# Patient Record
Sex: Female | Born: 1960 | Race: White | Smoking: Never smoker
Health system: Northeastern US, Academic
[De-identification: ages and names within clinical notes are randomized; demographics above are authoritative.]

## PROBLEM LIST (undated history)

## (undated) DIAGNOSIS — D649 Anemia, unspecified: Secondary | ICD-10-CM

## (undated) DIAGNOSIS — M1990 Unspecified osteoarthritis, unspecified site: Secondary | ICD-10-CM

## (undated) DIAGNOSIS — G4730 Sleep apnea, unspecified: Secondary | ICD-10-CM

## (undated) DIAGNOSIS — F419 Anxiety disorder, unspecified: Secondary | ICD-10-CM

## (undated) DIAGNOSIS — F32A Depression, unspecified: Secondary | ICD-10-CM

## (undated) DIAGNOSIS — K219 Gastro-esophageal reflux disease without esophagitis: Secondary | ICD-10-CM

## (undated) DIAGNOSIS — Z9989 Dependence on other enabling machines and devices: Secondary | ICD-10-CM

## (undated) DIAGNOSIS — K900 Celiac disease: Secondary | ICD-10-CM

## (undated) DIAGNOSIS — F319 Bipolar disorder, unspecified: Secondary | ICD-10-CM

## (undated) DIAGNOSIS — J189 Pneumonia, unspecified organism: Secondary | ICD-10-CM

## (undated) DIAGNOSIS — G43909 Migraine, unspecified, not intractable, without status migrainosus: Secondary | ICD-10-CM

## (undated) DIAGNOSIS — R87619 Unspecified abnormal cytological findings in specimens from cervix uteri: Secondary | ICD-10-CM

## (undated) DIAGNOSIS — F1910 Other psychoactive substance abuse, uncomplicated: Secondary | ICD-10-CM

## (undated) DIAGNOSIS — L709 Acne, unspecified: Secondary | ICD-10-CM

## (undated) DIAGNOSIS — K589 Irritable bowel syndrome without diarrhea: Secondary | ICD-10-CM

## (undated) DIAGNOSIS — G4700 Insomnia, unspecified: Secondary | ICD-10-CM

## (undated) HISTORY — DX: Unspecified abnormal cytological findings in specimens from cervix uteri: R87.619

## (undated) HISTORY — DX: Gastro-esophageal reflux disease without esophagitis: K21.9

## (undated) HISTORY — DX: Other psychoactive substance abuse, uncomplicated: F19.10

## (undated) HISTORY — DX: Bipolar disorder, unspecified: F31.9

## (undated) HISTORY — DX: Migraine, unspecified, not intractable, without status migrainosus: G43.909

## (undated) HISTORY — PX: BREAST ENHANCEMENT SURGERY: SHX7

## (undated) HISTORY — DX: Insomnia, unspecified: G47.00

## (undated) HISTORY — DX: Anemia, unspecified: D64.9

## (undated) HISTORY — DX: Irritable bowel syndrome, unspecified: K58.9

## (undated) HISTORY — DX: Anxiety disorder, unspecified: F41.9

## (undated) HISTORY — DX: Acne, unspecified: L70.9

## (undated) HISTORY — PX: BASAL CELL CARCINOMA EXCISION: SHX1214

## (undated) HISTORY — DX: Pneumonia, unspecified organism: J18.9

## (undated) HISTORY — DX: Depression, unspecified: F32.A

## (undated) HISTORY — DX: Celiac disease: K90.0

## (undated) HISTORY — PX: SPINE SURGERY: SHX786

## (undated) HISTORY — PX: EYE SURGERY: SHX253

## (undated) HISTORY — DX: Unspecified osteoarthritis, unspecified site: M19.90

---

## 1998-11-21 HISTORY — PX: BREAST ENHANCEMENT SURGERY: SHX7

## 2003-03-07 ENCOUNTER — Other Ambulatory Visit: Admission: RE | Admit: 2003-03-07 | Discharge: 2003-03-07 | Payer: Self-pay | Admitting: *Deleted

## 2004-03-28 ENCOUNTER — Other Ambulatory Visit: Admission: RE | Admit: 2004-03-28 | Discharge: 2004-03-28 | Payer: Self-pay | Admitting: *Deleted

## 2004-07-31 ENCOUNTER — Encounter: Admission: RE | Admit: 2004-07-31 | Discharge: 2004-07-31 | Payer: Self-pay | Admitting: *Deleted

## 2004-08-13 ENCOUNTER — Ambulatory Visit: Payer: Self-pay | Admitting: Licensed Clinical Social Worker

## 2004-08-18 ENCOUNTER — Ambulatory Visit: Payer: Self-pay | Admitting: Licensed Clinical Social Worker

## 2004-08-28 ENCOUNTER — Ambulatory Visit: Payer: Self-pay | Admitting: Licensed Clinical Social Worker

## 2004-09-01 ENCOUNTER — Ambulatory Visit: Payer: Self-pay | Admitting: Licensed Clinical Social Worker

## 2004-09-10 ENCOUNTER — Ambulatory Visit: Payer: Self-pay | Admitting: Licensed Clinical Social Worker

## 2004-09-11 ENCOUNTER — Ambulatory Visit: Payer: Self-pay | Admitting: Family Medicine

## 2004-09-15 ENCOUNTER — Ambulatory Visit: Payer: Self-pay | Admitting: Licensed Clinical Social Worker

## 2004-09-25 ENCOUNTER — Ambulatory Visit: Payer: Self-pay | Admitting: Licensed Clinical Social Worker

## 2004-10-06 ENCOUNTER — Ambulatory Visit: Payer: Self-pay | Admitting: Licensed Clinical Social Worker

## 2004-10-14 ENCOUNTER — Ambulatory Visit: Payer: Self-pay | Admitting: Licensed Clinical Social Worker

## 2004-10-20 ENCOUNTER — Ambulatory Visit: Payer: Self-pay | Admitting: Licensed Clinical Social Worker

## 2004-10-30 ENCOUNTER — Ambulatory Visit: Payer: Self-pay | Admitting: Licensed Clinical Social Worker

## 2004-11-05 ENCOUNTER — Ambulatory Visit: Payer: Self-pay | Admitting: Licensed Clinical Social Worker

## 2004-11-10 ENCOUNTER — Ambulatory Visit: Payer: Self-pay | Admitting: Licensed Clinical Social Worker

## 2004-11-10 ENCOUNTER — Ambulatory Visit: Payer: Self-pay | Admitting: Family Medicine

## 2004-11-13 ENCOUNTER — Ambulatory Visit: Payer: Self-pay | Admitting: Family Medicine

## 2004-11-13 ENCOUNTER — Ambulatory Visit: Payer: Self-pay | Admitting: Licensed Clinical Social Worker

## 2004-11-18 ENCOUNTER — Ambulatory Visit: Payer: Self-pay | Admitting: Licensed Clinical Social Worker

## 2004-11-20 ENCOUNTER — Ambulatory Visit: Payer: Self-pay | Admitting: Licensed Clinical Social Worker

## 2004-11-25 ENCOUNTER — Ambulatory Visit: Payer: Self-pay | Admitting: Licensed Clinical Social Worker

## 2004-12-02 ENCOUNTER — Ambulatory Visit: Payer: Self-pay | Admitting: Licensed Clinical Social Worker

## 2004-12-08 ENCOUNTER — Ambulatory Visit: Payer: Self-pay | Admitting: Licensed Clinical Social Worker

## 2004-12-16 ENCOUNTER — Ambulatory Visit: Payer: Self-pay | Admitting: Licensed Clinical Social Worker

## 2005-01-06 ENCOUNTER — Ambulatory Visit: Payer: Self-pay | Admitting: Licensed Clinical Social Worker

## 2005-01-15 ENCOUNTER — Ambulatory Visit: Payer: Self-pay | Admitting: Licensed Clinical Social Worker

## 2005-01-19 ENCOUNTER — Ambulatory Visit: Payer: Self-pay | Admitting: Family Medicine

## 2005-01-20 ENCOUNTER — Encounter: Admission: RE | Admit: 2005-01-20 | Discharge: 2005-01-20 | Payer: Self-pay | Admitting: Family Medicine

## 2005-01-26 ENCOUNTER — Ambulatory Visit: Payer: Self-pay | Admitting: Licensed Clinical Social Worker

## 2005-02-04 ENCOUNTER — Encounter: Admission: RE | Admit: 2005-02-04 | Discharge: 2005-02-04 | Payer: Self-pay | Admitting: Family Medicine

## 2005-02-12 ENCOUNTER — Ambulatory Visit: Payer: Self-pay | Admitting: Licensed Clinical Social Worker

## 2005-02-25 ENCOUNTER — Ambulatory Visit: Payer: Self-pay | Admitting: Family Medicine

## 2005-02-26 ENCOUNTER — Ambulatory Visit: Payer: Self-pay | Admitting: Licensed Clinical Social Worker

## 2005-04-02 ENCOUNTER — Ambulatory Visit: Payer: Self-pay | Admitting: Licensed Clinical Social Worker

## 2005-04-21 ENCOUNTER — Ambulatory Visit: Payer: Self-pay | Admitting: Family Medicine

## 2005-09-10 ENCOUNTER — Other Ambulatory Visit: Admission: RE | Admit: 2005-09-10 | Discharge: 2005-09-10 | Payer: Self-pay | Admitting: Obstetrics and Gynecology

## 2005-10-13 ENCOUNTER — Encounter: Admission: RE | Admit: 2005-10-13 | Discharge: 2005-10-13 | Payer: Self-pay | Admitting: Obstetrics & Gynecology

## 2005-12-09 ENCOUNTER — Ambulatory Visit: Payer: Self-pay | Admitting: Family Medicine

## 2005-12-11 ENCOUNTER — Encounter: Admission: RE | Admit: 2005-12-11 | Discharge: 2005-12-31 | Payer: Self-pay | Admitting: Family Medicine

## 2006-06-07 ENCOUNTER — Ambulatory Visit: Payer: Self-pay | Admitting: Family Medicine

## 2006-07-20 ENCOUNTER — Ambulatory Visit: Payer: Self-pay | Admitting: Family Medicine

## 2006-07-20 LAB — CONVERTED CEMR LAB
Basophils Relative: 0.6 % (ref 0.0–1.0)
CO2: 27 meq/L (ref 19–32)
Calcium: 9.3 mg/dL (ref 8.4–10.5)
Chloride: 109 meq/L (ref 96–112)
Eosinophils Absolute: 0.1 10*3/uL (ref 0.0–0.6)
GFR calc Af Amer: 78 mL/min
GFR calc non Af Amer: 64 mL/min
Glucose, Bld: 99 mg/dL (ref 70–99)
Hemoglobin: 10.5 g/dL — ABNORMAL LOW (ref 12.0–15.0)
Lymphocytes Relative: 33 % (ref 12.0–46.0)
MCV: 70.1 fL — ABNORMAL LOW (ref 78.0–100.0)
Platelets: 262 10*3/uL (ref 150–400)
RDW: 16.1 % — ABNORMAL HIGH (ref 11.5–14.6)
Sodium: 142 meq/L (ref 135–145)
WBC: 6.2 10*3/uL (ref 4.5–10.5)

## 2006-08-10 ENCOUNTER — Ambulatory Visit: Payer: Self-pay | Admitting: Family Medicine

## 2006-08-24 ENCOUNTER — Ambulatory Visit: Payer: Self-pay | Admitting: Gastroenterology

## 2006-09-07 ENCOUNTER — Ambulatory Visit: Payer: Self-pay | Admitting: Gastroenterology

## 2006-09-07 HISTORY — PX: ESOPHAGOGASTRODUODENOSCOPY: SHX1529

## 2007-03-29 DIAGNOSIS — F411 Generalized anxiety disorder: Secondary | ICD-10-CM | POA: Insufficient documentation

## 2007-03-29 DIAGNOSIS — F32A Depression, unspecified: Secondary | ICD-10-CM | POA: Insufficient documentation

## 2007-03-29 DIAGNOSIS — J45909 Unspecified asthma, uncomplicated: Secondary | ICD-10-CM | POA: Insufficient documentation

## 2007-03-29 DIAGNOSIS — F3289 Other specified depressive episodes: Secondary | ICD-10-CM | POA: Insufficient documentation

## 2007-03-29 DIAGNOSIS — D649 Anemia, unspecified: Secondary | ICD-10-CM

## 2007-03-29 DIAGNOSIS — F329 Major depressive disorder, single episode, unspecified: Secondary | ICD-10-CM

## 2007-03-29 DIAGNOSIS — J309 Allergic rhinitis, unspecified: Secondary | ICD-10-CM | POA: Insufficient documentation

## 2007-03-29 DIAGNOSIS — K219 Gastro-esophageal reflux disease without esophagitis: Secondary | ICD-10-CM

## 2007-05-10 ENCOUNTER — Ambulatory Visit: Payer: Self-pay | Admitting: Family Medicine

## 2007-05-10 LAB — CONVERTED CEMR LAB
ALT: 19 units/L (ref 0–35)
BUN: 15 mg/dL (ref 6–23)
Bilirubin, Direct: 0.1 mg/dL (ref 0.0–0.3)
CO2: 27 meq/L (ref 19–32)
Calcium: 8.8 mg/dL (ref 8.4–10.5)
Chloride: 104 meq/L (ref 96–112)
Creatinine, Ser: 0.8 mg/dL (ref 0.4–1.2)
Eosinophils Relative: 1.1 % (ref 0.0–5.0)
Hemoglobin: 11.9 g/dL — ABNORMAL LOW (ref 12.0–15.0)
LDL Cholesterol: 122 mg/dL — ABNORMAL HIGH (ref 0–99)
Lymphocytes Relative: 32.2 % (ref 12.0–46.0)
Monocytes Relative: 7.8 % (ref 3.0–11.0)
Neutro Abs: 3.2 10*3/uL (ref 1.4–7.7)
Platelets: 238 10*3/uL (ref 150–400)
Potassium: 4.2 meq/L (ref 3.5–5.1)
RBC: 4.23 M/uL (ref 3.87–5.11)
Sodium: 139 meq/L (ref 135–145)
Total Protein: 5.9 g/dL — ABNORMAL LOW (ref 6.0–8.3)
Triglycerides: 146 mg/dL (ref 0–149)

## 2007-05-24 ENCOUNTER — Ambulatory Visit: Payer: Self-pay | Admitting: Family Medicine

## 2007-06-02 ENCOUNTER — Telehealth: Payer: Self-pay | Admitting: Family Medicine

## 2007-07-25 ENCOUNTER — Other Ambulatory Visit: Admission: RE | Admit: 2007-07-25 | Discharge: 2007-07-25 | Payer: Self-pay | Admitting: Obstetrics and Gynecology

## 2007-09-07 ENCOUNTER — Ambulatory Visit: Payer: Self-pay | Admitting: Family Medicine

## 2007-09-07 DIAGNOSIS — L989 Disorder of the skin and subcutaneous tissue, unspecified: Secondary | ICD-10-CM | POA: Insufficient documentation

## 2008-03-23 ENCOUNTER — Telehealth: Payer: Self-pay | Admitting: Family Medicine

## 2008-07-24 ENCOUNTER — Ambulatory Visit: Payer: Self-pay | Admitting: Family Medicine

## 2008-07-24 DIAGNOSIS — J019 Acute sinusitis, unspecified: Secondary | ICD-10-CM

## 2008-07-24 DIAGNOSIS — D508 Other iron deficiency anemias: Secondary | ICD-10-CM | POA: Insufficient documentation

## 2008-08-06 ENCOUNTER — Other Ambulatory Visit: Admission: RE | Admit: 2008-08-06 | Discharge: 2008-08-06 | Payer: Self-pay | Admitting: Obstetrics and Gynecology

## 2008-10-03 ENCOUNTER — Telehealth: Payer: Self-pay | Admitting: Family Medicine

## 2008-10-26 ENCOUNTER — Ambulatory Visit: Payer: Self-pay | Admitting: Family Medicine

## 2008-10-26 DIAGNOSIS — L509 Urticaria, unspecified: Secondary | ICD-10-CM

## 2008-11-02 ENCOUNTER — Telehealth: Payer: Self-pay | Admitting: Family Medicine

## 2008-12-20 ENCOUNTER — Ambulatory Visit: Payer: Self-pay | Admitting: Family Medicine

## 2008-12-20 DIAGNOSIS — F909 Attention-deficit hyperactivity disorder, unspecified type: Secondary | ICD-10-CM | POA: Insufficient documentation

## 2008-12-20 DIAGNOSIS — R5383 Other fatigue: Secondary | ICD-10-CM

## 2008-12-20 DIAGNOSIS — R5381 Other malaise: Secondary | ICD-10-CM | POA: Insufficient documentation

## 2008-12-21 ENCOUNTER — Encounter: Payer: Self-pay | Admitting: Family Medicine

## 2008-12-21 ENCOUNTER — Telehealth: Payer: Self-pay | Admitting: Family Medicine

## 2008-12-21 LAB — CONVERTED CEMR LAB
Alkaline Phosphatase: 35 units/L — ABNORMAL LOW (ref 39–117)
BUN: 16 mg/dL (ref 6–23)
Basophils Absolute: 0 10*3/uL (ref 0.0–0.1)
Bilirubin, Direct: 0 mg/dL (ref 0.0–0.3)
CO2: 28 meq/L (ref 19–32)
Cholesterol: 163 mg/dL (ref 0–200)
Eosinophils Absolute: 0 10*3/uL (ref 0.0–0.7)
Eosinophils Relative: 0.6 % (ref 0.0–5.0)
GFR calc non Af Amer: 82.05 mL/min (ref >60)
Glucose, Bld: 104 mg/dL — ABNORMAL HIGH (ref 70–99)
HCT: 35.6 % — ABNORMAL LOW (ref 36.0–46.0)
Hemoglobin: 11.7 g/dL — ABNORMAL LOW (ref 12.0–15.0)
LDL Cholesterol: 81 mg/dL (ref 0–99)
Lymphs Abs: 1.4 10*3/uL (ref 0.7–4.0)
MCV: 83.6 fL (ref 78.0–100.0)
Neutro Abs: 4.6 10*3/uL (ref 1.4–7.7)
Platelets: 260 10*3/uL (ref 150.0–400.0)
Potassium: 4.3 meq/L (ref 3.5–5.1)
Total Bilirubin: 0.5 mg/dL (ref 0.3–1.2)
Total Protein: 6.1 g/dL (ref 6.0–8.3)
Vitamin B-12: 573 pg/mL (ref 211–911)
WBC: 6.4 10*3/uL (ref 4.5–10.5)

## 2009-04-02 ENCOUNTER — Telehealth: Payer: Self-pay | Admitting: Family Medicine

## 2009-04-02 ENCOUNTER — Ambulatory Visit: Payer: Self-pay | Admitting: Family Medicine

## 2009-04-02 DIAGNOSIS — M7020 Olecranon bursitis, unspecified elbow: Secondary | ICD-10-CM

## 2009-04-15 ENCOUNTER — Telehealth: Payer: Self-pay | Admitting: Family Medicine

## 2009-04-15 ENCOUNTER — Ambulatory Visit: Payer: Self-pay | Admitting: Family Medicine

## 2009-05-27 ENCOUNTER — Telehealth: Payer: Self-pay | Admitting: Family Medicine

## 2009-05-31 ENCOUNTER — Telehealth: Payer: Self-pay | Admitting: Family Medicine

## 2009-05-31 ENCOUNTER — Ambulatory Visit: Payer: Self-pay | Admitting: Family Medicine

## 2009-05-31 DIAGNOSIS — R609 Edema, unspecified: Secondary | ICD-10-CM

## 2009-06-03 ENCOUNTER — Telehealth: Payer: Self-pay | Admitting: Family Medicine

## 2009-06-03 LAB — CONVERTED CEMR LAB
Albumin: 3.9 g/dL (ref 3.5–5.2)
Alkaline Phosphatase: 54 units/L (ref 39–117)
Basophils Absolute: 0 10*3/uL (ref 0.0–0.1)
Bilirubin, Direct: <0.1 mg/dL (ref 0.0–0.3)
Calcium: 9.2 mg/dL (ref 8.4–10.5)
Creatinine, Ser: 0.85 mg/dL (ref 0.40–1.20)
Glucose, Bld: 103 mg/dL — ABNORMAL HIGH (ref 70–99)
HCT: 37.1 % (ref 36.0–46.0)
Lymphocytes Relative: 35 % (ref 12–46)
Lymphs Abs: 2.2 10*3/uL (ref 0.7–4.0)
MCHC: 32.1 g/dL (ref 30.0–36.0)
MCV: 80.8 fL (ref 78.0–100.0)
Monocytes Absolute: 0.5 10*3/uL (ref 0.1–1.0)
Monocytes Relative: 8 % (ref 3–12)
Neutro Abs: 3.5 10*3/uL (ref 1.7–7.7)
Neutrophils Relative %: 55 % (ref 43–77)
Platelets: 350 10*3/uL (ref 150–400)
RBC: 4.59 M/uL (ref 3.87–5.11)
RDW: 13.2 % (ref 11.5–15.5)
Total Bilirubin: 0.2 mg/dL — ABNORMAL LOW (ref 0.3–1.2)

## 2009-11-29 ENCOUNTER — Ambulatory Visit: Payer: Self-pay | Admitting: Internal Medicine

## 2010-01-24 ENCOUNTER — Telehealth: Payer: Self-pay | Admitting: Family Medicine

## 2010-03-18 ENCOUNTER — Ambulatory Visit: Payer: Self-pay | Admitting: Family Medicine

## 2010-03-18 DIAGNOSIS — J209 Acute bronchitis, unspecified: Secondary | ICD-10-CM | POA: Insufficient documentation

## 2010-03-19 ENCOUNTER — Ambulatory Visit: Payer: Self-pay | Admitting: Family Medicine

## 2010-03-19 ENCOUNTER — Telehealth: Payer: Self-pay | Admitting: Family Medicine

## 2010-07-22 NOTE — Assessment & Plan Note (Signed)
Summary: leg swelling/njr   for and if the and the  Vital Signs:  Patient profile:   50 year old female Weight:      141 pounds Temp:     98.0 degrees F oral BP sitting:   114 / 70  (left arm) Cuff size:   regular  Vitals Entered By: Cay Schillings LPN (November 30, 2991 7:16 PM) CC: c/o ??legs swelling?? Is Patient Diabetic? No   CC:  c/o ??legs swelling??.  History of Present Illness: 50 year old patient new in the past two weeks has had some lower extremity edema.  She had some of her issues in December and was treated with low-dose furosemide with benefit.  Denies any shortness of breath or any pulmonary complaints.  No real change in her sodium intake.  She has been lamictal chronically  Preventive Screening-Counseling & Management  Alcohol-Tobacco     Smoking Status: never  Allergies (verified): No Known Drug Allergies  Past History:  Past Medical History: Reviewed history from 05/31/2009 and no changes required. Allergic rhinitis Anxiety Asthma Depression, bipolar (sees Runner, broadcasting/film/video at Dr. Arvil Persons office and sees Trey Paula for therapy) GERD Anemia-NOS Allergies Herpes simplex constipation sees Dr. Jarome Matin for skin checks and acne  Physical Exam  General:  Well-developed,well-nourished,in no acute distress; alert,appropriate and cooperative throughout examination; low-pressure low normal Neck:  No deformities, masses, or tenderness noted. Lungs:  Normal respiratory effort, chest expands symmetrically. Lungs are clear to auscultation, no crackles or wheezes. Heart:  Normal rate and regular rhythm. S1 and S2 normal without gallop, murmur, click, rub or other extra sounds. Extremities:  according to the patient her calves are slightly more tight and tense.  No pitting edema noted.  No ankle or foot edema   Impression & Recommendations:  Problem # 1:  EDEMA (ICD-782.3)  The following medications were removed from the medication list:    Lasix 20  Mg Tabs (Furosemide) ..... Once daily as needed fluid Her updated medication list for this problem includes:    Furosemide 20 Mg Tabs (Furosemide) ..... One daily every morning, as needed for fluid control  Orders: Prescription Created Electronically [phone])  Complete Medication List: 1)  Lamictal 200 Mg Tabs (Lamotrigine) .... Once daily 2)  Biotin 5000 5 Mg Caps (Biotin) .... Qd 3)  Furosemide 20 Mg Tabs (Furosemide) .... One daily every morning, as needed for fluid control  Patient Instructions: 1)  Limit your Sodium (Salt). 2)  keep legs elevated as much as possible 3)  Furosemide once daily as needed Prescriptions: FUROSEMIDE 20 MG TABS (FUROSEMIDE) one daily every morning, as needed for fluid control  #50 x 0   Entered and Authorized by:   Marletta Lor  MD   Signed by:   Marletta Lor  MD on 11/29/2009   Method used:   Print then Give to Patient   RxID:   [id number] FUROSEMIDE 20 MG TABS (FUROSEMIDE) one daily every morning, as needed for fluid control  #50 x 0   Entered and Authorized by:   Marletta Lor  MD   Signed by:   Marletta Lor  MD on 11/29/2009   Method used:   Electronically to        [facility] [id number] (retail)       [address]       [place], [place]  [address]       Ph: [phone]       Fax: [phone]  RxID:   [id number]

## 2010-07-22 NOTE — Assessment & Plan Note (Signed)
Summary: not any better-requesting a shot//ccm   Vital Signs:  Patient profile:   50 year old female O2 Sat:      99 % Temp:     98.7 degrees F Pulse rate:   97 / minute  Vitals Entered By: Levora Angel, RN (March 19, 2010 9:46 AM) CC: itching all over   History of Present Illness: Here for intense diffuse itching all over the body. No visible rashes. She was seen here yesterday for a bronchitis, and in fact the itching had been going on for about 2 weeks. She started a Zpack yesterday (which she has had before), and in fact today her chest feels better and she is breathing better. However over night the itching became more severe, despite taking Benadryl. She has taken steroids several times before for hives like this.   Allergies (verified): No Known Drug Allergies  Past History:  Past Medical History: Reviewed history from 05/31/2009 and no changes required. Allergic rhinitis Anxiety Asthma Depression, bipolar (sees Runner, broadcasting/film/video at Dr. Arvil Persons office and sees Trey Paula for therapy) GERD Anemia-NOS Allergies Herpes simplex constipation sees Dr. Jarome Matin for skin checks and acne  Review of Systems  The patient denies anorexia, fever, weight loss, weight gain, vision loss, decreased hearing, hoarseness, chest pain, syncope, dyspnea on exertion, peripheral edema, prolonged cough, headaches, hemoptysis, abdominal pain, melena, hematochezia, severe indigestion/heartburn, hematuria, incontinence, genital sores, muscle weakness, suspicious skin lesions, transient blindness, difficulty walking, depression, unusual weight change, abnormal bleeding, enlarged lymph nodes, angioedema, breast masses, and testicular masses.    Physical Exam  General:  Well-developed,well-nourished,in no acute distress; alert,appropriate and cooperative throughout examination Neck:  No deformities, masses, or tenderness noted. Lungs:  Normal respiratory effort, chest expands symmetrically.  Lungs are clear to auscultation, no crackles or wheezes. Heart:  Normal rate and regular rhythm. S1 and S2 normal without gallop, murmur, click, rub or other extra sounds. Skin:  Intact without suspicious lesions or rashes   Impression & Recommendations:  Problem # 1:  URTICARIA (ICD-708.9)  Orders: Admin of Therapeutic Inj  intramuscular or subcutaneous (00938) Depo- Medrol 41m ([id number]  Problem # 2:  ACUTE BRONCHITIS (ICD-466.0)  Her updated medication list for this problem includes:    Zithromax Z-pak 250 Mg Tabs (Azithromycin) ..Marland Kitchen.. As directed    Hydromet 5-1.5 Mg/546mSyrp (Hydrocodone-homatropine) ...Marland Kitchen. 1 tsp q 4 hours as needed cough  Complete Medication List: 1)  Lamictal 200 Mg Tabs (Lamotrigine) .... Once daily 2)  Biotin 5000 5 Mg Caps (Biotin) .... Qd 3)  Furosemide 20 Mg Tabs (Furosemide) .... One daily every morning, as needed for fluid control 4)  Valtrex 1 Gm Tabs (Valacyclovir hcl) .... 1/2 tab two times a day as needed 5)  Zithromax Z-pak 250 Mg Tabs (Azithromycin) .... As directed 6)  Hydromet 5-1.5 Mg/3m38myrp (Hydrocodone-homatropine) ....Marland Kitchen1 tsp q 4 hours as needed cough 7)  Prednisone (pak) 10 Mg Tabs (Prednisone) .... As directed for 12 days  Patient Instructions: 1)  Finish out the ZpaMarriottiven a steroid shot today, and she will start a taper pack tomorrow.  2)  Please schedule a follow-up appointment as needed .  Prescriptions: PREDNISONE (PAK) 10 MG TABS (PREDNISONE) as directed for 12 days  #1 x 0   Entered and Authorized by:   SteLaurey Morale   Signed by:   SteLaurey Morale on 03/19/2010   Method used:   Electronically to        [id number] (retail)       [place], [place]  [address]       Ph: [phone]       Fax: [phone]   RxID:   [id number]    Medication Administration  Injection # 1:    Medication: Depo- Medrol 24m    Diagnosis: URTICARIA ([id number])    Route: IM    Site: RUOQ  gluteus    Exp Date: 09/2010    Lot #: [id number]    Mfr: Pharmacia    Comments: 120 mg given     Patient tolerated injection without complications    Given by: RLevora Angel RN (March 19, 2010 10:40 AM)  Orders Added: 1)  Est. Patient Level IV [[01484]2)  Admin of Therapeutic Inj  intramuscular or subcutaneous [96372] 3)  Depo- Medrol 490m[J[S3979]

## 2010-07-22 NOTE — Progress Notes (Signed)
Summary: generic Valtrex  Phone Note Call from Patient   Caller: Patient Call For: Laurey Morale MD Summary of Call: Asking for generic Valtrex #90 day supply to [facility]. [phone] Initial call taken by: Deanna Artis CMA,  January 24, 2010 10:01 AM  Follow-up for Phone Call        done Follow-up by: Laurey Morale MD,  January 24, 2010 1:30 PM    New/Updated Medications: VALTREX 1 GM TABS (VALACYCLOVIR HCL) 1/2 tab two times a day as needed Prescriptions: VALTREX 1 GM TABS (VALACYCLOVIR HCL) 1/2 tab two times a day as needed  #90 x 3   Entered and Authorized by:   Laurey Morale MD   Signed by:   Laurey Morale MD on 01/24/2010   Method used:   Print then Give to Patient   RxID:   [id number]

## 2010-07-22 NOTE — Progress Notes (Signed)
Summary: wants to see dr fry today  Phone Note Call from Patient Call back at Home Phone [phone]   Caller: Patient----live call Reason for Call: Talk to Nurse Summary of Call: please return her call. wants to see dr Sarajane Jews this morning. just saw him yesterday. been up all night. requesting a shot. Initial call taken by: Despina Arias,  March 19, 2010 8:10 AM  Follow-up for Phone Call        will come at 9:30 am per [facility]. Follow-up by: Despina Arias,  March 19, 2010 8:32 AM

## 2010-07-22 NOTE — Assessment & Plan Note (Signed)
Summary: bronchitis//ccm   Vital Signs:  Patient profile:   50 year old female Weight:      134 pounds Temp:     98.4 degrees F oral BP sitting:   100 / 60  (left arm) Cuff size:   regular  Vitals Entered By: Clearnce Sorrel CMA (March 18, 2010 10:42 AM) CC: Possible bronchitis, src Is Patient Diabetic? No   History of Present Illness: Here for one week of stuffy head, PND, chest tightness and coughing up green sputum. No fever.   Preventive Screening-Counseling & Management  Alcohol-Tobacco     Smoking Status: never  Current Medications (verified): 1)  Lamictal 200 Mg Tabs (Lamotrigine) .... Once Daily 2)  Biotin 5000 5 Mg Caps (Biotin) .... Qd 3)  Furosemide 20 Mg Tabs (Furosemide) .... One Daily Every Morning, As Needed For Fluid Control 4)  Valtrex 1 Gm Tabs (Valacyclovir Hcl) .... 1/2 Tab Two Times A Day As Needed  Allergies (verified): No Known Drug Allergies  Past History:  Past Medical History: Reviewed history from 05/31/2009 and no changes required. Allergic rhinitis Anxiety Asthma Depression, bipolar (sees Gala Murdoch at Dr. Arvil Persons office and sees Trey Paula for therapy) GERD Anemia-NOS Allergies Herpes simplex constipation sees Dr. Jarome Matin for skin checks and acne  Past Surgical History: Reviewed history from 09/07/2007 and no changes required. EGD 09-07-06 per Dr. 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 Amy Martinique  Review of Systems  The patient denies anorexia, fever, weight loss, weight gain, vision loss, decreased hearing, hoarseness, chest pain, syncope, dyspnea on exertion, peripheral edema, headaches, hemoptysis, abdominal pain, melena, hematochezia, severe indigestion/heartburn, hematuria, incontinence, genital sores, muscle weakness, suspicious skin lesions, transient blindness, difficulty walking, depression, unusual weight change, abnormal bleeding, enlarged lymph nodes,  angioedema, breast masses, and testicular masses.    Physical Exam  General:  Well-developed,well-nourished,in no acute distress; alert,appropriate and cooperative throughout examination Head:  Normocephalic and atraumatic without obvious abnormalities. No apparent alopecia or balding. Eyes:  No corneal or conjunctival inflammation noted. EOMI. Perrla. Funduscopic exam benign, without hemorrhages, exudates or papilledema. Vision grossly normal. Ears:  External ear exam shows no significant lesions or deformities.  Otoscopic examination reveals clear canals, tympanic membranes are intact bilaterally without bulging, retraction, inflammation or discharge. Hearing is grossly normal bilaterally. Nose:  External nasal examination shows no deformity or inflammation. Nasal mucosa are pink and moist without lesions or exudates. Mouth:  Oral mucosa and oropharynx without lesions or exudates.  Teeth in good repair. Neck:  No deformities, masses, or tenderness noted. Lungs:  Normal respiratory effort, chest expands symmetrically. Lungs are clear to auscultation, no crackles or wheezes. Heart:  Normal rate and regular rhythm. S1 and S2 normal without gallop, murmur, click, rub or other extra sounds.   Impression & Recommendations:  Problem # 1:  ACUTE BRONCHITIS (ICD-466.0)  Her updated medication list for this problem includes:    Zithromax Z-pak 250 Mg Tabs (Azithromycin) .Marland Kitchen... As directed    Hydromet 5-1.5 Mg/57m Syrp (Hydrocodone-homatropine) ..Marland Kitchen.. 1 tsp q 4 hours as needed cough  Complete Medication List: 1)  Lamictal 200 Mg Tabs (Lamotrigine) .... Once daily 2)  Biotin 5000 5 Mg Caps (Biotin) .... Qd 3)  Furosemide 20 Mg Tabs (Furosemide) .... One daily every morning, as needed for fluid control 4)  Valtrex 1 Gm Tabs (Valacyclovir hcl) .... 1/2 tab two times a day as needed 5)  Zithromax Z-pak 250 Mg Tabs (Azithromycin) .... As directed 6)  Hydromet 5-1.5 Mg/52m Syrp (Hydrocodone-homatropine)  ..Marland Kitchen. 1 tsp q 4 hours as needed cough  Patient Instructions: 1)  Please schedule a follow-up appointment as needed .  Prescriptions: HYDROMET 5-1.5 MG/5ML SYRP (HYDROCODONE-HOMATROPINE) 1 tsp q 4 hours as needed cough  #240 x 0   Entered and Authorized by:   SLaurey MoraleMD   Signed by:   SLaurey MoraleMD on 03/18/2010   Method used:   Print then Give to Patient   RxID:   [id number] 250 MG TABS (AZITHROMYCIN) as directed  #1 x 0   Entered and Authorized by:   SLaurey MoraleMD   Signed by:   SLaurey MoraleMD on 03/18/2010   Method used:   Print then Give to Patient   RxID:   [id number]

## 2010-09-16 ENCOUNTER — Ambulatory Visit (INDEPENDENT_AMBULATORY_CARE_PROVIDER_SITE_OTHER): Payer: PRIVATE HEALTH INSURANCE | Admitting: Family Medicine

## 2010-09-16 ENCOUNTER — Encounter: Payer: Self-pay | Admitting: Family Medicine

## 2010-09-16 DIAGNOSIS — L509 Urticaria, unspecified: Secondary | ICD-10-CM

## 2010-09-16 MED ORDER — METHYLPREDNISOLONE ACETATE 80 MG/ML IJ SUSP
120.0000 mg | Freq: Once | INTRAMUSCULAR | Status: AC
  Administered 2010-09-16: 120 mg via INTRAMUSCULAR

## 2010-09-16 NOTE — Progress Notes (Signed)
  Subjective:    Patient ID: Jill Mcintyre, female    DOB: February 01, 1961, 50 y.o.   MRN: [id number]  HPI Here for another episode of urticaria. She gets this once or twice a year. No rash is visible. No new meds or foods recently. Using an OTC antihistamine. No SOB or swelling.   Review of Systems  Constitutional: Negative.   HENT: Negative.   Respiratory: Negative.   Cardiovascular: Negative.   Skin: Negative.        Objective:   Physical Exam  Constitutional: She appears well-developed and well-nourished.  Pulmonary/Chest: Effort normal.  Skin: Skin is warm and dry. No rash noted. No erythema. No pallor.          Assessment & Plan:  Given a steroid shot as usual.

## 2010-09-23 ENCOUNTER — Other Ambulatory Visit: Payer: Self-pay | Admitting: Obstetrics and Gynecology

## 2010-09-23 DIAGNOSIS — Z1231 Encounter for screening mammogram for malignant neoplasm of breast: Secondary | ICD-10-CM

## 2010-10-13 ENCOUNTER — Ambulatory Visit: Payer: PRIVATE HEALTH INSURANCE

## 2010-10-27 ENCOUNTER — Ambulatory Visit
Admission: RE | Admit: 2010-10-27 | Discharge: 2010-10-27 | Disposition: A | Discharge status: Home / Self Care | Payer: PRIVATE HEALTH INSURANCE | Source: Ambulatory Visit | Attending: Obstetrics and Gynecology | Admitting: Obstetrics and Gynecology

## 2010-10-27 ENCOUNTER — Ambulatory Visit: Payer: PRIVATE HEALTH INSURANCE

## 2010-10-27 DIAGNOSIS — Z1231 Encounter for screening mammogram for malignant neoplasm of breast: Secondary | ICD-10-CM

## 2011-06-23 DIAGNOSIS — R87619 Unspecified abnormal cytological findings in specimens from cervix uteri: Secondary | ICD-10-CM

## 2011-06-23 HISTORY — DX: Unspecified abnormal cytological findings in specimens from cervix uteri: R87.619

## 2011-09-14 ENCOUNTER — Encounter: Payer: Self-pay | Admitting: Family Medicine

## 2011-09-14 ENCOUNTER — Ambulatory Visit (INDEPENDENT_AMBULATORY_CARE_PROVIDER_SITE_OTHER): Payer: 59 | Admitting: Family Medicine

## 2011-09-14 VITALS — BP 116/78 | HR 101 | Temp 98.9°F | Wt 138.0 lb

## 2011-09-14 DIAGNOSIS — M545 Low back pain: Secondary | ICD-10-CM

## 2011-09-14 DIAGNOSIS — J40 Bronchitis, not specified as acute or chronic: Secondary | ICD-10-CM

## 2011-09-14 MED ORDER — AZITHROMYCIN 250 MG PO TABS: ORAL_TABLET | ORAL | Status: DC

## 2011-09-14 MED ORDER — DICLOFENAC SODIUM 75 MG PO TBEC: 75.0000 mg | DELAYED_RELEASE_TABLET | Freq: Two times a day (BID) | ORAL | Status: AC | PRN

## 2011-09-14 NOTE — Progress Notes (Signed)
  Subjective:    Patient ID: Jill Mcintyre, female    DOB: [birth year], 51 y.o.   MRN: [id number]  HPI Here for one week of stuffy head, PND, chest tightness, and coughing up green sputum. No fever. Using Alka Seltzer Plus. Also she has developed some lower back pain again. In 2006 this was a real problem, and she ended up getting PT and cortisone shots. Now the pain is not as severe , but Advil does not help.    Review of Systems  Constitutional: Negative.   HENT: Positive for congestion, postnasal drip and sinus pressure.   Eyes: Negative.   Respiratory: Positive for cough and chest tightness.   Musculoskeletal: Positive for back pain.       Objective:   Physical Exam  Constitutional: She appears well-developed and well-nourished.  HENT:  Right Ear: External ear normal.  Left Ear: External ear normal.  Nose: Nose normal.  Mouth/Throat: Oropharynx is clear and moist. No oropharyngeal exudate.  Eyes: Conjunctivae are normal.  Neck: No thyromegaly present.  Pulmonary/Chest: Effort normal and breath sounds normal.  Lymphadenopathy:    She has no cervical adenopathy.          Assessment & Plan:  Treat the bronchitis with a Zpack. Try Diclofenac for the back pain. I suggested doing core exercises to strengthen her back. Off work today

## 2011-10-07 HISTORY — PX: COLPOSCOPY: SHX161

## 2011-11-17 ENCOUNTER — Telehealth: Payer: Self-pay | Admitting: Family Medicine

## 2011-11-17 NOTE — Telephone Encounter (Signed)
Was prescribed medication for leg and ankle swelling a year ago.  Has bursitis and is going out of town on Friday for a week.  Pt is requesting to have this same medication called .

## 2011-11-17 NOTE — Telephone Encounter (Signed)
Call in Lasix 20 mg daily prn fluid, #30 with 5 rf

## 2011-11-18 MED ORDER — FUROSEMIDE 20 MG PO TABS: 20.0000 mg | ORAL_TABLET | Freq: Every day | ORAL | Status: DC | PRN

## 2011-11-18 NOTE — Telephone Encounter (Signed)
I sent script e-scribe and spoke with pt. 

## 2011-12-22 ENCOUNTER — Telehealth: Payer: Self-pay | Admitting: Family Medicine

## 2011-12-22 NOTE — Telephone Encounter (Signed)
Pt called req to get refill of Diflucan 150 mg tab for yeast inf. [facility]. Pt req to be notified when this med has been called in to pharmacy.

## 2011-12-22 NOTE — Telephone Encounter (Signed)
I spoke with pt and she is going to call her GYN doctor, due to [name] being out of the office.

## 2012-01-06 ENCOUNTER — Other Ambulatory Visit (INDEPENDENT_AMBULATORY_CARE_PROVIDER_SITE_OTHER): Payer: 59

## 2012-01-06 DIAGNOSIS — Z0000 Encounter for general adult medical examination without abnormal findings: Secondary | ICD-10-CM

## 2012-01-06 LAB — POCT URINALYSIS DIPSTICK
Bilirubin, UA: NEGATIVE
Glucose, UA: NEGATIVE
Spec Grav, UA: 1.02
pH, UA: 5.5

## 2012-01-06 LAB — LIPID PANEL
Cholesterol: 169 mg/dL (ref 0–200)
VLDL: 21.8 mg/dL (ref 0.0–40.0)

## 2012-01-06 LAB — HEPATIC FUNCTION PANEL
ALT: 25 U/L (ref 0–35)
AST: 25 U/L (ref 0–37)
Total Bilirubin: 0.1 mg/dL — ABNORMAL LOW (ref 0.3–1.2)

## 2012-01-06 LAB — BASIC METABOLIC PANEL
Calcium: 8.6 mg/dL (ref 8.4–10.5)
Chloride: 106 mEq/L (ref 96–112)
Creatinine, Ser: 0.9 mg/dL (ref 0.4–1.2)
Glucose, Bld: 109 mg/dL — ABNORMAL HIGH (ref 70–99)

## 2012-01-06 LAB — TSH: TSH: 1.85 u[IU]/mL (ref 0.35–5.50)

## 2012-01-06 LAB — CBC WITH DIFFERENTIAL/PLATELET
Eosinophils Absolute: 0.1 10*3/uL (ref 0.0–0.7)
Eosinophils Relative: 0.7 % (ref 0.0–5.0)
HCT: 31.7 % — ABNORMAL LOW (ref 36.0–46.0)
Lymphocytes Relative: 27.2 % (ref 12.0–46.0)
Lymphs Abs: 1.9 10*3/uL (ref 0.7–4.0)
MCHC: 30.9 g/dL (ref 30.0–36.0)
MCV: 70.3 fl — ABNORMAL LOW (ref 78.0–100.0)
Monocytes Absolute: 0.4 10*3/uL (ref 0.1–1.0)
Platelets: 311 10*3/uL (ref 150.0–400.0)
RBC: 4.5 Mil/uL (ref 3.87–5.11)
WBC: 6.9 10*3/uL (ref 4.5–10.5)

## 2012-01-08 ENCOUNTER — Encounter: Payer: Self-pay | Admitting: Family Medicine

## 2012-01-08 NOTE — Progress Notes (Signed)
Quick Note:  I tried to reach pt by phone, no answer. I put a copy of results in mail. ______

## 2012-01-13 ENCOUNTER — Encounter: Payer: Self-pay | Admitting: Family Medicine

## 2012-01-13 ENCOUNTER — Ambulatory Visit (INDEPENDENT_AMBULATORY_CARE_PROVIDER_SITE_OTHER): Payer: 59 | Admitting: Family Medicine

## 2012-01-13 VITALS — BP 118/76 | HR 85 | Temp 98.8°F | Ht 68.0 in | Wt 141.0 lb

## 2012-01-13 DIAGNOSIS — Z0000 Encounter for general adult medical examination without abnormal findings: Secondary | ICD-10-CM

## 2012-01-13 MED ORDER — FERROUS SULFATE 325 (65 FE) MG PO TABS: 325.0000 mg | ORAL_TABLET | Freq: Every day | ORAL | Status: DC

## 2012-01-13 MED ORDER — MINOCYCLINE HCL 100 MG PO CAPS: 100.0000 mg | ORAL_CAPSULE | Freq: Every day | ORAL | Status: AC

## 2012-01-13 NOTE — Progress Notes (Signed)
  Subjective:    Patient ID: Jill Mcintyre, female    DOB: [birth year], 51 y.o.   MRN: [id number]  HPI 51 yr old female for a cpx. She feels great. She watches her diet and works out 3 days a week. She asks for help with mild facial acne.    Review of Systems  Constitutional: Negative.   HENT: Negative.   Eyes: Negative.   Respiratory: Negative.   Cardiovascular: Negative.   Gastrointestinal: Negative.   Genitourinary: Negative for dysuria, urgency, frequency, hematuria, flank pain, decreased urine volume, enuresis, difficulty urinating, pelvic pain and dyspareunia.  Musculoskeletal: Negative.   Skin: Negative.   Neurological: Negative.   Hematological: Negative.   Psychiatric/Behavioral: Negative.        Objective:   Physical Exam  Constitutional: She is oriented to person, place, and time. She appears well-developed and well-nourished. No distress.  HENT:  Head: Normocephalic and atraumatic.  Right Ear: External ear normal.  Left Ear: External ear normal.  Nose: Nose normal.  Mouth/Throat: Oropharynx is clear and moist. No oropharyngeal exudate.  Eyes: Conjunctivae and EOM are normal. Pupils are equal, round, and reactive to light. No scleral icterus.  Neck: Normal range of motion. Neck supple. No JVD present. No thyromegaly present.  Cardiovascular: Normal rate, regular rhythm, normal heart sounds and intact distal pulses.  Exam reveals no gallop and no friction rub.   No murmur heard. Pulmonary/Chest: Effort normal and breath sounds normal. No respiratory distress. She has no wheezes. She has no rales. She exhibits no tenderness.  Abdominal: Soft. Bowel sounds are normal. She exhibits no distension and no mass. There is no tenderness. There is no rebound and no guarding.  Musculoskeletal: Normal range of motion. She exhibits no edema and no tenderness.  Lymphadenopathy:    She has no cervical adenopathy.  Neurological: She is alert and oriented to person, place, and  time. She has normal reflexes. No cranial nerve deficit. She exhibits normal muscle tone. Coordination normal.  Skin: Skin is warm and dry. No rash noted. No erythema.  Psychiatric: She has a normal mood and affect. Her behavior is normal. Judgment and thought content normal.          Assessment & Plan:  Well exam. Start on iron supplements for the anemia. Reduce the carbohydrate intake. Recheck an A1c and a Hgb in 6 months . Start on Minocycline

## 2012-03-07 ENCOUNTER — Ambulatory Visit (INDEPENDENT_AMBULATORY_CARE_PROVIDER_SITE_OTHER): Payer: 59 | Admitting: Family Medicine

## 2012-03-07 ENCOUNTER — Encounter: Payer: Self-pay | Admitting: Family Medicine

## 2012-03-07 VITALS — BP 122/84 | HR 74 | Temp 98.7°F | Wt 141.0 lb

## 2012-03-07 DIAGNOSIS — Z9109 Other allergy status, other than to drugs and biological substances: Secondary | ICD-10-CM

## 2012-03-07 DIAGNOSIS — J329 Chronic sinusitis, unspecified: Secondary | ICD-10-CM

## 2012-03-07 DIAGNOSIS — J309 Allergic rhinitis, unspecified: Secondary | ICD-10-CM

## 2012-03-07 MED ORDER — METHYLPREDNISOLONE ACETATE 80 MG/ML IJ SUSP
120.0000 mg | Freq: Once | INTRAMUSCULAR | Status: AC
  Administered 2012-03-07: 120 mg via INTRAMUSCULAR

## 2012-03-07 MED ORDER — AZITHROMYCIN 250 MG PO TABS: ORAL_TABLET | ORAL | Status: DC

## 2012-03-07 NOTE — Addendum Note (Signed)
Addended by: Aggie Hacker A on: 03/07/2012 01:02 PM   Modules accepted: Orders

## 2012-03-07 NOTE — Progress Notes (Signed)
  Subjective:    Patient ID: Jill Mcintyre, female    DOB: 1960/10/11, 51 y.o.   MRN: [id number]  HPI Here for allergies and a possible sinus infection. For the past month she has had daily itchy eyes, runny nose, and sneezing despite taking Clortrimeton and Allegra. Now for one week she also has sinus pressure, PND, and a ST. No fever.    Review of Systems  Constitutional: Negative.   HENT: Positive for congestion, rhinorrhea, sneezing, postnasal drip and sinus pressure.   Eyes: Positive for itching. Negative for discharge and redness.  Respiratory: Negative.        Objective:   Physical Exam  Constitutional: She appears well-developed and well-nourished.  HENT:  Right Ear: External ear normal.  Left Ear: External ear normal.  Nose: Nose normal.  Mouth/Throat: Oropharynx is clear and moist. No oropharyngeal exudate.  Eyes: Conjunctivae normal are normal.  Neck: Neck supple. No thyromegaly present.  Pulmonary/Chest: Effort normal and breath sounds normal.  Lymphadenopathy:    She has no cervical adenopathy.          Assessment & Plan:  Given a Zpack and a steroid shot. Stay on Jill Mcintyre

## 2012-06-07 ENCOUNTER — Telehealth: Payer: Self-pay | Admitting: Family Medicine

## 2012-06-07 MED ORDER — LIDOCAINE HCL 2 % EX GEL: Freq: Three times a day (TID) | CUTANEOUS | Status: DC

## 2012-06-07 MED ORDER — TERCONAZOLE 0.4 % VA CREA: 1.0000 | TOPICAL_CREAM | Freq: Every day | VAGINAL | Status: DC

## 2012-06-07 NOTE — Telephone Encounter (Signed)
Pt informed

## 2012-06-07 NOTE — Telephone Encounter (Signed)
Tell her I have sent both of these in electronically

## 2012-06-07 NOTE — Telephone Encounter (Signed)
Pt needs 2 rx's - states she had these a long time ago, but not w/ you. She would like: Lidocaine numbing gel (goes w/ Valtrex), and an external, topical vaginal itch cream. Pt uses [facility]. Please advise if ok, or needs OV.

## 2012-06-17 ENCOUNTER — Telehealth: Payer: Self-pay | Admitting: Family Medicine

## 2012-06-17 MED ORDER — CIPROFLOXACIN HCL 500 MG PO TABS: 500.0000 mg | ORAL_TABLET | Freq: Two times a day (BID) | ORAL | Status: DC

## 2012-06-17 NOTE — Telephone Encounter (Signed)
Patient Information:  Caller Name: Keilee  Phone: [phone]  Patient: Jill Mcintyre, Jill Mcintyre  Gender: Female  DOB: [birth year]  Age: 51 Years  PCP: Alysia Penna [facility])  Pregnant: No  Office Follow Up:  Does the office need to follow up with this patient?: Yes  Instructions For The Office: Please f/u with patinet, she would like to have Rx called in for UTI symptoms.  RN Note:  Patient reports she had a UTI approx 6 weeks ago and now having the same symptoms.  Patient would like to know if Rx can be called in.  Patient is at work and unable to go to ED but will come to office for appt. If needed.    Symptoms  Reason For Call & Symptoms: Pt. states she is having urinary pain, frequency  Reviewed Health History In EMR: Yes  Reviewed Medications In EMR: Yes  Reviewed Allergies In EMR: Yes  Reviewed Surgeries / Procedures: Yes  Date of Onset of Symptoms: 06/17/2012 OB / GYN:  LMP: 05/18/2012  Guideline(s) Used:  Urination Pain - Female  Disposition Per Guideline:   Go to ED Now (or to Office with PCP Approval)  Reason For Disposition Reached:   Unable to urinate (or only a few drops) and bladder feels very full  Advice Given:  N/A  Patient Refused Recommendation:  Patient Requests Prescription  Patient would like to have Rx called in if possible.

## 2012-06-17 NOTE — Telephone Encounter (Signed)
Per [name], call in Cipro 500 mg take 1 po bid X 7 days. I spoke with pt and sent script e-scribe.

## 2012-09-13 ENCOUNTER — Ambulatory Visit (INDEPENDENT_AMBULATORY_CARE_PROVIDER_SITE_OTHER): Payer: 59 | Admitting: Family Medicine

## 2012-09-13 ENCOUNTER — Encounter: Payer: Self-pay | Admitting: Family Medicine

## 2012-09-13 VITALS — BP 110/60 | HR 86 | Temp 98.4°F | Wt 140.0 lb

## 2012-09-13 DIAGNOSIS — J209 Acute bronchitis, unspecified: Secondary | ICD-10-CM

## 2012-09-13 MED ORDER — HYDROCODONE-HOMATROPINE 5-1.5 MG/5ML PO SYRP: 5.0000 mL | ORAL_SOLUTION | ORAL | Status: DC | PRN

## 2012-09-13 MED ORDER — AZITHROMYCIN 250 MG PO TABS: ORAL_TABLET | ORAL | Status: AC

## 2012-09-13 NOTE — Progress Notes (Signed)
  Subjective:    Patient ID: Jill Mcintyre, female    DOB: 1961/04/21, 52 y.o.   MRN: [id number]  HPI Here for 6 days of chest tightness and coughing up green sputum. No fever.    Review of Systems  Constitutional: Negative.   HENT: Negative.   Eyes: Negative.   Respiratory: Positive for cough and chest tightness.        Objective:   Physical Exam  Constitutional: She appears well-developed and well-nourished.  HENT:  Right Ear: External ear normal.  Left Ear: External ear normal.  Nose: Nose normal.  Mouth/Throat: Oropharynx is clear and moist.  Eyes: Conjunctivae are normal.  Neck: No thyromegaly present.  Pulmonary/Chest: Effort normal. No respiratory distress. She has no wheezes. She has no rales.  Scattered rhonchi   Lymphadenopathy:    She has no cervical adenopathy.          Assessment & Plan:  Add Mucinex.

## 2012-09-20 ENCOUNTER — Encounter: Payer: Self-pay | Admitting: Certified Nurse Midwife

## 2012-09-26 ENCOUNTER — Ambulatory Visit: Payer: Self-pay | Admitting: Certified Nurse Midwife

## 2012-10-06 ENCOUNTER — Ambulatory Visit (INDEPENDENT_AMBULATORY_CARE_PROVIDER_SITE_OTHER): Payer: 59 | Admitting: Certified Nurse Midwife

## 2012-10-06 ENCOUNTER — Encounter: Payer: Self-pay | Admitting: Certified Nurse Midwife

## 2012-10-06 VITALS — BP 112/68 | Ht 68.0 in | Wt 140.0 lb

## 2012-10-06 DIAGNOSIS — B373 Candidiasis of vulva and vagina: Secondary | ICD-10-CM

## 2012-10-06 DIAGNOSIS — Z01419 Encounter for gynecological examination (general) (routine) without abnormal findings: Secondary | ICD-10-CM

## 2012-10-06 MED ORDER — NYSTATIN-TRIAMCINOLONE 100000-0.1 UNIT/GM-% EX OINT: TOPICAL_OINTMENT | Freq: Two times a day (BID) | CUTANEOUS | Status: DC

## 2012-10-06 NOTE — Progress Notes (Signed)
52 y.o. [place] female   [id number] here for annual exam.  Periods normal.  No problems.  Occasional HSV11 outbreak. Continues with Valtrex continual for suppression. Complaining of external vaginal itching after sexual activity for the past 3 months.  No increase in vaginal discharge or odor.  Also feel like skin is cracking at times. No thong use or new personal products.  Contraception working well. No other health issues today.  Patient's last menstrual period was 06/22/2012.          Sexually active: yes  The current method of family planning is OCP (estrogen/progesterone).    Exercising: yes  Gym/ health club routine includes cardio and walking on track . Last mammogram:10/27/10 normal Last pap: 04/05/12 neg Last BMD: 10/13/05 normal Alcohol: wine and beer 5 days a week Tobacco:quit smoking 30 years ago   Health Maintenance  Topic Date Due  . Pap Smear  11/20/1980  . Influenza Vaccine  02/20/2013  . Tetanus/tdap  10/28/2013    Family History  Problem Relation Age of Onset  . Alcohol abuse    . Arthritis    . Cancer      breast, ovrarian, uterine  . Cancer Mother     Patient Active Problem List  Diagnosis  . ANEMIA, IRON DEFICIENCY, MICROCYTIC  . ANEMIA-NOS  . ANXIETY  . DEPRESSION  . ADHD  . ACUTE SINUSITIS, UNSPECIFIED  . ACUTE BRONCHITIS  . ALLERGIC RHINITIS  . ASTHMA  . GERD  . URTICARIA  . SKIN LESION  . OLECRANON BURSITIS  . FATIGUE  . EDEMA    Past Medical History  Diagnosis Date  . ALLERGIC RHINITIS   . Anxiety   . Bipolar depression     sees Dr [facility] and sees Trey Paula for  therapy  . GERD (gastroesophageal reflux disease)   . Herpes simplex   . Constipation   . Anemia   . Acne     sees Dr. Jarome Matin for skin checks and acne  . HPV (human papilloma virus) infection   . Routine gynecological examination     sees Dr. Caren Griffins [place]   . IBS (irritable bowel syndrome)   . Bipolar 1 disorder   .  Insomnia   . Migraines     Past Surgical History  Procedure Laterality Date  . Esophagogastroduodenoscopy  09/07/2006    Dr Ardis Hughs  . Breast enhancement surgery    . Basal cell carcinoma excision      Dr Amy Martinique  . Colposcopy    . Cesarean section  1996    Allergies: Ciprofloxacin and Sulfa antibiotics  Current Outpatient Prescriptions  Medication Sig Dispense Refill  . B Complex Vitamins (B COMPLEX PO) Take by mouth daily.       . Biotin (BIOTIN 5000) 5 MG CAPS Take 1,000 mg by mouth daily.       . Cholecalciferol (VITAMIN D PO) Take 1,000 Units by mouth daily.       . drospirenone-ethinyl estradiol (YAZ,GIANVI,LORYNA) 3-0.02 MG tablet Take 1 tablet by mouth daily.      . furosemide (LASIX) 20 MG tablet Take 1 tablet (20 mg total) by mouth daily as needed.  30 tablet  5  . lamoTRIgine (LAMICTAL) 100 MG tablet Take 50 mg by mouth daily.       Marland Kitchen lidocaine (XYLOCAINE JELLY) 2 % jelly Apply topically 3 (three) times daily.  30 mL  11  . Multiple Vitamin (MULTI-VITAMIN PO) Take by mouth daily.      Marland Kitchen  Probiotic Product (PROBIOTIC DAILY PO) Take by mouth daily.      Marland Kitchen terconazole (TERAZOL 7) 0.4 % vaginal cream Place 1 applicator vaginally at bedtime.  45 g  11  . valACYclovir (VALTREX) 1000 MG tablet Take 500 mg by mouth daily.        No current facility-administered medications for this visit.    ROS: Pertinent items are noted in HPI.  Exam:    BP 112/68  Ht 5' 8"  (1.727 m)  Wt 140 lb (63.504 kg)  BMI 21.29 kg/m2  LMP 06/22/2012 Weight change: @WEIGHTCHANGE @ Last 3 height recordings:  Ht Readings from Last 3 Encounters:  10/06/12 5' 8"  (1.727 m)  01/13/12 5' 8"  (1.727 m)  05/24/07 5' 8"  (1.727 m)   General appearance: alert and cooperative Head: Normocephalic, without obvious abnormality, atraumatic Neck: no adenopathy, supple, symmetrical, trachea midline and thyroid not enlarged, symmetric, no tenderness/mass/nodules Lungs: clear to auscultation  bilaterally Breasts: normal appearance, no masses or tenderness, No nipple retraction or dimpling Heart: regular rate and rhythm Abdomen: soft, non-tender; bowel sounds normal; no masses,  no organomegaly Extremities: extremities normal, atraumatic, no cyanosis or edema Skin: Skin color, texture, turgor normal. No rashes or lesions Lymph nodes: Cervical, supraclavicular, and axillary nodes normal. no inguinal nodes palpated Neurologic: Alert and oriented X 3, normal strength and tone. Normal symmetric reflexes. Normal coordination and gait   Pelvic: External genitalia: Increased pink but no lesions and  Small cracked vs superficial laceration noted at introitus  Estrace cream applied to area.              Urethra: normal              Bartholins and Skenes: Bartholin's, Urethra, Skene's normal                 Vagina: normal appearing vagina with normal color and discharge, no lesions wet prep taken of vagina and external genital area              Cervix: normal appearance and thin prep PAP obtained              Pap taken: yes        Bimanual Exam:  Uterus:  uterus is normal size, shape, consistency and nontender                                      Adnexa:    normal adnexa in size, nontender and no masses and no masses                                      Rectovaginal: Confirms                                      Anus:  normal sphincter tone, no lesions  A: Well woman Exam Yeast Dermatitis   Wet prep yeast external only Contraception: OCP  History of abnormal pap, CIN1 2nd pap after Colpo 1st pap negative     P:Reviewed health and wellness pertinent to exam Mammogram yearly Reviewed findings, Rx Mycolog cream see order   Aveeno sitz bath prn comfort Rx Yaz see order Repeat pap in one year if normal or as indicated   return annually or  prn      An After Visit Summary was printed and given to the patient.   Reviewed, TL

## 2012-10-06 NOTE — Patient Instructions (Signed)

## 2012-10-10 ENCOUNTER — Encounter: Payer: Self-pay | Admitting: Certified Nurse Midwife

## 2012-10-11 ENCOUNTER — Telehealth: Payer: Self-pay | Admitting: Certified Nurse Midwife

## 2012-10-11 NOTE — Telephone Encounter (Signed)
Patient states we were supposed to call in an rx for a steroid cream at last visit to the [facility] [address]/pt states pharmacy never got the rx/[place]

## 2012-10-11 NOTE — Telephone Encounter (Signed)
Please advise 

## 2012-10-12 LAB — IPS PAP TEST WITH REFLEX TO HPV

## 2012-10-12 NOTE — Telephone Encounter (Signed)
I reviewed the chart and it was sent on the day she was here.  It was Mycolog cream,  Can you check with pharmacy to See what the issue is?

## 2012-10-12 NOTE — Telephone Encounter (Signed)
AJ from [facility] verified pt received a Rx for Mycolog 30 gram tube on 10/07/2012.

## 2012-10-21 ENCOUNTER — Telehealth: Payer: Self-pay | Admitting: Certified Nurse Midwife

## 2012-10-21 NOTE — Telephone Encounter (Signed)
Pt is aware of Mycolog Rx waiting for her at [facility]. Pt states she didn't hear Jackelyn Poling say at last visit that Rx would be sent in for her. Pt will pick up Rx today from pharmacy.

## 2012-10-21 NOTE — Telephone Encounter (Signed)
[facility] [phone] Vaginal cream with steriod not called in from last visit two weeks ago?/patient still complaining of itching/please call in ASAP/[place]

## 2012-11-01 ENCOUNTER — Telehealth: Payer: Self-pay | Admitting: Family Medicine

## 2012-11-01 MED ORDER — DICLOFENAC SODIUM 75 MG PO TBEC: 75.0000 mg | DELAYED_RELEASE_TABLET | Freq: Two times a day (BID) | ORAL | Status: DC | PRN

## 2012-11-01 NOTE — Telephone Encounter (Signed)
I sent script e-scribe. 

## 2012-11-01 NOTE — Telephone Encounter (Signed)
Pt needs refill on diclofenac 75 mg twice a day #[facility] call [facility] [address]

## 2012-11-11 ENCOUNTER — Other Ambulatory Visit: Payer: Self-pay | Admitting: Certified Nurse Midwife

## 2012-11-17 ENCOUNTER — Telehealth: Payer: Self-pay | Admitting: *Deleted

## 2012-11-17 NOTE — Telephone Encounter (Signed)
PLEASE FILL OUT PA FORM FOR MEDICATION./SUE ON YOUR DESK IN FOLDER.

## 2012-11-22 ENCOUNTER — Telehealth: Payer: Self-pay | Admitting: *Deleted

## 2012-11-22 NOTE — Telephone Encounter (Signed)
Prior Auth form faxed to [facility].

## 2012-11-22 NOTE — Telephone Encounter (Signed)
Will need chart

## 2012-11-24 ENCOUNTER — Telehealth: Payer: Self-pay | Admitting: *Deleted

## 2012-11-24 NOTE — Telephone Encounter (Signed)
LEFT MESSAGE ON PATIENT IDENTIFIED CB# OF APPROVAL BY INSURANCE OF MEDICATION [employer]. APPROVAL FAXED TO PHARMACY [facility]. SUE

## 2013-01-31 ENCOUNTER — Ambulatory Visit (INDEPENDENT_AMBULATORY_CARE_PROVIDER_SITE_OTHER): Payer: BC Managed Care – PPO | Admitting: Family Medicine

## 2013-01-31 ENCOUNTER — Encounter: Payer: Self-pay | Admitting: Family Medicine

## 2013-01-31 VITALS — BP 90/64 | Temp 98.3°F | Wt 139.0 lb

## 2013-01-31 DIAGNOSIS — K219 Gastro-esophageal reflux disease without esophagitis: Secondary | ICD-10-CM

## 2013-01-31 MED ORDER — OMEPRAZOLE 40 MG PO CPDR: 40.0000 mg | DELAYED_RELEASE_CAPSULE | Freq: Every day | ORAL | Status: DC

## 2013-01-31 NOTE — Patient Instructions (Addendum)
-  increased omeprazole to 49m daily  Diet for Gastroesophageal Reflux Disease, Adult Reflux (acid reflux) is when acid from your stomach flows up into the esophagus. When acid comes in contact with the esophagus, the acid causes irritation and soreness (inflammation) in the esophagus. When reflux happens often or so severely that it causes damage to the esophagus, it is called gastroesophageal reflux disease (GERD). Nutrition therapy can help ease the discomfort of GERD. FOODS OR DRINKS TO AVOID OR LIMIT  Smoking or chewing tobacco. Nicotine is one of the most potent stimulants to acid production in the gastrointestinal tract.  Caffeinated and decaffeinated coffee and black tea.  Regular or low-calorie carbonated beverages or energy drinks (caffeine-free carbonated beverages are allowed).   Strong spices, such as black pepper, white pepper, red pepper, cayenne, curry powder, and chili powder.  Peppermint or spearmint.  Chocolate.  High-fat foods, including meats and fried foods. Extra added fats including oils, butter, salad dressings, and nuts. Limit these to less than 8 tsp per day.  Fruits and vegetables if they are not tolerated, such as citrus fruits or tomatoes.  Alcohol.  Any food that seems to aggravate your condition. If you have questions regarding your diet, call your caregiver or a registered dietitian. OTHER THINGS THAT MAY HELP GERD INCLUDE:   Eating your meals slowly, in a relaxed setting.  Eating 5 to 6 small meals per day instead of 3 large meals.  Eliminating food for a period of time if it causes distress.  Not lying down until 3 hours after eating a meal.  Keeping the head of your bed raised 6 to 9 inches (15 to 23 cm) by using a foam wedge or blocks under the legs of the bed. Lying flat may make symptoms worse.  Being physically active. Weight loss may be helpful in reducing reflux in overweight or obese adults.  Wear loose fitting clothing EXAMPLE MEAL  PLAN This meal plan is approximately 2,000 calories based on [email or web address].humeal planning guidelines. Breakfast   cup cooked oatmeal.  1 cup strawberries.  1 cup low-fat milk.  1 oz almonds. Snack  1 cup cucumber slices.  6 oz yogurt (made from low-fat or fat-free milk). Lunch  2 slice whole-wheat bread.  2 oz sliced [place]  2 tsp mayonnaise.  1 cup blueberries.  1 cup snap peas. Snack  6 whole-wheat crackers.  1 oz string cheese. Dinner   cup brown rice.  1 cup mixed veggies.  1 tsp olive oil.  3 oz grilled fish. Document Released: 06/08/2005 Document Revised: 08/31/2011 Document Reviewed: 04/24/2011 [facility]atient Information 2014 [place]

## 2013-01-31 NOTE — Progress Notes (Signed)
Chief Complaint  Patient presents with  . Gastrophageal Reflux    getting worse; pt cannot sleep at night     HPI:  Jill Mcintyre, a 1 pt of [name] with PMH GERD, IBS, anxiety, depression, bipolar disorder and constipation is here for an acute visit for GERD: -had EGD in 2006, told GERD and made lifestyle changes -heartburn at night and when drinks coffee -she is taking prilosec 42m daily, but symptoms worse over the last few months -denies: weight loss, fevers, diarrhea, change in bowels, dysphagia, dysphonia, melena, hematochezia  ROS: See pertinent positives and negatives per HPI.  Past Medical History  Diagnosis Date  . ALLERGIC RHINITIS   . Anxiety   . Bipolar depression     [facility] and sees LTrey Paulafor  therapy  . GERD (gastroesophageal reflux disease)   . Herpes simplex   . Constipation   . Anemia   . Acne     sees Dr. DJarome Matinfor skin checks and acne  . HPV (human papilloma virus) infection   . Routine gynecological examination     sees Dr. CCaren GriffinsRomine   . IBS (irritable bowel syndrome)   . Bipolar 1 disorder   . Insomnia   . Migraines     Family History  Problem Relation Age of Onset  . Alcohol abuse    . Arthritis    . Cancer      breast, ovrarian, uterine  . Cancer Mother     History   Social History  . Marital Status: Divorced    Spouse Name: N/A    Number of Children: N/A  . Years of Education: N/A   Social History Main Topics  . Smoking status: Former Smoker    Start date: 10/07/1982  . Smokeless tobacco: Never Used  . Alcohol Use: 2.5 oz/week    5 drink(s) per week     Comment: 5-6 times a week wine and beer  . Drug Use: No  . Sexually Active: Yes -- Female partner(s)    Birth Control/ Protection: Pill     Comment: partner vasectomy   Other Topics Concern  . None   Social History Narrative  . None    Current outpatient prescriptions:B Complex Vitamins (B COMPLEX PO), Take by  mouth daily. , Disp: , Rfl: ;  Biotin (BIOTIN 5000) 5 MG CAPS, Take 1,000 mg by mouth daily. , Disp: , Rfl: ;  Cholecalciferol (VITAMIN D PO), Take 1,000 Units by mouth daily. , Disp: , Rfl: ;  diclofenac (VOLTAREN) 75 MG EC tablet, Take 1 tablet (75 mg total) by mouth 2 (two) times daily as needed., Disp: 60 tablet, Rfl: 2 drospirenone-ethinyl estradiol (YAZ,GIANVI,LORYNA) 3-0.02 MG tablet, Take one tablet by mouth daily, Disp: 3 Package, Rfl: 3;  lamoTRIgine (LAMICTAL) 100 MG tablet, Take 50 mg by mouth daily. , Disp: , Rfl: ;  lidocaine (XYLOCAINE JELLY) 2 % jelly, Apply topically 3 (three) times daily., Disp: 30 mL, Rfl: 11;  Multiple Vitamin (MULTI-VITAMIN PO), Take by mouth daily., Disp: , Rfl:  nystatin-triamcinolone ointment (MYCOLOG), Apply topically 2 (two) times daily., Disp: 30 g, Rfl: 1;  Probiotic Product (PROBIOTIC DAILY PO), Take by mouth daily., Disp: , Rfl: ;  terconazole (TERAZOL 7) 0.4 % vaginal cream, Place 1 applicator vaginally at bedtime., Disp: 45 g, Rfl: 11;  valACYclovir (VALTREX) 1000 MG tablet, Take 500 mg by mouth daily. , Disp: , Rfl:  furosemide (LASIX) 20 MG tablet,  Take 1 tablet (20 mg total) by mouth daily as needed., Disp: 30 tablet, Rfl: 5;  omeprazole (PRILOSEC) 40 MG capsule, Take 1 capsule (40 mg total) by mouth daily., Disp: 30 capsule, Rfl: 2  EXAM:  Filed Vitals:   01/31/13 0809  BP: 90/64  Temp: 98.3 F (36.8 C)    Body mass index is 21.14 kg/(m^2).  GENERAL: vitals reviewed and listed above, alert, oriented, appears well hydrated and in no acute distress  HEENT: atraumatic, conjunttiva clear, no obvious abnormalities on inspection of external nose and ears  NECK: no obvious masses on inspection  LUNGS: clear to auscultation bilaterally, no wheezes, rales or rhonchi, good air movement  ABD: BS+, soft, NTTP, no rebound to guarding  CV: HRRR, no peripheral edema  MS: moves all extremities without noticeable abnormality  PSYCH: pleasant and  cooperative, no obvious depression or anxiety  ASSESSMENT AND PLAN:  Discussed the following assessment and plan:  GERD (gastroesophageal reflux disease) - Plan: omeprazole (PRILOSEC) 40 MG capsule  -worsening GERD, admits diet/lifestyle not as good - she will work on this and we will increase her PPI -advised she follow up with PCP in 1 month to reassess -Patient advised to return or notify a doctor immediately if symptoms worsen or persist or new concerns arise.  Patient Instructions  -increased omeprazole to 7m daily  Diet for Gastroesophageal Reflux Disease, Adult Reflux (acid reflux) is when acid from your stomach flows up into the esophagus. When acid comes in contact with the esophagus, the acid causes irritation and soreness (inflammation) in the esophagus. When reflux happens often or so severely that it causes damage to the esophagus, it is called gastroesophageal reflux disease (GERD). Nutrition therapy can help ease the discomfort of GERD. FOODS OR DRINKS TO AVOID OR LIMIT  Smoking or chewing tobacco. Nicotine is one of the most potent stimulants to acid production in the gastrointestinal tract.  Caffeinated and decaffeinated coffee and black tea.  Regular or low-calorie carbonated beverages or energy drinks (caffeine-free carbonated beverages are allowed).   Strong spices, such as black pepper, white pepper, red pepper, cayenne, curry powder, and chili powder.  Peppermint or spearmint.  Chocolate.  High-fat foods, including meats and fried foods. Extra added fats including oils, butter, salad dressings, and nuts. Limit these to less than 8 tsp per day.  Fruits and vegetables if they are not tolerated, such as citrus fruits or tomatoes.  Alcohol.  Any food that seems to aggravate your condition. If you have questions regarding your diet, call your caregiver or a registered dietitian. OTHER THINGS THAT MAY HELP GERD INCLUDE:   Eating your meals slowly, in a  relaxed setting.  Eating 5 to 6 small meals per day instead of 3 large meals.  Eliminating food for a period of time if it causes distress.  Not lying down until 3 hours after eating a meal.  Keeping the head of your bed raised 6 to 9 inches (15 to 23 cm) by using a foam wedge or blocks under the legs of the bed. Lying flat may make symptoms worse.  Being physically active. Weight loss may be helpful in reducing reflux in overweight or obese adults.  Wear loose fitting clothing EXAMPLE MEAL PLAN This meal plan is approximately 2,000 calories based on [email or web address].humeal planning guidelines. Breakfast   cup cooked oatmeal.  1 cup strawberries.  1 cup low-fat milk.  1 oz almonds. Snack  1 cup cucumber slices.  6 oz yogurt (made from low-fat  or fat-free milk). Lunch  2 slice whole-wheat bread.  2 oz sliced [place].  2 tsp mayonnaise.  1 cup blueberries.  1 cup snap peas. Snack  6 whole-wheat crackers.  1 oz string cheese. Dinner   cup brown rice.  1 cup mixed veggies.  1 tsp olive oil.  3 oz grilled fish. Document Released: 06/08/2005 Document Revised: 08/31/2011 Document Reviewed: 04/24/2011 [facility] Patient Information 2014 [place], [place], [place]

## 2013-02-07 ENCOUNTER — Telehealth: Payer: Self-pay | Admitting: *Deleted

## 2013-02-07 MED ORDER — VALACYCLOVIR HCL 1 G PO TABS: 500.0000 mg | ORAL_TABLET | Freq: Every day | ORAL | Status: DC

## 2013-02-07 NOTE — Telephone Encounter (Signed)
Rx refill for Valtrex (via fax-due to pt's insurance changing)   Valtrex 500 mg #90/0 refills sent to pt's pharmacy-[facility]. Pt is aware.

## 2013-02-21 ENCOUNTER — Telehealth: Payer: Self-pay | Admitting: *Deleted

## 2013-02-21 NOTE — Telephone Encounter (Signed)
Fax From: [facility] for New Rx request Valtrex 500 mg  Last Refilled: 02/07/13 Valtrex 1,000 mg #90/0rfs was sent to local pharmacy [facility] Aex: 10/06/12   Okay to refill x 1 year? Please advise.

## 2013-02-21 NOTE — Telephone Encounter (Signed)
yes

## 2013-02-22 ENCOUNTER — Telehealth: Payer: Self-pay | Admitting: *Deleted

## 2013-02-22 MED ORDER — VALACYCLOVIR HCL 500 MG PO TABS: 500.0000 mg | ORAL_TABLET | Freq: Every day | ORAL | Status: DC

## 2013-02-22 MED ORDER — VALACYCLOVIR HCL 500 MG PO TABS: 500.0000 mg | ORAL_TABLET | Freq: Two times a day (BID) | ORAL | Status: DC

## 2013-02-22 NOTE — Telephone Encounter (Signed)
Fax from patient requesting call back regarding Valtrex RX that she requested on 02/07/13.  Advised pt that RX was sent to [facility] earlier today from a request we received from them.  She is appreciative.  Apologized for our error two weeks ago when we sent RX to local pharmacy instead of mail order.

## 2013-02-22 NOTE — Telephone Encounter (Signed)
Valtrex 500 mg #90/3 rf's sent in to [facility].

## 2013-02-24 ENCOUNTER — Other Ambulatory Visit: Payer: Self-pay | Admitting: Nurse Practitioner

## 2013-03-27 ENCOUNTER — Ambulatory Visit (INDEPENDENT_AMBULATORY_CARE_PROVIDER_SITE_OTHER): Payer: BC Managed Care – PPO | Admitting: Certified Nurse Midwife

## 2013-03-27 ENCOUNTER — Encounter: Payer: Self-pay | Admitting: Certified Nurse Midwife

## 2013-03-27 VITALS — BP 120/70 | HR 84 | Resp 18 | Wt 133.0 lb

## 2013-03-27 DIAGNOSIS — Z202 Contact with and (suspected) exposure to infections with a predominantly sexual mode of transmission: Secondary | ICD-10-CM

## 2013-03-27 LAB — HIV-1/2  ANTIGEN/ANTIBODY SCREEN WITH CONFIRMATION: HIV 1/2 AG/AB Screeninterp: NONREACTIVE

## 2013-03-27 LAB — HEPATITIS C ANTIBODY: Hepatitis C Ab: NEGATIVE

## 2013-03-27 NOTE — Progress Notes (Signed)
  31 yrs Divorced Caucasian female [id number] here for questionable STD exposure. sexual contact with individual with uncertain background 7 days ago. Patient has with this partner for 2 years and has recently found out he has been on the dating web site as another age and has had / number of partners, 2 known. She confronted partner and he admitted to unprotected sexual contact with other partners during their relationship. Patient's last menstrual period was 03/26/2013.                                                                      Contraception:The current method of family planning is OCP (estrogen/progesterone). Patient denies abnormal vaginal bleeding, discharge or unusual pelvic pain, no dysuria, frequency or hematuria. Denies new personal products.                                          General:Healthy female WDWN Affect: normal, orientation X 3  Abdomen: soft non-tender Lymph groin:Normal no enlargement   Pelvic Exam:EG:BUS normal. Vulva reveals no erythema, lesions or atrophic changes. Vagina normal, no lesions,  or discharge, on menses.  Cervix is normal, smooth pink mucosa, no friability, nontender, no CMT, no lesions, no polyps.  Uterusnon tender:, normal size, shape Adnexae:non tender, no masses Perineal area: no lesions noted Rectal: no lesions noted Specimens collected                    Assessment: Normal Pelvic Exam               Contraception:OCP STD Screening Patient has hx of HSV and HPV on pap     Plan :Reviewed findings of Normal pelvic exam GC,Chl,HIV RPR ,Hep. C           STD prevention reviewed.  Questions addressed.         Repeat  GC/Chlamydia screen 3 months. Repeat HIV, RPR, Hep.C at aex.           RV as above, prn   :   :      :       Red Flags:  Missed period:  Recent antibiotics:  Possible STD exposure:  IUD:  Diabetes:

## 2013-03-28 NOTE — Progress Notes (Signed)
Note reviewed, agree with plan.  Aniceto Kyser, MD  

## 2013-03-29 LAB — RPR

## 2013-03-30 LAB — IPS N GONORRHOEA AND CHLAMYDIA BY PCR

## 2013-05-01 ENCOUNTER — Other Ambulatory Visit: Payer: Self-pay | Admitting: Family Medicine

## 2013-05-03 ENCOUNTER — Ambulatory Visit (INDEPENDENT_AMBULATORY_CARE_PROVIDER_SITE_OTHER): Payer: BC Managed Care – PPO | Admitting: Family Medicine

## 2013-05-03 ENCOUNTER — Encounter: Payer: Self-pay | Admitting: Family Medicine

## 2013-05-03 VITALS — BP 106/68 | HR 94 | Temp 98.9°F | Wt 136.0 lb

## 2013-05-03 DIAGNOSIS — J019 Acute sinusitis, unspecified: Secondary | ICD-10-CM

## 2013-05-03 MED ORDER — AZITHROMYCIN 250 MG PO TABS: ORAL_TABLET | ORAL | Status: DC

## 2013-05-03 MED ORDER — OMEPRAZOLE 40 MG PO CPDR: DELAYED_RELEASE_CAPSULE | ORAL | Status: DC

## 2013-05-03 NOTE — Progress Notes (Signed)
Pre visit review using our clinic review tool, if applicable. No additional management support is needed unless otherwise documented below in the visit note. 

## 2013-05-03 NOTE — Progress Notes (Signed)
  Subjective:    Patient ID: Jill Mcintyre, female    DOB: July 04, 1960, 52 y.o.   MRN: [id number]  HPI Here for 4 days of sinus pressure, HA, and a dry cough. On Mucinex    Review of Systems  Constitutional: Negative.   HENT: Positive for congestion, postnasal drip and sinus pressure.   Eyes: Negative.   Respiratory: Positive for cough.        Objective:   Physical Exam  Constitutional: She appears well-developed and well-nourished.  HENT:  Right Ear: External ear normal.  Left Ear: External ear normal.  Nose: Nose normal.  Mouth/Throat: Oropharynx is clear and moist.  Eyes: Conjunctivae are normal.  Pulmonary/Chest: Effort normal and breath sounds normal.  Lymphadenopathy:    She has no cervical adenopathy.          Assessment & Plan:  Drink fluids.

## 2013-05-05 ENCOUNTER — Other Ambulatory Visit: Payer: Self-pay | Admitting: Certified Nurse Midwife

## 2013-05-05 ENCOUNTER — Telehealth: Payer: Self-pay | Admitting: Certified Nurse Midwife

## 2013-05-05 DIAGNOSIS — B379 Candidiasis, unspecified: Secondary | ICD-10-CM

## 2013-05-05 MED ORDER — FLUCONAZOLE 150 MG PO TABS: 150.0000 mg | ORAL_TABLET | Freq: Once | ORAL | Status: DC

## 2013-05-05 NOTE — Telephone Encounter (Signed)
Diflucan sent to her pharmacy due to history of chronic yeast, should help control if not will need OV

## 2013-05-05 NOTE — Telephone Encounter (Signed)
Spoke with patient. She has hx of yeast vaginitis. She has been on Zpak for 2 days due to sinus infection. She woke up this morning with white thick discharge. I advised to use monistat 3 or 7 day and if not relieved then will need OV. Patient request that I send message to Debbi "I am begging you to send something in for me".  Last OV 03/27/13.

## 2013-05-05 NOTE — Telephone Encounter (Signed)
Patient woke up this morning with yeast. Patient is currently taking an antibiotic and thinks this may be related. Patient is asking if she should try OTC meds before making an appointment.

## 2013-05-05 NOTE — Telephone Encounter (Signed)
Spoke with patient. Message from Regina Eck CNM given and will follow up prn.

## 2013-06-13 ENCOUNTER — Telehealth: Payer: Self-pay | Admitting: Certified Nurse Midwife

## 2013-06-13 NOTE — Telephone Encounter (Signed)
Patient reports very strong vaginal odor. Denies itching, burning, irritation, pain or fever. States has used douche and still will not clear up.  Used Madalyn Rob Eve vinegar and water1 week ago and repeated yesterday. Advised to discontinue douche and come for OV for eval in am. Dr Sabra Heck 0900 06-14-13.  Routing to provider for final review. Patient agreeable to disposition. Will close encounter

## 2013-06-13 NOTE — Telephone Encounter (Signed)
Awful vaginal Odor for about a few months. Seems to be progressing. Doesn't know what she needs to do.

## 2013-06-13 NOTE — Telephone Encounter (Signed)
Had AEX with Debbi 09-2012 and was seen in Oct 2014 by Debbi for similar issue.

## 2013-06-14 ENCOUNTER — Encounter: Payer: Self-pay | Admitting: Obstetrics & Gynecology

## 2013-06-14 ENCOUNTER — Ambulatory Visit (INDEPENDENT_AMBULATORY_CARE_PROVIDER_SITE_OTHER): Payer: BC Managed Care – PPO | Admitting: Obstetrics & Gynecology

## 2013-06-14 VITALS — BP 110/70 | HR 70 | Resp 18 | Ht 68.0 in | Wt 138.0 lb

## 2013-06-14 DIAGNOSIS — Z202 Contact with and (suspected) exposure to infections with a predominantly sexual mode of transmission: Secondary | ICD-10-CM

## 2013-06-14 DIAGNOSIS — N951 Menopausal and female climacteric states: Secondary | ICD-10-CM

## 2013-06-14 MED ORDER — METRONIDAZOLE 0.75 % VA GEL: 1.0000 | Freq: Every day | VAGINAL | Status: DC

## 2013-06-14 MED ORDER — FLUCONAZOLE 150 MG PO TABS: 150.0000 mg | ORAL_TABLET | Freq: Once | ORAL | Status: DC

## 2013-06-14 NOTE — Patient Instructions (Signed)
Douche:  Hydrogen peroxide and water (mix 1:1) and use twice weekly

## 2013-06-14 NOTE — Progress Notes (Signed)
Subjective:     Patient ID: Jill Mcintyre, female   DOB: 07/23/1960, 52 y.o.   MRN: [id number]  HPI 52 yo G2P2 DWF (now engaged) with 2 month hx of vaginal discharge with odor.  Boyfriend had an affair.  Had STD testing in 10/14 with D. Hollice Espy.  All negative.  Odor is fishy.  No itching.  Not sure what to do.  Also, complaints of vaginal dryness and decreased libido.  On OCPs that she takes two months in a row.    Review of Systems  All other systems reviewed and are negative.       Objective:   Physical Exam  Constitutional: She is oriented to person, place, and time. She appears well-developed and well-nourished.  Abdominal: Soft. Bowel sounds are normal. She exhibits no distension and no mass. There is no tenderness. There is no rebound and no guarding.  Genitourinary: Uterus normal. There is no rash or tenderness on the right labia. There is no rash or tenderness on the left labia. No bleeding around the vagina. No foreign body around the vagina. No signs of injury around the vagina. Vaginal discharge found.  Lymphadenopathy:       Right: No inguinal adenopathy present.       Left: No inguinal adenopathy present.  Neurological: She is alert and oriented to person, place, and time.  Skin: Skin is warm and dry.  Psychiatric: She has a normal mood and affect.   Wet smear:  Ph 5.0, Saline: neg yeast, +clue cell, KOH: +whiff, -yeast    Assessment:     BV Vaginal dryness     Plan:     Metrogel 0.75% qhs x 5 days Diflucan 130m po x 1, repeat 48 hours if gets yeast Can start hydrogen peroxide/water douching after treatment GC/Chl today  [facility]

## 2013-06-17 LAB — IPS N GONORRHOEA AND CHLAMYDIA BY PCR

## 2013-06-21 MED ORDER — ESTRADIOL 1 MG PO TABS: 1.0000 mg | ORAL_TABLET | Freq: Every day | ORAL | Status: DC

## 2013-06-21 MED ORDER — PROGESTERONE MICRONIZED 200 MG PO CAPS: 200.0000 mg | ORAL_CAPSULE | Freq: Every day | ORAL | Status: DC

## 2013-06-21 NOTE — Addendum Note (Signed)
Addended by: Megan Salon on: 06/21/2013 07:37 AM   Modules accepted: Orders

## 2013-06-22 HISTORY — PX: COLONOSCOPY: SHX174

## 2013-06-27 ENCOUNTER — Ambulatory Visit: Payer: BC Managed Care – PPO | Admitting: Certified Nurse Midwife

## 2013-06-27 ENCOUNTER — Telehealth: Payer: Self-pay | Admitting: Family Medicine

## 2013-06-27 NOTE — Telephone Encounter (Signed)
appt sched and lmom fpr pt to call and confirm

## 2013-06-27 NOTE — Telephone Encounter (Signed)
Patient Information:  Caller Name: Kadelyn  Phone: [phone]  Patient: Jill Mcintyre, Jill Mcintyre  Gender: Female  DOB: Jan 02, 1961  Age: 53 Years  PCP: Alysia Pen[facility])  Pregnant: No  Office Follow Up:  Does the office need to follow up with this patient?: Yes  Instructions For The Office: No appt's available with Dr Sarajane Jews, pt is requesting appt/pain med or Dr Barbie Banner advice.  Please follow up with pt.   Symptoms  Reason For Call & Symptoms: 06/25/13 awoke with lower left back and left neck pain, no injury, burning feeling up left side of back.  06/26/13  rested most of the day and took Voltaren 46m has not helped nor Ibuprofen.  06/27/13 pain continues, to turn back or neck is extremely painful.   Rates pain 7/10 after taking a "back pill" someone at work gave her, unsure of the name.  Before taking her pain was 8/10.  Pt is sitting in chair at work propped on pillows.   No problems with urination, no weakness/numbness/tingling into legs/arms.  Afebrile.  Reviewed Health History In EMR: Yes  Reviewed Medications In EMR: Yes  Reviewed Allergies In EMR: Yes  Reviewed Surgeries / Procedures: Yes  Date of Onset of Symptoms: 06/25/2013  Treatments Tried: Voltaren, Ibuprofen  Treatments Tried Worked: No OB / GYN:  LMP: Unknown  Guideline(s) Used:  Back Pain  Disposition Per Guideline:   See Today or Tomorrow in Office  Reason For Disposition Reached:   Patient wants to be seen  Advice Given:  Call Back If:  You become worse.  Patient Will Follow Care Advice:  YES

## 2013-06-27 NOTE — Telephone Encounter (Signed)
I spoke with pt and gave the below information. Can you call pt and schedule the visit for 06/28/13?

## 2013-06-27 NOTE — Telephone Encounter (Signed)
Tell her to apply moist heat to the neck and take 800 mg of Ibuprofen every 6 hours. Make an OV to see [place] tomorrow

## 2013-06-28 ENCOUNTER — Encounter: Payer: Self-pay | Admitting: Family Medicine

## 2013-06-28 ENCOUNTER — Ambulatory Visit (INDEPENDENT_AMBULATORY_CARE_PROVIDER_SITE_OTHER): Payer: BC Managed Care – PPO | Admitting: Family Medicine

## 2013-06-28 VITALS — BP 108/62 | HR 76 | Temp 98.6°F | Wt 141.0 lb

## 2013-06-28 DIAGNOSIS — M546 Pain in thoracic spine: Secondary | ICD-10-CM

## 2013-06-28 DIAGNOSIS — M542 Cervicalgia: Secondary | ICD-10-CM

## 2013-06-28 MED ORDER — PREDNISONE 10 MG PO TABS: ORAL_TABLET | ORAL | Status: DC

## 2013-06-28 MED ORDER — CYCLOBENZAPRINE HCL 10 MG PO TABS: 10.0000 mg | ORAL_TABLET | Freq: Three times a day (TID) | ORAL | Status: DC | PRN

## 2013-06-28 NOTE — Progress Notes (Signed)
Pre visit review using our clinic review tool, if applicable. No additional management support is needed unless otherwise documented below in the visit note. 

## 2013-06-28 NOTE — Progress Notes (Signed)
   Subjective:    Patient ID: Jill Mcintyre, female    DOB: July 15, 1960, 53 y.o.   MRN: [id number]  HPI Here for stiffness and severe pain in th lower neck and upper back. She has had mild problems in this area for a year or more, but never to this degree. About 4 days ago while having sex she felt a sudden sharp pain and heard a "crack" in her neck. She has had this pain ever since. The pain radiates down the tops of both shoulders. Using heat and Ibuprofen with no relief.    Review of Systems  Constitutional: Negative.   Musculoskeletal: Positive for back pain, neck pain and neck stiffness.       Objective:   Physical Exam  Constitutional:  In pain, holding her head and upper body stiffly   Musculoskeletal:  Tender in the posterior neck with reduced ROM and spasms. Also tender in the upper thoracic spine          Assessment & Plan:  Neck strain with probable underlying disc disease. Use heat and add Flexeril and a prednisone taper. Set up an MRI scan. Out of work today until 07-03-13.

## 2013-07-03 ENCOUNTER — Telehealth: Payer: Self-pay | Admitting: Family Medicine

## 2013-07-03 NOTE — Telephone Encounter (Signed)
Pt following up on mri  appt and hopes it will be done today. pls advise

## 2013-07-07 NOTE — Telephone Encounter (Signed)
Pt states she is feeling better and would like to cancel her MRI.  Pt is going to call [facility] herself and cancel actual appt. But wanted to let you know she is much better and has added mobility.

## 2013-07-07 NOTE — Telephone Encounter (Signed)
noted 

## 2013-07-08 ENCOUNTER — Inpatient Hospital Stay: Admission: RE | Admit: 2013-07-08 | Payer: BC Managed Care – PPO | Source: Ambulatory Visit

## 2013-07-08 ENCOUNTER — Other Ambulatory Visit: Payer: BC Managed Care – PPO

## 2013-07-25 ENCOUNTER — Other Ambulatory Visit (INDEPENDENT_AMBULATORY_CARE_PROVIDER_SITE_OTHER): Payer: BC Managed Care – PPO

## 2013-07-25 DIAGNOSIS — Z0000 Encounter for general adult medical examination without abnormal findings: Secondary | ICD-10-CM

## 2013-07-25 LAB — LIPID PANEL
CHOL/HDL RATIO: 3
Cholesterol: 199 mg/dL (ref 0–200)
HDL: 69.9 mg/dL (ref >39.00)
LDL Cholesterol: 112 mg/dL — ABNORMAL HIGH (ref 0–99)
Triglycerides: 88 mg/dL (ref 0.0–149.0)
VLDL: 17.6 mg/dL (ref 0.0–40.0)

## 2013-07-25 LAB — CBC WITH DIFFERENTIAL/PLATELET
BASOS PCT: 0.6 % (ref 0.0–3.0)
Basophils Absolute: 0 10*3/uL (ref 0.0–0.1)
Eosinophils Absolute: 0.1 10*3/uL (ref 0.0–0.7)
Eosinophils Relative: 2.6 % (ref 0.0–5.0)
HCT: 33.8 % — ABNORMAL LOW (ref 36.0–46.0)
Hemoglobin: 10.3 g/dL — ABNORMAL LOW (ref 12.0–15.0)
Lymphocytes Relative: 31 % (ref 12.0–46.0)
Lymphs Abs: 1.7 10*3/uL (ref 0.7–4.0)
MCHC: 30.6 g/dL (ref 30.0–36.0)
MCV: 72.2 fl — AB (ref 78.0–100.0)
MONO ABS: 0.4 10*3/uL (ref 0.1–1.0)
Monocytes Relative: 7 % (ref 3.0–12.0)
Neutro Abs: 3.1 10*3/uL (ref 1.4–7.7)
Neutrophils Relative %: 58.8 % (ref 43.0–77.0)
Platelets: 293 10*3/uL (ref 150.0–400.0)
RBC: 4.67 Mil/uL (ref 3.87–5.11)
RDW: 18.7 % — ABNORMAL HIGH (ref 11.5–14.6)
WBC: 5.3 10*3/uL (ref 4.5–10.5)

## 2013-07-25 LAB — POCT URINALYSIS DIPSTICK
Bilirubin, UA: NEGATIVE
GLUCOSE UA: NEGATIVE
Ketones, UA: NEGATIVE
Nitrite, UA: NEGATIVE
Protein, UA: NEGATIVE
Spec Grav, UA: 1.02
Urobilinogen, UA: 0.2
pH, UA: 7

## 2013-07-25 LAB — BASIC METABOLIC PANEL
BUN: 13 mg/dL (ref 6–23)
CHLORIDE: 106 meq/L (ref 96–112)
CO2: 26 mEq/L (ref 19–32)
Calcium: 9.2 mg/dL (ref 8.4–10.5)
Creatinine, Ser: 0.8 mg/dL (ref 0.4–1.2)
GFR: 85.38 mL/min (ref >60.00)
Glucose, Bld: 95 mg/dL (ref 70–99)
Potassium: 4.1 mEq/L (ref 3.5–5.1)
SODIUM: 139 meq/L (ref 135–145)

## 2013-07-25 LAB — HEPATIC FUNCTION PANEL
ALT: 31 U/L (ref 0–35)
AST: 27 U/L (ref 0–37)
Albumin: 3.7 g/dL (ref 3.5–5.2)
Alkaline Phosphatase: 44 U/L (ref 39–117)
BILIRUBIN DIRECT: 0 mg/dL (ref 0.0–0.3)
BILIRUBIN TOTAL: 0.5 mg/dL (ref 0.3–1.2)
Total Protein: 6.6 g/dL (ref 6.0–8.3)

## 2013-07-25 LAB — TSH: TSH: 1.03 u[IU]/mL (ref 0.35–5.50)

## 2013-08-02 ENCOUNTER — Ambulatory Visit (INDEPENDENT_AMBULATORY_CARE_PROVIDER_SITE_OTHER): Payer: BC Managed Care – PPO | Admitting: Family Medicine

## 2013-08-02 ENCOUNTER — Encounter: Payer: Self-pay | Admitting: Family Medicine

## 2013-08-02 VITALS — BP 110/60 | HR 87 | Temp 98.3°F | Ht 68.75 in | Wt 140.0 lb

## 2013-08-02 DIAGNOSIS — Z0000 Encounter for general adult medical examination without abnormal findings: Secondary | ICD-10-CM

## 2013-08-02 NOTE — Progress Notes (Signed)
   Subjective:    Patient ID: Jill Mcintyre, female    DOB: July 17, 1960, 53 y.o.   MRN: [id number]  HPI 53 yr old female for a cpx. She feels well.    Review of Systems  Constitutional: Negative.   HENT: Negative.   Eyes: Negative.   Respiratory: Negative.   Cardiovascular: Negative.   Gastrointestinal: Negative.   Genitourinary: Negative for dysuria, urgency, frequency, hematuria, flank pain, decreased urine volume, enuresis, difficulty urinating, pelvic pain and dyspareunia.  Musculoskeletal: Negative.   Skin: Negative.   Neurological: Negative.   Psychiatric/Behavioral: Negative.        Objective:   Physical Exam  Constitutional: She is oriented to person, place, and time. She appears well-developed and well-nourished. No distress.  HENT:  Head: Normocephalic and atraumatic.  Right Ear: External ear normal.  Left Ear: External ear normal.  Nose: Nose normal.  Mouth/Throat: Oropharynx is clear and moist. No oropharyngeal exudate.  Eyes: Conjunctivae and EOM are normal. Pupils are equal, round, and reactive to light. No scleral icterus.  Neck: Normal range of motion. Neck supple. No JVD present. No thyromegaly present.  Cardiovascular: Normal rate, regular rhythm, normal heart sounds and intact distal pulses.  Exam reveals no gallop and no friction rub.   No murmur heard. EKG normal   Pulmonary/Chest: Effort normal and breath sounds normal. No respiratory distress. She has no wheezes. She has no rales. She exhibits no tenderness.  Abdominal: Soft. Bowel sounds are normal. She exhibits no distension and no mass. There is no tenderness. There is no rebound and no guarding.  Musculoskeletal: Normal range of motion. She exhibits no edema and no tenderness.  Lymphadenopathy:    She has no cervical adenopathy.  Neurological: She is alert and oriented to person, place, and time. She has normal reflexes. No cranial nerve deficit. She exhibits normal muscle tone. Coordination  normal.  Skin: Skin is warm and dry. No rash noted. No erythema.  Psychiatric: She has a normal mood and affect. Her behavior is normal. Judgment and thought content normal.          Assessment & Plan:  Well exam.

## 2013-08-30 ENCOUNTER — Telehealth: Payer: Self-pay | Admitting: Family Medicine

## 2013-08-30 NOTE — Telephone Encounter (Signed)
[id number] - [place], [place] - [address] requesting new script for Voltaren Oral Tablet Delayed Release 82m

## 2013-08-31 ENCOUNTER — Ambulatory Visit (INDEPENDENT_AMBULATORY_CARE_PROVIDER_SITE_OTHER): Payer: BC Managed Care – PPO | Admitting: Obstetrics & Gynecology

## 2013-08-31 ENCOUNTER — Encounter: Payer: Self-pay | Admitting: Obstetrics & Gynecology

## 2013-08-31 VITALS — BP 101/70 | HR 89 | Wt 141.0 lb

## 2013-08-31 DIAGNOSIS — N951 Menopausal and female climacteric states: Secondary | ICD-10-CM

## 2013-08-31 MED ORDER — TEMAZEPAM 15 MG PO CAPS: 15.0000 mg | ORAL_CAPSULE | Freq: Every evening | ORAL | Status: DC | PRN

## 2013-08-31 MED ORDER — DICLOFENAC SODIUM 75 MG PO TBEC: 75.0000 mg | DELAYED_RELEASE_TABLET | Freq: Two times a day (BID) | ORAL | Status: DC

## 2013-08-31 NOTE — Patient Instructions (Signed)
Black cohosh Estroven Replens Olive oil

## 2013-08-31 NOTE — Telephone Encounter (Signed)
I sent script e-scribe. 

## 2013-08-31 NOTE — Progress Notes (Signed)
53 y.o. Married Caucasian female G2P2002 here for discussion of hot flashes and night sweats and vaginal dryness.  Using lubricant for vaginal dryness.  Concerned about the risks of estrogen.  She would prefer to discuss non-hormonal options.  Black cohosh and estroven.  She really wants to "natural products" first.  Olive oil and Replens discussed for vaginal dryness.  Not interested in estrogen for this right now but may want to consider later.  Discussed sleep issues with patient.  She has been using some Xanax she had.  Restoril discussed.  She would like a prescription for this to have on hand but she doesn't want to use it regularly.  She and I discussed the risks of dependence with sleep medications.    O: Healthy WD,WN female Affect: normal  A: Menopausal symptoms   P: Restoril 24m prn as needed OTC products discussed.  Pt will call and give me updates or if she has any issues/concerns  ~15 minutes spent with patient >50% of time was in face to face discussion of above.

## 2013-10-04 ENCOUNTER — Ambulatory Visit: Payer: BC Managed Care – PPO | Admitting: Family Medicine

## 2013-10-06 ENCOUNTER — Encounter: Payer: Self-pay | Admitting: Family Medicine

## 2013-10-06 ENCOUNTER — Ambulatory Visit (INDEPENDENT_AMBULATORY_CARE_PROVIDER_SITE_OTHER): Payer: BC Managed Care – PPO | Admitting: Family Medicine

## 2013-10-06 VITALS — BP 112/70 | Temp 98.3°F | Ht 68.75 in | Wt 140.0 lb

## 2013-10-06 DIAGNOSIS — M2550 Pain in unspecified joint: Secondary | ICD-10-CM

## 2013-10-06 LAB — CBC WITH DIFFERENTIAL/PLATELET
BASOS PCT: 0.6 % (ref 0.0–3.0)
Basophils Absolute: 0 10*3/uL (ref 0.0–0.1)
EOS ABS: 0 10*3/uL (ref 0.0–0.7)
EOS PCT: 1 % (ref 0.0–5.0)
HCT: 28.2 % — ABNORMAL LOW (ref 36.0–46.0)
HEMOGLOBIN: 8.9 g/dL — AB (ref 12.0–15.0)
LYMPHS PCT: 29.2 % (ref 12.0–46.0)
Lymphs Abs: 1.5 10*3/uL (ref 0.7–4.0)
MCHC: 31.7 g/dL (ref 30.0–36.0)
MCV: 70.3 fl — ABNORMAL LOW (ref 78.0–100.0)
Monocytes Absolute: 0.3 10*3/uL (ref 0.1–1.0)
Monocytes Relative: 6.3 % (ref 3.0–12.0)
NEUTROS ABS: 3.2 10*3/uL (ref 1.4–7.7)
Neutrophils Relative %: 62.9 % (ref 43.0–77.0)
Platelets: 271 10*3/uL (ref 150.0–400.0)
RBC: 4.01 Mil/uL (ref 3.87–5.11)
RDW: 16.5 % — ABNORMAL HIGH (ref 11.5–14.6)
WBC: 5.1 10*3/uL (ref 4.5–10.5)

## 2013-10-06 LAB — C-REACTIVE PROTEIN: CRP: <0.5 mg/dL (ref 0.5–20.0)

## 2013-10-06 LAB — SEDIMENTATION RATE: SED RATE: 9 mm/h (ref 0–22)

## 2013-10-06 LAB — RHEUMATOID FACTOR: Rhuematoid fact SerPl-aCnc: <10 IU/mL (ref <=14)

## 2013-10-06 MED ORDER — METHYLPREDNISOLONE ACETATE 80 MG/ML IJ SUSP
120.0000 mg | Freq: Once | INTRAMUSCULAR | Status: AC
  Administered 2013-10-06: 120 mg via INTRAMUSCULAR

## 2013-10-06 NOTE — Progress Notes (Signed)
   Subjective:    Patient ID: Jill Mcintyre, female    DOB: 06-10-1961, 53 y.o.   MRN: [id number]  HPI Here for stiffness and pain in the hands. They have bothered her for the past 3 weeks but they have been much worse the past few days. She wakes up in the mornings with her hands clenched shut and it takes awhile to work them open. Using Diclofenac and Advil. Most of her pain is in the PIPs, MCPs, and the wrist.    Review of Systems  Constitutional: Negative.   Musculoskeletal: Positive for arthralgias and joint swelling.       Objective:   Physical Exam  Constitutional: She appears well-developed and well-nourished.  Musculoskeletal:  No visible swelling in the hands but she is tender over the MCPs and wrist, full ROM          Assessment & Plan:  She seems to have an inflammatory arthritis of some sort. We will give  her a steroid shot today and get some labs. Wrote her out of work today through 10-10-13.

## 2013-10-06 NOTE — Progress Notes (Signed)
Pre visit review using our clinic review tool, if applicable. No additional management support is needed unless otherwise documented below in the visit note. 

## 2013-10-06 NOTE — Addendum Note (Signed)
Addended by: Aggie Hacker A on: 10/06/2013 02:34 PM   Modules accepted: Orders

## 2013-10-10 ENCOUNTER — Telehealth: Payer: Self-pay | Admitting: Family Medicine

## 2013-10-10 ENCOUNTER — Ambulatory Visit: Payer: 59 | Admitting: Certified Nurse Midwife

## 2013-10-10 NOTE — Telephone Encounter (Signed)
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

## 2013-10-10 NOTE — Telephone Encounter (Signed)
FYI receive a call from [place] she called to verify that a work note written by Dr Sarajane Jews was valid.

## 2013-10-10 NOTE — Telephone Encounter (Signed)
I left voice message for pt. The note was written through 10/10/13. She can call back if she needs anything further. I do not have a phone number for [place].

## 2013-10-10 NOTE — Telephone Encounter (Signed)
Pt gave the phone number for Human Resources [phone]. I called and did verify that pt has a valid note to return to work on 10/11/13.

## 2013-10-16 ENCOUNTER — Ambulatory Visit (INDEPENDENT_AMBULATORY_CARE_PROVIDER_SITE_OTHER): Payer: BC Managed Care – PPO | Admitting: Obstetrics & Gynecology

## 2013-10-16 ENCOUNTER — Encounter: Payer: Self-pay | Admitting: Obstetrics & Gynecology

## 2013-10-16 VITALS — BP 100/60 | HR 64 | Resp 20 | Ht 68.25 in | Wt 140.2 lb

## 2013-10-16 DIAGNOSIS — Z23 Encounter for immunization: Secondary | ICD-10-CM

## 2013-10-16 DIAGNOSIS — Z124 Encounter for screening for malignant neoplasm of cervix: Secondary | ICD-10-CM

## 2013-10-16 DIAGNOSIS — Z01419 Encounter for gynecological examination (general) (routine) without abnormal findings: Secondary | ICD-10-CM

## 2013-10-16 NOTE — Progress Notes (Signed)
53 y.o. [id number] DivorcedCaucasianF here for annual exam.  Lost her job last week.  Already has several job interviews.  Not having much issue with hot flashes or night sweats.  Using lubrication regularly.    Having some issues with constipation due to iron.  Hb was 8.9.  Was having hand cramps.  Saw [name].  Does not have follow up set up yet.  Patient's last menstrual period was 07/13/2013.          Sexually active: yes  The current method of family planning is vasectomy.    Exercising: yes  gym 2-3 x weekly Smoker:  Former smoker years ago  Health Maintenance: Pap:  10/06/12-WNL History of abnormal Pap:  Yes ASCUS/positive HR HPV 10/02/11, colposcopy 10/07/11-CIN I MMG:  10/27/10-normal Colonoscopy:  Will schedule BMD:   2007 TDaP:  ? Screening Labs: PCP, Hb today: PCP, Urine today: PCP   reports that she has quit smoking. She started smoking about 31 years ago. She has never used smokeless tobacco. She reports that she drinks about 1.5 - 2 ounces of alcohol per week. She reports that she does not use illicit drugs.  Past Medical History  Diagnosis Date  . ALLERGIC RHINITIS   . Anxiety   . Bipolar depression     sees Dr Gala Murdoch at [facility] and sees Trey Paula for  therapy  . GERD (gastroesophageal reflux disease)   . Herpes simplex   . Constipation   . Anemia   . Acne     sees Dr. Jarome Matin for skin checks and acne  . HPV (human papilloma virus) infection   . Routine gynecological examination     sees Dr. Caren Griffins [place]   . IBS (irritable bowel syndrome)   . Bipolar 1 disorder   . Insomnia   . Migraines   . Abnormal Pap smear of cervix     Past Surgical History  Procedure Laterality Date  . Esophagogastroduodenoscopy  09/07/2006    Dr Ardis Hughs  . Breast enhancement surgery    . Basal cell carcinoma excision      Dr Amy Martinique  . Colposcopy  10/07/11    CIN I  . Cesarean section  1996    Current Outpatient Prescriptions  Medication Sig Dispense  Refill  . B Complex Vitamins (B COMPLEX PO) Take by mouth daily.       . Biotin (BIOTIN 5000) 5 MG CAPS Take 1,000 mg by mouth daily.       . Cholecalciferol (VITAMIN D PO) Take 1,000 Units by mouth daily.       . cyclobenzaprine (FLEXERIL) 10 MG tablet Take 1 tablet (10 mg total) by mouth 3 (three) times daily as needed for muscle spasms.  60 tablet  2  . diclofenac (VOLTAREN) 75 MG EC tablet Take 1 tablet (75 mg total) by mouth 2 (two) times daily.  60 tablet  3  . lamoTRIgine (LAMICTAL) 100 MG tablet Take 100 mg by mouth daily.       . Multiple Vitamin (MULTI-VITAMIN PO) Take by mouth daily.      . Nutritional Supplements (ESTROVEN PO) Take by mouth daily.      Marland Kitchen omeprazole (PRILOSEC) 40 MG capsule TAKE ONE CAPSULE BY MOUTH ONE TIME DAILY  30 capsule  11  . Probiotic Product (PROBIOTIC DAILY PO) Take by mouth daily.      . temazepam (RESTORIL) 15 MG capsule Take 1 capsule (15 mg total) by mouth at bedtime as needed  for sleep.  30 capsule  1  . valACYclovir (VALTREX) 1000 MG tablet Take 0.5 tablets (500 mg total) by mouth daily.  90 tablet  0  . fluconazole (DIFLUCAN) 150 MG tablet Take 1 tablet (150 mg total) by mouth once. Take tablet 12/26 and repeat 12/29  2 tablet  0  . furosemide (LASIX) 20 MG tablet Take 1 tablet (20 mg total) by mouth daily as needed.  30 tablet  5  . lidocaine (XYLOCAINE JELLY) 2 % jelly Apply topically 3 (three) times daily.  30 mL  11  . metroNIDAZOLE (METROGEL) 0.75 % vaginal gel Place 1 Applicatorful vaginally at bedtime.  70 g  0  . nystatin-triamcinolone ointment (MYCOLOG) Apply topically 2 (two) times daily.  30 g  1   No current facility-administered medications for this visit.    Family History  Problem Relation Age of Onset  . Alcohol abuse    . Arthritis    . Cancer      breast, ovrarian, uterine  . Cancer Mother     ROS:  Pertinent items are noted in HPI.  Otherwise, a comprehensive ROS was negative.  Exam:   BP 100/60  Pulse 64  Resp 20   Ht 5' 8.25" (1.734 m)  Wt 140 lb 3.2 oz (63.594 kg)  BMI 21.15 kg/m2  LMP 07/13/2013    Height: 5' 8.25" (173.4 cm)  Ht Readings from Last 3 Encounters:  10/16/13 5' 8.25" (1.734 m)  10/06/13 5' 8.75" (1.746 m)  08/02/13 5' 8.75" (1.746 m)    General appearance: alert, cooperative and appears stated age Head: Normocephalic, without obvious abnormality, atraumatic Neck: no adenopathy, supple, symmetrical, trachea midline and thyroid normal to inspection and palpation Lungs: clear to auscultation bilaterally Breasts: normal appearance, no masses or tenderness Heart: regular rate and rhythm Abdomen: soft, non-tender; bowel sounds normal; no masses,  no organomegaly Extremities: extremities normal, atraumatic, no cyanosis or edema Skin: Skin color, texture, turgor normal. No rashes or lesions Lymph nodes: Cervical, supraclavicular, and axillary nodes normal. No abnormal inguinal nodes palpated Neurologic: Grossly normal   Pelvic: External genitalia:  no lesions              Urethra:  normal appearing urethra with no masses, tenderness or lesions              Bartholins and Skenes: normal                 Vagina: normal appearing vagina with normal color and discharge, no lesions              Cervix: no lesions              Pap taken: yes Bimanual Exam:  Uterus:  normal size, contour, position, consistency, mobility, non-tender              Adnexa: normal adnexa and no mass, fullness, tenderness               Rectovaginal: Confirms               Anus:  normal sphincter tone, no lesions  A:  Well Woman with normal exam PMP, no HRT Bipolar d/o.  Stable. H/O CIN 1.  H/O colposcopy with biopsies 4/13. Anemia.  On iron.    P:   Mammogram due.  Pt aware. Colonoscopy due.  She is going to schedule later this year.  KNOWS I really recommend this.  She states she will as soon as her  insurance issues are sorted out.  Will refer to Dr. Collene Mares.  pap smear with HR HPV today Tdap today return  annually or prn  An After Visit Summary was printed and given to the patient.

## 2013-10-16 NOTE — Patient Instructions (Signed)

## 2013-10-17 ENCOUNTER — Other Ambulatory Visit: Payer: Self-pay | Admitting: Family Medicine

## 2013-10-17 ENCOUNTER — Telehealth: Payer: Self-pay | Admitting: Obstetrics & Gynecology

## 2013-10-17 NOTE — Telephone Encounter (Signed)
Patient was told to call if she started her cycle. Patient has cramps and bleeding that started this morning. Patient says no need to call unless you have questions.

## 2013-10-17 NOTE — Telephone Encounter (Signed)
Spoke with patient. Patient states that LMP was 1/22. Woke up this morning with menstrual cramping and had begun cycle. States that bleeding is "Just like normal when I was have regular periods. I thought I was dreaming but it is a real period not spotting." Patient denies pain other than cramping. States she is wearing a tampon and panty liner that she is changing every 5-6 hours. Patient states "It is just bizarre that I started this morning since I was just in the office yesterday. I am not concerned I just want to let Dr.Miller know." Advised would send message to Dr.Miller to let her know and give patient a call back if Dr.Miller has any further instructions for patient. Patient agreeable.

## 2013-10-18 NOTE — Telephone Encounter (Signed)
This sometimes happens.  Nothing to worry about.  I need to know if she has another cycle.  Her hot flashes had gotten a lot better so I'm not surprised.  We actually talked about that while she was here for her exam.

## 2013-10-18 NOTE — Telephone Encounter (Signed)
Spoke with patient. Message from [place] given as seen below. Patient agreeable and verbalizes understanding. Will call back to let us know if she has another cycle.  Routing to provider for final review. Patient agreeable to disposition. Will close encounter

## 2013-10-19 LAB — IPS PAP TEST WITH HPV

## 2013-10-23 ENCOUNTER — Telehealth: Payer: Self-pay

## 2013-10-23 NOTE — Telephone Encounter (Signed)
Message copied by Robley Fries on Mon Oct 23, 2013  9:43 AM ------      Message from: Megan Salon      Created: Thu Oct 19, 2013  6:41 PM       Inform pap neg and HR HPV neg.  H/O cin 1 on biopsy 4/13.  02 recall.  If in any recall, ok to remove. ------

## 2013-10-23 NOTE — Telephone Encounter (Signed)
Lmtcb//kn 

## 2013-11-10 ENCOUNTER — Other Ambulatory Visit: Payer: Self-pay | Admitting: Obstetrics & Gynecology

## 2013-11-10 NOTE — Telephone Encounter (Signed)
Last AEX /27/15 Last refill 08/31/13 #30/ 1 refills  Please approve or deny Rx.

## 2013-11-15 NOTE — Telephone Encounter (Signed)
Patient notified of results//kn

## 2014-01-18 ENCOUNTER — Telehealth: Payer: Self-pay

## 2014-01-18 DIAGNOSIS — Z1211 Encounter for screening for malignant neoplasm of colon: Secondary | ICD-10-CM

## 2014-01-18 DIAGNOSIS — D508 Other iron deficiency anemias: Secondary | ICD-10-CM

## 2014-01-18 NOTE — Telephone Encounter (Signed)
Message copied by Robley Fries on Thu Jan 18, 2014 10:00 AM ------      Message from: Megan Salon      Created: Thu Jan 18, 2014  7:08 AM      Regarding: follow up hemoglobin       Claiborne Billings,      Saw pt in April.  Hb was low.  She had recently seen her PCP.  I wanted to check and see if she'd had the CBC rechecked.  Also, I wanted her to do a colonoscopy but she had just lost her job.  I want to know if she's ready for [place] to refer her now?  Can you call?  Thanks.            MSM ------

## 2014-01-18 NOTE — Telephone Encounter (Signed)
Spoke with patient-states has not had any labs since she was here in 4/15. She is ready to schedule the colonoscopy and said Dr Sabra Heck mentioned a female provider for this. She would like [place] to refer her with Dr Ammie Ferrier recommendation. Please advise.//kn

## 2014-03-06 NOTE — Telephone Encounter (Signed)
Dr Sabra Heck, do you want this patient to see Dr Collene Mares and do we need to put in the referral? Please advise.//kn

## 2014-03-07 NOTE — Telephone Encounter (Signed)
Spoke with patient-aware referral to Dr Collene Mares is being done. Appointment for labs scheduled for 03/09/14//kn

## 2014-03-07 NOTE — Telephone Encounter (Signed)
Yes, I'd like her to have a colonoscopy and CBC.  Order placed for labs here.  CBC and iron panel.  Referral order placed as well.  Sabrina--can you let me know when colonoscopy referral is scheduled?  Thanks.

## 2014-03-07 NOTE — Telephone Encounter (Signed)
Lab appointment made. Patient aware that referral is being done.//kn

## 2014-03-09 ENCOUNTER — Other Ambulatory Visit (INDEPENDENT_AMBULATORY_CARE_PROVIDER_SITE_OTHER): Payer: BC Managed Care – PPO

## 2014-03-09 DIAGNOSIS — Z1211 Encounter for screening for malignant neoplasm of colon: Secondary | ICD-10-CM

## 2014-03-09 DIAGNOSIS — D508 Other iron deficiency anemias: Secondary | ICD-10-CM

## 2014-03-09 LAB — CBC
HCT: 36.3 % (ref 36.0–46.0)
Hemoglobin: 11.5 g/dL — ABNORMAL LOW (ref 12.0–15.0)
MCH: 24.8 pg — AB (ref 26.0–34.0)
MCHC: 31.7 g/dL (ref 30.0–36.0)
MCV: 78.4 fL (ref 78.0–100.0)
PLATELETS: 236 10*3/uL (ref 150–400)
RBC: 4.63 MIL/uL (ref 3.87–5.11)
RDW: 16.3 % — ABNORMAL HIGH (ref 11.5–15.5)
WBC: 5.8 10*3/uL (ref 4.0–10.5)

## 2014-03-09 LAB — IRON: Iron: 69 ug/dL (ref 42–145)

## 2014-03-10 LAB — FERRITIN: FERRITIN: 3 ng/mL — AB (ref 10–291)

## 2014-03-13 ENCOUNTER — Telehealth: Payer: Self-pay | Admitting: Family Medicine

## 2014-03-13 MED ORDER — NITROFURANTOIN MONOHYD MACRO 100 MG PO CAPS: 100.0000 mg | ORAL_CAPSULE | Freq: Two times a day (BID) | ORAL | Status: DC

## 2014-03-13 NOTE — Telephone Encounter (Signed)
I sent script e-scribe and spoke with pt. 

## 2014-03-13 NOTE — Telephone Encounter (Signed)
Call in [place] 100 mg bid for 7 days

## 2014-03-13 NOTE — Telephone Encounter (Signed)
Pt said she has a bladder infection and is asking if Dr Sarajane Jews would call her in something. She is going out of town this evening.

## 2014-03-28 ENCOUNTER — Telehealth: Payer: Self-pay

## 2014-03-28 NOTE — Telephone Encounter (Signed)
Spoke with patient at time of incoming call. Patient states that she is concerned that she got a call from Dr.Mann's office for an appointment. "I did not know that I needed an appointment." Advised patient per note from 7/30 with Maryann Conners, [place] patient was ready to schedule appointment for colonoscopy. Advised Dr.Miller placed a referral to Dr.Mann and an appointment was made for a consult. Patient is agreeable and verbalizes understanding.  Routing to provider for final review. Patient agreeable to disposition. Will close encounter ;

## 2014-04-06 ENCOUNTER — Telehealth: Payer: Self-pay | Admitting: Obstetrics and Gynecology

## 2014-04-06 MED ORDER — NITROFURANTOIN MONOHYD MACRO 100 MG PO CAPS: 100.0000 mg | ORAL_CAPSULE | Freq: Two times a day (BID) | ORAL | Status: DC

## 2014-04-06 NOTE — Telephone Encounter (Signed)
Phone call returned to patient after hours, 6 pm Patient had intercourse last night and this am and is having sudden onset of pain with urination.  Working all weekend.  No fevers.  No blood in urine.  No back pain, nausea, or vomiting.   Was just treated with Macrobid for a urinary tract infection on 03/13/14 by PCP. Had resolution of her symptoms.   I told her that I would make an exception of our office policy which is for patient's to be seen for prescription medication.  Rx for Macrobid 100 mg po bid for 7 days.  Take OTC AZO standard 100 mg po tid prn. Hydrate.  Return to office for a test of cure in 10 days.  To urgent care or ER if develops nausea, vomiting, fever, worsening pain, blood in urine.

## 2014-04-21 ENCOUNTER — Telehealth: Payer: Self-pay | Admitting: Internal Medicine

## 2014-04-21 NOTE — Telephone Encounter (Signed)
Had EGD colonoscopy (Dr. Collene Mares) yesterday w/o problems.  Mild chest discomfort, dyspnea, palpitations. Has hx panic attacks in past  Advised ok to use xanax prn

## 2014-04-23 ENCOUNTER — Encounter: Payer: Self-pay | Admitting: Obstetrics & Gynecology

## 2014-04-24 ENCOUNTER — Ambulatory Visit (INDEPENDENT_AMBULATORY_CARE_PROVIDER_SITE_OTHER): Payer: BC Managed Care – PPO | Admitting: Family Medicine

## 2014-04-24 ENCOUNTER — Encounter: Payer: Self-pay | Admitting: Family Medicine

## 2014-04-24 ENCOUNTER — Ambulatory Visit: Payer: Self-pay | Admitting: Family Medicine

## 2014-04-24 VITALS — BP 117/73 | HR 70 | Temp 98.8°F | Ht 68.25 in | Wt 139.0 lb

## 2014-04-24 DIAGNOSIS — N390 Urinary tract infection, site not specified: Secondary | ICD-10-CM

## 2014-04-24 LAB — POCT URINALYSIS DIPSTICK
BILIRUBIN UA: NEGATIVE
Glucose, UA: NEGATIVE
Ketones, UA: NEGATIVE
SPEC GRAV UA: 1.02
UROBILINOGEN UA: 0.2
pH, UA: 6

## 2014-04-24 MED ORDER — CEPHALEXIN 500 MG PO CAPS: 500.0000 mg | ORAL_CAPSULE | Freq: Three times a day (TID) | ORAL | Status: AC

## 2014-04-24 MED ORDER — VALACYCLOVIR HCL 500 MG PO TABS: 500.0000 mg | ORAL_TABLET | Freq: Every day | ORAL | Status: DC

## 2014-04-24 MED ORDER — FLUCONAZOLE 150 MG PO TABS: 150.0000 mg | ORAL_TABLET | Freq: Once | ORAL | Status: DC

## 2014-04-24 NOTE — Progress Notes (Signed)
Pre visit review using our clinic review tool, if applicable. No additional management support is needed unless otherwise documented below in the visit note. 

## 2014-04-24 NOTE — Progress Notes (Signed)
   Subjective:    Patient ID: Jill Mcintyre, female    DOB: 1961-04-20, 53 y.o.   MRN: [id number]  HPI Here for days of urinary burning and urgency. No fever or nausea. Using Azo and drinking water. She has been treated for a possible UTI twice already this summer, once in August and again in September. Both of these times she was given Macrobid, but these were called in over the phone and no culture was obtained. Each time she felt better for a few weeks but the sx returned. She is allergic to sulfa meds and Cipro.    Review of Systems  Constitutional: Negative.   Genitourinary: Positive for dysuria, urgency and frequency. Negative for hematuria, flank pain and pelvic pain.       Objective:   Physical Exam  Constitutional: She appears well-developed and well-nourished.  Abdominal: Soft. Bowel sounds are normal. She exhibits no distension and no mass. There is no tenderness. There is no rebound and no guarding.          Assessment & Plan:  This is probably a partially treated UTI. Get a culture of today's sample. Treat with Keflex.

## 2014-04-27 LAB — URINE CULTURE

## 2014-04-30 ENCOUNTER — Telehealth: Payer: Self-pay | Admitting: Obstetrics & Gynecology

## 2014-04-30 NOTE — Telephone Encounter (Signed)
Spoke with patient. Patient states that she went to see PCP last week for a UTI. Patient states she has had three in the last 2-3 months. Patient was placed on Keflex 500 mg TID for 10 days. Patient states that over the weekend she developed "horrific itching and I am inflamed." Patient's LMP was on 10/31 which lasted for 7 days. States that she woke up this morning with "spotting that is the color of if I was taking AZO but I am not."  Denies vaginal odor. Denies pain. "I just really want tot come in to see my lady doctor for it." Advised patient will speak with provider and return call with appointment dates and times. Patient is agreeable.

## 2014-04-30 NOTE — Telephone Encounter (Signed)
Spoke with patient. Appointment scheduled for Thursday 11/11 at 1:30pm with Dr.Miller (time per Gay Filler). Patient is agreeable to date and time.  Routing to provider for final review. Patient agreeable to disposition. Will close encounter

## 2014-04-30 NOTE — Telephone Encounter (Signed)
Patient calling with "recurrent UTI's." She'd like to see Dr. Sabra Heck, if possible, since this problem is recurrent. Please advise?

## 2014-05-02 ENCOUNTER — Telehealth: Payer: Self-pay | Admitting: Obstetrics & Gynecology

## 2014-05-02 NOTE — Telephone Encounter (Signed)
Pt had to cancel her appointment for vaginal irritation due to her work schedule. She wants to come in on Friday but the available times will not work for her. Can you work her in on another day?

## 2014-05-02 NOTE — Telephone Encounter (Signed)
Spoke with patient. Patient states that she feels a little better. Patient is unable to get off work tomorrow for appointment and would like to come in to be seen on Friday. Agreeable to see NP. Appointment scheduled for 11/13 at 4pm with Jill Mcintyre CNM. Patient is agreeable to date and time.  Routing to provider for final review. Patient agreeable to disposition. Will close encounter

## 2014-05-03 ENCOUNTER — Ambulatory Visit: Payer: BC Managed Care – PPO | Admitting: Obstetrics & Gynecology

## 2014-05-04 ENCOUNTER — Ambulatory Visit (INDEPENDENT_AMBULATORY_CARE_PROVIDER_SITE_OTHER): Payer: BC Managed Care – PPO | Admitting: Certified Nurse Midwife

## 2014-05-04 ENCOUNTER — Encounter: Payer: Self-pay | Admitting: Certified Nurse Midwife

## 2014-05-04 VITALS — BP 110/64 | HR 68 | Resp 16 | Ht 68.0 in | Wt 132.0 lb

## 2014-05-04 DIAGNOSIS — N898 Other specified noninflammatory disorders of vagina: Secondary | ICD-10-CM

## 2014-05-04 DIAGNOSIS — B3731 Acute candidiasis of vulva and vagina: Secondary | ICD-10-CM

## 2014-05-04 DIAGNOSIS — L298 Other pruritus: Secondary | ICD-10-CM

## 2014-05-04 DIAGNOSIS — B373 Candidiasis of vulva and vagina: Secondary | ICD-10-CM

## 2014-05-04 DIAGNOSIS — N390 Urinary tract infection, site not specified: Secondary | ICD-10-CM

## 2014-05-04 LAB — POCT URINALYSIS DIPSTICK
Bilirubin, UA: NEGATIVE
Blood, UA: NEGATIVE
Glucose, UA: NEGATIVE
KETONES UA: NEGATIVE
LEUKOCYTES UA: NEGATIVE
Nitrite, UA: NEGATIVE
PROTEIN UA: NEGATIVE
Urobilinogen, UA: NEGATIVE
pH, UA: 5

## 2014-05-04 MED ORDER — FLUCONAZOLE 150 MG PO TABS: ORAL_TABLET | ORAL | Status: DC

## 2014-05-04 NOTE — Patient Instructions (Signed)

## 2014-05-04 NOTE — Progress Notes (Signed)
53 y.o. Divorced white female g2p2002 here with complaint of vaginal symptoms of itching, burning, and increase discharge. Describes discharge as minimal with no odor, but itching. Patient on last dose of Cipro for 10 days for UTI. All symptoms now have resolved. Needs TOC because she told PCP she could have done here.Onset of symptoms 3 days ago. Denies new personal products or vaginal dryness. No STD concerns. Urinary symptoms no urgency or frequency or pain now. No other health issues today. Marland Kitchen   O:Healthy female WDWN Affect: normal, orientation x 3  Exam:Skin: warm and dry CVAT:;negative bilateral Abdomen:negative suprapubic Lymph node: no enlargement or tenderness Pelvic exam: External genital: slight increase pink no scaling or exudate wet prep taken BUS: negative, Bladder and urethral meatus non tender Vagina: white thick non odorus discharge noted. Ph: 4.0  ,Wet prep taken Cervix: normal, non tender Uterus: normal, non tender Adnexa:normal, non tender, no masses or fullness noted   Wet Prep results: positive for yeast   A:Yeast vaginitis UTI probably resolved  P:Discussed findings of yeast vaginitis and etiology. Discussed Aveeno or baking soda sitz bath for comfort. Discussed probably due to long episode of antibiotics. Encouraged to start on oral refrigerated probiotic to help normal GI flora. Rx: Diflucan see order Aware of UTI warning signs and to advise if occurs. Lab: Urine micro/culture Given menses calendar for normal and abnormal parameters with period, per request.  Rv Prn  Rv prn

## 2014-05-05 ENCOUNTER — Other Ambulatory Visit: Payer: Self-pay | Admitting: Obstetrics & Gynecology

## 2014-05-05 LAB — URINALYSIS, MICROSCOPIC ONLY
Casts: NONE SEEN
Crystals: NONE SEEN

## 2014-05-06 LAB — URINE CULTURE
Colony Count: NO GROWTH
Organism ID, Bacteria: NO GROWTH

## 2014-05-06 NOTE — Progress Notes (Signed)
Reviewed personally.  M. Suzanne Keysha Damewood, MD.  

## 2014-05-07 NOTE — Telephone Encounter (Signed)
Last refilled: 11/10/13 #30/0 rfs Last AEX: 10/16/13 with Dr. Bernita Buffy Scheduled: 10/26/14 with Dr. Sabra Heck  Please Advise.

## 2014-05-08 NOTE — Telephone Encounter (Signed)
Rx printed, signed and faxed to [facility]

## 2014-05-14 ENCOUNTER — Other Ambulatory Visit: Payer: Self-pay | Admitting: Family Medicine

## 2014-06-18 ENCOUNTER — Ambulatory Visit (INDEPENDENT_AMBULATORY_CARE_PROVIDER_SITE_OTHER): Payer: BC Managed Care – PPO | Admitting: Family Medicine

## 2014-06-18 ENCOUNTER — Encounter: Payer: Self-pay | Admitting: Family Medicine

## 2014-06-18 VITALS — BP 112/64 | HR 79 | Temp 98.1°F | Ht 68.25 in | Wt 130.5 lb

## 2014-06-18 DIAGNOSIS — M546 Pain in thoracic spine: Secondary | ICD-10-CM

## 2014-06-18 MED ORDER — TRAMADOL HCL 50 MG PO TABS: 25.0000 mg | ORAL_TABLET | Freq: Three times a day (TID) | ORAL | Status: DC | PRN

## 2014-06-18 NOTE — Patient Instructions (Addendum)
BEFORE YOU LEAVE: -upper back exercises -schedule follow up with [name] in 3-4 weeks  -heat for 15 minutes twice daily  -flexeril every 12 hours as needed  -tylenol 500-1043m up to 3 times daily  -capsaicin or menthol topical sports creams can help too  -tramadol if needed for severe pain

## 2014-06-18 NOTE — Progress Notes (Signed)
Pre visit review using our clinic review tool, if applicable. No additional management support is needed unless otherwise documented below in the visit note. 

## 2014-06-18 NOTE — Progress Notes (Signed)
HPI:  Jill Mcintyre is a 53 yo F pt of [name] here for an acute visit for:  Shoulder Pain: -started 1 week ago after sitting without her pillow for a day at work -reports she thinks she needs "steroid shot in booty" -getting married in a few days -pain is mod - severe, sharp, with certain movements, ok at rest, located in L thoracic paraspinal muscles, worse with certain movements and standing, better with rest -denies: fevers, CP, SOB, radiation -reports took muscle relaxer from her PCP and has some of this for spasm in her low back that is chronic  - rarely takes temazepam  ROS: See pertinent positives and negatives per HPI.  Past Medical History  Diagnosis Date  . ALLERGIC RHINITIS   . Anxiety   . Bipolar depression     se[facility] Dr G[facility] Murdoch [facility] and sees Trey Paula for  therapy  . GERD (gastroesophageal reflux disease)   . Herpes simplex   . Constipation   . Anemia   . Acne     sees Dr. Jarome Matin for skin checks and acne  . HPV (human papilloma virus) infection   . Routine gynecological examination     sees Dr. Caren Griffins Romine   . IBS (irritable bowel syndrome)   . Bipolar 1 disorder   . Insomnia   . Migraines   . Abnormal Pap smear of cervix     Past Surgical History  Procedure Laterality Date  . Esophagogastroduodenoscopy  09/07/2006    Dr Ardis Hughs  . Breast enhancement surgery    . Basal cell carcinoma excision      Dr Amy Martinique  . Colposcopy  10/07/11    [facility]  . Cesarean section  1996    Family History  Problem Relation Age of Onset  . Alcohol abuse    . Arthritis    . Cancer      breast, ovrarian, uterine  . Cancer Mother     History   Social History  . Marital Status: Divorced    Spouse Name: N/A    Number of Children: N/A  . Years of Education: N/A   Social History Main Topics  . Smoking status: Former Smoker    Start date: 10/07/1982  . Smokeless tobacco: Never Used  . Alcohol Use: 1.8 - 2.4 oz/week    3-4  Not specified per week  . Drug Use: No  . Sexual Activity:    Partners: Male    Birth Control/ Protection: Pill     Comment: partner vasectomy   Other Topics Concern  . None   Social History Narrative    Current outpatient prescriptions: Biotin (BIOTIN 5000) 5 MG CAPS, Take 1,000 mg by mouth daily. , Disp: , Rfl: ;  diclofenac (VOLTAREN) 75 MG EC tablet, Take 1 tablet (75 mg total) by mouth 2 (two) times daily., Disp: 60 tablet, Rfl: 3;  lamoTRIgine (LAMICTAL) 100 MG tablet, Take 100 mg by mouth daily. , Disp: , Rfl: ;  lidocaine (XYLOCAINE JELLY) 2 % jelly, Apply topically 3 (three) times daily., Disp: 30 mL, Rfl: 11 Multiple Vitamin (MULTI-VITAMIN PO), Take by mouth daily., Disp: , Rfl: ;  Nutritional Supplements (ESTROVEN PO), Take by mouth daily., Disp: , Rfl: ;  Probiotic Product (PROBIOTIC DAILY PO), Take by mouth daily., Disp: , Rfl: ;  temazepam (RESTORIL) 15 MG capsule, TAKE ONE CAPSULE BY MOUTH NIGHTLY AT BEDTIME FOR SLEEP, Disp: 30 capsule, Rfl: 1 valACYclovir (VALTREX) 500 MG  tablet, Take 1 tablet (500 mg total) by mouth daily., Disp: 90 tablet, Rfl: 3;  furosemide (LASIX) 20 MG tablet, Take 1 tablet (20 mg total) by mouth daily as needed., Disp: 30 tablet, Rfl: 5;  traMADol (ULTRAM) 50 MG tablet, Take 0.5 tablets (25 mg total) by mouth every 8 (eight) hours as needed., Disp: 30 tablet, Rfl: 0  EXAM:  Filed Vitals:   06/18/14 1502  BP: 112/64  Pulse: 79  Temp: 98.1 F (36.7 C)    Body mass index is 19.69 kg/(m^2).  GENERAL: vitals reviewed and listed above, alert, oriented, appears well hydrated and in no acute distress  HEENT: atraumatic, conjunttiva clear, no obvious abnormalities on inspection of external nose and ears  NECK: no obvious masses on inspection  MS: moves all extremities without noticeable abnormality Normal gait, TTP in L mid and lower thoracic paraspinal muscles, no bony TTP  PSYCH: pleasant and cooperative, no obvious depression or  anxiety  ASSESSMENT AND PLAN:  Discussed the following assessment and plan:  Thoracic back pain, unspecified back pain laterality - Plan: traMADol (ULTRAM) 50 MG tablet  -we discussed possible serious and likely etiologies, workup and treatment, treatment risks and return precautions -after this discussion, [name] opted for muscle relaxer, heat, HEP, tylenol, ultram if needed if not responding to other measures - risks discussed -follow up advised in 3-4 weeks or sooner if worsening or other symptoms -of course, we advised Matraca  to return or notify a doctor immediately if symptoms worsen or persist or new concerns arise.  -Patient advised to return or notify a doctor immediately if symptoms worsen or persist or new concerns arise.  Patient Instructions  BEFORE YOU LEAVE: -upper back exercises -schedule follow up with [name] in 3-4 weeks  -heat for 15 minutes twice daily  -flexeril every 12 hours as needed  -tylenol 500-1045m up to 3 times daily  -capsaicin or menthol topical sports creams can help too  -tramadol if needed for severe pain       Jazae Gandolfi R.

## 2014-06-21 ENCOUNTER — Ambulatory Visit (INDEPENDENT_AMBULATORY_CARE_PROVIDER_SITE_OTHER): Payer: BC Managed Care – PPO | Admitting: Family Medicine

## 2014-06-21 ENCOUNTER — Encounter: Payer: Self-pay | Admitting: Family Medicine

## 2014-06-21 VITALS — BP 114/66 | HR 84 | Temp 98.3°F | Ht 68.25 in | Wt 126.0 lb

## 2014-06-21 DIAGNOSIS — B029 Zoster without complications: Secondary | ICD-10-CM

## 2014-06-21 MED ORDER — METHYLPREDNISOLONE ACETATE 80 MG/ML IJ SUSP
160.0000 mg | Freq: Once | INTRAMUSCULAR | Status: AC
  Administered 2014-06-21: 160 mg via INTRAMUSCULAR

## 2014-06-21 NOTE — Progress Notes (Signed)
Pre visit review using our clinic review tool, if applicable. No additional management support is needed unless otherwise documented below in the visit note. 

## 2014-06-21 NOTE — Progress Notes (Signed)
   Subjective:    Patient ID: Jill Mcintyre, female    DOB: 07-03-1960, 53 y.o.   MRN: [id number]  HPI Here for one week of burning pains in the left upper back and now 2 days of a rash in this area. Using Tramadol with no relief. She has been under tremendous stress lately in planning her wedding and in fact she is getting married tonight.    Review of Systems  Constitutional: Negative.   Musculoskeletal: Positive for back pain.  Skin: Positive for rash.       Objective:   Physical Exam  Constitutional: She appears well-developed and well-nourished.  Skin:  Patch of red vesicles over the left upper back           Assessment & Plan:  This is shingles. She has Valtrex at home so she will take 1000 mg of this TID for 10 days. Given a steroid shot today. Recheck prn

## 2014-06-21 NOTE — Addendum Note (Signed)
Addended by: Aggie Hacker A on: 06/21/2014 11:39 AM   Modules accepted: Orders

## 2014-06-28 ENCOUNTER — Other Ambulatory Visit: Payer: Self-pay | Admitting: Obstetrics & Gynecology

## 2014-06-28 NOTE — Telephone Encounter (Signed)
RX printed, signed by Dr. Sabra Heck and faxed at 9:09 a.m

## 2014-06-28 NOTE — Telephone Encounter (Signed)
Medication refill request: Temazepam 15 mg  Last AEX:  10/16/13 with Dr. Sabra Heck Next AEX: 10/26/14 with Dr. Sabra Heck Last MMG (if hormonal medication request): N/A Refill authorized: #30, Please advise.

## 2014-08-27 ENCOUNTER — Ambulatory Visit (INDEPENDENT_AMBULATORY_CARE_PROVIDER_SITE_OTHER): Payer: BLUE CROSS/BLUE SHIELD | Admitting: Family Medicine

## 2014-08-27 ENCOUNTER — Encounter: Payer: Self-pay | Admitting: Family Medicine

## 2014-08-27 VITALS — BP 103/74 | HR 73 | Temp 98.5°F | Ht 68.25 in | Wt 130.0 lb

## 2014-08-27 DIAGNOSIS — J069 Acute upper respiratory infection, unspecified: Secondary | ICD-10-CM

## 2014-08-27 NOTE — Progress Notes (Signed)
Pre visit review using our clinic review tool, if applicable. No additional management support is needed unless otherwise documented below in the visit note. 

## 2014-08-27 NOTE — Progress Notes (Signed)
   Subjective:    Patient ID: Jill Mcintyre, female    DOB: [birth year], 54 y.o.   MRN: [id number]  HPI Here to follow up an Urgent Care visit on 08-24-14 for chest pains and a deep dry cough. She had a fever for a day or two but this resolved. She was diagnosed with a virus infection and was given a few days of oral steroids. This helped a lot an she feels better now. She still has a mild cough and her voice is hoarse. No SOB. She also asks me to check her back and tell her if she has shingles again. She has had burning pains in the skin for several days.    Review of Systems  Constitutional: Negative.   HENT: Positive for congestion. Negative for postnasal drip and sinus pressure.   Eyes: Negative.   Respiratory: Positive for cough. Negative for shortness of breath and wheezing.   Cardiovascular: Negative.        Objective:   Physical Exam  Constitutional: She appears well-developed and well-nourished. No distress.  HENT:  Right Ear: External ear normal.  Left Ear: External ear normal.  Nose: Nose normal.  Mouth/Throat: Oropharynx is clear and moist.  Eyes: Conjunctivae are normal.  Cardiovascular: Normal rate, regular rhythm, normal heart sounds and intact distal pulses.   Pulmonary/Chest: Effort normal and breath sounds normal.  Lymphadenopathy:    She has no cervical adenopathy.  Skin: No rash noted.  Her back is clear           Assessment & Plan:  She is recovering from a viral URI, possibly RSV from her description. Reassured her she does not have shingles. Wrote her a note to be excused from work from 08-24-14 until 08-30-14.

## 2014-08-29 ENCOUNTER — Telehealth: Payer: Self-pay | Admitting: Family Medicine

## 2014-08-29 NOTE — Telephone Encounter (Signed)
Pt states she was here Monday and did not have a rash, but today she has the rash on her back, same spot as her shingles before.  Pt would like a cb.

## 2014-08-30 NOTE — Telephone Encounter (Signed)
I left a voice message for pt with below information, she has the 500 mg already. I advised that if she need a new script, then we can send in.

## 2014-08-30 NOTE — Telephone Encounter (Signed)
Just in case this is shingles, we will call in Valtrex 1000 mg tid for 10 days

## 2014-09-10 ENCOUNTER — Ambulatory Visit (INDEPENDENT_AMBULATORY_CARE_PROVIDER_SITE_OTHER): Payer: BLUE CROSS/BLUE SHIELD | Admitting: Internal Medicine

## 2014-09-10 ENCOUNTER — Encounter: Payer: Self-pay | Admitting: Internal Medicine

## 2014-09-10 VITALS — BP 142/80 | Temp 97.7°F | Ht 68.25 in | Wt 136.0 lb

## 2014-09-10 DIAGNOSIS — R300 Dysuria: Secondary | ICD-10-CM

## 2014-09-10 DIAGNOSIS — N390 Urinary tract infection, site not specified: Secondary | ICD-10-CM

## 2014-09-10 MED ORDER — NITROFURANTOIN MONOHYD MACRO 100 MG PO CAPS: 100.0000 mg | ORAL_CAPSULE | Freq: Two times a day (BID) | ORAL | Status: DC

## 2014-09-10 MED ORDER — FLUCONAZOLE 150 MG PO TABS: 150.0000 mg | ORAL_TABLET | Freq: Once | ORAL | Status: DC

## 2014-09-10 NOTE — Patient Instructions (Addendum)
Treatment for Uti.   Can add diflucan if needed or OTC .   Will get  A culture test and let you know .  Urinary Tract Infection Urinary tract infections (UTIs) can develop anywhere along your urinary tract. Your urinary tract is your body's drainage system for removing wastes and extra water. Your urinary tract includes two kidneys, two ureters, a bladder, and a urethra. Your kidneys are a pair of bean-shaped organs. Each kidney is about the size of your fist. They are located below your ribs, one on each side of your spine. CAUSES Infections are caused by microbes, which are microscopic organisms, including fungi, viruses, and bacteria. These organisms are so small that they can only be seen through a microscope. Bacteria are the microbes that most commonly cause UTIs. SYMPTOMS  Symptoms of UTIs may vary by age and gender of the patient and by the location of the infection. Symptoms in young women typically include a frequent and intense urge to urinate and a painful, burning feeling in the bladder or urethra during urination. Older women and men are more likely to be tired, shaky, and weak and have muscle aches and abdominal pain. A fever may mean the infection is in your kidneys. Other symptoms of a kidney infection include pain in your back or sides below the ribs, nausea, and vomiting. DIAGNOSIS To diagnose a UTI, your caregiver will ask you about your symptoms. Your caregiver also will ask to provide a urine sample. The urine sample will be tested for bacteria and white blood cells. White blood cells are made by your body to help fight infection. TREATMENT  Typically, UTIs can be treated with medication. Because most UTIs are caused by a bacterial infection, they usually can be treated with the use of antibiotics. The choice of antibiotic and length of treatment depend on your symptoms and the type of bacteria causing your infection. HOME CARE INSTRUCTIONS  If you were prescribed antibiotics,  take them exactly as your caregiver instructs you. Finish the medication even if you feel better after you have only taken some of the medication.  Drink enough water and fluids to keep your urine clear or pale yellow.  Avoid caffeine, tea, and carbonated beverages. They tend to irritate your bladder.  Empty your bladder often. Avoid holding urine for long periods of time.  Empty your bladder before and after sexual intercourse.  After a bowel movement, women should cleanse from front to back. Use each tissue only once. SEEK MEDICAL CARE IF:   You have back pain.  You develop a fever.  Your symptoms do not begin to resolve within 3 days. SEEK IMMEDIATE MEDICAL CARE IF:   You have severe back pain or lower abdominal pain.  You develop chills.  You have nausea or vomiting.  You have continued burning or discomfort with urination. MAKE SURE YOU:   Understand these instructions.  Will watch your condition.  Will get help right away if you are not doing well or get worse. Document Released: 03/18/2005 Document Revised: 12/08/2011 Document Reviewed: 07/17/2011 [facility] Patient Information 2015 [place], [place]. This information is not intended to replace advice given to you by your health care provider. Make sure you discuss any questions you have with your health care provider.

## 2014-09-10 NOTE — Progress Notes (Signed)
Pre visit review using our clinic review tool, if applicable. No additional management support is needed unless otherwise documented below in the visit note.  Chief Complaint  Patient presents with  . Dysuria    3 days    HPI: Patient Jill Mcintyre  comes in today for SDA for  new problem evaluation. PCP NA Onset 3-4 days ago dysuria and back pain   Some better today .  Took azo recently . No hematuria chills fever  Hx of utis  More recently   Gest yeast infections asks for diflucan if antibiotic rx ed  ROS: See pertinent positives and negatives per HPI.  Past Medical History  Diagnosis Date  . ALLERGIC RHINITIS   . Anxiety   . Bipolar depression     sees Dr Gala Murdoch [facility] and sees Trey Paula for  therapy  . GERD (gastroesophageal reflux disease)   . Herpes simplex   . Constipation   . Anemia   . Acne     sees Dr. Jarome Matin for skin checks and acne  . HPV (human papilloma virus) infection   . Routine gynecological examination     sees Dr. Caren Griffins Romine   . IBS (irritable bowel syndrome)   . Bipolar 1 disorder   . Insomnia   . Migraines   . Abnormal Pap smear of cervix     Family History  Problem Relation Age of Onset  . Alcohol abuse    . Arthritis    . Cancer      breast, ovrarian, uterine  . Cancer Mother     History   Social History  . Marital Status: Divorced    Spouse Name: N/A  . Number of Children: N/A  . Years of Education: N/A   Social History Main Topics  . Smoking status: Former Smoker    Start date: 10/07/1982  . Smokeless tobacco: Never Used  . Alcohol Use: 0.0 oz/week    0 Standard drinks or equivalent per week     Comment: occ  . Drug Use: No  . Sexual Activity:    Partners: Male    Birth Control/ Protection: Pill     Comment: partner vasectomy   Other Topics Concern  . None   Social History Narrative    Outpatient Encounter Prescriptions as of 09/10/2014  Medication Sig  . Biotin (BIOTIN 5000)  5 MG CAPS Take 1,000 mg by mouth daily.   . diclofenac (VOLTAREN) 75 MG EC tablet Take 1 tablet (75 mg total) by mouth 2 (two) times daily.  Marland Kitchen lamoTRIgine (LAMICTAL) 100 MG tablet Take 100 mg by mouth daily.   Marland Kitchen lidocaine (XYLOCAINE JELLY) 2 % jelly Apply topically 3 (three) times daily.  . Multiple Vitamin (MULTI-VITAMIN PO) Take by mouth daily.  . Nutritional Supplements (ESTROVEN PO) Take by mouth daily.  . Probiotic Product (PROBIOTIC DAILY PO) Take by mouth daily.  . temazepam (RESTORIL) 15 MG capsule TAKE ONE CAPSULE BY MOUTH NIGHTLY AT BEDTIME for sleep  . traMADol (ULTRAM) 50 MG tablet Take 0.5 tablets (25 mg total) by mouth every 8 (eight) hours as needed.  . valACYclovir (VALTREX) 500 MG tablet Take 1 tablet (500 mg total) by mouth daily.  . fluconazole (DIFLUCAN) 150 MG tablet Take 1 tablet (150 mg total) by mouth once. If needed  . furosemide (LASIX) 20 MG tablet Take 1 tablet (20 mg total) by mouth daily as needed.  . nitrofurantoin, macrocrystal-monohydrate, (MACROBID) 100 MG capsule Take 1  capsule (100 mg total) by mouth 2 (two) times daily.    EXAM:  BP 142/80 mmHg  Temp(Src) 97.7 F (36.5 C) (Oral)  Ht 5' 8.25" (1.734 m)  Wt 136 lb (61.689 kg)  BMI 20.52 kg/m2  LMP 08/17/2014 (Exact Date)  Body mass index is 20.52 kg/(m^2).  GENERAL: vitals reviewed and listed above, alert, oriented, appears well hydrated and in no acute distress HEENT: atraumatic, conjunctiva  clear, no obvious abnormalities on inspection of external nose and ears CV: HRRR, no clubbing cyanosis or  peripheral edema nl cap refill  Abdomen:  Sof,t normal bowel sounds without hepatosplenomegaly, no guarding rebound or masses no CVA tenderness MS: moves all extremities without noticeable focal  abnormality PSYCH: pleasant and cooperative, no obvious depression or anxiety  ASSESSMENT AND PLAN:  Discussed the following assessment and plan:  Dysuria - sx sounds like acute uti improved today with hx of  same  - Plan: POC Urinalysis Dipstick, Culture, Urine  Urinary tract infection without hematuria, site unspecified empiric rx and cx  Diflucan or otc 3 days conazole if needed  -Patient advised to return or notify health care team  if symptoms worsen ,persist or new concerns arise.  Patient Instructions  Treatment for Uti.   Can add diflucan if needed or OTC .   Will get  A culture test and let you know .  Urinary Tract Infection Urinary tract infections (UTIs) can develop anywhere along your urinary tract. Your urinary tract is your body's drainage system for removing wastes and extra water. Your urinary tract includes two kidneys, two ureters, a bladder, and a urethra. Your kidneys are a pair of bean-shaped organs. Each kidney is about the size of your fist. They are located below your ribs, one on each side of your spine. CAUSES Infections are caused by microbes, which are microscopic organisms, including fungi, viruses, and bacteria. These organisms are so small that they can only be seen through a microscope. Bacteria are the microbes that most commonly cause UTIs. SYMPTOMS  Symptoms of UTIs may vary by age and gender of the patient and by the location of the infection. Symptoms in young women typically include a frequent and intense urge to urinate and a painful, burning feeling in the bladder or urethra during urination. Older women and men are more likely to be tired, shaky, and weak and have muscle aches and abdominal pain. A fever may mean the infection is in your kidneys. Other symptoms of a kidney infection include pain in your back or sides below the ribs, nausea, and vomiting. DIAGNOSIS To diagnose a UTI, your caregiver will ask you about your symptoms. Your caregiver also will ask to provide a urine sample. The urine sample will be tested for bacteria and white blood cells. White blood cells are made by your body to help fight infection. TREATMENT  Typically, UTIs can be treated  with medication. Because most UTIs are caused by a bacterial infection, they usually can be treated with the use of antibiotics. The choice of antibiotic and length of treatment depend on your symptoms and the type of bacteria causing your infection. HOME CARE INSTRUCTIONS  If you were prescribed antibiotics, take them exactly as your caregiver instructs you. Finish the medication even if you feel better after you have only taken some of the medication.  Drink enough water and fluids to keep your urine clear or pale yellow.  Avoid caffeine, tea, and carbonated beverages. They tend to irritate your bladder.  Empty  your bladder often. Avoid holding urine for long periods of time.  Empty your bladder before and after sexual intercourse.  After a bowel movement, women should cleanse from front to back. Use each tissue only once. SEEK MEDICAL CARE IF:   You have back pain.  You develop a fever.  Your symptoms do not begin to resolve within 3 days. SEEK IMMEDIATE MEDICAL CARE IF:   You have severe back pain or lower abdominal pain.  You develop chills.  You have nausea or vomiting.  You have continued burning or discomfort with urination. MAKE SURE YOU:   Understand these instructions.  Will watch your condition.  Will get help right away if you are not doing well or get worse. Document Released: 03/18/2005 Document Revised: 12/08/2011 Document Reviewed: 07/17/2011 [facility] Patient Information 2015 [place], [place]. This information is not intended to replace advice given to you by your health care provider. Make sure you discuss any questions you have with your health care provider.      Standley Brooking. Evalette Montrose M.D.

## 2014-09-12 LAB — URINE CULTURE: Colony Count: >=100000

## 2014-09-13 NOTE — Progress Notes (Signed)
Quick Note:  Tell patient that urine culture shows e coli sensitive to medication given . Should resolve with current treatment .FU if not better. ______ 

## 2014-09-17 ENCOUNTER — Telehealth: Payer: Self-pay | Admitting: Obstetrics & Gynecology

## 2014-09-17 DIAGNOSIS — N950 Postmenopausal bleeding: Secondary | ICD-10-CM

## 2014-09-17 NOTE — Telephone Encounter (Signed)
Spoke with patient. She is requesting a consult to discuss ablation. Patient states since September 2015 she has been having very heavy cycles that have lasted 10-12 days. States she is bleeding heavily and soiling clothes. She is not using HRT. Estroven only.  States she started bleeding on Friday 09/14/14 and bleeding is ongoing now, using a liner and a tampon and changing q 2-3 hours. Patient denies pelvic pain.  AMH done 06/14/13 < 0.03. Patient is leaving in one hour to go to [place] and will not be back until Wednesday evening.   Advised patient may need ultrasound and Endometrial biopsy to evaluate for this heavy bleeding while post menopausal. Patient feels this may be "overkill" and would like to come in to consult with Dr. Sabra Heck only. Advised patient office visit scheduled for 09/20/14 at 1530 (for possible ultrasound) and 1600 office visit with Dr. Sabra Heck. Will call back with recommendations from Dr. Sabra Heck.

## 2014-09-17 NOTE — Telephone Encounter (Signed)
Patient wants to discuss with Dr. Jeanie Cooks  having an ablation.

## 2014-09-17 NOTE — Telephone Encounter (Signed)
Reviewed with Dr. Sabra Heck.  Orders for Endometrial biopsy, Pelvic ultrasound, and Aygestin 5 mg BID received.   Called and spoke with patient. She declines order for Aygestin at this time. She states "I have everything covered, I'll be fine."  Patient agreeable to plan for Pelvic ultrasound and Endometrial biopsy, advised will be done at office visit for evaluation of post menopausal bleeding with Dr. Sabra Heck. Advised patient to call back or seek immediate medical care if bleeding worsens or soaking through 1 pad/tampon per hour for two hours or if becomes symptomatic with sob, chest pain, fatigued, lightheaded, or weakness.  Patient verbalized understanding. She states again that she has symptoms managed and that she will call back if has any problems or decides would like to have medication ordered to [place].   Orders placed for  insurance pre certification.   Routing to provider for final review. Patient agreeable to disposition. Will close encounter   cc Felipa Emory

## 2014-09-18 NOTE — Telephone Encounter (Signed)
Call to patient. Advised of benefit quote received for PUS/EMB.  Patient agreeable.

## 2014-09-19 ENCOUNTER — Telehealth: Payer: Self-pay | Admitting: Obstetrics & Gynecology

## 2014-09-19 NOTE — Telephone Encounter (Signed)
Pt is out of town and will not be back for ultrasound appointment. Please call to reschedule.

## 2014-09-19 NOTE — Telephone Encounter (Signed)
Spoke with patient. Appointment rescheduled for 4/7 at 1:30pm with 2pm consult with Dr.Miller. Patient is agreeable to date and time.  Routing to provider for final review. Patient agreeable to disposition. Will close encounter

## 2014-09-20 ENCOUNTER — Ambulatory Visit: Payer: Self-pay | Admitting: Obstetrics & Gynecology

## 2014-09-20 ENCOUNTER — Other Ambulatory Visit: Payer: Self-pay

## 2014-09-20 ENCOUNTER — Other Ambulatory Visit: Payer: Self-pay | Admitting: Obstetrics & Gynecology

## 2014-09-26 ENCOUNTER — Telehealth: Payer: Self-pay | Admitting: Obstetrics & Gynecology

## 2014-09-26 NOTE — Telephone Encounter (Signed)
Patient called back, would like to keep appointment tomorrow.

## 2014-09-26 NOTE — Telephone Encounter (Signed)
Patent need to reschedule PUS/EMB tomorrow. Patient is still out of town and will not be back in time for appointment tomorrow. Jill Mcintyre to reschedule. Staff message sent to [facility] also.

## 2014-09-27 ENCOUNTER — Ambulatory Visit (INDEPENDENT_AMBULATORY_CARE_PROVIDER_SITE_OTHER): Payer: BLUE CROSS/BLUE SHIELD

## 2014-09-27 ENCOUNTER — Other Ambulatory Visit: Payer: BLUE CROSS/BLUE SHIELD | Admitting: Obstetrics & Gynecology

## 2014-09-27 ENCOUNTER — Other Ambulatory Visit: Payer: BLUE CROSS/BLUE SHIELD

## 2014-09-27 ENCOUNTER — Ambulatory Visit (INDEPENDENT_AMBULATORY_CARE_PROVIDER_SITE_OTHER): Payer: BLUE CROSS/BLUE SHIELD | Admitting: Obstetrics & Gynecology

## 2014-09-27 VITALS — BP 118/78 | Wt 134.0 lb

## 2014-09-27 DIAGNOSIS — N950 Postmenopausal bleeding: Secondary | ICD-10-CM

## 2014-09-27 DIAGNOSIS — D509 Iron deficiency anemia, unspecified: Secondary | ICD-10-CM

## 2014-09-27 DIAGNOSIS — K900 Celiac disease: Secondary | ICD-10-CM

## 2014-09-27 NOTE — Progress Notes (Signed)
54 y.o.Divorcedfemale here for a pelvic ultrasound due to PMP bleeding.  Pt seen in 12/14 for vaginitis symptoms.  Due to exam findings, AMH was obtained.  Value was <0.03.  Pt was on OCPs at that time and these were stopped.  Pt started having menopausal symptoms shortly after stopping HRT.  She was seen 08/31/13 for these issues.  Decision was made to no proceed with HRT use.  Pt had recently been found to have a Hb of 8 range.  Pt did proceed with colonoscopy and endoscopy 10/15 which showed no malignancy.  Pt reports in Sept/Oct of last fall, bleeding returned after almost a year without any.  First real cycle was November lasted about 8 days.  A few of these were heavy.  December's cycle was longer with some heavy days as well.  She did cycle in Jan, Feb, and March but these seemed very normal and much lighter.  During this time, her hot flashes significantly improved as well.  She is beginning to feel some hot flashes again, however.  Due to change in bleeding, after pt called in late March to advise of change, PUS with possible biopsy was recommended.  Pt is VERY anxious about having a biopsy today.    Sexually active:  yes  Contraception: no method  FINDINGS: UTERUS: 9.3 x 4.8 x 4.0cm EMS: 5.17m, symmetric ADNEXA:   Left ovary 1.4 x 0.6 x 0.9cm   Right ovary 2.1 x 0.9 x 1.3cm CUL DE SAC: no free fluid  Findings discussed with pt.  As she is very anxious about proceeding with a biopsy, feel can repeat menopausal hormonal testing to see where she is before deciding about biopsy.  As well, feel pt should have iron testing and hemoglobin to see where she is right now since bleeding has restarted.  Pt does not want to be back on hormonal therapy or OCPs if at all possible.    Assessment:  Perimenopausal bleeding Long standing hx of anemia Celiac disease  Plan: FSH and estradiol, iron and ferritin and CBC Consider iron infusion if iron stores are low and/or hemoglobin is still low  ~20  minutes spent with patient >50% of time was in face to face discussion of above.

## 2014-09-28 LAB — FERRITIN: FERRITIN: 7 ng/mL — AB (ref 10–291)

## 2014-09-28 LAB — ESTRADIOL: Estradiol: 35.6 pg/mL

## 2014-09-28 LAB — IRON: IRON: 66 ug/dL (ref 42–145)

## 2014-09-28 LAB — FOLLICLE STIMULATING HORMONE: FSH: 63.7 m[IU]/mL

## 2014-09-28 LAB — VITAMIN B12: VITAMIN B 12: 721 pg/mL (ref 211–911)

## 2014-10-02 ENCOUNTER — Telehealth: Payer: Self-pay | Admitting: Obstetrics & Gynecology

## 2014-10-02 NOTE — Telephone Encounter (Signed)
Pt called for lab results from Thursday visit with Dr Sabra Heck.  Best: [phone]

## 2014-10-02 NOTE — Telephone Encounter (Signed)
Dr.Miller, please review and advised lab results from 09/27/2014.

## 2014-10-03 NOTE — Telephone Encounter (Signed)
Spoke with patient and message from Dr. Sabra Heck given. Patient agreeable to instructions. She will call back if any further bleeding for appointment for Endometrial biopsy.  Routing to provider for final review. Patient agreeable to disposition. Will close encounter

## 2014-10-03 NOTE — Telephone Encounter (Signed)
I routed you the lab results.

## 2014-10-03 NOTE — Telephone Encounter (Signed)
-----   Message from Megan Salon, MD sent at 10/03/2014 10:24 AM EDT ----- I routed note to kelly.  Please refer to that for results.  Claiborne Billings, please don't call pt.

## 2014-10-03 NOTE — Telephone Encounter (Signed)
Message left to return call to Citlaly Camplin at [phone].    

## 2014-10-03 NOTE — Telephone Encounter (Signed)
Notes Recorded by Megan Salon, MD on 10/03/2014 at 10:24 AM I routed note to kelly. Please refer to that for results. Claiborne Billings, please don't call pt. Notes Recorded by Megan Salon, MD on 10/03/2014 at 10:21 AM Please inform pt that her [facility] is 82 and estradiol is low at 35. This shows menopause which means she should not have any more bleeding. If she does, I will need to do a biopsy and then we can discuss endometrial ablation at that visit.  B12 is normal. Iron level is normal but ferritin is low. If she doesn't have more bleeding, this will improve so I want to wait and see what happens. She can start OTC iron if this doesn't cause her constipation. Take daily.

## 2014-10-07 ENCOUNTER — Encounter: Payer: Self-pay | Admitting: Obstetrics & Gynecology

## 2014-10-07 DIAGNOSIS — K900 Celiac disease: Secondary | ICD-10-CM | POA: Insufficient documentation

## 2014-10-08 ENCOUNTER — Other Ambulatory Visit: Payer: Self-pay | Admitting: Family Medicine

## 2014-10-09 NOTE — Telephone Encounter (Signed)
Call in #[phone] with 2 rf

## 2014-10-17 ENCOUNTER — Encounter: Payer: Self-pay | Admitting: Certified Nurse Midwife

## 2014-10-17 ENCOUNTER — Ambulatory Visit (INDEPENDENT_AMBULATORY_CARE_PROVIDER_SITE_OTHER): Payer: BLUE CROSS/BLUE SHIELD | Admitting: Certified Nurse Midwife

## 2014-10-17 VITALS — BP 114/68 | Temp 97.5°F | Ht 68.25 in | Wt 132.8 lb

## 2014-10-17 DIAGNOSIS — N390 Urinary tract infection, site not specified: Secondary | ICD-10-CM | POA: Diagnosis not present

## 2014-10-17 DIAGNOSIS — R3915 Urgency of urination: Secondary | ICD-10-CM | POA: Diagnosis not present

## 2014-10-17 LAB — POCT URINALYSIS DIPSTICK
PH UA: 5
Urobilinogen, UA: NEGATIVE

## 2014-10-17 MED ORDER — NITROFURANTOIN MONOHYD MACRO 100 MG PO CAPS: 100.0000 mg | ORAL_CAPSULE | Freq: Two times a day (BID) | ORAL | Status: DC

## 2014-10-17 NOTE — Progress Notes (Signed)
54 y.o. Married Caucasian g2p2 here with complaint of UTI, with onset  on 5 days. Patient complaining of urinary frequency/urgency/ and pain with urination. Patient denies fever, chills, nausea or back pain. No new personal products, did use. Patient feels related to sexual activity. Denies any vaginal symptoms. Menopausal with no vaginal dryness. Patient consuming adequate water intake. Patient is also having some incontinence with running only. Patient had UTI 3 months ago after sexual activity again. No other health issues today.   O: Healthy female WDWN Affect: Normal, orientation x 3 Skin : warm and dry CVAT: negative bilateral Abdomen: positive for suprapubic tenderness, abdomen soft  Pelvic exam: External genital area: normal, no lesions Bladder,Urethra tender, Urethral meatus: tender, red Vagina: normal vaginal discharge, normal appearance  Wet prep not taken Cervix: normal, non tender Uterus:normal,non tender Adnexa: normal non tender, no fullness or masses   A: UTI History of post coital with E. Coli culture Normal pelvic exam  P: Reviewed findings of UTI and need for treatment. VG:KKDPTELM see order Azo per OTC instructions( patient prefers) RAJ:HHIDU micro, culture Reviewed warning signs and symptoms of UTI and need to advise if occurring. Encouraged to limit soda, tea, and coffee. Discussed after culture in, consider prophylactic treatment with Macrobid after sexual activity. Patient would like to try. Discussed importance of emptying bladder after sexual activity also and increase water intake. Questions addressed.   RV prn

## 2014-10-17 NOTE — Patient Instructions (Signed)

## 2014-10-18 LAB — URINALYSIS, MICROSCOPIC ONLY
BACTERIA UA: NONE SEEN
CRYSTALS: NONE SEEN
Casts: NONE SEEN

## 2014-10-19 NOTE — Progress Notes (Signed)
Encounter reviewed by Dr. Bonnell Placzek Silva.  

## 2014-10-20 LAB — URINE CULTURE: Colony Count: 75000

## 2014-10-23 ENCOUNTER — Telehealth: Payer: Self-pay | Admitting: Obstetrics & Gynecology

## 2014-10-23 ENCOUNTER — Ambulatory Visit: Payer: BLUE CROSS/BLUE SHIELD | Admitting: Family Medicine

## 2014-10-23 NOTE — Telephone Encounter (Signed)
Pt cancelled aex with dr Sabra Heck for this Friday 10/26/14. Pt will be out of town. Pt states she has been in a lot lately and is not sure if she needs to reschedule aex. Pt would like to know if Dr Sabra Heck thinks she should reschedule. Pt agreeable to see NP if necessary.

## 2014-10-23 NOTE — Telephone Encounter (Signed)
Pt returned call

## 2014-10-23 NOTE — Telephone Encounter (Signed)
Spoke with patient. Advised of message as seen below from [place]. Patient is agreeable and verbalizes understanding. [facility] at this time. Would like to return call to schedule annual with Dr.Miller after completing mammogram and as it gets closer to 6 months.  Routing to provider for final review. Patient agreeable to disposition. Patient aware MD will review message and nurse will return call with any additional instructions or change of disposition. Will close encounter.

## 2014-10-23 NOTE — Telephone Encounter (Signed)
Returning call.

## 2014-10-23 NOTE — Telephone Encounter (Signed)
Left message to call Kaitlyn at [phone]. 

## 2014-10-23 NOTE — Telephone Encounter (Signed)
Ok for her to wait for AEX.  She has been seen a lot and had recent PUS.  Pap normal with neg HR HPV 4/15.  Pt has not has MMG since 2012 so would very much like her to have one before her next appt.  Can reschedule AEX with me or with DL in 4-6 months if this would work for her.  Thanks.

## 2014-10-23 NOTE — Telephone Encounter (Signed)
Patient was seen on 09-27-2014 for PUS with Dr.Miller due to PMB. At this appointment [place], Estradiol, Iron, Ferritin, and B12 were checked. Patient seen on 4/27 with Regina Eck CNM and treated for E.Coli UTI. Had pelvic exam at that appointment. Does not currently have TOC appointment scheduled. Patient wondering if she needs aex at this time or if she can wait.  Routing to [place] for review and advise.

## 2014-10-24 ENCOUNTER — Other Ambulatory Visit: Payer: Self-pay | Admitting: Certified Nurse Midwife

## 2014-10-24 DIAGNOSIS — N390 Urinary tract infection, site not specified: Secondary | ICD-10-CM

## 2014-10-24 MED ORDER — NITROFURANTOIN MONOHYD MACRO 100 MG PO CAPS: ORAL_CAPSULE | ORAL | Status: DC

## 2014-10-26 ENCOUNTER — Ambulatory Visit: Payer: BC Managed Care – PPO | Admitting: Obstetrics & Gynecology

## 2014-12-03 ENCOUNTER — Telehealth: Payer: Self-pay | Admitting: Family Medicine

## 2014-12-03 NOTE — Telephone Encounter (Signed)
Pt is having back pains and would like to know if md will order xray. Please advise

## 2014-12-03 NOTE — Telephone Encounter (Signed)
Pt has been sch for 12-04-14

## 2014-12-03 NOTE — Telephone Encounter (Signed)
Pt will need to schedule a office visit to discuss this.

## 2014-12-04 ENCOUNTER — Encounter: Payer: Self-pay | Admitting: Family Medicine

## 2014-12-04 ENCOUNTER — Other Ambulatory Visit: Payer: Self-pay | Admitting: Family Medicine

## 2014-12-04 ENCOUNTER — Ambulatory Visit (INDEPENDENT_AMBULATORY_CARE_PROVIDER_SITE_OTHER): Payer: BLUE CROSS/BLUE SHIELD | Admitting: Family Medicine

## 2014-12-04 VITALS — BP 104/71 | HR 74 | Temp 98.6°F | Ht 68.25 in | Wt 130.0 lb

## 2014-12-04 DIAGNOSIS — M4120 Other idiopathic scoliosis, site unspecified: Secondary | ICD-10-CM

## 2014-12-04 NOTE — Progress Notes (Signed)
   Subjective:    Patient ID: Jill Mcintyre, female    DOB: Nov 11, 1960, 54 y.o.   MRN: [id number]  HPI Here for chronic back pain that as worsened over the past 6 months. This involves the entire spine from the neck to the pelvis. She has known scoliosis. She takes Diclofenac and some occasional Tramadol with partial relief.   Review of Systems  Constitutional: Negative.   Musculoskeletal: Positive for back pain, neck pain and neck stiffness.       Objective:   Physical Exam  Constitutional: She appears well-developed and well-nourished.  Musculoskeletal:  She has some mild lateral scoliosis of the thoracic spine, some rotational scoliosis of the thoracic spine, and some flattening of the lumbar lordotic curve           Assessment & Plan:  Spinal pain. We will refer her to Orthopedics

## 2014-12-04 NOTE — Progress Notes (Signed)
Pre visit review using our clinic review tool, if applicable. No additional management support is needed unless otherwise documented below in the visit note. 

## 2014-12-06 ENCOUNTER — Telehealth: Payer: Self-pay | Admitting: Obstetrics & Gynecology

## 2014-12-06 MED ORDER — SERTRALINE HCL 50 MG PO TABS: 50.0000 mg | ORAL_TABLET | Freq: Every day | ORAL | Status: DC

## 2014-12-06 NOTE — Telephone Encounter (Signed)
Patient is having menopausal symptoms. Irritability and crying a lot.

## 2014-12-06 NOTE — Telephone Encounter (Signed)
No.  She could start an SSRI, like prozac or zoloft which can help the emotional symptoms that can accompany menopause as well as the hot flashes.  If ok with this, can start with Zoloft 68m daily.  She should give an update in two weeks to see if dosage should be adjusted.  If not interested in this, next option is HRT.

## 2014-12-06 NOTE — Telephone Encounter (Signed)
Spoke with patient. Advised patient of message as seen below form Dr.Miller. Patient is agreeable and verbalizes understanding. Would like to Zoloft 59m daily at this time. Rx for Zoloft 515mdaily #30 0RF sent to pharmacy on file. Patient is agreeable and will call in 2 weeks with update.  Routing to provider for final review. Patient agreeable to disposition. Will close encounter.   Patient aware provider will review message and nurse will return call if any additional advice or change of disposition.

## 2014-12-06 NOTE — Telephone Encounter (Signed)
Spoke with patient. Patient states that she has had increased irritability, crying episodes, night sweats, and hot flashes. Is currently taking OTC Estroven daily. States Hoy Register has been working well until 3 weeks ago. Patient is requesting alternative recommendations. "I really do not want to go on a prescription for Estrogen. Is that my only other option?" Advised there are other OTC options such as Editor, commissioning. Advised will speak with Dr.Miller regarding recommendations and return call. Patient is agreeable.

## 2014-12-10 ENCOUNTER — Other Ambulatory Visit: Payer: Self-pay | Admitting: Obstetrics & Gynecology

## 2014-12-10 NOTE — Telephone Encounter (Signed)
Patient is asking for a refill of Temazepam. Confirmed pharmacy on file with patient.

## 2014-12-10 NOTE — Telephone Encounter (Signed)
Medication refill request: Temazepam Last AEX:  10-16-13  Next AEX: Not scheduled Last office visit: 10-16-14  Last MMG (if hormonal medication request): 10-30-10 WNL  Refill authorized: please advise

## 2014-12-12 ENCOUNTER — Other Ambulatory Visit: Payer: Self-pay | Admitting: Orthopedic Surgery

## 2014-12-12 DIAGNOSIS — M25519 Pain in unspecified shoulder: Secondary | ICD-10-CM

## 2014-12-12 NOTE — Telephone Encounter (Signed)
Call to patient. Advised that her request has been sent to Dr. Sabra Heck, waiting for approval or denial.  Advised patient would contact her with update when Dr. Sabra Heck reviews when she is back in the office. Patient agreeable.   To refill pool or Margaretha Sheffield, can you please contact patient with approval or denial when Dr. Migdalia Dk responds?

## 2014-12-12 NOTE — Telephone Encounter (Signed)
Patient went to pick up Temazepam at the pharmacy on file. Patient did get the Zoloft but did not receive the Temazepam. Please call patient.

## 2014-12-13 MED ORDER — TEMAZEPAM 15 MG PO CAPS: ORAL_CAPSULE | ORAL | Status: DC

## 2014-12-13 NOTE — Telephone Encounter (Signed)
Rx faxed to [facility] [place], LM on patient's vm that rx hs been sent to pharmacy.

## 2014-12-14 ENCOUNTER — Other Ambulatory Visit: Payer: Self-pay | Admitting: Orthopedic Surgery

## 2014-12-14 DIAGNOSIS — M25519 Pain in unspecified shoulder: Secondary | ICD-10-CM

## 2014-12-17 ENCOUNTER — Telehealth: Payer: Self-pay | Admitting: Obstetrics & Gynecology

## 2014-12-17 NOTE — Telephone Encounter (Signed)
Patient is as asking for a consult regarding her menopausal symptoms. Patient is asking to see Dr.Miller.

## 2014-12-17 NOTE — Telephone Encounter (Signed)
Spoke with patient. Patient states that she has been taking Zoloft 45m daily for a little over a week for menopausal symptoms. Patient states that she has felt less emotional but is still having symptoms. "I know it takes a while to get in your system but I do not feel like myself." Requesting appointment to speak with Dr.Miller regarding symptoms. Appointment scheduled for 6/28 at 2:45pm with Dr.Miller. Patient is agreeable to date and time.  Routing to provider for final review. Patient agreeable to disposition. Will close encounter.   Patient aware provider will review message and nurse will return call if any additional advice or change of disposition.

## 2014-12-17 NOTE — Telephone Encounter (Signed)
Returned call

## 2014-12-17 NOTE — Telephone Encounter (Signed)
Left message to call Kaitlyn at [phone]. 

## 2014-12-18 ENCOUNTER — Ambulatory Visit (INDEPENDENT_AMBULATORY_CARE_PROVIDER_SITE_OTHER): Payer: BLUE CROSS/BLUE SHIELD | Admitting: Obstetrics & Gynecology

## 2014-12-18 VITALS — BP 120/72 | HR 72 | Resp 16 | Ht 68.25 in | Wt 132.0 lb

## 2014-12-18 DIAGNOSIS — F319 Bipolar disorder, unspecified: Secondary | ICD-10-CM

## 2014-12-18 DIAGNOSIS — F411 Generalized anxiety disorder: Secondary | ICD-10-CM

## 2014-12-18 DIAGNOSIS — N951 Menopausal and female climacteric states: Secondary | ICD-10-CM | POA: Diagnosis not present
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
## 2014-12-19 LAB — TESTOSTERONE: TESTOSTERONE: 26 ng/dL (ref 10–70)

## 2014-12-19 LAB — ESTRADIOL

## 2014-12-19 LAB — FOLLICLE STIMULATING HORMONE: FSH: 124.1 m[IU]/mL — ABNORMAL HIGH

## 2014-12-20 ENCOUNTER — Other Ambulatory Visit: Payer: Self-pay | Admitting: Orthopedic Surgery

## 2014-12-20 ENCOUNTER — Encounter: Payer: Self-pay | Admitting: Obstetrics & Gynecology

## 2014-12-20 ENCOUNTER — Ambulatory Visit
Admission: RE | Admit: 2014-12-20 | Discharge: 2014-12-20 | Disposition: A | Discharge status: Home / Self Care | Payer: BLUE CROSS/BLUE SHIELD | Source: Ambulatory Visit | Attending: Orthopedic Surgery | Admitting: Orthopedic Surgery

## 2014-12-20 DIAGNOSIS — F319 Bipolar disorder, unspecified: Secondary | ICD-10-CM | POA: Insufficient documentation

## 2014-12-20 DIAGNOSIS — M25519 Pain in unspecified shoulder: Secondary | ICD-10-CM

## 2014-12-25 ENCOUNTER — Telehealth: Payer: Self-pay | Admitting: Obstetrics & Gynecology

## 2014-12-25 ENCOUNTER — Telehealth: Payer: Self-pay

## 2014-12-25 NOTE — Telephone Encounter (Signed)
Left message to call [place] at [phone].  Notes Recorded by Megan Salon, MD on 12/25/2014 at 3:19 AM Please inform pt her [facility] is REALLY in menopausal range. Has increased from 63 to 124. Good news is symptoms won't get any worse in relation to menopause. Estrogen is low. Testerone value is appropriate. She does not want to be on HRT. I think she should follow up with provider in Dr. Arvil Persons office just to be thorough about psychological care at this time. Plan follow up 3 months with me. Thanks.

## 2014-12-25 NOTE — Telephone Encounter (Signed)
Patient calling for recent results and to to give an update.

## 2014-12-25 NOTE — Telephone Encounter (Signed)
Spoke with patient at time of incoming call. Patient requesting refills of Zoloft 50 mg tablets. States she still has two weeks left but wanted to make sure there is enough time for refill to be sent in. Advised will send a message over to [place] regarding refill request. Patient is agreeable. Aware Dr.Miller is out of the office until 12/27/2014.

## 2014-12-25 NOTE — Telephone Encounter (Signed)
Spoke with patient. Advised of results as seen below from [place]. Patient is agreeable and verbalizes understanding. Patient states she is doing well on Zoloft 50 mg daily. States last week she felt like she did not have "any emotions." Now she feels back to "normal."  Advised patient if she feels this way again to give our office a call to let Dr.Miller know as dosage may need to be adjusted. Patient is agreeable. 3 month follow up scheduled for 03/26/2015 at 2:30pm with Dr.Miller. Agreeable to date and time.  Routing to provider for final review. Patient agreeable to disposition. Will close encounter.   Patient aware provider will review message and nurse will return call if any additional advice or change of disposition.

## 2014-12-26 NOTE — Telephone Encounter (Signed)
Routing to [place] for review of refills upon return.

## 2014-12-26 NOTE — Telephone Encounter (Signed)
Patent is calling regarding her Zoloft prescription. Patient has decided on 25 mg. No need to call unless you have a question.

## 2014-12-27 ENCOUNTER — Telehealth: Payer: Self-pay | Admitting: Obstetrics & Gynecology

## 2014-12-27 MED ORDER — SERTRALINE HCL 25 MG PO TABS: 50.0000 mg | ORAL_TABLET | Freq: Every day | ORAL | Status: DC

## 2014-12-27 NOTE — Telephone Encounter (Signed)
[place] at [facility] is calling regarding a prescription sent in today. [place] states the insurance will not cover the Sertaline at 56m.

## 2014-12-27 NOTE — Telephone Encounter (Signed)
Rx done for 76m and RFs.  Encounter closed.

## 2014-12-27 NOTE — Telephone Encounter (Signed)
Spoke with Jill Mcintyre at [facility]. Rx changed to Zoloft 76m tablets take one per day #30 12RF for insurance coverage. Spoke with patient. Patient is agreeable and will cut 558mtablet in half daily.  Routing to provider for final review. Patient agreeable to disposition. Will close encounter.   Patient aware provider will review message and nurse will return call if any additional advice or change of disposition.

## 2015-02-11 ENCOUNTER — Other Ambulatory Visit: Payer: Self-pay | Admitting: Obstetrics & Gynecology

## 2015-02-11 MED ORDER — SERTRALINE HCL 100 MG PO TABS: 100.0000 mg | ORAL_TABLET | Freq: Every day | ORAL | Status: DC

## 2015-02-11 NOTE — Telephone Encounter (Signed)
Medication refill request: Zoloft  Last AEX:  10/16/13 SM Next AEX: ? Last MMG (if hormonal medication request): 2012 [id number]:neg Refill authorized: 12/27/14 #30tabs/ 12R. Please advise.    Patient she is taking 50 mg daily and would like to take 100 mg daily.

## 2015-02-11 NOTE — Telephone Encounter (Signed)
Patient is requesting a refill of Zoloft. Patient would like to increase the dosage. Patient says Dr.Miller told her it would be okay to double the Zoloft dosage. Confirmed the pharmacy.

## 2015-03-04 ENCOUNTER — Other Ambulatory Visit: Payer: Self-pay | Admitting: Certified Nurse Midwife

## 2015-03-05 NOTE — Telephone Encounter (Signed)
Medication refill request: Macrobid Last AEX:  10/26/14 with SM Next AEX: 03/27/15 with SM Last MMG (if hormonal medication request):  Refill authorized: please advise

## 2015-03-26 ENCOUNTER — Ambulatory Visit: Payer: BLUE CROSS/BLUE SHIELD | Admitting: Obstetrics & Gynecology

## 2015-03-27 ENCOUNTER — Ambulatory Visit (INDEPENDENT_AMBULATORY_CARE_PROVIDER_SITE_OTHER): Payer: BLUE CROSS/BLUE SHIELD | Admitting: Obstetrics & Gynecology

## 2015-03-27 ENCOUNTER — Encounter: Payer: Self-pay | Admitting: Obstetrics & Gynecology

## 2015-03-27 VITALS — BP 98/60 | HR 64 | Resp 16 | Wt 135.0 lb

## 2015-03-27 DIAGNOSIS — R319 Hematuria, unspecified: Secondary | ICD-10-CM

## 2015-03-27 DIAGNOSIS — R6882 Decreased libido: Secondary | ICD-10-CM | POA: Diagnosis not present

## 2015-03-27 DIAGNOSIS — D500 Iron deficiency anemia secondary to blood loss (chronic): Secondary | ICD-10-CM

## 2015-03-27 DIAGNOSIS — N950 Postmenopausal bleeding: Secondary | ICD-10-CM | POA: Diagnosis not present

## 2015-03-27 DIAGNOSIS — Z01419 Encounter for gynecological examination (general) (routine) without abnormal findings: Secondary | ICD-10-CM | POA: Diagnosis not present

## 2015-03-27 DIAGNOSIS — N898 Other specified noninflammatory disorders of vagina: Secondary | ICD-10-CM | POA: Diagnosis not present

## 2015-03-27 DIAGNOSIS — Z124 Encounter for screening for malignant neoplasm of cervix: Secondary | ICD-10-CM

## 2015-03-27 LAB — CBC
HEMATOCRIT: 40 % (ref 36.0–46.0)
Hemoglobin: 13.4 g/dL (ref 12.0–15.0)
MCH: 29.8 pg (ref 26.0–34.0)
MCHC: 33.5 g/dL (ref 30.0–36.0)
MCV: 89.1 fL (ref 78.0–100.0)
MPV: 10.1 fL (ref 8.6–12.4)
Platelets: 237 10*3/uL (ref 150–400)
RBC: 4.49 MIL/uL (ref 3.87–5.11)
RDW: 13.2 % (ref 11.5–15.5)
WBC: 4.9 10*3/uL (ref 4.0–10.5)

## 2015-03-27 LAB — POCT URINALYSIS DIPSTICK
Bilirubin, UA: NEGATIVE
Glucose, UA: NEGATIVE
KETONES UA: NEGATIVE
Nitrite, UA: NEGATIVE
UROBILINOGEN UA: NEGATIVE
pH, UA: 5

## 2015-03-27 LAB — FERRITIN: Ferritin: 25 ng/mL (ref 10–291)

## 2015-03-27 LAB — IRON: Iron: 67 ug/dL (ref 45–160)

## 2015-03-27 MED ORDER — NITROFURANTOIN MONOHYD MACRO 100 MG PO CAPS: 100.0000 mg | ORAL_CAPSULE | ORAL | Status: DC | PRN

## 2015-03-27 MED ORDER — FLUCONAZOLE 150 MG PO TABS: ORAL_TABLET | ORAL | Status: DC

## 2015-03-27 MED ORDER — VALACYCLOVIR HCL 500 MG PO TABS: 500.0000 mg | ORAL_TABLET | Freq: Every day | ORAL | Status: DC

## 2015-03-27 NOTE — Progress Notes (Addendum)
Patient ID: Jill Mcintyre, female   DOB: [birth year], 54 y.o.   MRN: [id number]  54 y.o. G2P2002 Married WF here for annual exam.  Pt reports having some spotting this week.  Has been spotting for three days.  [facility] June 124 and in April was 64.    Going to a wedding in the [place] next [place].  Has rx for UTI, in case, but needs rx for Diflucan as well.    Denies hot flashes but states libido is definitely low and she feels increased dryness.  Pt is not on HRT  Has PUS in 4/16.    Pt reports urine has been cloudy with some increased odor for three or four days.  Also having vaginal discharge.  Used OTC product this weekend.  Having some continued discharge.  No new sexual partner.  No vaginal odor.  Patient's last menstrual period was 09/04/2014.          Sexually active: Yes.    The current method of family planning is vasectomy.    Exercising: Yes.    Home exercise routine includes running, weights, and doing PT on her back.. Smoker:  Former smoker years ago  Health Maintenance: Pap:  2015 with neg HR HPV History of abnormal Pap:  Yes, ASCUS 2013 MMG:  10/27/10 Colonoscopy:  04/20/14, Dr. Collene Mares.  Follow-up 5 years. BMD:   2007 TDaP:  4/15 Screening Labs: with [name], Hb today: CBC penidng, Urine today: WBC, protein, +3RBCs (bleeding today)   reports that she has quit smoking. She started smoking about 32 years ago. She has never used smokeless tobacco. She reports that she drinks alcohol. She reports that she does not use illicit drugs.    Past Surgical History  Procedure Laterality Date  . Esophagogastroduodenoscopy  09/07/2006    Dr Ardis Hughs  . Breast enhancement surgery    . Basal cell carcinoma excision      Dr Amy Martinique  . Colposcopy  10/07/11    CIN I  . Cesarean section  1996    Current Outpatient Prescriptions  Medication Sig Dispense Refill  . ALPRAZolam (XANAX) 0.25 MG tablet     . Biotin (BIOTIN 5000) 5 MG CAPS Take 1,000 mg by mouth daily.     Marland Kitchen  FLUARIX QUADRIVALENT 0.5 ML injection FLU SHOT  0  . lamoTRIgine (LAMICTAL) 100 MG tablet Take 100 mg by mouth daily.     . Multiple Vitamin (MULTI-VITAMIN PO) Take by mouth daily.    . nitrofurantoin, macrocrystal-monohydrate, (MACROBID) 100 MG capsule TAKE ONE CAPSULE BY MOUTH POST COITAL,IF SYMPTOMS PERSIST NEED TO CALL 30 capsule 2  . Nutritional Supplements (ESTROVEN PO) Take by mouth daily.    . sertraline (ZOLOFT) 100 MG tablet Take 1 tablet (100 mg total) by mouth daily. 30 tablet 2  . spironolactone (ALDACTONE) 25 MG tablet     . temazepam (RESTORIL) 15 MG capsule TAKE ONE CAPSULE BY MOUTH NIGHTLY AT BEDTIME for sleep 30 capsule 1  . traMADol (ULTRAM) 50 MG tablet TAKE ONE-HALF TABLET BY MOUTH EVERY EIGHT HOURS AS NEEDED 60 tablet 2  . valACYclovir (VALTREX) 500 MG tablet Take 1 tablet (500 mg total) by mouth daily. 90 tablet 3  . fluconazole (DIFLUCAN) 150 MG tablet TAKE ONE TABLET BY MOUTH ONCE, IF NEEDED  0  . furosemide (LASIX) 20 MG tablet Take 1 tablet (20 mg total) by mouth daily as needed. 30 tablet 5   No current facility-administered medications for this visit.  Family History  Problem Relation Age of Onset  . Alcohol abuse    . Arthritis    . Cancer      breast, ovrarian, uterine  . Cancer Mother     ROS:  Pertinent items are noted in HPI.  Otherwise, a comprehensive ROS was negative.  Exam:   BP 98/60 mmHg  Pulse 64  Resp 16  Wt 135 lb (61.236 kg)  LMP 09/04/2014  Weight change: -5#      Ht Readings from Last 3 Encounters:  12/18/14 5' 8.25" (1.734 m)  12/04/14 5' 8.25" (1.734 m)  10/17/14 5' 8.25" (1.734 m)    General appearance: alert, cooperative and appears stated age Head: Normocephalic, without obvious abnormality, atraumatic Neck: no adenopathy, supple, symmetrical, trachea midline and thyroid normal to inspection and palpation Lungs: clear to auscultation bilaterally Breasts: normal appearance, no masses or tenderness, bilateral implants  present Heart: regular rate and rhythm Abdomen: soft, non-tender; bowel sounds normal; no masses,  no organomegaly Extremities: extremities normal, atraumatic, no cyanosis or edema Skin: Skin color, texture, turgor normal. No rashes or lesions Lymph nodes: Cervical, supraclavicular, and axillary nodes normal. No abnormal inguinal nodes palpated Neurologic: Grossly normal   Pelvic: External genitalia:  no lesions              Urethra:  normal appearing urethra with no masses, tenderness or lesions              Bartholins and Skenes: normal                 Vagina: normal appearing vagina with normal color and discharge, no lesions              Cervix: no lesions              Pap taken: Yes.   Bimanual Exam:  Uterus:  normal size, contour, position, consistency, mobility, non-tender              Adnexa: normal adnexa and no mass, fullness, tenderness               Rectovaginal: Confirms               Anus:  normal sphincter tone, no lesions  Chaperone was present for exam.  A:  Well Woman with normal exam PMP, no HRT Bipolar d/o. H/O CIN 1. H/O colposcopy with biopsies 4/13.  Neg pap with neg HR HPV 2015.   Anemia. On iron. PMP bleeding with PUS 4/16.  Dysuria with vaginal discharge Breast implants  P: Mammogram due. Pt aware.  States she will schedule.  Information provided.  Declines having [place] schedule.   Iron, CBC, ferritin today FSH today as well. Urine culture pending Affirm pending Testosterone level today.   Trial of topical testosterone 1% proprionate in petrolatum.  Apply 1/4 tsp twice weekly.  #60 grams/0RF.  May need to repeat level.  Pt aware to watch for mood changes, hair changes, or increased acne. Rx for Valtrex 549m daily.  #90/4RF Rx for Diflucan 1554mpo x 1 repeat 48 hrs.  #2/1RF Rx for Macrobid 10039most coitally.  #30/3RF Pt is going to decrease Zoloft to 3m66mily.  She will cut these in 1/2 and let me know when she needs RFs. return  annually or prn  In addition to AEX, additional 15 minutes spent discussing PMP bleeding, urine symptoms, vaginal discharge, and upcoming [place]

## 2015-03-28 ENCOUNTER — Other Ambulatory Visit: Payer: Self-pay

## 2015-03-28 ENCOUNTER — Telehealth: Payer: Self-pay

## 2015-03-28 DIAGNOSIS — Z1231 Encounter for screening mammogram for malignant neoplasm of breast: Secondary | ICD-10-CM

## 2015-03-28 DIAGNOSIS — N950 Postmenopausal bleeding: Secondary | ICD-10-CM

## 2015-03-28 LAB — FOLLICLE STIMULATING HORMONE: FSH: 70.9 m[IU]/mL

## 2015-03-28 LAB — WET PREP BY MOLECULAR PROBE
Candida species: NEGATIVE
GARDNERELLA VAGINALIS: NEGATIVE
TRICHOMONAS VAG: NEGATIVE

## 2015-03-28 LAB — TESTOSTERONE: Testosterone: 19 ng/dL (ref 10–70)

## 2015-03-28 NOTE — Telephone Encounter (Signed)
-----   Message from Megan Salon, MD sent at 03/28/2015  2:44 PM EDT ----- Inform affirm negative.  FSH is 70.  She really should have not bleeding.  She needs to return for PUS and possible endometrial biopsy.  Testosterone is low so starting topical testosterone is fine.  Needs repeat in 3 months if stays on it.  She knows it may increase acne so this may end up being a reason she doesn't use it.  Ferritin is better at 25.  Iron is good at 67.  Hemoglobin is normal at 13.4.  WBC and plt ct normal.  Urine culture is still pending.

## 2015-03-28 NOTE — Telephone Encounter (Signed)
Lmtcb//kn 

## 2015-03-28 NOTE — Telephone Encounter (Signed)
Patient notified of all results. Will start testosterone, 3 month lab recheck has been scheduled-see result note.//kn

## 2015-03-28 NOTE — Telephone Encounter (Signed)
Spoke with patient. She is scheduled for Pelvic ultrasound and Endometrial biopsy with Dr. Sabra Heck for 04/18/15 at 1230 with consult at 1300. Patient chose date of 10/27 as will be out of the country from 10/13 to 10/23. Advised she will be contacted with benefits information. cc Kerry Hough for insurance pre-certification and patient contact.

## 2015-03-29 LAB — IPS PAP TEST WITH HPV

## 2015-03-29 LAB — URINE CULTURE
Colony Count: NO GROWTH
Organism ID, Bacteria: NO GROWTH

## 2015-04-02 ENCOUNTER — Ambulatory Visit
Admission: RE | Admit: 2015-04-02 | Discharge: 2015-04-02 | Disposition: A | Discharge status: Home / Self Care | Payer: BLUE CROSS/BLUE SHIELD | Source: Ambulatory Visit

## 2015-04-02 DIAGNOSIS — Z1231 Encounter for screening mammogram for malignant neoplasm of breast: Secondary | ICD-10-CM

## 2015-04-08 ENCOUNTER — Telehealth: Payer: Self-pay | Admitting: Obstetrics & Gynecology

## 2015-04-08 NOTE — Telephone Encounter (Signed)
Per patient request called to leave message with benefit details. Voicemail full. Unable to leave message.

## 2015-04-08 NOTE — Telephone Encounter (Signed)
Patient called back to talk with [facility]. She is in [place] She states it is ok to leave a detailed message.

## 2015-04-08 NOTE — Telephone Encounter (Signed)
Called patient to review benefits for procedure. Left voicemail to call back and review. °

## 2015-04-18 ENCOUNTER — Ambulatory Visit (INDEPENDENT_AMBULATORY_CARE_PROVIDER_SITE_OTHER): Payer: BLUE CROSS/BLUE SHIELD | Admitting: Obstetrics & Gynecology

## 2015-04-18 ENCOUNTER — Ambulatory Visit (INDEPENDENT_AMBULATORY_CARE_PROVIDER_SITE_OTHER): Payer: BLUE CROSS/BLUE SHIELD

## 2015-04-18 DIAGNOSIS — N950 Postmenopausal bleeding: Secondary | ICD-10-CM | POA: Diagnosis not present

## 2015-04-18 NOTE — Progress Notes (Signed)
54 y.o.Divorced, now remarried, G68P2 Caucasian female here for a pelvic ultrasound due to postmenopausal bleeding.  Pt's [place] value has jumped around some.  Was 2 in April, then 124 in Jun, and then 70 at [place] in early October.  At that appointment, she was spotting.  She feels "menopausal" with hot flashes and change in libido as well a shaving some mild vaginal dryness.  PUS recommended to assess endometrium.    Patient's last menstrual period was 09/04/2014.  Sexually active:  yes  Contraception: vasectomy  FINDINGS: UTERUS:  8.4 x 4.4 x 3.7cm without fibroids EMS: 2.25m at thickest measurement ADNEXA:   Left ovary 1.3 x 0.6 x 1.0cm   Right ovary 1.5 x 0.8 x 0.8cm, no masses seen CUL DE SAC: no free fluid  Findings reviewed with pt.  As her endometrium is thin, feel can monitor.  Bleeding at AEX was uterine bleeding as I visualized it personally.  Pt does have hx of ASCUS pap with CIN 1 on biopsy but has now had two subsequent negative Pap smears with neg HR HPV testing.  Pt aware if she has any bleeding between now and next AEX, she is to call and endometrial biopsy will be planned at that time.  Pt voices understanding and is in agreement with plan.  Assessment:  PMP bleeding, normal PUS, FSH 70 at AEX 03/27/15  Plan: Endometrial biopsy will be done if pt has any future bleeding.    ~15 minutes spent with patient >50% of time was in face to face discussion of above.

## 2015-04-22 NOTE — Telephone Encounter (Signed)
Patient took difucan and she now has a white discharge that looks like cottage cheese. She wants to talk with the nurse to figure out what to do next because it has not cleared up yet.

## 2015-04-22 NOTE — Telephone Encounter (Signed)
Call to patient and message from Dr. Sabra Heck given. Patient agreeable.  She will use Terazol PV for 7 nights and return call if any additional symptoms for office visit with Dr. Sabra Heck.  Will close encounter.

## 2015-04-22 NOTE — Telephone Encounter (Signed)
OK to use the Terazol cream but if not resolved after that, should have follow up OV.

## 2015-04-22 NOTE — Telephone Encounter (Signed)
Spoke with patient. She states she took Macrobid while on vacation and returned on 04/15/15 with vaginal itching and white vaginal discharge. She took Diflucan 150 on 04/15/15 and repeated another diflucan on 04/18/15 after office visit with Dr. Sabra Heck. She states she thought symptoms were improving but after intercourse today she noticed very thick white discharge and increased vaginal itching today. Offered office visit for patient as she has treated with diflucan without improvement of symptoms. Patient states she has Terazol Cream as well. Wondering if she should use either. Declines office visit at this time, would like to know from Dr. Sabra Heck what she suggests at this point. If Dr. Sabra Heck recommends office visit she is agreeable to schedule.

## 2015-04-24 ENCOUNTER — Encounter: Payer: Self-pay | Admitting: Obstetrics & Gynecology

## 2015-04-26 ENCOUNTER — Ambulatory Visit (INDEPENDENT_AMBULATORY_CARE_PROVIDER_SITE_OTHER): Payer: BLUE CROSS/BLUE SHIELD | Admitting: Nurse Practitioner

## 2015-04-26 ENCOUNTER — Encounter: Payer: Self-pay | Admitting: Nurse Practitioner

## 2015-04-26 ENCOUNTER — Telehealth: Payer: Self-pay | Admitting: Obstetrics & Gynecology

## 2015-04-26 VITALS — BP 138/88 | HR 72 | Temp 97.7°F | Ht 68.25 in | Wt 134.0 lb

## 2015-04-26 DIAGNOSIS — N390 Urinary tract infection, site not specified: Secondary | ICD-10-CM | POA: Diagnosis not present

## 2015-04-26 DIAGNOSIS — R300 Dysuria: Secondary | ICD-10-CM

## 2015-04-26 LAB — POCT URINALYSIS DIPSTICK
BILIRUBIN UA: NEGATIVE
Glucose, UA: NEGATIVE
KETONES UA: NEGATIVE
Nitrite, UA: NEGATIVE
Protein, UA: NEGATIVE
Urobilinogen, UA: NEGATIVE
pH, UA: 7

## 2015-04-26 NOTE — Telephone Encounter (Signed)
Patient was treated recently for a yeast infection and now her urine smells "funky". Patient says the yeast infection has gone away just wondering about the urine. Last seen 04/18/15.

## 2015-04-26 NOTE — Patient Instructions (Signed)

## 2015-04-26 NOTE — Telephone Encounter (Signed)
Call to patient. She c/o of two days of urine odor and today began to have dysuria. No fevers, chills or flank pain. Office visit with Kem Boroughs, FNP today scheduled at 1500. Patient declines earlier appointment today.   Routing to provider for final review. Patient agreeable to disposition. Will close encounter.    Cc Dr. Sabra Heck.

## 2015-04-26 NOTE — Progress Notes (Signed)
54 y.o.Divorced Caucasian female G2P2002 here with complaint of UTI, with onset 2 days ago. Patient complaining of:  dysuria, urinary frequency, urinary urgency and foul urine odor. Patient denies fever, chills, nausea or back pain. No new personal products. Patient feels symptoms are related to sexual activity. She does have vaginal symptoms.    Contraception is postmenopause.   No change in partner.  Menopausal with vaginal dryness. Patient does have adequate water intake.  She has been on Macrobid for a month as a prevention for UTI and completed this about 04/14/15.  Husband  has an apt. with Urologist end of month.   O: Healthy female WDWN Affect: Normal, orientation x 3 Skin : warm and dry CVAT: negative bilateral Abdomen: negative for suprapubic tenderness  Pelvic exam: External genital area: normal, no lesions Bladder,Urethra tender, Urethral meatus: normal Vagina: thin white vaginal discharge Cervix: normal, non tender Uterus:normal,non tender Adnexa: normal non tender, no fullness or masses  POCT:  Cloudy, trace RBC and small leuk's Affirm is taken  A:  UTI  Normal pelvic exam  P: Reviewed findings of UTI and need for treatment. Rx:  She has Macrobid on hand and will restart BID  She has allergy to Cipro and Sulfa [id number]:[id number] micro, culture and Affirm Reviewed warning signs and symptoms of UTI and need to advise if occurring. Encouraged to limit soda, tea, and coffee   RV prn

## 2015-04-27 ENCOUNTER — Other Ambulatory Visit: Payer: Self-pay | Admitting: Obstetrics & Gynecology

## 2015-04-27 LAB — URINALYSIS, MICROSCOPIC ONLY
CASTS: NONE SEEN [LPF]
Crystals: NONE SEEN [HPF]
RBC / HPF: NONE SEEN RBC/HPF (ref <=2)
Yeast: NONE SEEN [HPF]

## 2015-04-27 LAB — WET PREP BY MOLECULAR PROBE
Candida species: NEGATIVE
GARDNERELLA VAGINALIS: POSITIVE — AB
Trichomonas vaginosis: NEGATIVE

## 2015-04-29 ENCOUNTER — Other Ambulatory Visit: Payer: Self-pay | Admitting: Nurse Practitioner

## 2015-04-29 LAB — URINE CULTURE: Colony Count: >=100000

## 2015-04-29 MED ORDER — METRONIDAZOLE 0.75 % VA GEL
1.0000 | Freq: Every day | VAGINAL | Status: DC
Start: 2015-04-29 — End: 2015-10-02

## 2015-04-29 NOTE — Addendum Note (Signed)
Addended by: Abelino Derrick C on: 04/29/2015 05:07 PM   Modules accepted: Orders, SmartSet

## 2015-04-29 NOTE — Progress Notes (Signed)
Encounter reviewed by Dr. Violette Morneault Amundson C. Silva.  

## 2015-04-29 NOTE — Telephone Encounter (Signed)
Medication refill request: temazepam 33m Last AEX:  03-27-15 Next AEX: not scheduled yet Last MMG (if hormonal medication request): 04-03-15 category b density,birads 1:neg Refill authorized: rx last given 12-13-14 #30 with 1 refill. Please approve rx with number of refills if you feel appropriate.

## 2015-05-09 ENCOUNTER — Other Ambulatory Visit: Payer: Self-pay

## 2015-05-09 ENCOUNTER — Ambulatory Visit (INDEPENDENT_AMBULATORY_CARE_PROVIDER_SITE_OTHER): Payer: BLUE CROSS/BLUE SHIELD

## 2015-05-09 VITALS — BP 118/74 | HR 76 | Resp 16 | Ht 68.25 in | Wt 138.0 lb

## 2015-05-09 DIAGNOSIS — N390 Urinary tract infection, site not specified: Secondary | ICD-10-CM | POA: Diagnosis not present

## 2015-05-09 NOTE — Addendum Note (Signed)
Addended by: Graylon Good on: 05/09/2015 09:49 AM   Modules accepted: Orders

## 2015-05-09 NOTE — Progress Notes (Signed)
Pt here today for Test of cure. She states she has finished her antibiotic and is not experiencing any more symptoms. Pt was informed that we would send urine for culture and call her back with results.

## 2015-05-09 NOTE — Addendum Note (Signed)
Addended by: Graylon Good on: 05/09/2015 09:45 AM   Modules accepted: Orders, SmartSet

## 2015-05-10 LAB — URINE CULTURE

## 2015-05-12 ENCOUNTER — Other Ambulatory Visit: Payer: Self-pay | Admitting: Obstetrics & Gynecology

## 2015-05-13 NOTE — Telephone Encounter (Signed)
Medication refill request: Sertraline Last AEX:  03/27/2015 Next AEX: 07/02/2015 Last MMG (if hormonal medication request): 04/02/2015 Digital with implants bilateral, BI-RADS Category 1, Negative Refill authorized: 02/11/2015 #30 tablets and 2 Refills. Please Advise

## 2015-07-02 ENCOUNTER — Other Ambulatory Visit: Payer: Self-pay | Admitting: Obstetrics & Gynecology

## 2015-07-02 ENCOUNTER — Other Ambulatory Visit (INDEPENDENT_AMBULATORY_CARE_PROVIDER_SITE_OTHER): Payer: BLUE CROSS/BLUE SHIELD

## 2015-07-02 DIAGNOSIS — N950 Postmenopausal bleeding: Secondary | ICD-10-CM

## 2015-07-02 DIAGNOSIS — R6882 Decreased libido: Secondary | ICD-10-CM

## 2015-07-03 LAB — TESTOSTERONE: Testosterone: 36 ng/dL

## 2015-07-30 ENCOUNTER — Other Ambulatory Visit: Payer: Self-pay | Admitting: Obstetrics & Gynecology

## 2015-07-30 DIAGNOSIS — G4700 Insomnia, unspecified: Secondary | ICD-10-CM

## 2015-07-30 NOTE — Telephone Encounter (Signed)
I called patient back in regards to her refill request question. She said that since menopause started she is having more and more problems sleeping. Shes only sleeping about 3 hours a night. She was wondering if she should maybe be on something else or increase her current dose of temazepam. Please Advise

## 2015-07-30 NOTE — Telephone Encounter (Signed)
Patient has questions regarding her temazepam refill request.

## 2015-07-30 NOTE — Telephone Encounter (Signed)
Medication refill request: Restoril  Last AEX:  03-27-15 Next AEX: not yet schduled  Last MMG (if hormonal medication request): 04-02-15 WNL  Refill authorized: please advise

## 2015-08-05 NOTE — Telephone Encounter (Signed)
If she's having that much trouble, should probably see a neurologist before adjusting medication.  Referral placed.

## 2015-08-05 NOTE — Addendum Note (Signed)
Addended by: Megan Salon on: 08/05/2015 05:39 AM   Modules accepted: Orders

## 2015-08-28 ENCOUNTER — Telehealth: Payer: Self-pay | Admitting: Obstetrics & Gynecology

## 2015-08-28 NOTE — Telephone Encounter (Signed)
Patient says she need a written letter from Dr Sabra Heck faxed to her dermatologist Dr Allyn Kenner stating that she is in menopause and no longer able to get pregnant so she can get back on accutane. Dr. Juel Burrow office number is [phone] fax number is [phone] attention [place].

## 2015-08-28 NOTE — Telephone Encounter (Signed)
Routing to Dr. Sabra Heck to review and advise regarding letter per patient request.

## 2015-08-29 ENCOUNTER — Encounter: Payer: Self-pay | Admitting: Obstetrics & Gynecology

## 2015-08-29 NOTE — Telephone Encounter (Signed)
Letter written and send through [employer].  Copy printed and signed to be faxed as well.  Thanks.

## 2015-08-29 NOTE — Telephone Encounter (Signed)
Message left to advise that I have sent request to Dr. Sabra Heck and will return her call with message from Dr. Sabra Heck.  Okay for detailed message per designated party release form.

## 2015-08-29 NOTE — Telephone Encounter (Signed)
Patient is calling to check on the status of the written letter she requested.

## 2015-08-30 ENCOUNTER — Other Ambulatory Visit: Payer: Self-pay | Admitting: Family Medicine

## 2015-08-30 ENCOUNTER — Telehealth: Payer: Self-pay | Admitting: Family Medicine

## 2015-08-30 DIAGNOSIS — L700 Acne vulgaris: Secondary | ICD-10-CM

## 2015-08-30 NOTE — Telephone Encounter (Signed)
Okay to schedule

## 2015-08-30 NOTE — Telephone Encounter (Signed)
Letter faxed to [place] office, attention [place] at [phone] with cover sheet and confirmation. Spoke with patient. Advised letter has been written by Dr.Miller and faxed to [place] office attention to [facility]. She is agreeable and is very grateful.  Routing to provider for final review. Patient agreeable to disposition. Will close encounter.

## 2015-08-30 NOTE — Telephone Encounter (Signed)
Pt has rx for liver panell from her dermatologist. Pt will be starting on Accutane. Can I sch?

## 2015-08-30 NOTE — Telephone Encounter (Signed)
Unable to leave message mailbox full.

## 2015-08-30 NOTE — Telephone Encounter (Signed)
Can you call pt to schedule lab draw? The order is in computer.

## 2015-09-02 ENCOUNTER — Other Ambulatory Visit (INDEPENDENT_AMBULATORY_CARE_PROVIDER_SITE_OTHER): Payer: BLUE CROSS/BLUE SHIELD

## 2015-09-02 DIAGNOSIS — L700 Acne vulgaris: Secondary | ICD-10-CM | POA: Diagnosis not present

## 2015-09-02 NOTE — Telephone Encounter (Signed)
Pt has been sch

## 2015-09-03 LAB — COMPREHENSIVE METABOLIC PANEL
ALBUMIN: 4.5 g/dL (ref 3.5–5.2)
ALT: 14 U/L (ref 0–35)
AST: 17 U/L (ref 0–37)
Alkaline Phosphatase: 39 U/L (ref 39–117)
BUN: 17 mg/dL (ref 6–23)
CHLORIDE: 103 meq/L (ref 96–112)
CO2: 30 meq/L (ref 19–32)
CREATININE: 0.78 mg/dL (ref 0.40–1.20)
Calcium: 9.6 mg/dL (ref 8.4–10.5)
GFR: 82.18 mL/min (ref >60.00)
GLUCOSE: 104 mg/dL — AB (ref 70–99)
POTASSIUM: 4.2 meq/L (ref 3.5–5.1)
SODIUM: 141 meq/L (ref 135–145)
Total Bilirubin: 0.4 mg/dL (ref 0.2–1.2)
Total Protein: 6.9 g/dL (ref 6.0–8.3)

## 2015-09-03 LAB — CBC WITH DIFFERENTIAL/PLATELET
BASOS ABS: 0 10*3/uL (ref 0.0–0.1)
Basophils Relative: 0.7 % (ref 0.0–3.0)
EOS ABS: 0.3 10*3/uL (ref 0.0–0.7)
Eosinophils Relative: 5 % (ref 0.0–5.0)
HEMATOCRIT: 40.3 % (ref 36.0–46.0)
HEMOGLOBIN: 13.4 g/dL (ref 12.0–15.0)
Lymphocytes Relative: 34.1 % (ref 12.0–46.0)
Lymphs Abs: 1.8 10*3/uL (ref 0.7–4.0)
MCHC: 33.2 g/dL (ref 30.0–36.0)
MCV: 90.5 fl (ref 78.0–100.0)
Monocytes Absolute: 0.3 10*3/uL (ref 0.1–1.0)
Monocytes Relative: 6.2 % (ref 3.0–12.0)
NEUTROS ABS: 2.8 10*3/uL (ref 1.4–7.7)
Neutrophils Relative %: 54 % (ref 43.0–77.0)
PLATELETS: 230 10*3/uL (ref 150.0–400.0)
RBC: 4.46 Mil/uL (ref 3.87–5.11)
RDW: 13.6 % (ref 11.5–15.5)
WBC: 5.3 10*3/uL (ref 4.0–10.5)

## 2015-09-03 LAB — HEPATIC FUNCTION PANEL
ALBUMIN: 4.5 g/dL (ref 3.5–5.2)
ALT: 14 U/L (ref 0–35)
AST: 16 U/L (ref 0–37)
Alkaline Phosphatase: 39 U/L (ref 39–117)
Bilirubin, Direct: 0.1 mg/dL (ref 0.0–0.3)
Total Bilirubin: 0.4 mg/dL (ref 0.2–1.2)
Total Protein: 7 g/dL (ref 6.0–8.3)

## 2015-09-03 LAB — LIPID PANEL
CHOL/HDL RATIO: 2
Cholesterol: 178 mg/dL (ref 0–200)
HDL: 72.1 mg/dL (ref >39.00)
LDL CALC: 92 mg/dL (ref 0–99)
NONHDL: 105.79
Triglycerides: 68 mg/dL (ref 0.0–149.0)
VLDL: 13.6 mg/dL (ref 0.0–40.0)

## 2015-09-03 LAB — TRIGLYCERIDES: TRIGLYCERIDES: 68 mg/dL (ref 0.0–149.0)

## 2015-09-10 ENCOUNTER — Telehealth: Payer: Self-pay | Admitting: Family Medicine

## 2015-09-10 NOTE — Telephone Encounter (Signed)
Pt was to have lab results faxed to her dermatologist after her 3/13 appointment. However, Dr  Jenny Reichmann hall has not received Can you please fax results to [phone]

## 2015-09-10 NOTE — Telephone Encounter (Signed)
Faxed and confirmed

## 2015-09-16 ENCOUNTER — Other Ambulatory Visit: Payer: Self-pay | Admitting: Obstetrics & Gynecology

## 2015-09-17 ENCOUNTER — Other Ambulatory Visit: Payer: Self-pay | Admitting: Family Medicine

## 2015-09-17 NOTE — Telephone Encounter (Signed)
Patient is requesting a refill for Temazepam. She wants to know if she can have 60 pills because she is having to take 2 pills at a time on some nights. Or can the dosage be changed to 30 mg? Patient is going out of town for emergency surgery for her mother and would like to get this filled as soon as possible.

## 2015-09-17 NOTE — Telephone Encounter (Signed)
Medication refill request: Temazepam  Last AEX:  03-27-15 Next AEX: not scheduled  Last MMG (if hormonal medication request): 04-03-15 WNL  Refill authorized: please advise

## 2015-09-17 NOTE — Telephone Encounter (Signed)
Rx faxed to pharmacy today

## 2015-09-19 NOTE — Telephone Encounter (Signed)
Call in #[phone] with 2 rf

## 2015-10-02 ENCOUNTER — Encounter: Payer: Self-pay | Admitting: Internal Medicine

## 2015-10-02 ENCOUNTER — Ambulatory Visit (INDEPENDENT_AMBULATORY_CARE_PROVIDER_SITE_OTHER): Payer: BLUE CROSS/BLUE SHIELD | Admitting: Internal Medicine

## 2015-10-02 VITALS — BP 130/84 | HR 67 | Temp 98.3°F | Wt 133.8 lb

## 2015-10-02 DIAGNOSIS — J069 Acute upper respiratory infection, unspecified: Secondary | ICD-10-CM

## 2015-10-02 DIAGNOSIS — J029 Acute pharyngitis, unspecified: Secondary | ICD-10-CM

## 2015-10-02 DIAGNOSIS — B9789 Other viral agents as the cause of diseases classified elsewhere: Secondary | ICD-10-CM

## 2015-10-02 MED ORDER — IBUPROFEN 800 MG PO TABS: 800.0000 mg | ORAL_TABLET | Freq: Three times a day (TID) | ORAL | Status: DC | PRN

## 2015-10-02 NOTE — Progress Notes (Signed)
Pre visit review using our clinic review tool, if applicable. No additional management support is needed unless otherwise documented below in the visit note. 

## 2015-10-02 NOTE — Progress Notes (Signed)
Chief Complaint  Patient presents with  . Cough    2days ago   . Sore Throat    since 4 days ago     HPI: Jill Mcintyre 55 y.o.  Patient Jill Mcintyre  comes in today for SDA for  new problem evaluation. PCP NA Onset  Saturday 4 days  And  Bad up 2 night s   And hard to cough   On accutane.   Not felt to be related   No fever with this  But  Achy at times    No sob hemoptyyisis but pain in night and hard to swallow . No choking on meds   Took one ibu with some help temporarily  ROS: See pertinent positives and negatives per HPI.  Past Medical History  Diagnosis Date  . Anxiety   . Bipolar depression ([place])     sees Dr Gala Murdoch at [facility] and sees Trey Paula for  therapy  . GERD (gastroesophageal reflux disease)   . Herpes simplex   . Constipation   . Anemia   . Acne     sees Dr. Jarome Matin for skin checks and acne  . IBS (irritable bowel syndrome)   . Bipolar 1 disorder ([place])   . Insomnia   . Migraines   . Abnormal Pap smear of cervix 2013    ASCUS    Family History  Problem Relation Age of Onset  . Alcohol abuse    . Arthritis    . Cancer      breast, ovrarian, uterine  . Cancer Mother     Social History   Social History  . Marital Status: Divorced    Spouse Name: N/A  . Number of Children: N/A  . Years of Education: N/A   Social History Main Topics  . Smoking status: Former Smoker    Start date: 10/07/1982  . Smokeless tobacco: Never Used  . Alcohol Use: 0.0 oz/week    0 Standard drinks or equivalent per week     Comment: occ  . Drug Use: No  . Sexual Activity:    Partners: Male    Birth Control/ Protection: Pill     Comment: partner vasectomy   Other Topics Concern  . None   Social History Narrative    Outpatient Prescriptions Prior to Visit  Medication Sig Dispense Refill  . ALPRAZolam (XANAX) 0.25 MG tablet     . Biotin (BIOTIN 5000) 5 MG CAPS Take 1,000 mg by mouth daily.     Marland Kitchen lamoTRIgine  (LAMICTAL) 100 MG tablet Take 100 mg by mouth daily.     . Multiple Vitamin (MULTI-VITAMIN PO) Take by mouth daily.    . nitrofurantoin, macrocrystal-monohydrate, (MACROBID) 100 MG capsule Take 1 capsule (100 mg total) by mouth as needed. 30 capsule 2  . Nutritional Supplements (ESTROVEN PO) Take by mouth daily.    . sertraline (ZOLOFT) 100 MG tablet TAKE 1 TABLET BY MOUTH DAILY. 30 tablet 10  . temazepam (RESTORIL) 15 MG capsule TAKE 1 TABLET AT BEDTIME FOR SLEEP 30 capsule 1  . traMADol (ULTRAM) 50 MG tablet TAKE 1/2 TABLET BY MOUTH EVERY 8 HOURS AS NEEDED 60 tablet 2  . valACYclovir (VALTREX) 500 MG tablet Take 1 tablet (500 mg total) by mouth daily. 90 tablet 4  . fluconazole (DIFLUCAN) 150 MG tablet TAKE ONE TABLET BY MOUTH ONCE.  REPEAT 48 HOURS. (Patient not taking: Reported on 04/26/2015) 2 tablet 1  . metroNIDAZOLE (METROGEL)  0.75 % vaginal gel Place 1 Applicatorful vaginally at bedtime. 70 g 0   No facility-administered medications prior to visit.     EXAM:  BP 130/84 mmHg  Pulse 67  Temp(Src) 98.3 F (36.8 C) (Oral)  Wt 133 lb 12.8 oz (60.691 kg)  SpO2 97%  LMP 09/04/2014 (Exact Date)  Body mass index is 20.18 kg/(m^2).  GENERAL: vitals reviewed and listed above, alert, oriented, appears well hydrated and in no acute distress nl speech and resp ocass  Cough  HEENT: atraumatic, conjunctiva  clear, no obvious abnormalities on inspection of external nose and ears tmx clear  OP : no lesion edema or exudate  Mild  redness no NECK: no obvious masses on inspection palpation  Tender anterior trachea  Not thyroid tenderness and no adenopathy  Nl speech no drooling  Or stridor  LUNGS: clear to auscultation bilaterally, no wheezes, rales or rhonchi, good air movement CV: HRRR, no clubbing cyanosis or  peripheral edema nl cap refill  y PSYCH: pleasant and cooperative, no obvious depression or anxiety  ASSESSMENT AND PLAN:  Discussed the following assessment and plan:  Sore throat  - acts like tracheitis but no alarm or bacterial sx thyroid not tender but at level location  Viral upper respiratory tract infection with cough prsumed viral as scenario  Disc  Alarm features to get help or return fever  Severity drooling stridor hemoptysis .  Declined hydrocodone cough med  Advice for sx relief given -Patient advised to return or notify health care team  if symptoms worsen ,persist or new concerns arise.  Patient Instructions  Strep screen in negative   Suspect a viral  Tracheitis  Sore throat    With the cough  That will run its course.   Warm liquids    Moisture to help. Ibuprofen for pain is ok .  Can try mucinex dm for cough suppression at night if helps . Expect improvement  In the  Next 3-5 days cough can last 1-2 weeks . Chest exam is normal today.  Fu if  persistent or progressive    Standley Brooking. Georgiana Spillane M.D.

## 2015-10-02 NOTE — Patient Instructions (Signed)
Strep screen in negative   Suspect a viral  Tracheitis  Sore throat    With the cough  That will run its course.   Warm liquids    Moisture to help. Ibuprofen for pain is ok .  Can try mucinex dm for cough suppression at night if helps . Expect improvement  In the  Next 3-5 days cough can last 1-2 weeks . Chest exam is normal today.  Fu if  persistent or progressive

## 2015-10-09 ENCOUNTER — Encounter: Payer: Self-pay | Admitting: Family Medicine

## 2015-10-09 ENCOUNTER — Ambulatory Visit (INDEPENDENT_AMBULATORY_CARE_PROVIDER_SITE_OTHER): Payer: BLUE CROSS/BLUE SHIELD | Admitting: Family Medicine

## 2015-10-09 VITALS — BP 104/68 | Temp 98.4°F | Ht 68.25 in | Wt 136.0 lb

## 2015-10-09 DIAGNOSIS — J069 Acute upper respiratory infection, unspecified: Secondary | ICD-10-CM

## 2015-10-09 DIAGNOSIS — G4700 Insomnia, unspecified: Secondary | ICD-10-CM | POA: Diagnosis not present

## 2015-10-09 MED ORDER — METHYLPREDNISOLONE ACETATE 80 MG/ML IJ SUSP
160.0000 mg | Freq: Once | INTRAMUSCULAR | Status: AC
  Administered 2015-10-09: 160 mg via INTRAMUSCULAR

## 2015-10-09 MED ORDER — ZOLPIDEM TARTRATE 10 MG PO TABS: 10.0000 mg | ORAL_TABLET | Freq: Every evening | ORAL | Status: DC | PRN

## 2015-10-09 NOTE — Progress Notes (Signed)
   Subjective:    Patient ID: Jill Mcintyre, female    DOB: Feb 06, 1961, 55 y.o.   MRN: [id number]  HPI Here for 10 days of a painful ST and a dry cough. No fever. No NVD. No rashes. She is using 800 mg of Ibuprofen several times a day. This gives her some relief for a few hours only. She was seen here on 10-02-15 and again in Urgent Care on 10-05-15. A rapid strep test was negative. She also asks about something to help her sleep. She has tried melatonin and Temazepam with poor results.    Review of Systems  Constitutional: Negative.   HENT: Positive for sore throat and trouble swallowing. Negative for congestion, ear pain, postnasal drip and sinus pressure.   Eyes: Negative.   Respiratory: Positive for cough.        Objective:   Physical Exam  Constitutional: She appears well-developed and well-nourished. No distress.  HENT:  Right Ear: External ear normal.  Left Ear: External ear normal.  Nose: Nose normal.  Mouth/Throat: No oropharyngeal exudate.  Posterior OP is slightly red without exudate   Eyes: Conjunctivae are normal.  Neck: Neck supple. No thyromegaly present.  Shotty AC nodes   Pulmonary/Chest: Effort normal and breath sounds normal.          Assessment & Plan:  Viral URI. Given a steroid shot. For the insomnia try Zolpidem.  Laurey Morale, MD

## 2015-10-09 NOTE — Addendum Note (Signed)
Addended by: Aggie Hacker A on: 10/09/2015 05:24 PM   Modules accepted: Orders

## 2015-10-09 NOTE — Progress Notes (Signed)
Pre visit review using our clinic review tool, if applicable. No additional management support is needed unless otherwise documented below in the visit note. 

## 2015-10-16 ENCOUNTER — Encounter: Payer: Self-pay | Admitting: Obstetrics and Gynecology

## 2015-10-16 ENCOUNTER — Telehealth: Payer: Self-pay | Admitting: *Deleted

## 2015-10-16 ENCOUNTER — Ambulatory Visit (INDEPENDENT_AMBULATORY_CARE_PROVIDER_SITE_OTHER): Payer: BLUE CROSS/BLUE SHIELD | Admitting: Obstetrics and Gynecology

## 2015-10-16 VITALS — BP 110/70 | HR 76 | Resp 16 | Wt 134.0 lb

## 2015-10-16 DIAGNOSIS — L293 Anogenital pruritus, unspecified: Secondary | ICD-10-CM | POA: Diagnosis not present

## 2015-10-16 DIAGNOSIS — R300 Dysuria: Secondary | ICD-10-CM

## 2015-10-16 LAB — POCT URINALYSIS DIPSTICK
BILIRUBIN UA: NEGATIVE
Glucose, UA: NEGATIVE
Ketones, UA: NEGATIVE
NITRITE UA: NEGATIVE
PH UA: 7
PROTEIN UA: NEGATIVE
UROBILINOGEN UA: NEGATIVE

## 2015-10-16 MED ORDER — BETAMETHASONE VALERATE 0.1 % EX OINT: TOPICAL_OINTMENT | CUTANEOUS | Status: DC

## 2015-10-16 MED ORDER — MOMETASONE FUROATE 0.1 % EX OINT: TOPICAL_OINTMENT | CUTANEOUS | Status: DC

## 2015-10-16 NOTE — Progress Notes (Signed)
Patient ID: Jill Mcintyre, female   DOB: 03/23/1961, 55 y.o.   MRN: [id number] GYNECOLOGY  VISIT   HPI: 55 y.o.   Divorced  Caucasian  female   G2P2002 with Patient's last menstrual period was 09/04/2014 (exact date).   here c/o vaginal itching, discharge and dysuria. Her symptoms started on Sunday, severe itching. This morning she had dysuria x 1, felt like maybe a UTI was coming on. No frequency or urgency to void. No pain when she voided here this morning. She c/o a mild white vaginal d/c. No odor. She used some kind of intravaginal gel last night, left over from 2016. No fevers, no flank pain.  GYNECOLOGIC HISTORY: Patient's last menstrual period was 09/04/2014 (exact date). Contraception:postmenopause Menopausal hormone therapy: none        OB History    Gravida Para Term Preterm AB TAB SAB Ectopic Multiple Living   2 2 2       2          Patient Active Problem List   Diagnosis Date Noted  . Bipolar disorder ([place]) 12/20/2014  . Celiac disease 10/07/2014  . OLECRANON BURSITIS 04/02/2009  . ADHD 12/20/2008  . URTICARIA 10/26/2008  . ANEMIA, IRON DEFICIENCY, MICROCYTIC 07/24/2008  . ANXIETY 03/29/2007  . DEPRESSION 03/29/2007  . ASTHMA 03/29/2007  . GERD 03/29/2007    Past Medical History  Diagnosis Date  . Anxiety   . Bipolar depression ([place])     se[facility] Dr [facility] and sees Trey Paula for  therapy  . GERD (gastroesophageal reflux disease)   . Herpes simplex   . Constipation   . Anemia   . Acne     sees Dr. Jarome Matin for skin checks and acne  . IBS (irritable bowel syndrome)   . Bipolar 1 disorder ([place])   . Insomnia   . Migraines   . Abnormal Pap smear of cervix 2013    ASCUS    Past Surgical History  Procedure Laterality Date  . Esophagogastroduodenoscopy  09/07/2006    Dr Ardis Hughs  . Breast enhancement surgery    . Basal cell carcinoma excision      Dr Amy Martinique  . Colposcopy  10/07/11    CIN I  . Cesarean section   1996    Current Outpatient Prescriptions  Medication Sig Dispense Refill  . ALPRAZolam (XANAX) 0.25 MG tablet     . Biotin (BIOTIN 5000) 5 MG CAPS Take 1,000 mg by mouth daily.     Marland Kitchen ibuprofen (ADVIL,MOTRIN) 800 MG tablet Take 1 tablet (800 mg total) by mouth every 8 (eight) hours as needed for moderate pain. 24 tablet 1  . lamoTRIgine (LAMICTAL) 100 MG tablet Take 100 mg by mouth daily.     . Multiple Vitamin (MULTI-VITAMIN PO) Take by mouth daily.    . Nutritional Supplements (ESTROVEN PO) Take by mouth daily.    . sertraline (ZOLOFT) 100 MG tablet TAKE 1 TABLET BY MOUTH DAILY. 30 tablet 10  . traMADol (ULTRAM) 50 MG tablet TAKE 1/2 TABLET BY MOUTH EVERY 8 HOURS AS NEEDED 60 tablet 2  . valACYclovir (VALTREX) 500 MG tablet Take 1 tablet (500 mg total) by mouth daily. 90 tablet 4  . zolpidem (AMBIEN) 10 MG tablet Take 1 tablet (10 mg total) by mouth at bedtime as needed for sleep. 30 tablet 0   No current facility-administered medications for this visit.     ALLERGIES: Ciprofloxacin and Sulfa antibiotics  Family History  Problem Relation Age of Onset  . Alcohol abuse    . Arthritis    . Cancer      breast, ovrarian, uterine  . Cancer Mother     Social History   Social History  . Marital Status: Divorced    Spouse Name: N/A  . Number of Children: N/A  . Years of Education: N/A   Occupational History  . Not on file.   Social History Main Topics  . Smoking status: Former Smoker    Start date: 10/07/1982  . Smokeless tobacco: Never Used  . Alcohol Use: 0.0 oz/week    0 Standard drinks or equivalent per week     Comment: occ  . Drug Use: No  . Sexual Activity:    Partners: Male    Birth Control/ Protection: Pill     Comment: partner vasectomy   Other Topics Concern  . Not on file   Social History Narrative    Review of Systems  Constitutional: Negative.   HENT: Negative.   Eyes: Negative.   Respiratory: Negative.   Cardiovascular: Negative.    Gastrointestinal: Negative.   Genitourinary: Positive for dysuria.       Vaginal itching and discharge   Musculoskeletal: Negative.   Skin: Negative.   Neurological: Negative.   Endo/Heme/Allergies: Negative.   Psychiatric/Behavioral: Negative.     PHYSICAL EXAMINATION:    BP 110/70 mmHg  Pulse 76  Resp 16  Wt 134 lb (60.782 kg)  LMP 09/04/2014 (Exact Date)    General appearance: alert, cooperative and appears stated age  Pelvic: External genitalia:  no lesions, erythematous, slightly swollen              Urethra:  normal appearing urethra with no masses, tenderness or lesions              Bartholins and Skenes: normal                 Vagina: normal appearing vagina with an increased white, thick vaginal d/c (may be the gel she used last night)              Cervix: no lesions               Chaperone was present for exam.  Wet prep: ? clue, no trich, + wbc KOH: no yeast PH: 4.5 (used gel last night)   ASSESSMENT Genital pruritus, suspect yeast, not seen on vaginal slides Dysuria, could be external, symptoms    PLAN Steroid ointment for pruritus Send wet prep probe Send urine for ua C&S She will call if she has increased urinary frequency, urgency or pain with urination    An After Visit Summary was printed and given to the patient.

## 2015-10-16 NOTE — Telephone Encounter (Signed)
Patient aware that rx has been sent in she just picked it up.  Routed to provider for review, encounter closed.

## 2015-10-16 NOTE — Telephone Encounter (Addendum)
Fax from [employer] for Betamethasone valerate ointment states: "This medication is not covered by [employer]. They prefer mometasone or triamcinolone. Please send new rx for one of the above listed if approved Thank You!!".  Dr. Talbert Nan please advise and send in new rx if permitted.

## 2015-10-16 NOTE — Telephone Encounter (Signed)
I've sent in the mometasone ointment. Please inform the patient.

## 2015-10-17 ENCOUNTER — Telehealth: Payer: Self-pay | Admitting: *Deleted

## 2015-10-17 LAB — WET PREP BY MOLECULAR PROBE
Candida species: POSITIVE — AB
GARDNERELLA VAGINALIS: NEGATIVE
Trichomonas vaginosis: NEGATIVE

## 2015-10-17 LAB — URINALYSIS, MICROSCOPIC ONLY
CASTS: NONE SEEN [LPF]
CRYSTALS: NONE SEEN [HPF]
SQUAMOUS EPITHELIAL / LPF: NONE SEEN [HPF] (ref <=5)
YEAST: NONE SEEN [HPF]

## 2015-10-17 MED ORDER — FLUCONAZOLE 150 MG PO TABS: 150.0000 mg | ORAL_TABLET | Freq: Once | ORAL | Status: DC

## 2015-10-17 NOTE — Telephone Encounter (Signed)
Patient is aware of her results. I sent in the Diflucan to [facility] on [address] per patients request -eh

## 2015-10-17 NOTE — Telephone Encounter (Signed)
-----   Message from Salvadore Dom, MD sent at 10/17/2015 12:41 PM EDT ----- Please inform the patient that she does have yeast and send in diflucan 150 mg po x 1, repeat in 72 hours if still symptomatic. #2, no refills. Urine culture is pending.

## 2015-10-18 ENCOUNTER — Telehealth: Payer: Self-pay | Admitting: Obstetrics and Gynecology

## 2015-10-18 ENCOUNTER — Telehealth: Payer: Self-pay

## 2015-10-18 LAB — URINE CULTURE

## 2015-10-18 MED ORDER — NITROFURANTOIN MONOHYD MACRO 100 MG PO CAPS: 100.0000 mg | ORAL_CAPSULE | Freq: Two times a day (BID) | ORAL | Status: DC

## 2015-10-18 NOTE — Telephone Encounter (Signed)
Spoke with patient. Reviewed results for prelimary urine culture again with patient. Patient verbalizes understanding. Patient states she was concerned because she was unable to hold her urine and it burned. Advised patient she will need to start taking Macrobid at this time. Advised of importance of starting antibiotic to prevent worsening symptoms. Patient denies any lower back pain, fever, or chills. Patient will go to pick up Macrobid at this time. Aware if she develops any fevers, chills, or lower back pain she will need to contact the office or be seen at a local ER.  Notes Recorded by Jasmine Awe, RN on 10/18/2015 at 11:03 AM Spoke with patient. Advised of message and results as seen below from [place]. She is agreeable and verbalizes understanding. Rx for Macrobid 100 mg bid x 7 days #14 [facility]RF [facility] [address] per patient preference. Notes Recorded by Salvadore Dom, MD on 10/18/2015 at 9:43 AM Please inform the patient that she does have a UTI, sensitivities are pending, but please start her on macrobid 100 mg BID x 7 days. Have her call with flank pain or fevers  Routing to provider for final review. Patient agreeable to disposition. Will close encounter.

## 2015-10-18 NOTE — Telephone Encounter (Signed)
-----   Message from Jill Dom, MD sent at 10/18/2015  9:43 AM EDT ----- Please inform the patient that she does have a UTI, sensitivities are pending, but please start her on macrobid 100 mg BID x 7 days. Have her call with flank pain or fevers

## 2015-10-18 NOTE — Telephone Encounter (Signed)
Spoke with patient. Advised of message and results as seen below from [place]. She is agreeable and verbalizes understanding. Rx for Macrobid 100 mg bid x 7 days #14 [facility]RF [facility] [address] [facility] patient preference.  Routing to provider for final review. Patient agreeable to disposition. Will close encounter.

## 2015-10-18 NOTE — Telephone Encounter (Signed)
Patient called and said, "I am now 100% sure I have a UTI. They are still waiting for my results from the other day but I was out shopping today and when I came back to use the bathroom I peed myself and it burnt really bad. I have some medication that was called in for a yeast infection but I haven't picked it up yet. I'd like to speak with the nurse please."

## 2015-10-31 ENCOUNTER — Telehealth: Payer: Self-pay | Admitting: Obstetrics & Gynecology

## 2015-10-31 NOTE — Telephone Encounter (Signed)
Spoke with patient. Patient states that when she is having intercourse it feels like "razor blades moving in and out of me." Reports she uses lubrication without relief. Reports pain is only during intercourse, but she feels a burning sensation after that goes away soon after. Denies any urinary symptoms, lower back pain, fever, or chills. Last had intercourse this morning. Feels her vagina area is red and irritated. Denies any current pain. Advised she will need to be seen in the office for further evaluation with Dr.Miller. She is agreeable and verbalizes understanding. Appointment scheduled for tomorrow 11/01/2015 at 8:45 am with Dr.Miller. She is agreeable to date and time.  Routing to provider for final review. Patient agreeable to disposition. Will close encounter.

## 2015-10-31 NOTE — Telephone Encounter (Signed)
Patient having pain and irritation after intercourse.

## 2015-11-01 ENCOUNTER — Ambulatory Visit (INDEPENDENT_AMBULATORY_CARE_PROVIDER_SITE_OTHER): Payer: BLUE CROSS/BLUE SHIELD | Admitting: Obstetrics & Gynecology

## 2015-11-01 VITALS — BP 118/60 | HR 90 | Resp 18 | Wt 134.4 lb

## 2015-11-01 DIAGNOSIS — N951 Menopausal and female climacteric states: Secondary | ICD-10-CM | POA: Diagnosis not present

## 2015-11-01 DIAGNOSIS — R102 Pelvic and perineal pain: Secondary | ICD-10-CM | POA: Diagnosis not present

## 2015-11-01 DIAGNOSIS — N3000 Acute cystitis without hematuria: Secondary | ICD-10-CM | POA: Diagnosis not present

## 2015-11-01 DIAGNOSIS — N941 Unspecified dyspareunia: Secondary | ICD-10-CM | POA: Diagnosis not present

## 2015-11-01 MED ORDER — ESTRADIOL 0.05 MG/24HR TD PTTW: 1.0000 | MEDICATED_PATCH | TRANSDERMAL | Status: DC

## 2015-11-01 MED ORDER — PROGESTERONE MICRONIZED 200 MG PO CAPS: ORAL_CAPSULE | ORAL | Status: DC

## 2015-11-01 NOTE — Patient Instructions (Signed)
Start estrogen patches.  Change twice weekly--Mon/Thur for example.  Start the prometrium tonight and take daily until June 15th.  Stop until July 1 and then take days 1-15 each month.

## 2015-11-01 NOTE — Progress Notes (Signed)
GYNECOLOGY  VISIT   HPI: 55 y.o. G73P2002 Divorced and now remarried Caucasian female with complaint of pain with intercourse.  Pt has felt she had a yeast infection but treated this with OTC products and symptoms improved.  Husband travels and so they may be apart for extended periods of time.  Within the last two weeks, however, every attempt at having intercourse has been unsuccessful due to feeling of tightness, dryness, and pain with insertion.  Pt describes this as feeling like "knives are in my vagina".    Pt has been having off and on menopausal symptoms now for over a year.  She will have prolonged periods of amenorrhea where she feels very symptomatic and then will have a cycle or two and symptoms resolve.  She is on an SSRI for the hot flashes which has helped.  LMP 09/04/14.  She has been trying to not be on HRT but she has decided she is so miserable and sweats so much that she wants to consider using it.  Discussed with patient risks and benefits and specifically the WHI study including but not limited to risks of increased risks of heart disease, MI, stroke, DVT, and breast cancer.  Increased risks of gall bladder disease and change in cholesterol panels also discussed.  Possibility of bleeding was discussed as patient does have a uterus.  Benefits of improved quality of life, improved bone density and decreased risks of colon cancer also discussed.  Dosing and administration discussed.  She would like to use a patch.  AMH was <0.03 in 12/14.  [facility] 4/17 was 63, then 12/18/14 was 124, then 03/27/15 was 70.9.    Of note, she's also had several UTIs in the past couple of years and I suspect this will help as well.  We did discuss this as well.  She doesn't think she has a UTI today as these symptoms are not like her previous ones but she wants to be checked just to be sure.  Urine dip today was negative.  Pt going out of town this weekend to surprise her mother in [place] for Mother's  Day.  GYNECOLOGIC HISTORY: Patient's last menstrual period was 09/04/2014 (exact date). Contraception: vasectomy Menopausal hormone therapy: none but will begin today  Patient Active Problem List   Diagnosis Date Noted  . Bipolar disorder ([place]) 12/20/2014  . Celiac disease 10/07/2014  . OLECRANON BURSITIS 04/02/2009  . ADHD 12/20/2008  . URTICARIA 10/26/2008  . ANEMIA, IRON DEFICIENCY, MICROCYTIC 07/24/2008  . ANXIETY 03/29/2007  . DEPRESSION 03/29/2007  . ASTHMA 03/29/2007  . GERD 03/29/2007    Past Medical History  Diagnosis Date  . Anxiety   . Bipolar depression ([place])     se[facility] Dr G[facility] and sees Trey Paula for  therapy  . GERD (gastroesophageal reflux disease)   . Herpes simplex   . Constipation   . Anemia   . Acne     sees Dr. Jarome Matin for skin checks and acne  . IBS (irritable bowel syndrome)   . Bipolar 1 disorder ([place])   . Insomnia   . Migraines   . Abnormal Pap smear of cervix 2013    ASCUS    Past Surgical History  Procedure Laterality Date  . Esophagogastroduodenoscopy  09/07/2006    Dr Ardis Hughs  . Breast enhancement surgery    . Basal cell carcinoma excision      Dr Amy Martinique  . Colposcopy  10/07/11    CIN  I  . Cesarean section  1996    MEDS:  Reviewed in EPIC and UTD  ALLERGIES: Ciprofloxacin and Sulfa antibiotics  Family History  Problem Relation Age of Onset  . Alcohol abuse    . Arthritis    . Cancer      breast, ovrarian, uterine  . Cancer Mother     SH:  Married, non smoker  Review of Systems  All other systems reviewed and are negative.   PHYSICAL EXAMINATION:    BP 118/60 mmHg  Pulse 90  Resp 18  Wt 134 lb 6.4 oz (60.963 kg)  LMP 09/04/2014 (Exact Date)     Physical Exam  Constitutional: She is oriented to person, place, and time. She appears well-developed and well-nourished.  Cardiovascular: Normal rate and regular rhythm.   Respiratory: Effort normal and breath sounds normal.   GI: Soft. Bowel sounds are normal.  Genitourinary: Uterus normal.    There is no rash, tenderness, lesion or injury on the right labia. There is no rash, tenderness, lesion or injury on the left labia. Cervix exhibits no motion tenderness, no discharge and no friability. Right adnexum displays no mass, no tenderness and no fullness. Left adnexum displays no mass, no tenderness and no fullness. No erythema, tenderness or bleeding in the vagina. No foreign body around the vagina. No signs of injury around the vagina. No vaginal discharge (no significant discharge noted) found.  Lymphadenopathy:       Right: No inguinal adenopathy present.       Left: No inguinal adenopathy present.  Neurological: She is alert and oriented to person, place, and time.  Skin: Skin is warm and dry.  Psychiatric: She has a normal mood and affect.    Chaperone was present for exam.  Assessment: Symptomatic menopause Dyspareunia H/O bipolar d/o Family hx of breast, ovarian cancer H/O recurrent UTIs  Plan: Start estradiol 0.92m patches twice weekly Prometrium daily 1-15 each month but will start now and not stop until June 15.  Will see if she has bleeding with this.  If not, will transition to daily progesterone use Affirm pending Pt will continue to see therapist and [facility] Consider stopping Zoloft in about a month when she will need follow-up if does ok with the HRT.   ~30 minutes spent with patient >50% of time was in face to face discussion of above.

## 2015-11-02 LAB — URINE CULTURE
COLONY COUNT: NO GROWTH
ORGANISM ID, BACTERIA: NO GROWTH

## 2015-11-02 LAB — WET PREP BY MOLECULAR PROBE
CANDIDA SPECIES: NEGATIVE
Gardnerella vaginalis: NEGATIVE
TRICHOMONAS VAG: NEGATIVE

## 2015-11-04 ENCOUNTER — Encounter: Payer: Self-pay | Admitting: Obstetrics & Gynecology

## 2015-11-05 ENCOUNTER — Telehealth: Payer: Self-pay | Admitting: Obstetrics & Gynecology

## 2015-11-05 MED ORDER — ESTRADIOL 0.1 MG/GM VA CREA: TOPICAL_CREAM | VAGINAL | Status: DC

## 2015-11-05 NOTE — Telephone Encounter (Signed)
Patient says she talked yesterday to a nurse and there was supposed to be a vaginal cream that was called in and she checked with her pharmacy and nothing was called in. Best # to reach: [phone] Preferred Pharmacy: [facility]

## 2015-11-05 NOTE — Telephone Encounter (Signed)
Call to patient. She is advised that prescription for Estrace has been sent to her pharmacy. Gave instructions for use and questions answered. She is advised to download savings card from website and bring with her to pharmacy or call back with any coverage concerns of Estrace as patient has [employer].   Patient agreeable and verbalized understanding. Follow up as scheduled with Dr. Sabra Heck.

## 2015-11-25 DIAGNOSIS — M545 Low back pain: Secondary | ICD-10-CM | POA: Diagnosis not present

## 2015-12-03 ENCOUNTER — Ambulatory Visit (INDEPENDENT_AMBULATORY_CARE_PROVIDER_SITE_OTHER): Payer: BLUE CROSS/BLUE SHIELD | Admitting: Obstetrics & Gynecology

## 2015-12-03 VITALS — BP 110/62 | HR 70 | Resp 14 | Wt 130.6 lb

## 2015-12-03 DIAGNOSIS — N951 Menopausal and female climacteric states: Secondary | ICD-10-CM | POA: Diagnosis not present

## 2015-12-03 DIAGNOSIS — Z79890 Hormone replacement therapy (postmenopausal): Secondary | ICD-10-CM

## 2015-12-03 DIAGNOSIS — Z658 Other specified problems related to psychosocial circumstances: Secondary | ICD-10-CM | POA: Diagnosis not present

## 2015-12-03 MED ORDER — ESTRADIOL 0.075 MG/24HR TD PTTW: 1.0000 | MEDICATED_PATCH | TRANSDERMAL | Status: DC

## 2015-12-03 NOTE — Progress Notes (Signed)
GYNECOLOGY  VISIT   HPI: 55 y.o. G23P2002 Divorced Caucasian female here for follow-up after starting HRT.  Pt has been doing well with the hormonal therapy.  She has weaned off the Zoloft.  Stopped having hot flashes almost immediately.  Feels much more "normal".  In couples counseling with her husband.  Married about 2 years.  Together about 5 years.  She is "making plans" for separation if he does not do what is needed.  Is not having any change in decreased libido so would like to increased her dosage if possible.  Is on 0.37m dosage.  As hot flashes are resolved, feel could increase to 0.0778mdosage but I do not think she needs the 0.2m29mosage.  Risks reviewed with pt.  Also, psychosocial component of libido issues discussed.  She is comfortable with this plan.  She "forgot" why she is on the progesterone.  Reminded her of importance to decrease risk of ovarian cancer.  She voices understanding.  Pt has used testosterone in the past and has terrible acne so do not think she is worth trying again.  Pt in agreement.  Last MMG 10/16.  GYNECOLOGIC HISTORY: Patient's last menstrual period was 09/04/2014 (exact date). Menopausal hormone therapy: vivelle dot and prometrium  Patient Active Problem List   Diagnosis Date Noted  . Bipolar disorder ([place]/30/2016  . Celiac disease 10/07/2014  . OLECRANON BURSITIS 04/02/2009  . ADHD 12/20/2008  . URTICARIA 10/26/2008  . ANEMIA, IRON DEFICIENCY, MICROCYTIC 07/24/2008  . ANXIETY 03/29/2007  . DEPRESSION 03/29/2007  . ASTHMA 03/29/2007  . GERD 03/29/2007    Past Medical History  Diagnosis Date  . Anxiety   . Bipolar depression ([place]   [facility] and sees LisTrey Paular  therapy  . GERD (gastroesophageal reflux disease)   . Herpes simplex   . Constipation   . Anemia   . Acne     sees Dr. DreJarome Matinr skin checks and acne  . IBS (irritable bowel syndrome)   . Bipolar 1 disorder ([place] .  Insomnia   . Migraines   . Abnormal Pap smear of cervix 2013    ASCUS    Past Surgical History  Procedure Laterality Date  . Esophagogastroduodenoscopy  09/07/2006    Dr JacArdis Hughs Breast enhancement surgery    . Basal cell carcinoma excision      Dr Amy JorMartinique Colposcopy  10/07/11    CIN I  . Cesarean section  1996    MEDS:  Reviewed in EPIC and UTD  ALLERGIES: Ciprofloxacin and Sulfa antibiotics  Family History  Problem Relation Age of Onset  . Alcohol abuse    . Arthritis    . Cancer      breast, ovrarian, uterine  . Cancer Mother     SH:  Married, non smoker  Review of Systems  All other systems reviewed and are negative.   PHYSICAL EXAMINATION:    BP 110/62 mmHg  Pulse 70  Resp 14  Wt 130 lb 9.6 oz (59.24 kg)  LMP 09/04/2014 (Exact Date)    Physical Exam  Constitutional: She is oriented to person, place, and time. She appears well-developed and well-nourished.  Neurological: She is alert and oriented to person, place, and time.  Skin: Skin is warm and dry.  Psychiatric: She has a normal mood and affect.    Assessment: Menopausal symptoms, much improved Decreased libido, not improved Psychosocial stressors  with spouse.  Considering separation.  Plan: Increase Vivelle dot to 0.084m patches twice weekly and continue Prometrium 2073mdaily.  rx to pharmacy.   Pt to give update in 1 months Support offered.  Does not need any referrals at this time.  Se[facility] therapist and [facility].   ~15 minutes spent with patient >50% of time was in face to face discussion of above.

## 2015-12-04 ENCOUNTER — Encounter: Payer: Self-pay | Admitting: Obstetrics & Gynecology

## 2015-12-04 DIAGNOSIS — Z79890 Hormone replacement therapy (postmenopausal): Secondary | ICD-10-CM | POA: Insufficient documentation

## 2015-12-04 DIAGNOSIS — Z658 Other specified problems related to psychosocial circumstances: Secondary | ICD-10-CM | POA: Insufficient documentation

## 2015-12-06 ENCOUNTER — Telehealth: Payer: Self-pay | Admitting: Obstetrics & Gynecology

## 2015-12-06 MED ORDER — PROGESTERONE MICRONIZED 200 MG PO CAPS: ORAL_CAPSULE | ORAL | Status: DC

## 2015-12-06 NOTE — Telephone Encounter (Signed)
Spoke with patient. Rx for Prometrium 200 mg capsules take 1 po daily #30 12RF was sent to [facility] on file on 11/01/2015. [facility] [address] Rx for Prometrium 200 mg daily #30 2RF [facility] [address]. Patient was seen on 12/03/2015 by Dr.Miller and is to give an update in 1 month with new dosage of Vivelle Dot at 0.075 mg and same dosage of Prometrium 200 mg daily.  Routing to covering provider for final review. Patient agreeable to disposition. Will close encounter.

## 2015-12-06 NOTE — Telephone Encounter (Signed)
Patient went to pick up her prescriptions and only the patch was at her pharmacy. Patient still needs the progesterone called to the pharmacy on file.

## 2015-12-13 DIAGNOSIS — F3181 Bipolar II disorder: Secondary | ICD-10-CM | POA: Diagnosis not present

## 2015-12-15 ENCOUNTER — Other Ambulatory Visit: Payer: Self-pay | Admitting: Obstetrics & Gynecology

## 2015-12-16 NOTE — Telephone Encounter (Signed)
Medication refill request: Restoril   Last AEX:  03-27-15  Next AEX: not scheduled yet Last MMG (if hormonal medication request): 04-02-15 WNL Refill authorized: please advise

## 2015-12-19 DIAGNOSIS — F4323 Adjustment disorder with mixed anxiety and depressed mood: Secondary | ICD-10-CM | POA: Diagnosis not present

## 2016-01-07 ENCOUNTER — Telehealth: Payer: Self-pay | Admitting: Obstetrics & Gynecology

## 2016-01-07 NOTE — Telephone Encounter (Signed)
Call to patient mailbox full, unable to leave message. Patient was seen by Dr. Sabra Heck and increased Vivelle Patch 0.075 and is on Prometrium. Patient was to call with follow up in one month after visit 12/03/15.

## 2016-01-07 NOTE — Telephone Encounter (Signed)
Patient is asking to restart Zoloft. Patient also noticed a small amount of blood this morning when she used the bathroom. Patient said she has not had a cycle in two years.

## 2016-01-07 NOTE — Telephone Encounter (Signed)
Spoke with patient. Patient states that for the last 5-6 days she has been very emotional and has been crying for "no reason." She would like to restart her Zoloft, but wanted to speak with Dr.Miller before restarting. She was previously on Zoloft 100 mg daily. Reports she also had light spotting this morning. She is currently using the Vivelle patch 0.075 mg biweekly. Reports she forgot to replace her patch and went without a patch for 2 days. Placed a new patch last night. Has been taking Prometrium 100 mg daily and has not missed any doses. LMP was two year ago per patient. Denies any urinary symptoms, lower back pain, fever, or chills. Advised I will speak with Dr.Miller and return call with further recommendations. She is agreeable.

## 2016-01-09 NOTE — Telephone Encounter (Signed)
Spoke with patient. Advised of message as seen below from [place]. She is agreeable and verbalizes understanding. Reports she would like to restart her Zoloft at 50 mg daily. States that she has Zoloft 50 mg tablets and 100 mg tablets at home. She would like to use her current rx. Aware if she will be using the 100 mg tablets these will need to be cut in half. She will contact the office when she needs a refill.  Routing to provider for final review. Patient agreeable to disposition. Will close encounter.

## 2016-01-09 NOTE — Telephone Encounter (Signed)
OK to just watch bleeding since missed patch.  If does not stop after one week, needs to call back for PUS or endometrial biopsy.  OK to restart Zoloft at 87m or 1095mdaily.  Ok to send to pharmacy.

## 2016-01-20 DIAGNOSIS — F4323 Adjustment disorder with mixed anxiety and depressed mood: Secondary | ICD-10-CM | POA: Diagnosis not present

## 2016-02-04 ENCOUNTER — Telehealth: Payer: Self-pay | Admitting: Obstetrics & Gynecology

## 2016-02-04 NOTE — Telephone Encounter (Signed)
Patient is having symptoms of a yeast infection and would like diflucan called in. She is down in [place] with her sick mother and would like [facility] [address] at [phone].

## 2016-02-04 NOTE — Telephone Encounter (Signed)
Yes.  Ok to send in Rx for Diflucan 154m po x 1, repeat 72 hours.

## 2016-02-04 NOTE — Telephone Encounter (Signed)
Spoke with patient. Patient reports she is in [place] caring for her mother who is sick. States that for a few days she has felt like she may be getting a yeast infection. Woke up today and has thick white vaginal discharge with itching. Asking for rx for Diflucan. Advised I will speak with Dr.Miller regarding rx request and return call. She is agreeable.   Dr.Miller, okay to send in rx for Diflucan 150 mg take 1 tablet, repeat in 72 hours if symptoms persist #2 0RF?

## 2016-02-05 MED ORDER — FLUCONAZOLE 150 MG PO TABS: 150.0000 mg | ORAL_TABLET | Freq: Once | ORAL | 0 refills | Status: AC

## 2016-02-05 NOTE — Telephone Encounter (Signed)
Rx for Diflucan 150 mg po x 1, repeat in 72 hours #2 0RF sent to patients preferred pharmacy. Patient has been notified.  Routing to provider for final review. Patient agreeable to disposition. Will close encounter.

## 2016-02-13 DIAGNOSIS — F3181 Bipolar II disorder: Secondary | ICD-10-CM | POA: Diagnosis not present

## 2016-02-17 DIAGNOSIS — F4323 Adjustment disorder with mixed anxiety and depressed mood: Secondary | ICD-10-CM | POA: Diagnosis not present

## 2016-02-19 ENCOUNTER — Other Ambulatory Visit: Payer: Self-pay | Admitting: Obstetrics & Gynecology

## 2016-02-19 NOTE — Telephone Encounter (Signed)
Medication refill request: vivelle dot 0.032m Last AEX:  03-27-15 Next AEX: not scheduled yet Last MMG (if hormonal medication request): 04-03-15 category b density birads 1:neg Refill authorized: last refilled 12-03-15 #8 with 2 refills  Called pt for update to see how she is doing on medication.

## 2016-02-19 NOTE — Telephone Encounter (Signed)
Spoke to patient & she states she has not had any other bleeding & would like refill. aex is scheduled for 05-08-16 with Dr Sabra Heck. Pt states she will discuss at her visit if this is the best option for her. Please approve rx.

## 2016-02-19 NOTE — Telephone Encounter (Signed)
Patient is returning a call to [place].

## 2016-03-02 DIAGNOSIS — F4323 Adjustment disorder with mixed anxiety and depressed mood: Secondary | ICD-10-CM | POA: Diagnosis not present

## 2016-03-04 DIAGNOSIS — L570 Actinic keratosis: Secondary | ICD-10-CM | POA: Diagnosis not present

## 2016-03-04 DIAGNOSIS — B9689 Other specified bacterial agents as the cause of diseases classified elsewhere: Secondary | ICD-10-CM | POA: Diagnosis not present

## 2016-03-04 DIAGNOSIS — I788 Other diseases of capillaries: Secondary | ICD-10-CM | POA: Diagnosis not present

## 2016-03-04 DIAGNOSIS — L0202 Furuncle of face: Secondary | ICD-10-CM | POA: Diagnosis not present

## 2016-03-04 DIAGNOSIS — X32XXXD Exposure to sunlight, subsequent encounter: Secondary | ICD-10-CM | POA: Diagnosis not present

## 2016-03-04 DIAGNOSIS — Z1283 Encounter for screening for malignant neoplasm of skin: Secondary | ICD-10-CM | POA: Diagnosis not present

## 2016-03-09 ENCOUNTER — Other Ambulatory Visit: Payer: Self-pay | Admitting: Obstetrics and Gynecology

## 2016-03-09 DIAGNOSIS — F4323 Adjustment disorder with mixed anxiety and depressed mood: Secondary | ICD-10-CM | POA: Diagnosis not present

## 2016-03-09 NOTE — Telephone Encounter (Signed)
Medication refill request: Prometrium  Last AEX:  03-27-15 Next AEX: 05-08-16 Last MMG (if hormonal medication request): 04-03-15 WNL Refill authorized: please advise

## 2016-03-21 ENCOUNTER — Other Ambulatory Visit: Payer: Self-pay | Admitting: Internal Medicine

## 2016-03-21 ENCOUNTER — Other Ambulatory Visit: Payer: Self-pay | Admitting: Family Medicine

## 2016-03-23 DIAGNOSIS — F4323 Adjustment disorder with mixed anxiety and depressed mood: Secondary | ICD-10-CM | POA: Diagnosis not present

## 2016-03-23 NOTE — Telephone Encounter (Signed)
Patient  of dr Sarajane Jews routing to his desktop.

## 2016-03-24 NOTE — Telephone Encounter (Signed)
Call in #[phone] with 5 rf 

## 2016-03-30 DIAGNOSIS — F4323 Adjustment disorder with mixed anxiety and depressed mood: Secondary | ICD-10-CM | POA: Diagnosis not present

## 2016-04-07 DIAGNOSIS — F4323 Adjustment disorder with mixed anxiety and depressed mood: Secondary | ICD-10-CM | POA: Diagnosis not present

## 2016-04-09 ENCOUNTER — Ambulatory Visit (INDEPENDENT_AMBULATORY_CARE_PROVIDER_SITE_OTHER): Payer: BLUE CROSS/BLUE SHIELD | Admitting: Obstetrics & Gynecology

## 2016-04-09 ENCOUNTER — Encounter: Payer: Self-pay | Admitting: Obstetrics & Gynecology

## 2016-04-09 VITALS — BP 110/72 | HR 80 | Resp 14 | Ht 67.75 in | Wt 134.8 lb

## 2016-04-09 DIAGNOSIS — N9089 Other specified noninflammatory disorders of vulva and perineum: Secondary | ICD-10-CM | POA: Diagnosis not present

## 2016-04-09 DIAGNOSIS — N898 Other specified noninflammatory disorders of vagina: Secondary | ICD-10-CM | POA: Diagnosis not present

## 2016-04-09 MED ORDER — FLUCONAZOLE 150 MG PO TABS: 150.0000 mg | ORAL_TABLET | Freq: Once | ORAL | 1 refills | Status: AC

## 2016-04-09 NOTE — Patient Instructions (Signed)
Mometasone ointment use twice daily

## 2016-04-09 NOTE — Progress Notes (Signed)
GYNECOLOGY  VISIT   HPI: 55 y.o. G36P2002 Married Caucasian female with four day hx of increased vaginal discharge that has worsened over the last four days.  Used some hydrogen peroxide/water douching and then used an OTC vaginal one day treatment.  Now feeling very sore and irritated.  Not SA this past week.  Denies new sexual partner.  Denies fever, pelvic pain, vaginal bleeding or urinary changes.  GYNECOLOGIC HISTORY: Patient's last menstrual period was 09/04/2014 (exact date). Contraception: PMP Menopausal hormone therapy: vivelle dot 0.059m patch and prometirum 2052mdaily  Patient Active Problem List   Diagnosis Date Noted  . Postmenopausal HRT (hormone replacement therapy) 12/04/2015  . Psychosocial stressors 12/04/2015  . Bipolar disorder ([place]06/30/2016  . Celiac disease 10/07/2014  . OLECRANON BURSITIS 04/02/2009  . ADHD 12/20/2008  . URTICARIA 10/26/2008  . ANEMIA, IRON DEFICIENCY, MICROCYTIC 07/24/2008  . ANXIETY 03/29/2007  . DEPRESSION 03/29/2007  . ASTHMA 03/29/2007  . GERD 03/29/2007    Past Medical History:  Diagnosis Date  . Abnormal Pap smear of cervix 2013   ASCUS  . Acne    sees Dr. DrJarome Matinor skin checks and acne  . Anemia   . Anxiety   . Bipolar 1 disorder ([facility]  . Bipolar depression ([facility]CO[facility]   sees Dr Me[facility]ala [facility]cht [facility] and sees LiTrey Paulaor  therapy  . Constipation   . GERD (gastroesophageal reflux disease)   . Herpes simplex   . IBS (irritable bowel syndrome)   . Insomnia   . Migraines     Past Surgical History:  Procedure Laterality Date  . BASAL CELL CARCINOMA EXCISION     Dr Amy JoMartinique. BREAST ENHANCEMENT SURGERY    . CESAREAN SECTION  1996  . COLPOSCOPY  10/07/11   CIN I  . ESOPHAGOGASTRODUODENOSCOPY  09/07/2006   Dr JaArdis Hughs  MEDS:  Reviewed in EPIC and UTD  ALLERGIES: Ciprofloxacin and Sulfa antibiotics  Family History  Problem Relation Age of Onset  . Alcohol abuse    . Arthritis    .  Cancer      breast, ovrarian, uterine  . Cancer Mother     SH:  Married, non smoker  Review of Systems  Genitourinary:       Vulvar irritation  All other systems reviewed and are negative.   PHYSICAL EXAMINATION:    BP 110/72 (BP Location: Right Arm, Patient Position: Sitting, Cuff Size: Normal)   Pulse 80   Resp 14   Ht 5' 7.75" (1.721 m)   Wt 134 lb 12.8 oz (61.1 kg)   LMP 09/04/2014 (Exact Date)   BMI 20.65 kg/m     General appearance: alert, cooperative and appears stated age Abdomen: soft, non-tender; bowel sounds normal; no masses,  no organomegaly  Pelvic: External genitalia:  Erythema within labia majora              Urethra:  normal appearing urethra with no masses, tenderness or lesions              Bartholins and Skenes: normal                 Vagina: normal appearing vagina with normal color and discharge, evidence of treatment with cream noted, no lesions              Cervix: no lesions              Bimanual Exam:  Uterus:  normal size, contour, position, consistency, mobility, non-tender              Adnexa: no mass, fullness, tenderness              Anus:  normal sphincter tone, no lesions  Chaperone was present for exam.  Assessment: Vulvar irritation after treatment with one day OTC yeast ovule  Plan: Pt is going to use steroid ointment that she has on hand but for irritation.  She is advised to use NO other topical products unless she lets me know first Diflucan 121m po x 1, repeat 72 hours Affirm pending

## 2016-04-10 ENCOUNTER — Other Ambulatory Visit: Payer: Self-pay | Admitting: Obstetrics & Gynecology

## 2016-04-10 DIAGNOSIS — Z1231 Encounter for screening mammogram for malignant neoplasm of breast: Secondary | ICD-10-CM

## 2016-04-10 LAB — WET PREP BY MOLECULAR PROBE
CANDIDA SPECIES: NEGATIVE
Gardnerella vaginalis: NEGATIVE
Trichomonas vaginosis: NEGATIVE

## 2016-04-20 ENCOUNTER — Telehealth: Payer: Self-pay | Admitting: *Deleted

## 2016-04-20 ENCOUNTER — Telehealth: Payer: Self-pay | Admitting: Obstetrics & Gynecology

## 2016-04-20 NOTE — Telephone Encounter (Signed)
Patient would like to speak with nurse about her estrogen patches and some spotting that she is experiencing.

## 2016-04-20 NOTE — Telephone Encounter (Signed)
See additional telephone encounter. Will close this encounter.

## 2016-04-20 NOTE — Telephone Encounter (Signed)
Call to patient to let her know that she has results to review via MyChart. Patient requests to review over the phone. RN advised affirm testing was negative and patient states the steroid cream has helped and her symptoms have resolved completely. Patient states she is having another issue. States she has been religious about wearing and changing her estrogen patches every Tuesday and Saturday. Patient states this past Saturday (10/28) she noticed she was having some spotting when she changed her patch. She states this morning when she went to the restroom, her bleeding had increased slightly. She is only having to wear a panty liner, but didn't know what else she needed to do. Office visit offered, but patient declined. RN advised this message would be sent to Dr. Sabra Heck for review, and our office would call with any additional recommendations.   Routing to provider for review.

## 2016-04-20 NOTE — Telephone Encounter (Signed)
-----   Message from Megan Salon, MD sent at 04/10/2016  5:39 PM EDT ----- Notified pt of negative results via mychart.

## 2016-04-20 NOTE — Telephone Encounter (Signed)
Returned call to patient. Message given to patient as seen below from Dr. Sabra Heck. Patient verbalized understanding. Aware to call the office if she has any additional bleeding.   Routing to provider for final review. Patient agreeable to disposition. Will close encounter.

## 2016-04-20 NOTE — Telephone Encounter (Signed)
Have her stop her prometrium for 10 days.  It's ok if she had a menstrual cycle.  Then restart the prometrium.  This should "reset" everything and the bleeding should be stopped.  She should call if has additional bleeding (after the 10 days off the prometrium.).  Thanks.

## 2016-04-27 ENCOUNTER — Telehealth: Payer: Self-pay | Admitting: Obstetrics & Gynecology

## 2016-04-27 DIAGNOSIS — Z0000 Encounter for general adult medical examination without abnormal findings: Secondary | ICD-10-CM

## 2016-04-27 NOTE — Telephone Encounter (Signed)
Patient scheduled to have fasting labwork done on Friday the 10th. Will need orders put in system.

## 2016-04-27 NOTE — Telephone Encounter (Signed)
Dr.Miller, future orders placed for CBC, CMP, TSH, lipid panel. HgB A1c, and vitamin D level. Please review and advise any additional labs needed. Patient has fasting lab appointment on 05/01/2016 and aex on 05/12/2016.

## 2016-04-28 DIAGNOSIS — F4323 Adjustment disorder with mixed anxiety and depressed mood: Secondary | ICD-10-CM | POA: Diagnosis not present

## 2016-04-29 ENCOUNTER — Ambulatory Visit
Admission: RE | Admit: 2016-04-29 | Discharge: 2016-04-29 | Disposition: A | Discharge status: Home / Self Care | Payer: BLUE CROSS/BLUE SHIELD | Source: Ambulatory Visit | Attending: Obstetrics & Gynecology | Admitting: Obstetrics & Gynecology

## 2016-04-29 ENCOUNTER — Other Ambulatory Visit: Payer: Self-pay | Admitting: Obstetrics & Gynecology

## 2016-04-29 DIAGNOSIS — Z1231 Encounter for screening mammogram for malignant neoplasm of breast: Secondary | ICD-10-CM | POA: Diagnosis not present

## 2016-04-29 NOTE — Telephone Encounter (Signed)
Added Hep C antibody as well.  Everything else is good.  Encounter closed.

## 2016-05-01 ENCOUNTER — Other Ambulatory Visit (INDEPENDENT_AMBULATORY_CARE_PROVIDER_SITE_OTHER): Payer: BLUE CROSS/BLUE SHIELD

## 2016-05-01 DIAGNOSIS — Z0000 Encounter for general adult medical examination without abnormal findings: Secondary | ICD-10-CM

## 2016-05-01 LAB — CBC
HCT: 40.8 % (ref 35.0–45.0)
Hemoglobin: 13.2 g/dL (ref 11.7–15.5)
MCH: 30.4 pg (ref 27.0–33.0)
MCHC: 32.4 g/dL (ref 32.0–36.0)
MCV: 94 fL (ref 80.0–100.0)
MPV: 10.3 fL (ref 7.5–12.5)
PLATELETS: 219 10*3/uL (ref 140–400)
RBC: 4.34 MIL/uL (ref 3.80–5.10)
RDW: 13.3 % (ref 11.0–15.0)
WBC: 5.8 10*3/uL (ref 3.8–10.8)

## 2016-05-01 LAB — LIPID PANEL
Cholesterol: 192 mg/dL (ref <200)
HDL: 86 mg/dL (ref >50)
LDL CALC: 93 mg/dL (ref <100)
Total CHOL/HDL Ratio: 2.2 Ratio (ref <5.0)
Triglycerides: 65 mg/dL (ref <150)
VLDL: 13 mg/dL (ref <30)

## 2016-05-01 LAB — COMPREHENSIVE METABOLIC PANEL
ALT: 12 U/L (ref 6–29)
AST: 17 U/L (ref 10–35)
Albumin: 4.2 g/dL (ref 3.6–5.1)
Alkaline Phosphatase: 40 U/L (ref 33–130)
BUN: 18 mg/dL (ref 7–25)
CHLORIDE: 104 mmol/L (ref 98–110)
CO2: 28 mmol/L (ref 20–31)
CREATININE: 0.9 mg/dL (ref 0.50–1.05)
Calcium: 9 mg/dL (ref 8.6–10.4)
Glucose, Bld: 104 mg/dL — ABNORMAL HIGH (ref 65–99)
Potassium: 4.5 mmol/L (ref 3.5–5.3)
SODIUM: 141 mmol/L (ref 135–146)
Total Bilirubin: 0.4 mg/dL (ref 0.2–1.2)
Total Protein: 6.4 g/dL (ref 6.1–8.1)

## 2016-05-01 LAB — TSH: TSH: 2.35 m[IU]/L

## 2016-05-02 LAB — VITAMIN D 25 HYDROXY (VIT D DEFICIENCY, FRACTURES): VIT D 25 HYDROXY: 34 ng/mL (ref 30–100)

## 2016-05-02 LAB — HEMOGLOBIN A1C
HEMOGLOBIN A1C: 5.3 % (ref <5.7)
Mean Plasma Glucose: 105 mg/dL

## 2016-05-04 ENCOUNTER — Other Ambulatory Visit: Payer: Self-pay | Admitting: Obstetrics & Gynecology

## 2016-05-04 ENCOUNTER — Telehealth: Payer: Self-pay | Admitting: Obstetrics & Gynecology

## 2016-05-04 MED ORDER — TEMAZEPAM 15 MG PO CAPS: 15.0000 mg | ORAL_CAPSULE | Freq: Every evening | ORAL | 0 refills | Status: DC | PRN

## 2016-05-04 NOTE — Telephone Encounter (Signed)
Patient would like a [facility]ill on [facility] [address] at [phone]. Also ask if that is something she has to call in for every month or will there be refills.

## 2016-05-04 NOTE — Telephone Encounter (Signed)
Return call to patient. Per ROI can leave message on [phone]. VM confirms first and last name. Left message that RX has been sent for one month and she can discuss refills when she comes to see Dr Sabra Heck for annual. (scheduled for 05-12-16) Left message to call back if additional questions.  Annual exam with Dr Sabra Heck 05-12-16 at 1245.  Encounter closed.

## 2016-05-04 NOTE — Telephone Encounter (Signed)
Med refill request: Temazepam 72m caps Last AEX: 03/27/15 Next AEX: 05/12/16 Last MMG (if hormonal med) Refill authorized: Please Advise? Last ordered 12/17/15 #30/1 RF -mm

## 2016-05-08 ENCOUNTER — Ambulatory Visit: Payer: BLUE CROSS/BLUE SHIELD | Admitting: Obstetrics & Gynecology

## 2016-05-11 ENCOUNTER — Other Ambulatory Visit: Payer: Self-pay | Admitting: Obstetrics & Gynecology

## 2016-05-11 NOTE — Telephone Encounter (Signed)
Medication refill request: Vivelle Dot  Last AEX:  03-27-15 Next AEX: 05-12-16  Last MMG (if hormonal medication request): 05-01-16 WNL  Refill authorized: please advise

## 2016-05-12 ENCOUNTER — Ambulatory Visit: Payer: BLUE CROSS/BLUE SHIELD | Admitting: Obstetrics & Gynecology

## 2016-05-18 ENCOUNTER — Other Ambulatory Visit: Payer: Self-pay | Admitting: Internal Medicine

## 2016-05-18 ENCOUNTER — Telehealth: Payer: Self-pay | Admitting: Obstetrics & Gynecology

## 2016-05-18 MED ORDER — PROGESTERONE MICRONIZED 200 MG PO CAPS: ORAL_CAPSULE | ORAL | 1 refills | Status: DC

## 2016-05-18 NOTE — Telephone Encounter (Signed)
Spoke with patient. Advised of message as seen below from [place]. Patient is agreeable and verbalizes understanding. Rx for Prometrium 300 mg take 1 tablet days 1-15 each month #15 1RF sent to pharmacy on file. Patient is agreeable. Next aex is scheduled for 06/05/2016 at 2:45 pm with Dr.Miller.  Routing to provider for final review. Patient agreeable to disposition. Will close encounter.

## 2016-05-18 NOTE — Telephone Encounter (Signed)
Spoke with patient. Patient states that on 05/14/2016 she began to have bleeding like a normal menses. Reports severe cramping and lower back pain. Took 800 mg of Ibuprofen without relief. Reports she is having light bleeding at this time. Changing her tampon once per day. Is on Prometrium 200 mg daily and Estradiol patch twice per week. Denies missing any pills or changing her patch late. Reports she had bleeding in October and stopped her Prometrium for 10 days per Dr.miller's recommendation. Restarted her Prometrium after 10 days and had no bleeding until 11/23. Advised I will review with [email or web address] and return call with further recommendations. Patient is agreeable.

## 2016-05-18 NOTE — Telephone Encounter (Signed)
Just a clarification about the Prometrium dosage.  You ordered 223m, correct, not 3062m  Thanks.

## 2016-05-18 NOTE — Telephone Encounter (Signed)
Appears she needs to be taking the prometrium days 1-15 each month and then off the rest of the days.  This will make any bleeding occur right after the stopped the prometrium.  That way it is predictable.  Please advise and change rx to read days 1-15 only.  Thanks.

## 2016-05-18 NOTE — Telephone Encounter (Signed)
Patient had her period over thanksgiving and was told to call if she got her period again she may need "something done". Patient said she had severe cramping with this period.

## 2016-05-19 NOTE — Telephone Encounter (Signed)
I apologize. Prometrium 200 mg take 1 tablet po days 1-15 each month #15 1RF was sent to patient's pharmacy on file.  Routing to [place] for final review. Encounter previously closed.

## 2016-05-21 DIAGNOSIS — F4323 Adjustment disorder with mixed anxiety and depressed mood: Secondary | ICD-10-CM | POA: Diagnosis not present

## 2016-05-22 DIAGNOSIS — F3181 Bipolar II disorder: Secondary | ICD-10-CM | POA: Diagnosis not present

## 2016-05-30 ENCOUNTER — Other Ambulatory Visit: Payer: Self-pay | Admitting: Obstetrics & Gynecology

## 2016-06-03 DIAGNOSIS — F4323 Adjustment disorder with mixed anxiety and depressed mood: Secondary | ICD-10-CM | POA: Diagnosis not present

## 2016-06-05 ENCOUNTER — Ambulatory Visit: Payer: BLUE CROSS/BLUE SHIELD | Admitting: Obstetrics & Gynecology

## 2016-06-08 ENCOUNTER — Ambulatory Visit: Payer: BLUE CROSS/BLUE SHIELD | Admitting: Obstetrics & Gynecology

## 2016-06-08 NOTE — Progress Notes (Deleted)
55 y.o. [id number] MarriedCaucasianF here for annual exam.    Patient's last menstrual period was 09/04/2014 (exact date).          Sexually active: {yes no:314532}  The current method of family planning is vasectomy.    Exercising: {yes LA:453646}  {types:19826} Smoker:  Former smoker  Health Maintenance: Pap:  03/27/15 negative, HR HPV negative  History of abnormal Pap:  yes MMG: 05/01/16 BIRADS 1 negative Colonoscopy:  04/20/14 with Dr. Collene Mares- repeat 5 years  BMD:   10/13/05  TDaP:  10/16/13  Pneumonia vaccine(s):  *** Zostavax:   *** Hep C testing: *** Screening Labs: ***, Hb today: ***, Urine today: ***   reports that she has quit smoking. She started smoking about 33 years ago. She has never used smokeless tobacco. She reports that she drinks alcohol. She reports that she does not use drugs.  Past Medical History:  Diagnosis Date  . Abnormal Pap smear of cervix 2013   ASCUS  . Acne    sees Dr. Jarome Matin for skin checks and acne  . Anemia   . Anxiety   . Bipolar 1 disorder ([place])   . Bipolar depression ([place])    sees Dr Gala Murdoch [facility] and sees Trey Paula for  therapy  . Constipation   . GERD (gastroesophageal reflux disease)   . Herpes simplex   . IBS (irritable bowel syndrome)   . Insomnia   . Migraines     Past Surgical History:  Procedure Laterality Date  . BASAL CELL CARCINOMA EXCISION     Dr Amy Martinique  . BREAST ENHANCEMENT SURGERY    . CESAREAN SECTION  1996  . COLPOSCOPY  10/07/11   CIN I  . ESOPHAGOGASTRODUODENOSCOPY  09/07/2006   Dr Ardis Hughs    Current Outpatient Prescriptions  Medication Sig Dispense Refill  . ALPRAZolam (XANAX) 0.25 MG tablet Reported on 12/03/2015    . Biotin (BIOTIN 5000) 5 MG CAPS Take 1,000 mg by mouth daily. Reported on 12/03/2015    . estradiol (VIVELLE-DOT) 0.075 MG/24HR PLACE 1 PATCH ONTO THE SKIN 2 (TWO) TIMES A WEEK. 8 patch 1  . ibuprofen (ADVIL,MOTRIN) 800 MG tablet TAKE 1 TABLET (800 MG TOTAL) BY  MOUTH EVERY 8 (EIGHT) HOURS AS NEEDED FOR MODERATE PAIN. 60 tablet 5  . ibuprofen (ADVIL,MOTRIN) 800 MG tablet TAKE 1 TABLET (800 MG TOTAL) BY MOUTH EVERY 8 (EIGHT) HOURS AS NEEDED FOR MODERATE PAIN. 90 tablet 5  . lamoTRIgine (LAMICTAL) 100 MG tablet Take 125 mg by mouth daily.     . meloxicam (MOBIC) 15 MG tablet TAKE 1 TABLET BY MOUTH EVERY DAY AS NEEDED WITH FOOD  4  . mometasone (ELOCON) 0.1 % ointment Apply a pea sized amount topically BID x 1-2 weeks 15 g 0  . Multiple Vitamin (MULTI-VITAMIN PO) Take by mouth daily.    . Nutritional Supplements (ESTROVEN PO) Take by mouth daily. Reported on 12/03/2015    . progesterone (PROMETRIUM) 200 MG capsule Take 1 tablet daily days 1-15 of each month 15 capsule 1  . sertraline (ZOLOFT) 100 MG tablet TAKE 1 TABLET BY MOUTH DAILY. 30 tablet 10  . SOLODYN 115 MG TB24 TAKE ONE TABLET BY MOUTH EVERY DAY WITH FOOD AVOID PROLONGED EXPOSURE TO THE SUN  3  . temazepam (RESTORIL) 15 MG capsule Take 1 capsule (15 mg total) by mouth at bedtime as needed. for sleep 30 capsule 0  . traMADol (ULTRAM) 50 MG tablet TAKE 1/2 TABLET BY MOUTH  EVERY 8 HRS AS NEEDED** ONLY 30 DAYS SUPPLY** 60 tablet 5  . valACYclovir (VALTREX) 500 MG tablet Take 1 tablet (500 mg total) by mouth daily. 90 tablet 4   No current facility-administered medications for this visit.     Family History  Problem Relation Age of Onset  . Alcohol abuse    . Arthritis    . Cancer      breast, ovrarian, uterine  . Cancer Mother     ROS:  Pertinent items are noted in HPI.  Otherwise, a comprehensive ROS was negative.  Exam:   LMP 09/04/2014 (Exact Date)   Weight change: @WEIGHTCHANGE @ Height:      Ht Readings from Last 3 Encounters:  04/09/16 5' 7.75" (1.721 m)  10/09/15 5' 8.25" (1.734 m)  05/09/15 5' 8.25" (1.734 m)    General appearance: alert, cooperative and appears stated age Head: Normocephalic, without obvious abnormality, atraumatic Neck: no adenopathy, supple, symmetrical,  trachea midline and thyroid {EXAM; THYROID:18604} Lungs: clear to auscultation bilaterally Breasts: {Exam; breast:13139::"normal appearance, no masses or tenderness"} Heart: regular rate and rhythm Abdomen: soft, non-tender; bowel sounds normal; no masses,  no organomegaly Extremities: extremities normal, atraumatic, no cyanosis or edema Skin: Skin color, texture, turgor normal. No rashes or lesions Lymph nodes: Cervical, supraclavicular, and axillary nodes normal. No abnormal inguinal nodes palpated Neurologic: Grossly normal   Pelvic: External genitalia:  no lesions              Urethra:  normal appearing urethra with no masses, tenderness or lesions              Bartholins and Skenes: normal                 Vagina: normal appearing vagina with normal color and discharge, no lesions              Cervix: {exam; cervix:14595}              Pap taken: {yes no:314532} Bimanual Exam:  Uterus:  {exam; uterus:12215}              Adnexa: {exam; adnexa:12223}               Rectovaginal: Confirms               Anus:  normal sphincter tone, no lesions  Chaperone was present for exam.  A:  Well Woman with normal exam  P:   {plan; gyn:5269::"mammogram","pap smear","return annually or prn"}

## 2016-06-09 DIAGNOSIS — R0683 Snoring: Secondary | ICD-10-CM | POA: Diagnosis not present

## 2016-06-16 ENCOUNTER — Other Ambulatory Visit (HOSPITAL_BASED_OUTPATIENT_CLINIC_OR_DEPARTMENT_OTHER): Payer: Self-pay

## 2016-06-16 DIAGNOSIS — G4730 Sleep apnea, unspecified: Secondary | ICD-10-CM

## 2016-06-16 DIAGNOSIS — R0683 Snoring: Secondary | ICD-10-CM

## 2016-06-25 ENCOUNTER — Other Ambulatory Visit: Payer: Self-pay | Admitting: Obstetrics & Gynecology

## 2016-06-25 MED ORDER — LIDOCAINE HCL 2 % EX GEL: Freq: Three times a day (TID) | CUTANEOUS | 0 refills | Status: DC

## 2016-06-25 NOTE — Telephone Encounter (Signed)
Patient said "I'm having a break out and need a refill of lidocaine". [facility] [address] [phone]

## 2016-06-25 NOTE — Telephone Encounter (Signed)
Medication refill request: lidocaine  Last AEX:  03/27/15 SM  Next AEX: 07/06/16 SM  Last MMG (if hormonal medication request): 05/01/16 BIRADS1:Neg  Refill authorized: 06/07/12 #35m/11R. Today please advise.

## 2016-06-26 ENCOUNTER — Other Ambulatory Visit: Payer: Self-pay | Admitting: Obstetrics & Gynecology

## 2016-06-26 NOTE — Telephone Encounter (Signed)
Medication refill request: Valacyclovir Last AEX:  04/06/15 SM Next AEX: 07/06/16 SM Last MMG (if hormonal medication request): 05/01/16 [facility] Refill authorized: 03/27/15 #90 4R. Please advise. Thank you.

## 2016-06-29 ENCOUNTER — Ambulatory Visit (HOSPITAL_BASED_OUTPATIENT_CLINIC_OR_DEPARTMENT_OTHER): Payer: BLUE CROSS/BLUE SHIELD | Attending: Otolaryngology | Admitting: Internal Medicine

## 2016-06-29 DIAGNOSIS — G4730 Sleep apnea, unspecified: Secondary | ICD-10-CM | POA: Diagnosis present

## 2016-06-29 DIAGNOSIS — G4733 Obstructive sleep apnea (adult) (pediatric): Secondary | ICD-10-CM | POA: Insufficient documentation

## 2016-06-29 DIAGNOSIS — R0683 Snoring: Secondary | ICD-10-CM | POA: Diagnosis present

## 2016-07-01 ENCOUNTER — Other Ambulatory Visit: Payer: Self-pay | Admitting: Obstetrics and Gynecology

## 2016-07-01 NOTE — Telephone Encounter (Signed)
Medication refill request: temazepam  Last AEX:  03-27-15  Last OV: 04-09-16  Next AEX: 07-06-16  Last MMG (if hormonal medication request): 05-01-16 Refill authorized: please advise

## 2016-07-03 NOTE — Telephone Encounter (Signed)
Prescription for temazepam 90m capsules. #30,0RF [facility] [address] Fax #: [phone]

## 2016-07-06 ENCOUNTER — Ambulatory Visit (INDEPENDENT_AMBULATORY_CARE_PROVIDER_SITE_OTHER): Payer: BLUE CROSS/BLUE SHIELD | Admitting: Obstetrics & Gynecology

## 2016-07-06 ENCOUNTER — Encounter: Payer: Self-pay | Admitting: Obstetrics & Gynecology

## 2016-07-06 VITALS — BP 122/80 | HR 68 | Resp 14 | Ht 68.0 in | Wt 142.0 lb

## 2016-07-06 DIAGNOSIS — R6882 Decreased libido: Secondary | ICD-10-CM | POA: Diagnosis not present

## 2016-07-06 DIAGNOSIS — Z205 Contact with and (suspected) exposure to viral hepatitis: Secondary | ICD-10-CM

## 2016-07-06 DIAGNOSIS — Z0000 Encounter for general adult medical examination without abnormal findings: Secondary | ICD-10-CM | POA: Diagnosis not present

## 2016-07-06 DIAGNOSIS — Z01419 Encounter for gynecological examination (general) (routine) without abnormal findings: Secondary | ICD-10-CM | POA: Diagnosis not present

## 2016-07-06 DIAGNOSIS — N926 Irregular menstruation, unspecified: Secondary | ICD-10-CM | POA: Diagnosis not present

## 2016-07-06 LAB — POCT URINALYSIS DIPSTICK
Bilirubin, UA: NEGATIVE
GLUCOSE UA: NEGATIVE
KETONES UA: NEGATIVE
Leukocytes, UA: NEGATIVE
Nitrite, UA: NEGATIVE
Protein, UA: NEGATIVE
UROBILINOGEN UA: NEGATIVE
pH, UA: 6

## 2016-07-06 LAB — HEPATITIS C ANTIBODY: HCV AB: NEGATIVE

## 2016-07-06 MED ORDER — PROGESTERONE MICRONIZED 200 MG PO CAPS: ORAL_CAPSULE | ORAL | 4 refills | Status: DC

## 2016-07-06 MED ORDER — TEMAZEPAM 15 MG PO CAPS: 15.0000 mg | ORAL_CAPSULE | Freq: Every evening | ORAL | 2 refills | Status: DC | PRN

## 2016-07-06 MED ORDER — ESTRADIOL 0.075 MG/24HR TD PTTW: 1.0000 | MEDICATED_PATCH | TRANSDERMAL | 4 refills | Status: DC

## 2016-07-06 MED ORDER — VALACYCLOVIR HCL 500 MG PO TABS: 500.0000 mg | ORAL_TABLET | Freq: Every day | ORAL | 2 refills | Status: DC

## 2016-07-06 NOTE — Progress Notes (Signed)
56 y.o. [id number] MarriedCaucasianF here for annual exam.  Had laser to her face for treatment of scars and lines.  Has seen three different individuals before deciding on the fractional C02.    Having regular bleeding with HRT.  Taking progesterone now days 1-15.  This is just the second month.    Still having some sleep issues.  Did have a sleep study last week.  Waiting on the results.             Sexually active: Yes.    The current method of family planning is vasectomy.    Exercising: Yes.    treadmill Smoker:  Former smoker  Health Maintenance: Pap:  03/27/15 negative, HR HPV negative  History of abnormal Pap:  yes MMG:  05/01/16 BIRADS 1 negative  Colonoscopy:  04/20/14 with Dr. Collene Mares- repeat 5 years  BMD:   10/13/05 normal  TDaP:  10/16/13  Pneumonia vaccine(s):  never Zostavax:   never Hep C testing: drawn today Screening Labs: done in 11/17, Hb today: same, Urine today: trace RBC's   reports that she has quit smoking. She started smoking about 33 years ago. She has never used smokeless tobacco. She reports that she drinks alcohol. She reports that she does not use drugs.  Past Medical History:  Diagnosis Date  . Abnormal Pap smear of cervix 2013   ASCUS  . Acne    sees Dr. Jarome Matin for skin checks and acne  . Anemia   . Anxiety   . Bipolar 1 disorder ([place])   . Bipolar depression ([place])    se[facility] Dr G[facility] Murdoch [facility] and sees Trey Paula for  therapy  . Constipation   . GERD (gastroesophageal reflux disease)   . Herpes simplex   . IBS (irritable bowel syndrome)   . Insomnia   . Migraines     Past Surgical History:  Procedure Laterality Date  . BASAL CELL CARCINOMA EXCISION     Dr Amy Martinique  . BREAST ENHANCEMENT SURGERY    . CESAREAN SECTION  1996  . COLPOSCOPY  10/07/11   CIN I  . ESOPHAGOGASTRODUODENOSCOPY  09/07/2006   Dr Ardis Hughs    Current Outpatient Prescriptions  Medication Sig Dispense Refill  . ALPRAZolam (XANAX) 0.25 MG tablet  Reported on 12/03/2015    . Biotin (BIOTIN 5000) 5 MG CAPS Take 1,000 mg by mouth daily. Reported on 12/03/2015    . estradiol (VIVELLE-DOT) 0.075 MG/24HR PLACE 1 PATCH ONTO THE SKIN 2 (TWO) TIMES A WEEK. 8 patch 1  . ibuprofen (ADVIL,MOTRIN) 800 MG tablet TAKE 1 TABLET (800 MG TOTAL) BY MOUTH EVERY 8 (EIGHT) HOURS AS NEEDED FOR MODERATE PAIN. 60 tablet 5  . lamoTRIgine (LAMICTAL) 100 MG tablet Take 125 mg by mouth daily.     Marland Kitchen lidocaine (XYLOCAINE JELLY) 2 % jelly Apply topically 3 (three) times daily. 30 mL 0  . meloxicam (MOBIC) 15 MG tablet TAKE 1 TABLET BY MOUTH EVERY DAY AS NEEDED WITH FOOD  4  . mometasone (ELOCON) 0.1 % ointment Apply a pea sized amount topically BID x 1-2 weeks 15 g 0  . Multiple Vitamin (MULTI-VITAMIN PO) Take by mouth daily.    . Nutritional Supplements (ESTROVEN PO) Take by mouth daily. Reported on 12/03/2015    . progesterone (PROMETRIUM) 200 MG capsule Take 1 tablet daily days 1-15 of each month 15 capsule 1  . sertraline (ZOLOFT) 100 MG tablet TAKE 1 TABLET BY MOUTH DAILY. 30 tablet 10  .  temazepam (RESTORIL) 15 MG capsule TAKE ONE CAPSULE BY MOUTH AT BEDTIME AS NEEDED 30 capsule 0  . traMADol (ULTRAM) 50 MG tablet TAKE 1/2 TABLET BY MOUTH EVERY 8 HRS AS NEEDED** ONLY 30 DAYS SUPPLY** 60 tablet 5  . valACYclovir (VALTREX) 500 MG tablet TAKE 1 TABLET (500 MG TOTAL) BY MOUTH DAILY.**30 DAYS SUPPLY** 30 tablet 2   No current facility-administered medications for this visit.     Family History  Problem Relation Age of Onset  . Alcohol abuse    . Arthritis    . Cancer      breast, ovrarian, uterine  . Cancer Mother     ROS:  Pertinent items are noted in HPI.  Otherwise, a comprehensive ROS was negative.  Exam:   BP 122/80 (BP Location: Right Arm, Patient Position: Sitting, Cuff Size: Normal)   Pulse 68   Resp 14   Ht 5' 8"  (1.727 m)   Wt 142 lb (64.4 kg)   LMP 09/04/2014 (Exact Date)   BMI 21.59 kg/m   Weight change: +7#   Height: 5' 8"  (172.7 cm)  Ht  Readings from Last 3 Encounters:  07/06/16 5' 8"  (1.727 m)  04/09/16 5' 7.75" (1.721 m)  10/09/15 5' 8.25" (1.734 m)   General appearance: alert, cooperative and appears stated age Head: Normocephalic, without obvious abnormality, atraumatic Neck: no adenopathy, supple, symmetrical, trachea midline and thyroid normal to inspection and palpation Lungs: clear to auscultation bilaterally Breasts: normal appearance, no masses or tenderness, bilateral implants Heart: regular rate and rhythm Abdomen: soft, non-tender; bowel sounds normal; no masses,  no organomegaly Extremities: extremities normal, atraumatic, no cyanosis or edema Skin: Skin color, texture, turgor normal. No rashes or lesions Lymph nodes: Cervical, supraclavicular, and axillary nodes normal. No abnormal inguinal nodes palpated Neurologic: Grossly normal   Pelvic: External genitalia:  no lesions              Urethra:  normal appearing urethra with no masses, tenderness or lesions              Bartholins and Skenes: normal                 Vagina: normal appearing vagina with normal color and discharge, no lesions              Cervix: no lesions              Pap taken: No. Bimanual Exam:  Uterus:  normal size, contour, position, consistency, mobility, non-tender              Adnexa: normal adnexa and no mass, fullness, tenderness               Rectovaginal: Confirms               Anus:  normal sphincter tone, no lesions  Chaperone was present for exam.  A:  Well Woman with normal exam PMP on HRT Irregular bleeding with HRT, recently changed to taking progesterone days 1-15 each month H/o Bipolar d/o H/O CIN 1. H/O colposcopy with biopsies 4/13.  Neg pap with neg HR HPV 2015 and 2016    H/O HSV  P: Mammogram guidelines reviewed Pap not done today.  Pap and neg HR HPV 2015 and 2016 Restoril 41m nightly prn.  #30/2RF Rx for Valtrex 5034mdaily.  #30/2RF Estradiol patch 0.07521mwice weekly.  #24/4  RF Prometrium 200m56mys 1-15 each month.  #45/4RF Testosterone, FSH, Estradiol, FSH pending Return annually or prn

## 2016-07-07 LAB — FOLLICLE STIMULATING HORMONE: FSH: 55.6 m[IU]/mL

## 2016-07-07 LAB — ESTRADIOL: Estradiol: 86 pg/mL

## 2016-07-08 ENCOUNTER — Other Ambulatory Visit: Payer: Self-pay | Admitting: Obstetrics & Gynecology

## 2016-07-08 LAB — TESTOS,TOTAL,FREE AND SHBG (FEMALE)
SEX HORMONE BINDING GLOB.: 67 nmol/L (ref 17–124)
TESTOSTERONE,FREE: 0.8 pg/mL (ref 0.1–6.4)
Testosterone,Total,LC/MS/MS: 9 ng/dL (ref 2–45)

## 2016-07-15 ENCOUNTER — Other Ambulatory Visit (HOSPITAL_BASED_OUTPATIENT_CLINIC_OR_DEPARTMENT_OTHER): Payer: Self-pay

## 2016-07-15 DIAGNOSIS — G4730 Sleep apnea, unspecified: Secondary | ICD-10-CM

## 2016-07-15 DIAGNOSIS — R0683 Snoring: Secondary | ICD-10-CM

## 2016-07-16 ENCOUNTER — Telehealth: Payer: Self-pay | Admitting: Obstetrics & Gynecology

## 2016-07-16 NOTE — Telephone Encounter (Signed)
Spoke with patient. Patient states she was in to see Dr. Sabra Heck for AEX last week and discussed the insomnia she was experiencing. Patient states she has not slept in days. Patient states she has been doing some research on gabapentin and would like to know Dr. Ammie Ferrier thoughts and if it can be prescribed? Patient states she has not received sleep study results yet and was advised can take up to a month for results. Advised patient will review with Dr. Sabra Heck and return call with recommendations. Advised patient Dr. Sabra Heck is seeing patients, response may not be immediate, patient is agreeable.  Dr. Assunta Curtis advise?

## 2016-07-16 NOTE — Telephone Encounter (Signed)
Patient called and said, "My insomnia has been really bad, I mean really bad lately. I'd like to know what Dr. Sabra Heck thinks about Gabapentin to help with this . I had a sleep test recently but I haven't gotten the results."

## 2016-07-18 DIAGNOSIS — G4733 Obstructive sleep apnea (adult) (pediatric): Secondary | ICD-10-CM | POA: Diagnosis not present

## 2016-07-18 NOTE — Procedures (Signed)
  Patient Name: Jill Mcintyre, Jill Mcintyre Date: 06/30/2016 Gender: Female D.O.B: 05/17/1961 Age (years): 55 Referring Provider: Melida Quitter Height (inches): 58 Interpreting Physician: Baird Lyons MD, ABSM Weight (lbs): 138 RPSGT: Jacolyn Reedy BMI: 21 MRN: [id number] Neck Size: 12.00 CLINICAL INFORMATION Sleep Study Type: unattended HST     Indication for sleep study: OSA     Epworth Sleepiness Score: 4  SLEEP STUDY TECHNIQUE A multi-channel overnight portable sleep study was performed. The channels recorded were: nasal airflow, thoracic respiratory movement, and oxygen saturation with a pulse oximetry. Snoring was also monitored.  MEDICATIONS Patient self administered medications include: none reported  SLEEP ARCHITECTURE Patient was studied for 345.2 minutes. The sleep efficiency was 100.0 % and the patient was supine for 99.9%. The arousal index was 0.0 per hour.  RESPIRATORY PARAMETERS The overall AHI was 31.6 per hour, with a central apnea index of 0.3 per hour.  The oxygen nadir was 79% during sleep.  CARDIAC DATA Mean heart rate during sleep was 71.4 bpm.  IMPRESSIONS - Severe obstructive sleep apnea occurred during this study (AHI = 31.6/h). - No significant central sleep apnea occurred during this study (CAI = 0.3/h). - Severe oxygen desaturation was noted during this study (Min O2 = 79%, Mean saturation 94%). - Patient snored.  DIAGNOSIS - Obstructive Sleep Apnea (327.23 [G47.33 ICD-10])  RECOMMENDATIONS - CPAP titration is usually first choice for scores in this range. - Positional therapy avoiding supine position during sleep. - Avoid alcohol, sedatives and other CNS depressants that may worsen sleep apnea and disrupt normal sleep architecture. - Sleep hygiene should be reviewed to assess factors that may improve sleep quality. - Weight management and regular exercise should be initiated or continued.  [Electronically signed]  07/18/2016 10:56 AM  Baird Lyons MD, ABSM Diplomate, American Board of Sleep Medicine   NPI: [id number]  [place], American Board of Sleep Medicine  ELECTRONICALLY SIGNED ON:  07/18/2016, 10:53 AM [place] PH: [phone]   FX: [phone] [place]

## 2016-07-20 DIAGNOSIS — F4323 Adjustment disorder with mixed anxiety and depressed mood: Secondary | ICD-10-CM | POA: Diagnosis not present

## 2016-07-22 NOTE — Telephone Encounter (Signed)
Can you please call Dr. Ronnald Ramp office and ask if I can use Trazodone with her other medications due to insomnia pt is having.  Also, Dr. Ronnald Ramp may want to see her personally and make other recommendations.  This is fine with me if she does.  Thanks.

## 2016-07-22 NOTE — Telephone Encounter (Addendum)
Spoke with Jill Mcintyre at [facility]. Advised as seen below per Dr. Sabra Heck. Jill Mcintyre advised will review with provider, allow at least 24 hours for response. Advised can return call back to [place] at [phone].

## 2016-07-22 NOTE — Telephone Encounter (Signed)
Spoke with patient, advised as seen below per Dr. Sabra Heck. Patient states she would like to try Trazodone if an option. Patient states she sees Dr. Pauline Good. Advised patient Dr. Sabra Heck is out of the office today, will return 2/1, response may not be immediate. Patient is agreeable.  Dr. Sabra Heck, please advise on Trazodone?

## 2016-07-22 NOTE — Telephone Encounter (Signed)
Please let pt know that although I use this for pt's with hot flashes and it can make pt's sleepy, it's not typically used in that way and it's not even a typical "off label" use for this medication.  Trazodone might work better for her but with her SSRI use, I'd need her to check with her psychiatrist about it and she could not take the Trazodone with the restoril or xanax.

## 2016-07-24 NOTE — Telephone Encounter (Signed)
Was advised message left for [place] on office answering machine to return call to [place] at [facility]. Returned call, no answer or option to leave message.

## 2016-07-24 NOTE — Telephone Encounter (Signed)
Left message to call Jill Mcintyre at [phone].  

## 2016-07-27 NOTE — Telephone Encounter (Signed)
Spoke with [place] at [facility]. Was advised per Pauline Good, would like for patient to be seen in her office for evaluation of insomnia and discussion of trazodone. Was advised they have left message for patient to return call for scheduling.

## 2016-07-27 NOTE — Telephone Encounter (Signed)
Left detailed message, ok per current dpr. Advised patient to follow-up with Pauline Good, FNP for scheduling appointment to evaluate insomnia and discuss med changes. Advised patient to return call to office for any additional questions/concerns.  Routing to provider for final review. Patient is agreeable to disposition. Will close encounter.

## 2016-07-28 DIAGNOSIS — G4733 Obstructive sleep apnea (adult) (pediatric): Secondary | ICD-10-CM | POA: Diagnosis not present

## 2016-07-31 DIAGNOSIS — X32XXXD Exposure to sunlight, subsequent encounter: Secondary | ICD-10-CM | POA: Diagnosis not present

## 2016-07-31 DIAGNOSIS — B078 Other viral warts: Secondary | ICD-10-CM | POA: Diagnosis not present

## 2016-07-31 DIAGNOSIS — L820 Inflamed seborrheic keratosis: Secondary | ICD-10-CM | POA: Diagnosis not present

## 2016-07-31 DIAGNOSIS — L570 Actinic keratosis: Secondary | ICD-10-CM | POA: Diagnosis not present

## 2016-07-31 DIAGNOSIS — L578 Other skin changes due to chronic exposure to nonionizing radiation: Secondary | ICD-10-CM | POA: Diagnosis not present

## 2016-08-03 ENCOUNTER — Telehealth: Payer: Self-pay | Admitting: Obstetrics & Gynecology

## 2016-08-03 NOTE — Telephone Encounter (Signed)
Left message to call Yaw Escoto at [phone].  

## 2016-08-03 NOTE — Telephone Encounter (Addendum)
Patient called to let Dr. Sabra Heck know she woke up with a "full blown period this morning."  She said she was told to call with this information.  Last seen: 07/06/16

## 2016-08-03 NOTE — Telephone Encounter (Signed)
Spoke with patient. Patient states Dr. Sabra Heck wanted to know if she has another cycle. Patient states LMP 08/03/16. Patient reports taking progesterone days 1-15 and estradiol. Patient denies any other GYN or urinary complaints. Patient states she did have to take 800 mg motrin for cramping. Patient states she has started a cpap and no recent med changes for insomnia, wants to see how the cpap does. Advised patient would update Dr. Sabra Heck and return call with recommendations, patient is agreeable.  Dr. Sabra Heck -please advise?

## 2016-08-10 NOTE — Telephone Encounter (Signed)
No additional recommendations at this time.  She is bleeding because of the HRT.  As long as the bleeding is after the progesterone, nothing is needed.

## 2016-08-10 NOTE — Telephone Encounter (Signed)
Spoke with patient, advised as seen below per Dr. Sabra Heck. Patient verbalizes understanding and is agreeable.  Routing to provider for final review. Patient is agreeable to disposition. Will close encounter.

## 2016-08-19 ENCOUNTER — Ambulatory Visit (INDEPENDENT_AMBULATORY_CARE_PROVIDER_SITE_OTHER): Payer: BLUE CROSS/BLUE SHIELD | Admitting: Psychology

## 2016-08-19 DIAGNOSIS — F312 Bipolar disorder, current episode manic severe with psychotic features: Secondary | ICD-10-CM

## 2016-08-20 DIAGNOSIS — F3181 Bipolar II disorder: Secondary | ICD-10-CM | POA: Diagnosis not present

## 2016-09-01 ENCOUNTER — Telehealth: Payer: Self-pay | Admitting: Obstetrics & Gynecology

## 2016-09-01 DIAGNOSIS — N950 Postmenopausal bleeding: Secondary | ICD-10-CM

## 2016-09-01 NOTE — Telephone Encounter (Signed)
Patient is calling to report that her cycle has been very heavy since 08/29/16. Patient is wondering if she should "stop taking the medication that Dr.Miller prescribed"?

## 2016-09-01 NOTE — Telephone Encounter (Signed)
Spoke with patient. Patient states she is calling for update for Dr. Sabra Heck and recommendations. Patient states she is still taking progesterone daily days 1-15 and Vivelle-dot patch 2 times per week. Patient states bleeding started 3/10 and has been heavier with more cramps this month. Patient reports using 3 super tampons per day. Patient states she has also been experiencing increase in nipple soreness/tenderness. Patient reports taking 800 mg ibuprofen for cramping. Advised patient to continue to calendar menses. Advised patient will update Dr. Sabra Heck, will return call with any additional recommendations. Patient is agreeable.    Dr. Sabra Heck -any additional recommendations?

## 2016-09-02 ENCOUNTER — Telehealth: Payer: Self-pay | Admitting: Obstetrics & Gynecology

## 2016-09-02 NOTE — Telephone Encounter (Signed)
Spoke with patient, advised of recommendations as seen below per Dr. Sabra Heck. Patient scheduled for PUS 3/15 at 1pm with 1:30pm consult to follow with Dr. Sabra Heck. Patient states she would like to know insurance benefits prior to OV. Advised patient would forward message to insurance and benefits department for review and call back. Patient verbalizes understanding and is agreeable to date and time.   Routing to provider for final review. Patient is agreeable to disposition. Will close encounter.

## 2016-09-02 NOTE — Telephone Encounter (Signed)
Needs pus.  Please schedule for Thursday if possible.

## 2016-09-02 NOTE — Telephone Encounter (Signed)
Called patient to review benefits for a scheduled appointment on 09/03/16. Left Voicemail requesting a call back.

## 2016-09-02 NOTE — Telephone Encounter (Signed)
Left message to call Jame Morrell at [phone].  

## 2016-09-02 NOTE — Telephone Encounter (Signed)
Spoke with patient regarding benefit for ultrasound. Patient understood and agreeable. Patient ready to schedule. Patient scheduled 09/03/16 with Dr Sabra Heck. Patient aware of date, arrival time and cancellation policy. No further questions. Ok to close

## 2016-09-03 ENCOUNTER — Ambulatory Visit (INDEPENDENT_AMBULATORY_CARE_PROVIDER_SITE_OTHER): Payer: BLUE CROSS/BLUE SHIELD | Admitting: Obstetrics & Gynecology

## 2016-09-03 ENCOUNTER — Ambulatory Visit (INDEPENDENT_AMBULATORY_CARE_PROVIDER_SITE_OTHER): Payer: BLUE CROSS/BLUE SHIELD

## 2016-09-03 ENCOUNTER — Encounter: Payer: Self-pay | Admitting: Obstetrics & Gynecology

## 2016-09-03 ENCOUNTER — Other Ambulatory Visit: Payer: Self-pay | Admitting: *Deleted

## 2016-09-03 VITALS — BP 118/74 | HR 76 | Resp 14 | Ht 68.0 in | Wt 139.0 lb

## 2016-09-03 DIAGNOSIS — Z79890 Hormone replacement therapy (postmenopausal): Secondary | ICD-10-CM | POA: Diagnosis not present

## 2016-09-03 DIAGNOSIS — N950 Postmenopausal bleeding: Secondary | ICD-10-CM | POA: Diagnosis not present

## 2016-09-03 NOTE — Progress Notes (Signed)
56 y.o. G85P2002 Married Caucasian female here for pelvic ultrasound due to recurrent PMP bleeding on HRT.  Pt has been having fairly regular monthly bleeding since starting on HRT.  Her most recent bleeding, however, was heavier than normal.  It started about four days ago and was heavy for two days.  Then it tapered off and has stopped completely today.  Pt is feeling a little breast tenderness as well.  She would like to stop bleeding, if that were possible.  Asks about endometrial ablation today.  Reviewed with pt this should not be done in a PMP female.    Patient's last menstrual period was 09/14/2014.  Contraception: PMP  Findings:  UTERUS: 10.0 x 5.6cm x 4.5cm EMS: 2.2-3.53m, symmetric ADNEXA: Left ovary: 1.5 x 0.7 x 0.8cm       Right ovary: 1.6 x 1.0 x 0.9cm.  Both ovaries are atrophic CUL DE SAC: no free fluid  Discussion:  Findings reviewed with pt.  D/ w pt I had considered an endometrial biopsy but with thin and symmetric endometrium and bleeding is with the progesterone as it should be occurring.  Pt and I discussed lower HRT to see if bleeding will improve vs IUD use to help control bleeding.  Pt prefers to lower HRT to see how bleeding will change and to see how she feels from a hormonal standpoint.  For now, she will just cut her patches in 1/2 and use twice weekly.  She will not change her progesterone dose.  Assessment:  PMP bleeding on HRT with progesterone withdrawal  Plan:  Decrease estradiol to 0.03745mpatches twice weekly.  Pt will follow up in 4-6 weeks.  ~15 minutes spent with patient >50% of time was in face to face discussion of above.      ADDENDUM; ON REPORT PAGE #1, THE INDICATION STATES BRCA POSITIVE AND WAS TYPED IN BY MISTAKE.  THE ABOVE PATIENT IS NOT BRCA POSITIVE./GDrue StagerHLacie Scotts

## 2016-09-28 ENCOUNTER — Ambulatory Visit: Payer: BLUE CROSS/BLUE SHIELD | Admitting: Psychology

## 2016-10-12 ENCOUNTER — Ambulatory Visit (INDEPENDENT_AMBULATORY_CARE_PROVIDER_SITE_OTHER): Payer: BLUE CROSS/BLUE SHIELD | Admitting: Psychology

## 2016-10-12 DIAGNOSIS — F312 Bipolar disorder, current episode manic severe with psychotic features: Secondary | ICD-10-CM

## 2016-10-13 DIAGNOSIS — M9902 Segmental and somatic dysfunction of thoracic region: Secondary | ICD-10-CM | POA: Diagnosis not present

## 2016-10-13 DIAGNOSIS — M9908 Segmental and somatic dysfunction of rib cage: Secondary | ICD-10-CM | POA: Diagnosis not present

## 2016-10-15 DIAGNOSIS — M9908 Segmental and somatic dysfunction of rib cage: Secondary | ICD-10-CM | POA: Diagnosis not present

## 2016-10-15 DIAGNOSIS — M9902 Segmental and somatic dysfunction of thoracic region: Secondary | ICD-10-CM | POA: Diagnosis not present

## 2016-10-20 DIAGNOSIS — Z9989 Dependence on other enabling machines and devices: Secondary | ICD-10-CM | POA: Diagnosis not present

## 2016-10-20 DIAGNOSIS — G4733 Obstructive sleep apnea (adult) (pediatric): Secondary | ICD-10-CM | POA: Diagnosis not present

## 2016-10-21 DIAGNOSIS — Z9989 Dependence on other enabling machines and devices: Secondary | ICD-10-CM

## 2016-10-21 DIAGNOSIS — F3181 Bipolar II disorder: Secondary | ICD-10-CM | POA: Diagnosis not present

## 2016-10-21 DIAGNOSIS — G4733 Obstructive sleep apnea (adult) (pediatric): Secondary | ICD-10-CM | POA: Insufficient documentation

## 2016-10-23 DIAGNOSIS — G4733 Obstructive sleep apnea (adult) (pediatric): Secondary | ICD-10-CM | POA: Diagnosis not present

## 2016-10-25 DIAGNOSIS — G4733 Obstructive sleep apnea (adult) (pediatric): Secondary | ICD-10-CM | POA: Diagnosis not present

## 2016-11-04 ENCOUNTER — Ambulatory Visit (INDEPENDENT_AMBULATORY_CARE_PROVIDER_SITE_OTHER): Payer: BLUE CROSS/BLUE SHIELD | Admitting: Family Medicine

## 2016-11-04 ENCOUNTER — Encounter: Payer: Self-pay | Admitting: Family Medicine

## 2016-11-04 VITALS — BP 105/78 | HR 70 | Temp 98.7°F | Ht 68.0 in | Wt 135.0 lb

## 2016-11-04 DIAGNOSIS — J209 Acute bronchitis, unspecified: Secondary | ICD-10-CM

## 2016-11-04 MED ORDER — AZITHROMYCIN 250 MG PO TABS: ORAL_TABLET | ORAL | 0 refills | Status: DC

## 2016-11-04 MED ORDER — METHYLPREDNISOLONE ACETATE 80 MG/ML IJ SUSP
120.0000 mg | Freq: Once | INTRAMUSCULAR | Status: AC
  Administered 2016-11-04: 120 mg via INTRAMUSCULAR

## 2016-11-04 NOTE — Progress Notes (Signed)
   Subjective:    Patient ID: Jill Mcintyre, female    DOB: 04-01-1961, 56 y.o.   MRN: [id number]  HPI Here for 5 days of chest congestion and coughing up green sputum. No fever. On Mucinex.    Review of Systems  Constitutional: Negative.   HENT: Negative.   Eyes: Negative.   Respiratory: Positive for cough, chest tightness, shortness of breath and wheezing.   Cardiovascular: Negative.        Objective:   Physical Exam  Constitutional: She appears well-developed and well-nourished.  HENT:  Right Ear: External ear normal.  Left Ear: External ear normal.  Nose: Nose normal.  Mouth/Throat: Oropharynx is clear and moist.  Eyes: Conjunctivae are normal.  Neck: No thyromegaly present.  Pulmonary/Chest: Effort normal. No respiratory distress. She has no wheezes. She has no rales.  Scattered rhonchi   Lymphadenopathy:    She has no cervical adenopathy.          Assessment & Plan:  Bronchitis, treat with a Zpack and a steroid shot.  Alysia Penna, MD

## 2016-11-04 NOTE — Patient Instructions (Signed)
WE NOW OFFER   [facility] FAST TRACK!!!  SAME DAY Appointments for ACUTE CARE  Such as: Sprains, Injuries, cuts, abrasions, rashes, muscle pain, joint pain, back pain Colds, flu, sore throats, headache, allergies, cough, fever  Ear pain, sinus and eye infections Abdominal pain, nausea, vomiting, diarrhea, upset stomach Animal/insect bites  3 Easy Ways to Schedule: Walk-In Scheduling Call in scheduling Mychart Sign-up: [email or web address]         

## 2016-11-04 NOTE — Addendum Note (Signed)
Addended by: Aggie Hacker A on: 11/04/2016 01:50 PM   Modules accepted: Orders

## 2016-11-05 ENCOUNTER — Ambulatory Visit (INDEPENDENT_AMBULATORY_CARE_PROVIDER_SITE_OTHER): Payer: BLUE CROSS/BLUE SHIELD | Admitting: Psychology

## 2016-11-05 DIAGNOSIS — F312 Bipolar disorder, current episode manic severe with psychotic features: Secondary | ICD-10-CM | POA: Diagnosis not present

## 2016-11-18 ENCOUNTER — Other Ambulatory Visit: Payer: Self-pay | Admitting: Family Medicine

## 2016-11-18 NOTE — Telephone Encounter (Signed)
Call in #[phone] with 5 rf 

## 2016-11-19 ENCOUNTER — Ambulatory Visit: Payer: Self-pay | Admitting: Psychology

## 2016-11-30 ENCOUNTER — Ambulatory Visit (INDEPENDENT_AMBULATORY_CARE_PROVIDER_SITE_OTHER): Payer: BLUE CROSS/BLUE SHIELD | Admitting: Psychology

## 2016-11-30 DIAGNOSIS — F312 Bipolar disorder, current episode manic severe with psychotic features: Secondary | ICD-10-CM | POA: Diagnosis not present

## 2016-12-07 ENCOUNTER — Telehealth: Payer: Self-pay | Admitting: Obstetrics & Gynecology

## 2016-12-07 ENCOUNTER — Other Ambulatory Visit: Payer: Self-pay | Admitting: Obstetrics & Gynecology

## 2016-12-07 ENCOUNTER — Ambulatory Visit (INDEPENDENT_AMBULATORY_CARE_PROVIDER_SITE_OTHER): Payer: BLUE CROSS/BLUE SHIELD | Admitting: Psychology

## 2016-12-07 DIAGNOSIS — F312 Bipolar disorder, current episode manic severe with psychotic features: Secondary | ICD-10-CM | POA: Diagnosis not present

## 2016-12-07 MED ORDER — LIDOCAINE HCL 2 % EX GEL: Freq: Three times a day (TID) | CUTANEOUS | 0 refills | Status: DC

## 2016-12-07 NOTE — Telephone Encounter (Signed)
Patient called and requested a prescription for "lidocaine for a pretty bad outbreak" be sent to her pharmacy on file.

## 2016-12-07 NOTE — Telephone Encounter (Signed)
Left message, advised requested RX to pharmacy on file, return call to office at [phone] for any additional questions.   Routing to provider for final review. Patient is agreeable to disposition. Will close encounter.

## 2016-12-07 NOTE — Telephone Encounter (Signed)
Dr. Sabra Heck, see patient message below, ok to refill lidocaine?

## 2016-12-07 NOTE — Telephone Encounter (Signed)
Rx sent to pharmacy on file.  Thanks.

## 2016-12-28 ENCOUNTER — Ambulatory Visit (INDEPENDENT_AMBULATORY_CARE_PROVIDER_SITE_OTHER): Payer: BLUE CROSS/BLUE SHIELD | Admitting: Psychology

## 2016-12-28 DIAGNOSIS — F312 Bipolar disorder, current episode manic severe with psychotic features: Secondary | ICD-10-CM | POA: Diagnosis not present

## 2017-01-01 DIAGNOSIS — L821 Other seborrheic keratosis: Secondary | ICD-10-CM | POA: Diagnosis not present

## 2017-01-01 DIAGNOSIS — D2239 Melanocytic nevi of other parts of face: Secondary | ICD-10-CM | POA: Diagnosis not present

## 2017-01-01 DIAGNOSIS — L820 Inflamed seborrheic keratosis: Secondary | ICD-10-CM | POA: Diagnosis not present

## 2017-01-04 ENCOUNTER — Ambulatory Visit (INDEPENDENT_AMBULATORY_CARE_PROVIDER_SITE_OTHER): Payer: BLUE CROSS/BLUE SHIELD | Admitting: Psychology

## 2017-01-04 DIAGNOSIS — F312 Bipolar disorder, current episode manic severe with psychotic features: Secondary | ICD-10-CM | POA: Diagnosis not present

## 2017-01-05 DIAGNOSIS — F3181 Bipolar II disorder: Secondary | ICD-10-CM | POA: Diagnosis not present

## 2017-01-18 ENCOUNTER — Ambulatory Visit (INDEPENDENT_AMBULATORY_CARE_PROVIDER_SITE_OTHER): Payer: BLUE CROSS/BLUE SHIELD | Admitting: Psychology

## 2017-01-18 DIAGNOSIS — F312 Bipolar disorder, current episode manic severe with psychotic features: Secondary | ICD-10-CM

## 2017-01-20 DIAGNOSIS — D485 Neoplasm of uncertain behavior of skin: Secondary | ICD-10-CM | POA: Diagnosis not present

## 2017-01-20 DIAGNOSIS — D2239 Melanocytic nevi of other parts of face: Secondary | ICD-10-CM | POA: Diagnosis not present

## 2017-01-25 ENCOUNTER — Ambulatory Visit: Payer: BLUE CROSS/BLUE SHIELD | Admitting: Psychology

## 2017-02-04 ENCOUNTER — Ambulatory Visit (INDEPENDENT_AMBULATORY_CARE_PROVIDER_SITE_OTHER): Payer: BLUE CROSS/BLUE SHIELD | Admitting: Psychology

## 2017-02-04 DIAGNOSIS — F312 Bipolar disorder, current episode manic severe with psychotic features: Secondary | ICD-10-CM | POA: Diagnosis not present

## 2017-02-08 DIAGNOSIS — F3181 Bipolar II disorder: Secondary | ICD-10-CM | POA: Diagnosis not present

## 2017-02-10 ENCOUNTER — Ambulatory Visit: Payer: BLUE CROSS/BLUE SHIELD | Admitting: Psychology

## 2017-03-05 ENCOUNTER — Ambulatory Visit: Payer: BLUE CROSS/BLUE SHIELD | Admitting: Psychology

## 2017-03-06 ENCOUNTER — Other Ambulatory Visit: Payer: Self-pay | Admitting: Obstetrics & Gynecology

## 2017-03-08 DIAGNOSIS — L570 Actinic keratosis: Secondary | ICD-10-CM | POA: Diagnosis not present

## 2017-03-08 DIAGNOSIS — L821 Other seborrheic keratosis: Secondary | ICD-10-CM | POA: Diagnosis not present

## 2017-03-08 DIAGNOSIS — D2272 Melanocytic nevi of left lower limb, including hip: Secondary | ICD-10-CM | POA: Diagnosis not present

## 2017-03-08 DIAGNOSIS — L812 Freckles: Secondary | ICD-10-CM | POA: Diagnosis not present

## 2017-03-08 DIAGNOSIS — D1801 Hemangioma of skin and subcutaneous tissue: Secondary | ICD-10-CM | POA: Diagnosis not present

## 2017-03-08 DIAGNOSIS — L820 Inflamed seborrheic keratosis: Secondary | ICD-10-CM | POA: Diagnosis not present

## 2017-03-08 DIAGNOSIS — C44519 Basal cell carcinoma of skin of other part of trunk: Secondary | ICD-10-CM | POA: Diagnosis not present

## 2017-03-08 NOTE — Telephone Encounter (Signed)
Medication refill request: Progesterone 256m Last AEX:  07/06/16 SM Next AEX: not scheduled yet Last MMG (if hormonal medication request): 04/29/16 BIRADS 1 negative/density d Refill authorized: 07/06/16 #45 w/4 refills; today please advise; tried calling patient to schedule next AEX, no answer, left message for patient to call back and schedule.

## 2017-03-11 ENCOUNTER — Encounter: Payer: Self-pay | Admitting: Family Medicine

## 2017-03-29 ENCOUNTER — Ambulatory Visit (INDEPENDENT_AMBULATORY_CARE_PROVIDER_SITE_OTHER): Payer: BLUE CROSS/BLUE SHIELD | Admitting: Psychology

## 2017-03-29 DIAGNOSIS — F312 Bipolar disorder, current episode manic severe with psychotic features: Secondary | ICD-10-CM

## 2017-04-12 DIAGNOSIS — F3181 Bipolar II disorder: Secondary | ICD-10-CM | POA: Diagnosis not present

## 2017-04-21 ENCOUNTER — Encounter: Payer: Self-pay | Admitting: Family Medicine

## 2017-04-21 ENCOUNTER — Ambulatory Visit (INDEPENDENT_AMBULATORY_CARE_PROVIDER_SITE_OTHER): Payer: BLUE CROSS/BLUE SHIELD | Admitting: Family Medicine

## 2017-04-21 VITALS — BP 116/70 | HR 70 | Temp 98.8°F | Ht 68.0 in | Wt 133.6 lb

## 2017-04-21 DIAGNOSIS — J018 Other acute sinusitis: Secondary | ICD-10-CM | POA: Diagnosis not present

## 2017-04-21 DIAGNOSIS — F411 Generalized anxiety disorder: Secondary | ICD-10-CM

## 2017-04-21 MED ORDER — AZITHROMYCIN 250 MG PO TABS: ORAL_TABLET | ORAL | 0 refills | Status: DC

## 2017-04-21 MED ORDER — LORAZEPAM 1 MG PO TABS: 1.0000 mg | ORAL_TABLET | Freq: Two times a day (BID) | ORAL | 2 refills | Status: DC | PRN

## 2017-04-21 NOTE — Progress Notes (Signed)
   Subjective:    Patient ID: Jill Mcintyre, female    DOB: January 15, 1961, 56 y.o.   MRN: [id number]  HPI Here for several issues. First she has been sick for 3 weeks with sinus congestion, PND, ear pain, and a dry cough. No fever. When this started she was in [place] and she went to an Urgent Care there. They thought it was allergies and gave her a steroid shot, but this did not help. Second she has been dealing with a stressful situation at home and her Xanax does not help her the way it used to. She asks to try something else for situational anxiety.    Review of Systems  Constitutional: Negative.   HENT: Positive for congestion, postnasal drip, sinus pain, sinus pressure and sore throat.   Eyes: Negative.   Respiratory: Positive for cough.   Psychiatric/Behavioral: Negative for dysphoric mood. The patient is nervous/anxious.        Objective:   Physical Exam  Constitutional: She appears well-developed and well-nourished.  HENT:  Right Ear: External ear normal.  Left Ear: External ear normal.  Nose: Nose normal.  Mouth/Throat: Oropharynx is clear and moist.  Eyes: Conjunctivae are normal.  Neck: No thyromegaly present.  Pulmonary/Chest: Effort normal and breath sounds normal. No respiratory distress. She has no wheezes. She has no rales.  Lymphadenopathy:    She has no cervical adenopathy.  Psychiatric: She has a normal mood and affect. Her behavior is normal. Judgment and thought content normal.          Assessment & Plan:  Treat the sinusitis with a Zpack. For the anxiety, stop Xanax and try Ativan 1 mg prn.  Alysia Penna, MD

## 2017-04-26 ENCOUNTER — Ambulatory Visit: Payer: Self-pay | Admitting: Psychology

## 2017-05-10 ENCOUNTER — Ambulatory Visit: Payer: BLUE CROSS/BLUE SHIELD | Admitting: Psychology

## 2017-05-28 ENCOUNTER — Ambulatory Visit: Payer: BLUE CROSS/BLUE SHIELD | Admitting: Psychology

## 2017-05-28 DIAGNOSIS — F312 Bipolar disorder, current episode manic severe with psychotic features: Secondary | ICD-10-CM | POA: Diagnosis not present

## 2017-06-22 DIAGNOSIS — J189 Pneumonia, unspecified organism: Secondary | ICD-10-CM

## 2017-06-22 HISTORY — DX: Pneumonia, unspecified organism: J18.9

## 2017-07-15 ENCOUNTER — Ambulatory Visit: Payer: BLUE CROSS/BLUE SHIELD | Admitting: Family Medicine

## 2017-07-15 ENCOUNTER — Encounter: Payer: Self-pay | Admitting: Family Medicine

## 2017-07-15 VITALS — BP 140/80 | HR 88 | Temp 98.6°F | Ht 68.0 in | Wt 138.3 lb

## 2017-07-15 DIAGNOSIS — J069 Acute upper respiratory infection, unspecified: Secondary | ICD-10-CM

## 2017-07-15 DIAGNOSIS — R030 Elevated blood-pressure reading, without diagnosis of hypertension: Secondary | ICD-10-CM | POA: Diagnosis not present

## 2017-07-15 DIAGNOSIS — F3181 Bipolar II disorder: Secondary | ICD-10-CM | POA: Diagnosis not present

## 2017-07-15 MED ORDER — BENZONATATE 100 MG PO CAPS: 100.0000 mg | ORAL_CAPSULE | Freq: Two times a day (BID) | ORAL | 0 refills | Status: DC | PRN

## 2017-07-15 NOTE — Progress Notes (Signed)
HPI:  Acute visit for respiratory illness: -started: 4-5 days ago -symptoms:nasal congestion, sore throat, cough, PND -denies:fever, SOB, NVD, tooth pain -has tried: claritin D -sick contacts/travel/risks: no reported flu, strep or tick exposure -Hx of: denies hx asthma ROS: See pertinent positives and negatives per HPI.  Past Medical History:  Diagnosis Date  . Abnormal Pap smear of cervix 2013   ASCUS  . Acne    sees Dr. Jarome Matin for skin checks and acne  . Anemia   . Anxiety   . Bipolar 1 disorder ([place])   . Bipolar depression ([place])    sees Dr Gala Murdoch [facility] and sees Trey Paula for  therapy  . Constipation   . GERD (gastroesophageal reflux disease)   . Herpes simplex   . IBS (irritable bowel syndrome)   . Insomnia   . Migraines     Past Surgical History:  Procedure Laterality Date  . BASAL CELL CARCINOMA EXCISION     Dr Amy Martinique  . BREAST ENHANCEMENT SURGERY    . CESAREAN SECTION  1996  . COLPOSCOPY  10/07/11   CIN I  . ESOPHAGOGASTRODUODENOSCOPY  09/07/2006   Dr Ardis Hughs    Family History  Problem Relation Age of Onset  . Cancer Mother   . Alcohol abuse Unknown   . Arthritis Unknown   . Cancer Unknown        breast, ovrarian, uterine    Social History   Socioeconomic History  . Marital status: Married    Spouse name: None  . Number of children: None  . Years of education: None  . Highest education level: None  Social Needs  . Financial resource strain: None  . Food insecurity - worry: None  . Food insecurity - inability: None  . Transportation needs - medical: None  . Transportation needs - non-medical: None  Occupational History  . None  Tobacco Use  . Smoking status: Former Smoker    Start date: 10/07/1982  . Smokeless tobacco: Never Used  Substance and Sexual Activity  . Alcohol use: Yes    Alcohol/week: 0.0 oz    Comment: occ  . Drug use: No  . Sexual activity: Yes    Partners: Male    Birth  control/protection: Pill    Comment: partner vasectomy  Other Topics Concern  . None  Social History Narrative  . None     Current Outpatient Medications:  .  Biotin (BIOTIN 5000) 5 MG CAPS, Take 1,000 mg by mouth daily. Reported on 12/03/2015, Disp: , Rfl:  .  estradiol (VIVELLE-DOT) 0.075 MG/24HR, Place 1 patch onto the skin 2 (two) times a week., Disp: 24 patch, Rfl: 4 .  ibuprofen (ADVIL,MOTRIN) 800 MG tablet, TAKE 1 TABLET (800 MG TOTAL) BY MOUTH EVERY 8 (EIGHT) HOURS AS NEEDED FOR MODERATE PAIN., Disp: 60 tablet, Rfl: 5 .  lamoTRIgine (LAMICTAL) 100 MG tablet, Take 125 mg by mouth daily. , Disp: , Rfl:  .  LORazepam (ATIVAN) 1 MG tablet, Take 1 tablet (1 mg total) by mouth 2 (two) times daily as needed for anxiety., Disp: 60 tablet, Rfl: 2 .  meloxicam (MOBIC) 15 MG tablet, TAKE 1 TABLET BY MOUTH EVERY DAY AS NEEDED WITH FOOD, Disp: , Rfl: 4 .  mometasone (ELOCON) 0.1 % ointment, Apply a pea sized amount topically BID x 1-2 weeks, Disp: 15 g, Rfl: 0 .  Multiple Vitamin (MULTI-VITAMIN PO), Take by mouth daily., Disp: , Rfl:  .  progesterone (PROMETRIUM) 200  MG capsule, Take 1 tablet daily days 1-15 of each month, Disp: 45 capsule, Rfl: 4 .  sertraline (ZOLOFT) 50 MG tablet, Take 50 mg by mouth daily., Disp: , Rfl: 2 .  temazepam (RESTORIL) 15 MG capsule, Take 1 capsule (15 mg total) by mouth at bedtime as needed., Disp: 30 capsule, Rfl: 2 .  traMADol (ULTRAM) 50 MG tablet, TAKE 1/2 TABLET BY MOUTH EVERY 8 HRS AS NEEDED** ONLY 30 DAYS SUPPLY**, Disp: 60 tablet, Rfl: 5 .  traZODone (DESYREL) 50 MG tablet, TAKE 1-2 TABLET AT BEDTIME AS NEEDED FOR SLEEP, Disp: , Rfl: 2 .  benzonatate (TESSALON PERLES) 100 MG capsule, Take 1 capsule (100 mg total) by mouth 2 (two) times daily as needed., Disp: 20 capsule, Rfl: 0  EXAM:  Vitals:   07/15/17 1559  BP: 140/80  Pulse: 88  Temp: 98.6 F (37 C)    Body mass index is 21.03 kg/m.  GENERAL: vitals reviewed and listed above, alert,  oriented, appears well hydrated and in no acute distress  HEENT: atraumatic, conjunttiva clear, no obvious abnormalities on inspection of external nose and ears, normal appearance of ear canals and TMs, clear nasal congestion, mild post oropharyngeal erythema with PND, no tonsillar edema or exudate, no sinus TTP  NECK: no obvious masses on inspection  LUNGS: clear to auscultation bilaterally, no wheezes, rales or rhonchi, good air movement  CV: HRRR, no peripheral edema  MS: moves all extremities without noticeable abnormality  PSYCH: pleasant and cooperative, no obvious depression or anxiety  ASSESSMENT AND PLAN:  Discussed the following assessment and plan:  Viral upper respiratory illness  Elevated blood pressure reading  -given HPI and exam findings today, a serious infection or illness is unlikely. We discussed potential etiologies, with VURI being most likely, and advised supportive care and monitoring. We discussed treatment side effects, likely course, antibiotic misuse, transmission, and signs of developing a serious illness. -Tessalon for cough rx after discussion risks and proper use -advised stop D products and recheck BP with PCP in 2-4 weeks -of course, we advised to return or notify a doctor immediately if symptoms worsen or persist or new concerns arise.    Patient Instructions  INSTRUCTIONS FOR UPPER RESPIRATORY INFECTION:  -schedule follow up in 2-4 weeks with your doctor to check blood pressure  -plenty of rest and fluids  -nasal saline wash 2-3 times daily (use prepackaged nasal saline or bottled/distilled water if making your own)   -can use AFRIN nasal spray for drainage and nasal congestion - but do NOT use longer then 3-4 days  -can use tylenol (in no history of liver disease) or ibuprofen (if no history of kidney disease, bowel bleeding or significant heart disease) as directed for aches and sorethroat  -in the winter time, using a humidifier at  night is helpful (please follow cleaning instructions)  -if you are taking a cough medication - use only as directed, may also try a teaspoon of honey to coat the throat and throat lozenges.   -for sore throat, salt water gargles can help  -follow up if you have fevers, facial pain, tooth pain, difficulty breathing or are worsening or symptoms persist longer then expected  Upper Respiratory Infection, Adult An upper respiratory infection (URI) is also known as the common cold. It is often caused by a type of germ (virus). Colds are easily spread (contagious). You can pass it to others by kissing, coughing, sneezing, or drinking out of the same glass. Usually, you get better in  1 to 3  weeks.  However, the cough can last for even longer. HOME CARE   Only take medicine as told by your doctor. Follow instructions provided above.  Drink enough water and fluids to keep your pee (urine) clear or pale yellow.  Get plenty of rest.  Return to work when your temperature is < 100 for 24 hours or as told by your doctor. You may use a face mask and wash your hands to stop your cold from spreading. GET HELP RIGHT AWAY IF:   After the first few days, you feel you are getting worse.  You have questions about your medicine.  You have chills, shortness of breath, or red spit (mucus).  You have pain in the face for more then 1-2 days, especially when you bend forward.  You have a fever, puffy (swollen) neck, pain when you swallow, or white spots in the back of your throat.  You have a bad headache, ear pain, sinus pain, or chest pain.  You have a high-pitched whistling sound when you breathe in and out (wheezing).  You cough up blood.  You have sore muscles or a stiff neck. MAKE SURE YOU:   Understand these instructions.  Will watch your condition.  Will get help right away if you are not doing well or get worse. Document Released: 11/25/2007 Document Revised: 08/31/2011 Document Reviewed:  09/13/2013 [facility] Patient Information 2015 [place], [place]. This information is not intended to replace advice given to you by your health care provider. Make sure you discuss any questions you have with your health care provider.    Lucretia Kern, DO

## 2017-07-15 NOTE — Patient Instructions (Addendum)
INSTRUCTIONS FOR UPPER RESPIRATORY INFECTION:  -schedule follow up in 2-4 weeks with your doctor to check blood pressure  -plenty of rest and fluids  -nasal saline wash 2-3 times daily (use prepackaged nasal saline or bottled/distilled water if making your own)   -can use AFRIN nasal spray for drainage and nasal congestion - but do NOT use longer then 3-4 days  -can use tylenol (in no history of liver disease) or ibuprofen (if no history of kidney disease, bowel bleeding or significant heart disease) as directed for aches and sorethroat  -in the winter time, using a humidifier at night is helpful (please follow cleaning instructions)  -if you are taking a cough medication - use only as directed, may also try a teaspoon of honey to coat the throat and throat lozenges.   -for sore throat, salt water gargles can help  -follow up if you have fevers, facial pain, tooth pain, difficulty breathing or are worsening or symptoms persist longer then expected  Upper Respiratory Infection, Adult An upper respiratory infection (URI) is also known as the common cold. It is often caused by a type of germ (virus). Colds are easily spread (contagious). You can pass it to others by kissing, coughing, sneezing, or drinking out of the same glass. Usually, you get better in 1 to 3  weeks.  However, the cough can last for even longer. HOME CARE   Only take medicine as told by your doctor. Follow instructions provided above.  Drink enough water and fluids to keep your pee (urine) clear or pale yellow.  Get plenty of rest.  Return to work when your temperature is < 100 for 24 hours or as told by your doctor. You may use a face mask and wash your hands to stop your cold from spreading. GET HELP RIGHT AWAY IF:   After the first few days, you feel you are getting worse.  You have questions about your medicine.  You have chills, shortness of breath, or red spit (mucus).  You have pain in the face for more  then 1-2 days, especially when you bend forward.  You have a fever, puffy (swollen) neck, pain when you swallow, or white spots in the back of your throat.  You have a bad headache, ear pain, sinus pain, or chest pain.  You have a high-pitched whistling sound when you breathe in and out (wheezing).  You cough up blood.  You have sore muscles or a stiff neck. MAKE SURE YOU:   Understand these instructions.  Will watch your condition.  Will get help right away if you are not doing well or get worse. Document Released: 11/25/2007 Document Revised: 08/31/2011 Document Reviewed: 09/13/2013 [facility] Patient Information 2015 [employer], [place]. This information is not intended to replace advice given to you by your health care provider. Make sure you discuss any questions you have with your health care provider.

## 2017-07-15 NOTE — Addendum Note (Signed)
Addended by: Agnes Lawrence on: 07/15/2017 04:21 PM   Modules accepted: Orders

## 2017-07-19 DIAGNOSIS — G4733 Obstructive sleep apnea (adult) (pediatric): Secondary | ICD-10-CM | POA: Diagnosis not present

## 2017-07-28 DIAGNOSIS — G4733 Obstructive sleep apnea (adult) (pediatric): Secondary | ICD-10-CM | POA: Diagnosis not present

## 2017-08-02 ENCOUNTER — Ambulatory Visit: Payer: BLUE CROSS/BLUE SHIELD | Admitting: Psychology

## 2017-08-03 ENCOUNTER — Ambulatory Visit (INDEPENDENT_AMBULATORY_CARE_PROVIDER_SITE_OTHER): Payer: BLUE CROSS/BLUE SHIELD | Admitting: Psychology

## 2017-08-03 DIAGNOSIS — F312 Bipolar disorder, current episode manic severe with psychotic features: Secondary | ICD-10-CM

## 2017-08-04 ENCOUNTER — Telehealth: Payer: Self-pay

## 2017-08-04 NOTE — Telephone Encounter (Signed)
[facility]  Requesting an alternative request for lorazepam 1 MG tablet   pts INS no longer covers this medication, please send an alternative of Diazepam per INS   Sent to PCP

## 2017-08-05 NOTE — Telephone Encounter (Signed)
Stop the Lorazepam. Call in Diazepam 5 mg bid prn anxiety, #180 with one rf

## 2017-08-07 DIAGNOSIS — J029 Acute pharyngitis, unspecified: Secondary | ICD-10-CM | POA: Diagnosis not present

## 2017-08-07 DIAGNOSIS — J209 Acute bronchitis, unspecified: Secondary | ICD-10-CM | POA: Diagnosis not present

## 2017-08-07 DIAGNOSIS — R05 Cough: Secondary | ICD-10-CM | POA: Diagnosis not present

## 2017-08-07 DIAGNOSIS — R509 Fever, unspecified: Secondary | ICD-10-CM | POA: Diagnosis not present

## 2017-08-09 ENCOUNTER — Other Ambulatory Visit: Payer: Self-pay | Admitting: Family Medicine

## 2017-08-09 MED ORDER — DIAZEPAM 5 MG PO TABS: 5.0000 mg | ORAL_TABLET | Freq: Two times a day (BID) | ORAL | 1 refills | Status: DC | PRN

## 2017-08-09 NOTE — Telephone Encounter (Signed)
Rx printed, awaiting MD signature.   Pt was called and made aware of medication change.

## 2017-08-10 ENCOUNTER — Telehealth: Payer: Self-pay | Admitting: Family Medicine

## 2017-08-10 NOTE — Telephone Encounter (Signed)
[facility], pt picked up Ativan # 60 on 08/06/2017, just dropped off script for Valium to be filled. Pharmacy needs clarification on scripts? If a script has been discontinued then we would remove from current medication list and call pharmacy to cancel any remaining refills.

## 2017-08-10 NOTE — Telephone Encounter (Signed)
Rx signed and faxed to the pharmacy.

## 2017-08-11 ENCOUNTER — Ambulatory Visit: Payer: BLUE CROSS/BLUE SHIELD | Admitting: Psychology

## 2017-08-11 NOTE — Telephone Encounter (Signed)
[facility] and spoke with Denny Peon she stated that they need clarification on whether the pt is OK to take both the Ativan and the valium or should she just be taking Ativan ONLY or Valium ONLY? If she should only be taking 1 Rx please call pharmacy to advise.    Sent to PCP to advise what pt should be taking

## 2017-08-11 NOTE — Telephone Encounter (Signed)
We recently stopped the Ativan and switched to Valium. Fill the Valium only, and remove the Ativan from her med list

## 2017-08-12 NOTE — Telephone Encounter (Signed)
Called and spoke with [facility]. Pharmacy has been advise. Removed ativan from pt's med list.

## 2017-08-17 ENCOUNTER — Telehealth: Payer: Self-pay | Admitting: Family Medicine

## 2017-08-17 ENCOUNTER — Ambulatory Visit: Payer: BLUE CROSS/BLUE SHIELD | Admitting: Psychology

## 2017-08-17 DIAGNOSIS — F312 Bipolar disorder, current episode manic severe with psychotic features: Secondary | ICD-10-CM | POA: Diagnosis not present

## 2017-08-17 NOTE — Telephone Encounter (Signed)
Refill request for Tramadol

## 2017-08-17 NOTE — Telephone Encounter (Signed)
Last OV 04/21/2017   Last refilled Rx last refilled 11/18/2016 disp 60 with 5 refills   Sent to PCP for approval

## 2017-08-18 ENCOUNTER — Telehealth: Payer: Self-pay | Admitting: Obstetrics & Gynecology

## 2017-08-18 MED ORDER — TRAMADOL HCL 50 MG PO TABS: ORAL_TABLET | ORAL | 5 refills | Status: DC

## 2017-08-18 NOTE — Telephone Encounter (Signed)
Call in #[phone] with 5 rf 

## 2017-08-18 NOTE — Telephone Encounter (Signed)
Patient called and left a message on our answering machine. She reported "a bit of spotting" and requested an appointment. Routing to triage for further assessment and scheduling.  Last seen: 09/03/16

## 2017-08-18 NOTE — Telephone Encounter (Signed)
Spoke with patient. Patient states that she has had intermittent light vaginal spotting for 3 days. Denies any pain or heavy bleeding. Is taking Progesterone days 1-15 of the month. Took two extra doses this month on accident. Is changing her Estradiol patch twice weekly. Patient is over due for her annual and would like to schedule and to discuss spotting. Appointment scheduled for 08/24/2017 at 10 am with Dr.Miller. Patient is agreeable. Aware if she has any new or worsening symptoms will need to be seen earlier for evaluation.  Routing to provider for final review. Patient agreeable to disposition. Will close encounter.

## 2017-08-18 NOTE — Telephone Encounter (Signed)
Medication phoned to pharmacy as requested.

## 2017-08-24 ENCOUNTER — Encounter: Payer: Self-pay | Admitting: Obstetrics & Gynecology

## 2017-08-24 ENCOUNTER — Ambulatory Visit: Payer: BLUE CROSS/BLUE SHIELD | Admitting: Obstetrics & Gynecology

## 2017-08-24 ENCOUNTER — Ambulatory Visit (INDEPENDENT_AMBULATORY_CARE_PROVIDER_SITE_OTHER): Payer: BLUE CROSS/BLUE SHIELD | Admitting: Psychology

## 2017-08-24 ENCOUNTER — Other Ambulatory Visit: Payer: Self-pay

## 2017-08-24 VITALS — BP 110/60 | HR 72 | Resp 14 | Ht 68.5 in | Wt 138.0 lb

## 2017-08-24 DIAGNOSIS — Z0000 Encounter for general adult medical examination without abnormal findings: Secondary | ICD-10-CM | POA: Diagnosis not present

## 2017-08-24 DIAGNOSIS — Z01419 Encounter for gynecological examination (general) (routine) without abnormal findings: Secondary | ICD-10-CM

## 2017-08-24 DIAGNOSIS — F312 Bipolar disorder, current episode manic severe with psychotic features: Secondary | ICD-10-CM

## 2017-08-24 DIAGNOSIS — Z9229 Personal history of other drug therapy: Secondary | ICD-10-CM

## 2017-08-24 MED ORDER — PROGESTERONE MICRONIZED 200 MG PO CAPS: ORAL_CAPSULE | ORAL | 4 refills | Status: DC

## 2017-08-24 MED ORDER — ESTRADIOL 0.075 MG/24HR TD PTTW: 1.0000 | MEDICATED_PATCH | TRANSDERMAL | 4 refills | Status: DC

## 2017-08-24 NOTE — Progress Notes (Signed)
57 y.o. [id number] MarriedCaucasianF here for annual exam.  Separating from her husband.  She is looking for a town house right now.  Has been experiencing a lot of stressors.  Seeing therapist weekly.  Had some suicidal ideations earlier this year.    Denies vaginal bleeding.     Patient's last menstrual period was 09/14/2014.          Sexually active: Yes.    The current method of family planning is post menopausal status.    Exercising: Yes.    walking, treadmill Smoker:  no  Health Maintenance: Pap:  03/27/15 neg. HR HPV:neg   10/16/13 Neg. HR HPV:neg   History of abnormal Pap:  Yes, h/o ASCUS, HR HPV+ MMG:  04/29/16 BIRADS1:Neg Colonoscopy:  2015 f/u 5 years  BMD:   09/2005 Normal  TDaP:  2015 Pneumonia vaccine(s):  Done  Shingrix:   Hep C testing: 07/06/16 Neg  Screening Labs: fasting labs today   reports that she has quit smoking. She started smoking about 34 years ago. she has never used smokeless tobacco. She reports that she drinks alcohol. She reports that she does not use drugs.  Past Medical History:  Diagnosis Date  . Abnormal Pap smear of cervix 2013   ASCUS  . Acne    sees Dr. Jarome Matin for skin checks and acne  . Anemia   . Anxiety   . Bipolar 1 disorder ([place])   . Bipolar depression ([place])    sees Dr Gala Murdoch [facility] and sees Trey Paula for  therapy  . Constipation   . GERD (gastroesophageal reflux disease)   . Herpes simplex   . IBS (irritable bowel syndrome)   . Insomnia   . Migraines   . Pneumonia 2019    Past Surgical History:  Procedure Laterality Date  . BASAL CELL CARCINOMA EXCISION     Dr Amy Martinique  . BREAST ENHANCEMENT SURGERY    . CESAREAN SECTION  1996  . COLPOSCOPY  10/07/11   CIN I  . ESOPHAGOGASTRODUODENOSCOPY  09/07/2006   Dr Ardis Hughs    Current Outpatient Medications  Medication Sig Dispense Refill  . Biotin (BIOTIN 5000) 5 MG CAPS Take 1,000 mg by mouth daily. Reported on 12/03/2015    . diazepam (VALIUM) 5 MG  tablet Take 1 tablet (5 mg total) by mouth 2 (two) times daily as needed for anxiety. 180 tablet 1  . estradiol (VIVELLE-DOT) 0.075 MG/24HR Place 1 patch onto the skin 2 (two) times a week. 24 patch 4  . ibuprofen (ADVIL,MOTRIN) 800 MG tablet TAKE 1 TABLET (800 MG TOTAL) BY MOUTH EVERY 8 (EIGHT) HOURS AS NEEDED FOR MODERATE PAIN. 60 tablet 5  . lamoTRIgine (LAMICTAL) 100 MG tablet Take 150 mg by mouth daily.     . mometasone (ELOCON) 0.1 % ointment Apply a pea sized amount topically BID x 1-2 weeks 15 g 0  . Multiple Vitamin (MULTI-VITAMIN PO) Take by mouth daily.    . progesterone (PROMETRIUM) 200 MG capsule Take 1 tablet daily days 1-15 of each month 45 capsule 4  . sertraline (ZOLOFT) 50 MG tablet Take 50 mg by mouth daily.  2  . traMADol (ULTRAM) 50 MG tablet TAKE 1/2 TABLET BY MOUTH EVERY 8 HRS AS NEEDED 60 tablet 5  . traZODone (DESYREL) 50 MG tablet TAKE 1-2 TABLET AT BEDTIME AS NEEDED FOR SLEEP  2   No current facility-administered medications for this visit.     Family History  Problem Relation  Age of Onset  . Cancer Mother   . Alcohol abuse Unknown   . Arthritis Unknown   . Cancer Unknown        breast, ovrarian, uterine    ROS:  Pertinent items are noted in HPI.  Otherwise, a comprehensive ROS was negative.  Exam:   BP 110/60 (BP Location: Right Arm, Patient Position: Sitting, Cuff Size: Large)   Pulse 72   Resp 14   Ht 5' 8.5" (1.74 m)   Wt 138 lb (62.6 kg)   LMP 09/14/2014   BMI 20.68 kg/m   Weight change: -4#    Height: 5' 8.5" (174 cm)  Ht Readings from Last 3 Encounters:  08/24/17 5' 8.5" (1.74 m)  07/15/17 5' 8"  (1.727 m)  04/21/17 5' 8"  (1.727 m)    General appearance: alert, cooperative and appears stated age Head: Normocephalic, without obvious abnormality, atraumatic Neck: no adenopathy, supple, symmetrical, trachea midline and thyroid normal to inspection and palpation Lungs: clear to auscultation bilaterally Breasts: normal appearance, no masses or  tenderness, bilateral implants present Heart: regular rate and rhythm Abdomen: soft, non-tender; bowel sounds normal; no masses,  no organomegaly Extremities: extremities normal, atraumatic, no cyanosis or edema Skin: Skin color, texture, turgor normal. No rashes or lesions Lymph nodes: Cervical, supraclavicular, and axillary nodes normal. No abnormal inguinal nodes palpated Neurologic: Grossly normal   Pelvic: External genitalia:  no lesions              Urethra:  normal appearing urethra with no masses, tenderness or lesions              Bartholins and Skenes: normal                 Vagina: normal appearing vagina with normal color and discharge, no lesions              Cervix: no lesions              Pap taken: No. Bimanual Exam:  Uterus:  normal size, contour, position, consistency, mobility, non-tender              Adnexa: normal adnexa and no mass, fullness, tenderness               Rectovaginal: Confirms               Anus:  normal sphincter tone, no lesions  Chaperone was present for exam.  A:  Well Woman with normal exam PMP, on HRT H/O bipolar d/o H/O CIN with neg pap and neg HR HPV 2015 and 2016 H/O HSV  P:   Mammogram guidelines reviewed.  Pt aware due. pap smear not obtained.  Will repeat at next AEX RF for estradiol patch 0.031m twice weekly.  #24/4RF and Prometrium 2080mdaily 1-5 each month.  #45/4RF. TSh, Vit D, Lipids, CMP, CBC obtained today  Progesterone, estradiol and FSH also obtained today return annually or prn

## 2017-08-25 LAB — LIPID PANEL
Chol/HDL Ratio: 2.6 ratio (ref 0.0–4.4)
Cholesterol, Total: 228 mg/dL — ABNORMAL HIGH (ref 100–199)
HDL: 87 mg/dL (ref >39)
LDL Calculated: 127 mg/dL — ABNORMAL HIGH (ref 0–99)
Triglycerides: 70 mg/dL (ref 0–149)
VLDL Cholesterol Cal: 14 mg/dL (ref 5–40)

## 2017-08-25 LAB — COMPREHENSIVE METABOLIC PANEL
ALT: 17 IU/L (ref 0–32)
AST: 20 IU/L (ref 0–40)
Albumin/Globulin Ratio: 1.9 (ref 1.2–2.2)
Albumin: 4.9 g/dL (ref 3.5–5.5)
Alkaline Phosphatase: 57 IU/L (ref 39–117)
BUN/Creatinine Ratio: 20 (ref 9–23)
BUN: 16 mg/dL (ref 6–24)
Bilirubin Total: 0.3 mg/dL (ref 0.0–1.2)
CO2: 27 mmol/L (ref 20–29)
Calcium: 9.4 mg/dL (ref 8.7–10.2)
Chloride: 102 mmol/L (ref 96–106)
Creatinine, Ser: 0.8 mg/dL (ref 0.57–1.00)
GFR calc Af Amer: 97 mL/min/{1.73_m2} (ref >59)
GFR calc non Af Amer: 84 mL/min/{1.73_m2} (ref >59)
Globulin, Total: 2.6 g/dL (ref 1.5–4.5)
Glucose: 98 mg/dL (ref 65–99)
Potassium: 4.2 mmol/L (ref 3.5–5.2)
Sodium: 142 mmol/L (ref 134–144)
Total Protein: 7.5 g/dL (ref 6.0–8.5)

## 2017-08-25 LAB — VITAMIN D 25 HYDROXY (VIT D DEFICIENCY, FRACTURES): Vit D, 25-Hydroxy: 42.6 ng/mL (ref 30.0–100.0)

## 2017-08-25 LAB — CBC
HEMATOCRIT: 43 % (ref 34.0–46.6)
Hemoglobin: 14.4 g/dL (ref 11.1–15.9)
MCH: 31 pg (ref 26.6–33.0)
MCHC: 33.5 g/dL (ref 31.5–35.7)
MCV: 93 fL (ref 79–97)
PLATELETS: 235 10*3/uL (ref 150–379)
RBC: 4.64 x10E6/uL (ref 3.77–5.28)
RDW: 13 % (ref 12.3–15.4)
WBC: 4.6 10*3/uL (ref 3.4–10.8)

## 2017-08-25 LAB — PROLACTIN: Prolactin: 9.9 ng/mL (ref 4.8–23.3)

## 2017-08-25 LAB — ESTRADIOL: ESTRADIOL: 11 pg/mL

## 2017-08-25 LAB — TSH: TSH: 1.25 u[IU]/mL (ref 0.450–4.500)

## 2017-08-25 LAB — FOLLICLE STIMULATING HORMONE: FSH: 78.9 m[IU]/mL

## 2017-09-02 ENCOUNTER — Ambulatory Visit (INDEPENDENT_AMBULATORY_CARE_PROVIDER_SITE_OTHER): Payer: BLUE CROSS/BLUE SHIELD | Admitting: Psychology

## 2017-09-02 DIAGNOSIS — F312 Bipolar disorder, current episode manic severe with psychotic features: Secondary | ICD-10-CM | POA: Diagnosis not present

## 2017-09-08 ENCOUNTER — Ambulatory Visit: Payer: BLUE CROSS/BLUE SHIELD | Admitting: Psychology

## 2017-09-09 DIAGNOSIS — R05 Cough: Secondary | ICD-10-CM | POA: Diagnosis not present

## 2017-09-09 DIAGNOSIS — J3089 Other allergic rhinitis: Secondary | ICD-10-CM | POA: Diagnosis not present

## 2017-09-10 ENCOUNTER — Ambulatory Visit (INDEPENDENT_AMBULATORY_CARE_PROVIDER_SITE_OTHER): Payer: BLUE CROSS/BLUE SHIELD | Admitting: Psychology

## 2017-09-10 DIAGNOSIS — F312 Bipolar disorder, current episode manic severe with psychotic features: Secondary | ICD-10-CM | POA: Diagnosis not present

## 2017-09-24 NOTE — Telephone Encounter (Signed)
Rx signed and faxed to pharmacy. No further action needed.

## 2017-10-01 ENCOUNTER — Other Ambulatory Visit: Payer: Self-pay | Admitting: Obstetrics & Gynecology

## 2017-10-04 ENCOUNTER — Ambulatory Visit: Payer: BLUE CROSS/BLUE SHIELD | Admitting: Psychology

## 2017-10-04 DIAGNOSIS — F312 Bipolar disorder, current episode manic severe with psychotic features: Secondary | ICD-10-CM | POA: Diagnosis not present

## 2017-10-13 DIAGNOSIS — F3181 Bipolar II disorder: Secondary | ICD-10-CM | POA: Diagnosis not present

## 2017-10-20 ENCOUNTER — Encounter

## 2017-10-26 ENCOUNTER — Ambulatory Visit: Payer: BLUE CROSS/BLUE SHIELD | Admitting: Psychology

## 2017-10-26 DIAGNOSIS — F312 Bipolar disorder, current episode manic severe with psychotic features: Secondary | ICD-10-CM | POA: Diagnosis not present

## 2017-10-28 ENCOUNTER — Ambulatory Visit (INDEPENDENT_AMBULATORY_CARE_PROVIDER_SITE_OTHER): Payer: BLUE CROSS/BLUE SHIELD | Admitting: Family Medicine

## 2017-10-28 ENCOUNTER — Encounter: Payer: Self-pay | Admitting: Family Medicine

## 2017-10-28 VITALS — BP 116/68 | HR 70 | Temp 98.4°F | Ht 68.5 in | Wt 136.6 lb

## 2017-10-28 DIAGNOSIS — Z0000 Encounter for general adult medical examination without abnormal findings: Secondary | ICD-10-CM

## 2017-10-28 LAB — LIPID PANEL
CHOL/HDL RATIO: 3
Cholesterol: 188 mg/dL (ref 0–200)
HDL: 62.9 mg/dL (ref >39.00)
LDL Cholesterol: 110 mg/dL — ABNORMAL HIGH (ref 0–99)
NONHDL: 124.98
Triglycerides: 73 mg/dL (ref 0.0–149.0)
VLDL: 14.6 mg/dL (ref 0.0–40.0)

## 2017-10-28 NOTE — Progress Notes (Signed)
   Subjective:    Patient ID: Jill Mcintyre, female    DOB: 1960/08/13, 57 y.o.   MRN: [id number]  HPI Here for a well exam. She feels great. She left her husband several months ago and she struggled emotionally for awhile. Now she feels much happier and is more confident. She is interviewing for a new job.    Review of Systems  Constitutional: Negative.   HENT: Negative.   Eyes: Negative.   Respiratory: Negative.   Cardiovascular: Negative.   Gastrointestinal: Negative.   Genitourinary: Negative for decreased urine volume, difficulty urinating, dyspareunia, dysuria, enuresis, flank pain, frequency, hematuria, pelvic pain and urgency.  Musculoskeletal: Negative.   Skin: Negative.   Neurological: Negative.   Psychiatric/Behavioral: Negative.        Objective:   Physical Exam  Constitutional: She is oriented to person, place, and time. She appears well-developed and well-nourished. No distress.  HENT:  Head: Normocephalic and atraumatic.  Right Ear: External ear normal.  Left Ear: External ear normal.  Nose: Nose normal.  Mouth/Throat: Oropharynx is clear and moist. No oropharyngeal exudate.  Eyes: Pupils are equal, round, and reactive to light. Conjunctivae and EOM are normal. No scleral icterus.  Neck: Normal range of motion. Neck supple. No JVD present. No thyromegaly present.  Cardiovascular: Normal rate, regular rhythm, normal heart sounds and intact distal pulses. Exam reveals no gallop and no friction rub.  No murmur heard. Pulmonary/Chest: Effort normal and breath sounds normal. No respiratory distress. She has no wheezes. She has no rales. She exhibits no tenderness.  Abdominal: Soft. Bowel sounds are normal. She exhibits no distension and no mass. There is no tenderness. There is no rebound and no guarding.  Musculoskeletal: Normal range of motion. She exhibits no edema or tenderness.  Lymphadenopathy:    She has no cervical adenopathy.  Neurological: She is  alert and oriented to person, place, and time. She has normal reflexes. She displays normal reflexes. No cranial nerve deficit. She exhibits normal muscle tone. Coordination normal.  Skin: Skin is warm and dry. No rash noted. No erythema.  Psychiatric: She has a normal mood and affect. Her behavior is normal. Judgment and thought content normal.          Assessment & Plan:  Well exam. We discussed diet and exercise. She had complete labs in March per her GYN and these were normal except for an elevated LDL. We will repeat a fasting lipid panel today. Jill Penna, MD

## 2017-11-11 DIAGNOSIS — F3181 Bipolar II disorder: Secondary | ICD-10-CM | POA: Diagnosis not present

## 2017-11-18 ENCOUNTER — Ambulatory Visit: Payer: Self-pay

## 2017-11-18 ENCOUNTER — Encounter: Payer: Self-pay | Admitting: Sports Medicine

## 2017-11-18 ENCOUNTER — Ambulatory Visit: Payer: BLUE CROSS/BLUE SHIELD | Admitting: Sports Medicine

## 2017-11-18 VITALS — BP 124/80 | HR 82 | Ht 68.5 in | Wt 138.8 lb

## 2017-11-18 DIAGNOSIS — M9908 Segmental and somatic dysfunction of rib cage: Secondary | ICD-10-CM

## 2017-11-18 DIAGNOSIS — M19019 Primary osteoarthritis, unspecified shoulder: Secondary | ICD-10-CM | POA: Diagnosis not present

## 2017-11-18 DIAGNOSIS — G2589 Other specified extrapyramidal and movement disorders: Secondary | ICD-10-CM

## 2017-11-18 DIAGNOSIS — M25511 Pain in right shoulder: Secondary | ICD-10-CM

## 2017-11-18 NOTE — Progress Notes (Signed)
Jill Mcintyre. Jill Mcintyre, [place] [facility] [place]  Jill Zarazua Mcintyre - 57 y.o. female MRN [id number]  Date of birth: [birth year]  Visit Date: 11/18/2017  PCP: Laurey Morale, MD   Referred by: Laurey Morale, MD  Scribe for today's visit: Josepha Pigg, CMA     SUBJECTIVE:  Jill Mcintyre is here for Initial Assessment (R shoulder pain)  Her R shoulder pain symptoms INITIALLY: Began about 1 month ago and MOI is unknown.  Described as mild but can be severe sharp pain, radiating to the R arm but not below the elbow.  Worsened with sleeping on the R shoulder, shrugging shoulders, lifting.  Improved with rest. Additional associated symptoms include: She c/o decreased ROM, denies clicking and popping. Pain is worse when reaching down and back.     At this time symptoms are worsening compared to onset.  She has been taking IBU 800 mg with some relief. She has tried using Lidocaine patch and Biofreeze with temporary relief.   ROS Reports night time disturbances. Denies fevers, chills, or night sweats. Denies unexplained weight loss. Denies personal history of cancer. Denies changes in bowel or bladder habits. Denies recent unreported falls. Denies new or worsening dyspnea or wheezing. Denies headaches or dizziness.  Reports numbness, tingling or weakness  In the extremities.  Denies dizziness or presyncopal episodes Denies lower extremity edema    HISTORY & PERTINENT PRIOR DATA:  Prior History reviewed and updated per electronic medical record.  Significant/pertinent history, findings, studies include:  reports that she has quit smoking. She started smoking about 35 years ago. She has never used smokeless tobacco. No results for input(s): HGBA1C, LABURIC, CREATINE in the last 8760 hours. No specialty comments available. No problems updated.  OBJECTIVE:  VS:  HT:5' 8.5" (174 cm)   WT:138 lb 12.8 oz (63 kg)   BMI:20.8    BP:124/80  HR:82bpm  TEMP: ( )  RESP:97 %   PHYSICAL EXAM: Constitutional: WDWN, Non-toxic appearing. Psychiatric: Alert & appropriately interactive.  Not depressed or anxious appearing. Respiratory: No increased work of breathing.  Trachea Midline Eyes: Pupils are equal.  EOM intact without nystagmus.  No scleral icterus  Vascular Exam: warm to touch no edema  upper extremity neuro exam: unremarkable normal strength normal sensation  MSK Exam: Shoulders overall well aligned.  She has a palpable nodule over the [facility] joint of the right shoulder.  There is a prominence of the left [place] joint and this is nonpainful.  She has full overhead range of motion of the shoulder.  Empty can testing, speeds testing, O'Brien's testing are normal painful but strength is preserved.  She does have scapular dyskinesis with overhead motion is slightly protracted shoulder at baseline.   ASSESSMENT & PLAN:   1. 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  Ultrasound-guided injection per procedure note.  Avoid overhead exacerbating activities and working on scapular stabilization will be beneficial.  Discussed the foundation of treatment for this condition is physical therapy and/or daily (5-6 days/week) therapeutic exercises, focusing on core strengthening, coordination, neuromuscular control/reeducation.  Therapeutic exercises prescribed per procedure note.  Follow-up: Return in about 6 weeks (around 12/30/2017).      Please see additional documentation for Objective, Assessment and Plan sections. Pertinent additional documentation may be included in corresponding procedure notes, imaging studies, problem based documentation and patient instructions. Please see these  sections of the encounter for additional information regarding this visit.  CMA/ATC served as Education administrator during this visit. History, Physical, and Plan performed by  medical provider. Documentation and orders reviewed and attested to.      Jill Mcintyre, [place] [facility] Physician

## 2017-11-18 NOTE — Patient Instructions (Addendum)

## 2017-11-18 NOTE — Progress Notes (Signed)

## 2017-11-18 NOTE — Procedures (Signed)
PROCEDURE NOTE:  Ultrasound Guided: Injection: Right shoulder -AC joint Images were obtained and interpreted by myself, Teresa Coombs, DO  Images have been saved and stored to PACS system. Images obtained on: GE S7 Ultrasound machine    ULTRASOUND FINDINGS:  AC joint arthropathy with a positive mushroom sign   DESCRIPTION OF PROCEDURE:  The patient's clinical condition is marked by substantial pain and/or significant functional disability. Other conservative therapy has not provided relief, is contraindicated, or not appropriate. There is a reasonable likelihood that injection will significantly improve the patient's pain and/or functional impairment.   After discussing the risks, benefits and expected outcomes of the injection and all questions were reviewed and answered, the patient wished to undergo the above named procedure.  Verbal consent was obtained.  The ultrasound was used to identify the target structure and adjacent neurovascular structures. The skin was then prepped in sterile fashion and the target structure was injected under direct visualization using sterile technique as below:  PREP: Alcohol and Ethel Chloride  APPROACH: direct, single injection, 25g 1.5 in. INJECTATE: 0.5 cc 0.5% Marcaine and 0.5 cc 66m/mL DepoMedrol ASPIRATE: None DRESSING: Band-Aid  Post procedural instructions including recommending icing and warning signs for infection were reviewed.    This procedure was well tolerated and there were no complications.   IMPRESSION: Succesful Ultrasound Guided: Injection

## 2017-11-18 NOTE — Progress Notes (Signed)
PROCEDURE NOTE : OSTEOPATHIC MANIPULATION The decision today to treat with Osteopathic Manipulative Therapy (OMT) was based on physical exam findings. Verbal consent was obtained following a discussion with the patient regarding the of risks, benefits and potential side effects, including an acute pain flare,post manipulation soreness and need for repeat treatments.     Contraindications to OMT reviewed and include: NONE  Manipulation was performed as below: Regions treated: Ribs OMT Techniques Used: HVLA, muscle energy and myofascial release  The patient tolerated the treatment well and reported Improved symptoms following treatment today. Patient was given medications, exercises, stretches and lifestyle modifications per AVS and verbally.   OSTEOPATHIC/STRUCTURAL EXAM:   Rib 3 Right  Posterior

## 2017-11-24 ENCOUNTER — Ambulatory Visit: Payer: BLUE CROSS/BLUE SHIELD | Admitting: Psychology

## 2017-11-26 ENCOUNTER — Ambulatory Visit: Payer: BLUE CROSS/BLUE SHIELD | Admitting: Psychology

## 2017-12-03 ENCOUNTER — Ambulatory Visit: Payer: BLUE CROSS/BLUE SHIELD | Admitting: Psychology

## 2017-12-03 DIAGNOSIS — F312 Bipolar disorder, current episode manic severe with psychotic features: Secondary | ICD-10-CM | POA: Diagnosis not present

## 2017-12-16 ENCOUNTER — Ambulatory Visit: Payer: BLUE CROSS/BLUE SHIELD | Admitting: Psychology

## 2017-12-30 ENCOUNTER — Ambulatory Visit: Payer: BLUE CROSS/BLUE SHIELD | Admitting: Sports Medicine

## 2018-01-05 ENCOUNTER — Other Ambulatory Visit: Payer: Self-pay

## 2018-01-05 ENCOUNTER — Telehealth: Payer: Self-pay | Admitting: Obstetrics & Gynecology

## 2018-01-05 MED ORDER — VALACYCLOVIR HCL 500 MG PO TABS: 500.0000 mg | ORAL_TABLET | Freq: Every day | ORAL | 0 refills | Status: DC

## 2018-01-05 NOTE — Telephone Encounter (Signed)
Patient requesting refill for a 90 day supply of Valacyclovir 500 mg. Patient has changed pharmacies and [facility].

## 2018-01-05 NOTE — Telephone Encounter (Signed)
Medication refill request: Valtrex 500 Last AEX: 08/24/17  Next AEX:09/01/18  Last MMG (if hormonal medication request):  04/29/16 Birads Category 1 neg Refill authorized: routing to [place] provider.

## 2018-01-14 NOTE — Telephone Encounter (Signed)
OK to close

## 2018-01-17 ENCOUNTER — Telehealth: Payer: Self-pay | Admitting: Family Medicine

## 2018-01-17 NOTE — Telephone Encounter (Signed)
Copied from [place] [id number]. Topic: Quick Communication - See Telephone Encounter >> Jan 17, 2018 11:51 AM Ahmed Prima L wrote: CRM for notification. See Telephone encounter for: 01/17/18.  Patient called and wanted to know what Dr Sarajane Jews suggested that she take for her constipation issue. She said that Murelax is not working anymore for her. Patient said that Dr Sarajane Jews knows she has this condition. Please call patient back.

## 2018-01-17 NOTE — Telephone Encounter (Signed)
Sent to PCP to advise 

## 2018-01-18 NOTE — Telephone Encounter (Signed)
Called patient with instructions given per [name], Patient expressed understanding. Nothing further needed.

## 2018-01-18 NOTE — Telephone Encounter (Signed)
Take Miralax with a full glass of water BID, and also take Milk of Magnesia one tbsp TID

## 2018-02-02 ENCOUNTER — Other Ambulatory Visit: Payer: Self-pay | Admitting: Family Medicine

## 2018-02-02 ENCOUNTER — Telehealth: Payer: Self-pay | Admitting: Obstetrics & Gynecology

## 2018-02-02 ENCOUNTER — Telehealth: Payer: Self-pay | Admitting: Family Medicine

## 2018-02-02 ENCOUNTER — Other Ambulatory Visit: Payer: Self-pay | Admitting: Obstetrics & Gynecology

## 2018-02-02 DIAGNOSIS — R413 Other amnesia: Secondary | ICD-10-CM

## 2018-02-02 DIAGNOSIS — R1084 Generalized abdominal pain: Secondary | ICD-10-CM

## 2018-02-02 DIAGNOSIS — Z1231 Encounter for screening mammogram for malignant neoplasm of breast: Secondary | ICD-10-CM

## 2018-02-02 NOTE — Telephone Encounter (Signed)
Patient is calling and requesting blood testing for the dementia gene and celiac maintenance testing (wanting to know if there are any change in levels since diagnosis in 2015).

## 2018-02-02 NOTE — Telephone Encounter (Signed)
I did order a celiac blood panel for her, but there is no blood test for Alzheimers

## 2018-02-02 NOTE — Telephone Encounter (Signed)
Spoke with patient.   1. Patient asking if labs for dementia gene and celiac maintenance can be done at [facility]? Recommended patient f/u with PCP for recommendations for labs. May also f/u with GI for celiac labs. Patient agreeable.   2. Patient reports taking progesterone days 1-15 of each month and estradiol patch twice wkly. Started spotting on 8/10, started as bleeding, now dark blood with "menses like cramps". Denies any heavy bleeding. Has not missed any pills. Patient reports this happened in 07/2017.   Recommended OV for further evaluation, patient declined. Patient states she is going to monitor through the weekend, will return call to schedule OV if not resolved. Advised patient OV recommended for evaluation of PMB, patient again declined. I will review with covering provider and return call with any additional recommendations.   Dr. Quincy Simmonds -please review and advise.  Cc: Dr. Sabra Heck

## 2018-02-02 NOTE — Telephone Encounter (Signed)
Please have patient schedule an appointment with Dr. Sabra Heck for an endometrial biopsy.  The patient has a history of recurrent postmenopausal bleeding and no endometrial sampling that I can see on her chart review. Dr. Sabra Heck will want to see her in the office for reassessment.

## 2018-02-02 NOTE — Telephone Encounter (Signed)
Copied from [place]. Topic: Inquiry >> Feb 02, 2018  9:59 AM Scherrie Gerlach wrote: Reason for CRM: pt wants to know is Dr Sarajane Jews able to do a siliac test (repeat from colonoscopy lab when she was diagnosed) and a lab test for alzheimers (dementia). Pt would like a call back to inform her.

## 2018-02-03 ENCOUNTER — Encounter: Payer: Self-pay | Admitting: Neurology

## 2018-02-03 NOTE — Telephone Encounter (Signed)
Spoke with patient, advised as seen below per Dr. Quincy Simmonds. Patient states she will return call to schedule OV when she gets to work, does not have her calendar. Patient verbalizes understanding.

## 2018-02-03 NOTE — Telephone Encounter (Signed)
Called and spoke with pt. Pt advised and voiced understanding.   Pt stated that she through that there was a diagnostic test that test for dementia MMSE Mini-Mental State Examination could she be referred to someone else to be tested for this?

## 2018-02-03 NOTE — Telephone Encounter (Signed)
Called pt and left a VM that a referral has been done for her to see a Neurology doctor to conduct the memory test. Advised pt that she should receive a call in 1-2 weeks with an appt. Advised pt to call our office if they have any futher questions or concerns.

## 2018-02-03 NOTE — Telephone Encounter (Signed)
Yes. I will refer her to Neurology to conduct memory tests.

## 2018-02-03 NOTE — Telephone Encounter (Signed)
Left message to call Miyo Aina at [phone].  

## 2018-02-04 ENCOUNTER — Other Ambulatory Visit: Payer: Self-pay | Admitting: Obstetrics and Gynecology

## 2018-02-04 ENCOUNTER — Other Ambulatory Visit: Payer: Self-pay

## 2018-02-04 NOTE — Telephone Encounter (Signed)
Medication refill request: valtrex 54m Last AEX:  08-24-17 Next AEX: 09-01-18 Last MMG (if hormonal medication request): 04-29-16 neg Refill authorized: last given #30 with 0 refills on 01-05-18

## 2018-02-07 ENCOUNTER — Other Ambulatory Visit (INDEPENDENT_AMBULATORY_CARE_PROVIDER_SITE_OTHER): Payer: BLUE CROSS/BLUE SHIELD

## 2018-02-07 DIAGNOSIS — R1084 Generalized abdominal pain: Secondary | ICD-10-CM

## 2018-02-08 NOTE — Telephone Encounter (Signed)
Left message to call Esteban Kobashigawa at [phone].  

## 2018-02-09 ENCOUNTER — Telehealth: Payer: Self-pay | Admitting: *Deleted

## 2018-02-09 LAB — CELIAC PANEL 10
Antigliadin Abs, IgA: 11 units (ref 0–19)
Endomysial IgA: NEGATIVE
Gliadin IgG: 36 units — ABNORMAL HIGH (ref 0–19)
IGA/IMMUNOGLOBULIN A, SERUM: 176 mg/dL (ref 87–352)
Tissue Transglut Ab: <2 U/mL (ref 0–5)

## 2018-02-09 NOTE — Telephone Encounter (Signed)
Jill Mcintyre, I think this pt saw Dr. Sharlene Motts, her PCP, on 8/19.  There is no note but there are labs done for transglutaminase antibodies (testing for celiac disease).  I wanted to give her a little more information about her dementia question.  Dementia is a general diagnosis that can come from many different causes and there is no specific genetic testing for this. There is for Alzheimer's disease (one of the specific causes of dementia).  Mostly this is done through the [facility] or [facility] or with through one of the many research studies going on at [facility].  She can check this out on their website or call [phone]) at [facility] to see exactly what studies are enrolling patients.  I've had two or three patients do the genetic testing for Alzheimer's and this is where/how it was done.  Lastly, I would also recommend her having an endometrial biopsy as recommended by Dr. Quincy Simmonds.  Please just communicate this to the pt and then ok to close encounter unless she has further questions.  Thanks.

## 2018-02-09 NOTE — Telephone Encounter (Signed)
Pt states she reviewed results of Celiac Panel 10 on Mychart and has additional questions.   Would like CB from [name]. [phone]

## 2018-02-10 DIAGNOSIS — F3181 Bipolar II disorder: Secondary | ICD-10-CM | POA: Diagnosis not present

## 2018-02-10 NOTE — Telephone Encounter (Signed)
I cannot find any previous celiac panels in her chart to compare this one to. As for possible lactose intolerance, the best way to tel is to stop all dairy in the diet for one month and see if it makes a difference

## 2018-02-10 NOTE — Telephone Encounter (Signed)
Pt stated that she does eat a gluten free diet but wanted to know if she needs to stop eating dairy as well?  Pt is still dealing with constipation and feeling bloated.

## 2018-02-10 NOTE — Telephone Encounter (Signed)
Called and spoke with pt. Pt advised and voiced understanding.  

## 2018-02-10 NOTE — Telephone Encounter (Signed)
I did offer the pt an appointment to speak with [name]. Pt stated that it's not needed she just wanted to know how her celiac panel results compared to her lab results 4 years ago.   Sent to PCP to advise.

## 2018-02-11 NOTE — Telephone Encounter (Signed)
Left message to call Farmer Mccahill at [phone].  

## 2018-02-15 NOTE — Telephone Encounter (Signed)
Left message to call Sonnie Bias at [phone].  

## 2018-02-18 ENCOUNTER — Ambulatory Visit: Payer: BLUE CROSS/BLUE SHIELD | Admitting: Family Medicine

## 2018-02-18 ENCOUNTER — Encounter: Payer: Self-pay | Admitting: Family Medicine

## 2018-02-18 VITALS — BP 114/80 | HR 72 | Temp 98.2°F | Ht 68.5 in | Wt 140.8 lb

## 2018-02-18 DIAGNOSIS — L409 Psoriasis, unspecified: Secondary | ICD-10-CM | POA: Insufficient documentation

## 2018-02-18 MED ORDER — CLOBETASOL PROPIONATE 0.05 % EX SOLN: 1.0000 "application " | Freq: Two times a day (BID) | CUTANEOUS | 2 refills | Status: DC

## 2018-02-18 NOTE — Progress Notes (Signed)
   Subjective:    Patient ID: Jill Mcintyre, female    DOB: April 16, 1961, 57 y.o.   MRN: [id number]  HPI Here to discuss psoriasis on her scalp. This causes burning and itching and flaking. She has had this for years, but lately it has gotten worse. Using T Gel shampoo. She sees [facility] but the soonest appt she can get to see them is 3 months away.    Review of Systems  Constitutional: Negative.   Respiratory: Negative.   Cardiovascular: Negative.   Musculoskeletal: Negative for arthralgias.  Skin: Positive for rash.       Objective:   Physical Exam  Constitutional: She appears well-developed and well-nourished.  Cardiovascular: Normal rate, regular rhythm, normal heart sounds and intact distal pulses.  Pulmonary/Chest: Effort normal and breath sounds normal.  Skin:  Scattered areas of erythema and flaking over the scalp           Assessment & Plan:  Psoriasis. Try Clobetasol solution BID and follow up with Dermatology. Alysia Penna, MD

## 2018-02-22 NOTE — Telephone Encounter (Signed)
Dr. Sabra Heck -patient has not returned call x3. Please advise.

## 2018-02-23 NOTE — Telephone Encounter (Signed)
Ok to close encounter. 

## 2018-03-04 ENCOUNTER — Other Ambulatory Visit: Payer: Self-pay | Admitting: Obstetrics and Gynecology

## 2018-03-04 NOTE — Telephone Encounter (Signed)
Medication refill request: Valtex 500 mg  Last AEX: 08/24/17  Next AEX: 09/01/18 Last MMG (if hormonal medication request): 04/29/16 Bi-rads category 1 neg  Refill authorized: Please refill if appropriate.

## 2018-03-12 ENCOUNTER — Other Ambulatory Visit: Payer: Self-pay | Admitting: Obstetrics & Gynecology

## 2018-03-14 ENCOUNTER — Ambulatory Visit
Admission: RE | Admit: 2018-03-14 | Discharge: 2018-03-14 | Disposition: A | Discharge status: Home / Self Care | Payer: BLUE CROSS/BLUE SHIELD | Source: Ambulatory Visit | Attending: Obstetrics & Gynecology | Admitting: Obstetrics & Gynecology

## 2018-03-14 DIAGNOSIS — Z1231 Encounter for screening mammogram for malignant neoplasm of breast: Secondary | ICD-10-CM

## 2018-03-14 NOTE — Telephone Encounter (Signed)
Medication refill request: progesterone 269m  Last AEX: 08/24/17 Next AEX: 09/01/18 Last MMG (if hormonal medication request): 04/29/16 Category 1 neg MM scheduled for today  Refill authorized: Please refill if appropriate.

## 2018-03-16 ENCOUNTER — Other Ambulatory Visit: Payer: Self-pay | Admitting: Obstetrics & Gynecology

## 2018-03-16 DIAGNOSIS — R928 Other abnormal and inconclusive findings on diagnostic imaging of breast: Secondary | ICD-10-CM

## 2018-03-18 ENCOUNTER — Ambulatory Visit
Admission: RE | Admit: 2018-03-18 | Discharge: 2018-03-18 | Disposition: A | Discharge status: Home / Self Care | Payer: BLUE CROSS/BLUE SHIELD | Source: Ambulatory Visit | Attending: Obstetrics & Gynecology | Admitting: Obstetrics & Gynecology

## 2018-03-18 ENCOUNTER — Other Ambulatory Visit: Payer: Self-pay | Admitting: Obstetrics & Gynecology

## 2018-03-18 ENCOUNTER — Ambulatory Visit: Payer: Self-pay

## 2018-03-18 DIAGNOSIS — R928 Other abnormal and inconclusive findings on diagnostic imaging of breast: Secondary | ICD-10-CM

## 2018-03-18 DIAGNOSIS — R921 Mammographic calcification found on diagnostic imaging of breast: Secondary | ICD-10-CM | POA: Diagnosis not present

## 2018-03-22 ENCOUNTER — Ambulatory Visit: Payer: Self-pay

## 2018-03-22 ENCOUNTER — Ambulatory Visit (INDEPENDENT_AMBULATORY_CARE_PROVIDER_SITE_OTHER): Payer: BLUE CROSS/BLUE SHIELD

## 2018-03-22 ENCOUNTER — Encounter: Payer: Self-pay | Admitting: Sports Medicine

## 2018-03-22 ENCOUNTER — Ambulatory Visit: Payer: BLUE CROSS/BLUE SHIELD | Admitting: Sports Medicine

## 2018-03-22 VITALS — BP 110/76 | HR 72 | Ht 68.5 in | Wt 145.2 lb

## 2018-03-22 DIAGNOSIS — M25511 Pain in right shoulder: Secondary | ICD-10-CM | POA: Diagnosis not present

## 2018-03-22 NOTE — Progress Notes (Signed)
Jill Mcintyre. Rigby, Jill Mcintyre  Jill Mcintyre - 57 y.o. female MRN [id number]  Date of birth: 05/26/1961  Visit Date: 03/22/2018  PCP: Laurey Morale, MD   Referred by: Laurey Morale, MD  Scribe for today's visit: Josepha Pigg, CMA     SUBJECTIVE:  Jill Mcintyre is here for Follow-up (R shoulder pain)  HPI: 11/18/2017: Her R shoulder pain symptoms INITIALLY: Began about 1 month ago and MOI is unknown.  Described as mild but can be severe sharp pain, radiating to the R arm but not below the elbow.  Worsened with sleeping on the R shoulder, shrugging shoulders, lifting.  Improved with rest. Additional associated symptoms include: She c/o decreased ROM, denies clicking and popping. Pain is worse when reaching down and back.    At this time symptoms are worsening compared to onset.  She has been taking IBU 800 mg with some relief. She has tried using Lidocaine patch and Biofreeze with temporary relief.  03/22/2018: Compared to the last office visit, her previously described symptoms are worsening. She feels constant throbbing in the shoulder. Her shoulder is tender to palpation, even her strap from her shirt causes irritation. Sx started to really flare up last week. She denies popping, clicking, catching. She reports decreased ROM. Sx worsen why lying on the R shoulder.  Current symptoms are moderate & are radiating to the deltoid.  She has been taking IBU and icing the shoulder with some relief. She received AC joint injection 11/18/17 and reports good relief with this.   ROS: Reports night time disturbances. Denies fevers, chills, or night sweats. Denies unexplained weight loss. Denies personal history of cancer. Denies changes in bowel or bladder habits. Denies recent unreported falls. Denies new or worsening dyspnea or wheezing. Denies headaches or dizziness.  Denies numbness, tingling  or weakness  In the extremities.  Denies dizziness or presyncopal episodes Denies lower extremity edema    HISTORY:  Prior history reviewed and updated per electronic medical record.  Social History   Occupational History  . Not on file  Tobacco Use  . Smoking status: Former Smoker    Start date: 10/07/1982  . Smokeless tobacco: Never Used  Substance and Sexual Activity  . Alcohol use: Yes    Alcohol/week: 0.0 standard drinks    Comment: occ  . Drug use: No  . Sexual activity: Yes    Partners: Male    Birth control/protection: Post-menopausal    Comment: partner vasectomy   Social History   Social History Narrative  . Not on file     DATA OBTAINED & REVIEWED:  No results for input(s): HGBA1C, LABURIC, CREATINE in the last 8760 hours. . Status post AC joint injection on 11/18/2017 . Glenohumeral joint injection on 03/22/2018 .   OBJECTIVE:  VS:  HT:5' 8.5" (174 cm)   WT:145 lb 3.2 oz (65.9 kg)  BMI:21.75    BP:110/76  HR:72bpm  TEMP: ( )  RESP:96 %   PHYSICAL EXAM: CONSTITUTIONAL: Well-developed, Well-nourished and In no acute distress PSYCHIATRIC: Alert & appropriately interactive. and Not depressed or anxious appearing. RESPIRATORY: No increased work of breathing and Trachea Midline EYES: Pupils are equal., EOM intact without nystagmus. and No scleral icterus.  VASCULAR EXAM: Warm and well perfused NEURO: unremarkable  MSK Exam: Right shoulder  Well aligned, no significant deformity. No overlying skin changes. No focal bony tenderness   RANGE OF MOTION & STRENGTH  Full overhead range of motion of the shoulder with pain with extension and flexion.   SPECIALITY TESTING:  Pain with speeds testing and with O'Brien's testing.  Minimal pain but positive crepitation with axial loading circumduction.  Localizing to the joint itself.  Empty can testing, internal rotation, external rotation strength is normal with only minimal pain    ASSESSMENT   1. Right  shoulder pain, unspecified chronicity   2. Acute pain of right shoulder     PLAN:  Pertinent additional documentation may be included in corresponding procedure notes, imaging studies, problem based documentation and patient instructions.  Procedures:  . US Guided Injection per procedure note  Medications:  No orders of the defined types were placed in this encounter.  Discussion/Instructions: No problem-specific Assessment & Plan notes found for this encounter.  Marland Kitchen Positive crepitation.  Concern for potential labral tear.  Discussed the options of MRI versus diagnostic and therapeutic injection today.  We will proceed with injection initially but if any lack of improvement consider MR arthrogram. . Will obtain x-rays after the office visit today and call her with these results if abnormal. . Discussed red flag symptoms that warrant earlier emergent evaluation and patient voices understanding. . Activity modifications and the importance of avoiding exacerbating activities (limiting pain to no more than a 4 / 10 during or following activity) recommended and discussed.  Follow-up:  . Return in about 6 weeks (around 05/03/2018).   . If any lack of improvement consider: further diagnostic evaluation with MR arthrogram shoulder  .      CMA/ATC served as Education administrator during this visit. History, Physical, and Plan performed by medical provider. Documentation and orders reviewed and attested to.      Gerda Diss, [facility] Physician

## 2018-03-22 NOTE — Procedures (Signed)
PROCEDURE NOTE:  Ultrasound Guided: Injection: Right shoulder Images were obtained and interpreted by myself, Teresa Coombs, DO  Images have been saved and stored to PACS system. Images obtained on: GE S7 Ultrasound machine    ULTRASOUND FINDINGS:  Minimal degenerative changes of the [place] joint Mild AC joint swelling. Minimal mushroom sign  DESCRIPTION OF PROCEDURE:  The patient's clinical condition is marked by substantial pain and/or significant functional disability. Other conservative therapy has not provided relief, is contraindicated, or not appropriate. There is a reasonable likelihood that injection will significantly improve the patient's pain and/or functional impairment.   After discussing the risks, benefits and expected outcomes of the injection and all questions were reviewed and answered, the patient wished to undergo the above named procedure.  Verbal consent was obtained.  The ultrasound was used to identify the target structure and adjacent neurovascular structures. The skin was then prepped in sterile fashion and the target structure was injected under direct visualization using sterile technique as below:  Single injection performed as below: PREP: Alcohol and Ethel Chloride APPROACH:posterior, single injection, 21g 2 in. INJECTATE: 2 cc 0.5% Marcaine and 2 cc 29m/mL DepoMedrol ASPIRATE: None DRESSING: Band-Aid  Post procedural instructions including recommending icing and warning signs for infection were reviewed.    This procedure was well tolerated and there were no complications.   IMPRESSION: Succesful Ultrasound Guided: Injection

## 2018-03-22 NOTE — Patient Instructions (Signed)

## 2018-03-24 ENCOUNTER — Ambulatory Visit: Payer: BLUE CROSS/BLUE SHIELD | Admitting: Psychology

## 2018-03-24 DIAGNOSIS — F312 Bipolar disorder, current episode manic severe with psychotic features: Secondary | ICD-10-CM

## 2018-03-25 ENCOUNTER — Other Ambulatory Visit: Payer: Self-pay

## 2018-03-25 ENCOUNTER — Encounter: Payer: Self-pay | Admitting: Neurology

## 2018-03-25 ENCOUNTER — Other Ambulatory Visit (INDEPENDENT_AMBULATORY_CARE_PROVIDER_SITE_OTHER): Payer: BLUE CROSS/BLUE SHIELD

## 2018-03-25 ENCOUNTER — Ambulatory Visit: Payer: BLUE CROSS/BLUE SHIELD | Admitting: Neurology

## 2018-03-25 VITALS — BP 122/70 | HR 76 | Ht 68.0 in | Wt 140.0 lb

## 2018-03-25 DIAGNOSIS — R413 Other amnesia: Secondary | ICD-10-CM | POA: Diagnosis not present

## 2018-03-25 NOTE — Progress Notes (Signed)
NEUROLOGY CONSULTATION NOTE  Jill Mcintyre MRN: [id number] DOB: 1961/05/28  Referring provider: Dr. Alysia Penna Primary care provider: Dr. Alysia Penna  Reason for consult:  Memory loss  Dear Dr Sarajane Jews:  Thank you for your kind referral of Jill Mcintyre for consultation of the above symptoms. Although her history is well known to you, please allow me to reiterate it for the purpose of our medical record. She is alone in the office today. Records and images were personally reviewed where available.  HISTORY OF PRESENT ILLNESS: This is a very pleasant 57 year old right-handed woman with a history of celiac disease, bipolar disorder, presenting for evaluation of memory loss. She reports her memory is "terrible." She started noticing memory changes over the past several years which she was attributing to menopause. Her paternal grandmother recently passed away with dementia, and this started concerning her. She has noticed short-term memory changes, unable to recall what she ate yesterday. She has difficulty remembering names and has noticed difficulty finding words. She denies getting lost driving. All her bills are on autopay. She denies missing medications. She lives alone, she left her 2nd husband last March. She just started back to work last June 2019 after a 3 year hiatus. 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 Aside from her grandmother, she has noticed her mother has memory issues as well. She denies any significant head injuries. She drinks a glass of wine daily, she was drinking more during the time of her separation.   She has occasional dizziness and seeing little sparks with head movements, worse prior to her diagnosis of celiac disease. She has constipation and neck pain. She denies any headaches, diplopia, dysarthria/dysphagia, back pain, focal numbness/tingling/weakness, anosmia, or tremors. Mood is currently good, she follows  closely with her therapist and psychiatrist. She has been on Lamictal for at least 8-10 years. She rarely takes Valium. She usually gets 55 hours of sleep and is very energetic, no daytime drowsiness.   Laboratory Data: Lab Results  Component Value Date   TSH 1.250 08/24/2017     PAST MEDICAL HISTORY: Past Medical History:  Diagnosis Date  . Abnormal Pap smear of cervix 2013   ASCUS  . Acne    sees Dr. Jarome Matin for skin checks and acne  . Anemia   . Anxiety   . Bipolar 1 disorder ([place])   . Bipolar depression ([place])    sees Dr Gala Murdoch at [facility] and sees Trey Paula for  therapy  . Constipation   . GERD (gastroesophageal reflux disease)   . Herpes simplex   . IBS (irritable bowel syndrome)   . Insomnia   . Migraines   . Pneumonia 2019    PAST SURGICAL HISTORY: Past Surgical History:  Procedure Laterality Date  . BASAL CELL CARCINOMA EXCISION     Dr Amy Martinique  . BREAST ENHANCEMENT SURGERY    . CESAREAN SECTION  1996  . COLONOSCOPY  2015   per Dr. Collene Mares, clear, repeat in 10 yrs   . COLPOSCOPY  10/07/11   CIN I  . ESOPHAGOGASTRODUODENOSCOPY  09/07/2006   Dr Ardis Hughs    MEDICATIONS: Current Outpatient Medications on File Prior to Visit  Medication Sig Dispense Refill  . Biotin (BIOTIN 5000) 5 MG CAPS Take 1,000 mg by mouth daily. Reported on 12/03/2015    . clobetasol (TEMOVATE) 0.05 % external solution Apply 1 application topically 2 (two) times daily. 50 mL  2  . diazepam (VALIUM) 5 MG tablet Take 1 tablet (5 mg total) by mouth 2 (two) times daily as needed for anxiety. 180 tablet 1  . estradiol (VIVELLE-DOT) 0.075 MG/24HR Place 1 patch onto the skin 2 (two) times a week. 24 patch 4  . ibuprofen (ADVIL,MOTRIN) 800 MG tablet TAKE 1 TABLET (800 MG TOTAL) BY MOUTH EVERY 8 (EIGHT) HOURS AS NEEDED FOR MODERATE PAIN. 60 tablet 5  . lamoTRIgine (LAMICTAL) 100 MG tablet Take 150 mg by mouth daily.     . mometasone (ELOCON) 0.1 % ointment Apply a pea sized  amount topically BID x 1-2 weeks 15 g 0  . Multiple Vitamin (MULTI-VITAMIN PO) Take by mouth daily.    . progesterone (PROMETRIUM) 200 MG capsule TAKE 1 CAPSULE BY MOUTH EVERYDAY AT BEDTIME 30 capsule 6  . sertraline (ZOLOFT) 50 MG tablet Take 50 mg by mouth daily.  2  . traMADol (ULTRAM) 50 MG tablet TAKE 1/2 TABLET BY MOUTH EVERY 8 HRS AS NEEDED 60 tablet 5  . traZODone (DESYREL) 50 MG tablet TAKE 1-2 TABLET AT BEDTIME AS NEEDED FOR SLEEP  2  . valACYclovir (VALTREX) 500 MG tablet TAKE 1 TABLET BY MOUTH EVERY DAY 90 tablet 1   No current facility-administered medications on file prior to visit.     ALLERGIES: Allergies  Allergen Reactions  . Ciprofloxacin Hives  . Sulfa Antibiotics     hives    FAMILY HISTORY: Family History  Problem Relation Age of Onset  . Cancer Mother   . Alcohol abuse Unknown   . Arthritis Unknown   . Cancer Unknown        breast, ovrarian, uterine    SOCIAL HISTORY: Social History   Socioeconomic History  . Marital status: Married    Spouse name: Not on file  . Number of children: Not on file  . Years of education: Not on file  . Highest education level: Not on file  Occupational History  . Not on file  Social Needs  . Financial resource strain: Not on file  . Food insecurity:    Worry: Not on file    Inability: Not on file  . Transportation needs:    Medical: Not on file    Non-medical: Not on file  Tobacco Use  . Smoking status: Former Smoker    Start date: 10/07/1982  . Smokeless tobacco: Never Used  Substance and Sexual Activity  . Alcohol use: Yes    Alcohol/week: 0.0 standard drinks    Comment: occ  . Drug use: No  . Sexual activity: Yes    Partners: Male    Birth control/protection: Post-menopausal    Comment: partner vasectomy  Lifestyle  . Physical activity:    Days per week: Not on file    Minutes per session: Not on file  . Stress: Not on file  Relationships  . Social connections:    Talks on phone: Not on file     Gets together: Not on file    Attends religious service: Not on file    Active member of club or organization: Not on file    Attends meetings of clubs or organizations: Not on file    Relationship status: Not on file  . Intimate partner violence:    Fear of current or ex partner: Not on file    Emotionally abused: Not on file    Physically abused: Not on file    Forced sexual activity: Not on file  Other Topics  Concern  . Not on file  Social History Narrative  . Not on file    REVIEW OF SYSTEMS: Constitutional: No fevers, chills, or sweats, no generalized fatigue, change in appetite Eyes: No visual changes, double vision, eye pain Ear, nose and throat: No hearing loss, ear pain, nasal congestion, sore throat Cardiovascular: No chest pain, palpitations Respiratory:  No shortness of breath at rest or with exertion, wheezes GastrointestinaI: No nausea, vomiting, diarrhea, abdominal pain, fecal incontinence Genitourinary:  No dysuria, urinary retention or frequency Musculoskeletal:  + neck pain, no back pain Integumentary: No rash, pruritus, skin lesions Neurological: as above Psychiatric: + depression, anxiety Endocrine: No palpitations, fatigue, diaphoresis, mood swings, change in appetite, change in weight, increased thirst Hematologic/Lymphatic:  No anemia, purpura, petechiae. Allergic/Immunologic: no itchy/runny eyes, nasal congestion, recent allergic reactions, rashes  PHYSICAL EXAM: Vitals:   03/25/18 1411  BP: 122/70  Pulse: 76  SpO2: 98%   General: No acute distress Head:  Normocephalic/atraumatic Eyes: Fundoscopic exam shows bilateral sharp discs, no vessel changes, exudates, or hemorrhages Neck: supple, no paraspinal tenderness, full range of motion Back: No paraspinal tenderness Heart: regular rate and rhythm Lungs: Clear to auscultation bilaterally. Vascular: No carotid bruits. Skin/Extremities: No rash, no edema Neurological Exam: Mental status: alert and  oriented to person, place, and time, no dysarthria or aphasia, Fund of knowledge is appropriate.  Recent and remote memory are intact.  Attention and concentration are normal.    Able to name objects and repeat phrases.  Montreal Cognitive Assessment  03/25/2018  Visuospatial/ Executive (0/5) 5  Naming (0/3) 3  Attention: Read list of digits (0/2) 2  Attention: Read list of letters (0/1) 1  Attention: Serial 7 subtraction starting at 100 (0/3) 3  Language: Repeat phrase (0/2) 2  Language : Fluency (0/1) 1  Abstraction (0/2) 2  Delayed Recall (0/5) 5  Orientation (0/6) 6  Total 30   Cranial nerves: CN I: not tested CN II: pupils equal, round and reactive to light, visual fields intact, fundi unremarkable. CN III, IV, VI:  full range of motion, no nystagmus, no ptosis CN V: facial sensation intact CN VII: upper and lower face symmetric CN VIII: hearing intact to finger rub CN IX, X: gag intact, uvula midline CN XI: sternocleidomastoid and trapezius muscles intact CN XII: tongue midline Bulk & Tone: normal, no fasciculations. Motor: 5/5 throughout with no pronator drift. Sensation: intact to light touch, cold, pin, vibration and joint position sense.  No extinction to double simultaneous stimulation.  Romberg test negative Deep Tendon Reflexes: +2 throughout, no ankle clonus Plantar responses: downgoing bilaterally Cerebellar: no incoordination on finger to nose, heel to shin. No dysdiadochokinesia Gait: narrow-based and steady, able to tandem walk adequately. Tremor: none  IMPRESSION: This is a very pleasant 57 year old right-handed woman with a history of celiac disease, bipolar disorder, presenting for evaluation of memory loss. She became more concerned with recent passing of her grandmother with dementia. Her neurological exam is normal, MOCA score today normal 30/30. She was reassured that there are no signs of dementia currently. We discussed different causes of memory loss,  check B12 level. We discussed how mood can also affect memory, she reports this is likely a big factor. She is asking about genetic testing for dementia, we discussed that genetic testing is currently not recommended standard of care. We discussed doing Neurocognitive testing to further evaluate memory complaints and establish a baseline. We discussed the importance of control of vascular risk factors, physical exercise,  and brain stimulation exercises for brain health. Follow-up in 1 year, she knows to call for any changes.   Thank you for allowing me to participate in the care of this patient. Please do not hesitate to call for any questions or concerns.   Ellouise Newer, M.D.  CC: [name]

## 2018-03-25 NOTE — Patient Instructions (Addendum)
1. Bloodwork for B12 level  Your provider requests that you have LABS drawn today.  We share a lab with [facility] - they are located in [address] (second floor) of this building.  Once you get there, please have a seat and the phlebotomist will call your name.  If you have waited more than 15 minutes, please advise the front desk  2. Schedule Neurocognitive testing with Dr. Si Raider 3. Follow-up in 1 year, call for any changes  RECOMMENDATIONS FOR ALL PATIENTS WITH MEMORY PROBLEMS: 1. Continue to exercise (Recommend 30 minutes of walking everyday, or 3 hours every week) 2. Increase social interactions - continue going to [place] and enjoy social gatherings with friends and family 3. Eat healthy, avoid fried foods and eat more fruits and vegetables 4. Maintain adequate blood pressure, blood sugar, and blood cholesterol level. Reducing the risk of stroke and cardiovascular disease also helps promoting better memory. 5. Avoid stressful situations. Live a simple life and avoid aggravations. Organize your time and prepare for the next day in anticipation. 6. Sleep well, avoid any interruptions of sleep and avoid any distractions in the bedroom that may interfere with adequate sleep quality 7. Avoid sugar, avoid sweets as there is a strong link between excessive sugar intake, diabetes, and cognitive impairment We discussed the Mediterranean diet, which has been shown to help patients reduce the risk of progressive memory disorders and reduces cardiovascular risk. This includes eating fish, eat fruits and green leafy vegetables, nuts like almonds and hazelnuts, walnuts, and also use olive oil. Avoid fast foods and fried foods as much as possible. Avoid sweets and sugar as sugar use has been linked to worsening of memory function.

## 2018-03-26 LAB — VITAMIN B12: VITAMIN B 12: 896 pg/mL (ref 200–1100)

## 2018-03-28 ENCOUNTER — Other Ambulatory Visit: Payer: Self-pay

## 2018-03-28 DIAGNOSIS — F411 Generalized anxiety disorder: Secondary | ICD-10-CM

## 2018-03-28 DIAGNOSIS — Z658 Other specified problems related to psychosocial circumstances: Secondary | ICD-10-CM

## 2018-03-31 ENCOUNTER — Encounter: Payer: Self-pay | Admitting: Sports Medicine

## 2018-03-31 ENCOUNTER — Other Ambulatory Visit: Payer: Self-pay | Admitting: Physical Therapy

## 2018-03-31 DIAGNOSIS — G8929 Other chronic pain: Secondary | ICD-10-CM

## 2018-03-31 DIAGNOSIS — M25511 Pain in right shoulder: Principal | ICD-10-CM

## 2018-04-04 NOTE — Telephone Encounter (Signed)
Spoke with pt to find out if she has been compliant with HEP that was provided 11/18/2017. Pt advised that she was compliant with HEP using red band that she was given 11/18/2017.

## 2018-04-13 ENCOUNTER — Ambulatory Visit
Admission: RE | Admit: 2018-04-13 | Discharge: 2018-04-13 | Disposition: A | Discharge status: Home / Self Care | Payer: BLUE CROSS/BLUE SHIELD | Source: Ambulatory Visit | Attending: Sports Medicine | Admitting: Sports Medicine

## 2018-04-13 DIAGNOSIS — G8929 Other chronic pain: Secondary | ICD-10-CM

## 2018-04-13 DIAGNOSIS — M25511 Pain in right shoulder: Principal | ICD-10-CM

## 2018-04-13 DIAGNOSIS — M75111 Incomplete rotator cuff tear or rupture of right shoulder, not specified as traumatic: Secondary | ICD-10-CM | POA: Diagnosis not present

## 2018-04-13 MED ORDER — IOPAMIDOL (ISOVUE-M 200) INJECTION 41%
15.0000 mL | Freq: Once | INTRAMUSCULAR | Status: AC
  Administered 2018-04-13: 15 mL via INTRA_ARTICULAR

## 2018-04-14 ENCOUNTER — Other Ambulatory Visit: Payer: Self-pay | Admitting: Physical Therapy

## 2018-04-14 DIAGNOSIS — M25511 Pain in right shoulder: Principal | ICD-10-CM

## 2018-04-14 DIAGNOSIS — G8929 Other chronic pain: Secondary | ICD-10-CM

## 2018-04-14 DIAGNOSIS — M19019 Primary osteoarthritis, unspecified shoulder: Secondary | ICD-10-CM

## 2018-04-14 NOTE — Progress Notes (Signed)
My chart message as below.  Please follow-up to see what she would like to do:    There are multiple findings on your MRI that would explain some of the pain that you are having.  For the most part this looks as though you have AC joint arthropathy which is what we had injected the first time.  This is something that does tend to respond well to surgery since you have failed injections.  I am happy to refer you to an orthopedic surgeon if you would like or we can have you come back and to follow-up with me and consider repeating the injection we did at your first visit.  Please let us know if you would come in to see me or if you would like to be referred over to discuss surgery.

## 2018-04-20 ENCOUNTER — Ambulatory Visit: Payer: BLUE CROSS/BLUE SHIELD | Admitting: Psychology

## 2018-04-20 ENCOUNTER — Other Ambulatory Visit: Payer: Self-pay | Admitting: Family Medicine

## 2018-04-20 DIAGNOSIS — F312 Bipolar disorder, current episode manic severe with psychotic features: Secondary | ICD-10-CM | POA: Diagnosis not present

## 2018-04-25 ENCOUNTER — Ambulatory Visit (INDEPENDENT_AMBULATORY_CARE_PROVIDER_SITE_OTHER): Payer: BLUE CROSS/BLUE SHIELD | Admitting: Orthopedic Surgery

## 2018-04-25 DIAGNOSIS — M19011 Primary osteoarthritis, right shoulder: Secondary | ICD-10-CM

## 2018-04-25 MED ORDER — IBUPROFEN-FAMOTIDINE 800-26.6 MG PO TABS: ORAL_TABLET | ORAL | 0 refills | Status: DC

## 2018-04-27 NOTE — Telephone Encounter (Signed)
Pharmacy is calling to request a update on the medication. Please advise

## 2018-04-30 ENCOUNTER — Encounter (INDEPENDENT_AMBULATORY_CARE_PROVIDER_SITE_OTHER): Payer: Self-pay | Admitting: Orthopedic Surgery

## 2018-04-30 DIAGNOSIS — M19011 Primary osteoarthritis, right shoulder: Secondary | ICD-10-CM

## 2018-04-30 MED ORDER — BUPIVACAINE HCL 0.25 % IJ SOLN
0.6600 mL | INTRAMUSCULAR | Status: AC | PRN
  Administered 2018-04-30: .66 mL via INTRA_ARTICULAR

## 2018-04-30 MED ORDER — METHYLPREDNISOLONE ACETATE 40 MG/ML IJ SUSP
13.3300 mg | INTRAMUSCULAR | Status: AC | PRN
  Administered 2018-04-30: 13.33 mg via INTRA_ARTICULAR

## 2018-04-30 MED ORDER — LIDOCAINE HCL 1 % IJ SOLN
3.0000 mL | INTRAMUSCULAR | Status: AC | PRN
  Administered 2018-04-30: 3 mL

## 2018-04-30 NOTE — Progress Notes (Signed)
Office Visit Note   Patient: Jill Mcintyre           Date of Birth: 10/15/1960           MRN: [id number] Visit Date: 04/25/2018 Requested by: Laurey Morale, MD [place], [place] [address] PCP: Laurey Morale, MD  Subjective: Chief Complaint  Patient presents with  . Right Shoulder - Pain    HPI: Jill Mcintyre is a patient with right shoulder pain.  She is had pain for several months.  Denies any history of injury.  Localizes the pain to the superior aspect of the shoulder.  She did have an injection with Dr. Paulla Fore at the end of May.  This was an [facility] joint injection which gave her several months of relief.  In September she had a subacromial injection which only gave her about 10 days of relief.  She is had an MRI scan which shows fairly significant AC joint arthritis with edema on both sides of the joint.  Cuff and labrum intact.  She denies any mechanical symptoms in the shoulder.  She does sedentary work.  Hard for her to carry things on that right-hand side.  She does walk for exercise.  Ibuprofen and tramadol have not been very helpful but ice has been helpful.              ROS: All systems reviewed are negative as they relate to the chief complaint within the history of present illness.  Patient denies  fevers or chills.   Assessment & Plan: Visit Diagnoses:  1. Arthritis of right acromioclavicular joint     Plan: Impression is right shoulder symptomatic AC joint arthritis with no other real significant pathology present on MRI scan or on exam.  Plan is second and final AC joint injection today under ultrasound guidance with topical Pennsaid provided and prescribed.  I will see her back in 6 or 8 weeks if she decides that she wants to proceed with surgical intervention which would be arthroscopic distal clavicle excision.  Discussed a little bit about the surgery with her today.  In general timing wise it might be better for her to do that later if needed.  I think  it is worth trying a second AC joint injection at this time along with a topical to see if that will be enough to prevent her from having surgery.  Follow-Up Instructions: Return if symptoms worsen or fail to improve.   Orders:  No orders of the defined types were placed in this encounter.  Meds ordered this encounter  Medications  . Ibuprofen-Famotidine (DUEXIS) 800-26.6 MG TABS    Sig: 1 po q d to bid prn    Dispense:  90 tablet    Refill:  0      Procedures: Medium Joint Inj: R acromioclavicular on 04/30/2018 7:39 PM Indications: pain and diagnostic evaluation Details: 27 G 1.5 in needle, ultrasound-guided superior approach Medications: 13.33 mg methylPREDNISolone acetate 40 MG/ML; 0.66 mL bupivacaine 0.25 %; 3 mL lidocaine 1 % Outcome: tolerated well, no immediate complications Procedure, treatment alternatives, risks and benefits explained, specific risks discussed. Consent was given by the patient. Immediately prior to procedure a time out was called to verify the correct patient, procedure, equipment, support staff and site/side marked as required. Patient was prepped and draped in the usual sterile fashion.       Clinical Data: No additional findings.  Objective: Vital Signs: LMP 09/14/2014   Physical Exam:  Constitutional: Patient appears well-developed HEENT:  Head: Normocephalic Eyes:EOM are normal Neck: Normal range of motion Cardiovascular: Normal rate Pulmonary/chest: Effort normal Neurologic: Patient is alert Skin: Skin is warm Psychiatric: Patient has normal mood and affect    Ortho Exam: Ortho exam demonstrates good cervical spine range of motion.  Patient does not have restriction of external rotation of 15 degrees of abduction on the right or left hand side.  Cuff strength is intact and O'Brien's testing is negative on the right.  She does have asymmetric AC joint tenderness on the right-hand side compared to the left.  Negative apprehension  relocation testing.  Specialty Comments:  No specialty comments available.  Imaging: No results found.   PMFS History: Patient Active Problem List   Diagnosis Date Noted  . Psoriasis of scalp 02/18/2018  . AC joint arthropathy 11/18/2017  . Postmenopausal HRT (hormone replacement therapy) 12/04/2015  . Psychosocial stressors 12/04/2015  . Bipolar disorder ([place]) 12/20/2014  . Celiac disease 10/07/2014  . OLECRANON BURSITIS 04/02/2009  . ADHD 12/20/2008  . URTICARIA 10/26/2008  . ANEMIA, IRON DEFICIENCY, MICROCYTIC 07/24/2008  . Anxiety state 03/29/2007  . DEPRESSION 03/29/2007  . ASTHMA 03/29/2007  . GERD 03/29/2007   Past Medical History:  Diagnosis Date  . Abnormal Pap smear of cervix 2013   ASCUS  . Acne    sees Dr. Jarome Matin for skin checks and acne  . Anemia   . Anxiety   . Bipolar 1 disorder ([place])   . Bipolar depression ([place])    sees Dr Gala Murdoch at [facility] and sees Trey Paula for  therapy  . Constipation   . GERD (gastroesophageal reflux disease)   . Herpes simplex   . IBS (irritable bowel syndrome)   . Insomnia   . Migraines   . Pneumonia 2019    Family History  Problem Relation Age of Onset  . Cancer Mother   . Alcohol abuse Unknown   . Arthritis Unknown   . Cancer Unknown        breast, ovrarian, uterine    Past Surgical History:  Procedure Laterality Date  . BASAL CELL CARCINOMA EXCISION     Dr Amy Martinique  . BREAST ENHANCEMENT SURGERY    . CESAREAN SECTION  1996  . COLONOSCOPY  2015   per Dr. Collene Mares, clear, repeat in 10 yrs   . COLPOSCOPY  10/07/11   CIN I  . ESOPHAGOGASTRODUODENOSCOPY  09/07/2006   Dr Ardis Hughs   Social History   Occupational History  . Not on file  Tobacco Use  . Smoking status: Former Smoker    Start date: 10/07/1982  . Smokeless tobacco: Never Used  Substance and Sexual Activity  . Alcohol use: Yes    Alcohol/week: 0.0 standard drinks    Comment: occ  . Drug use: No  . Sexual activity: Yes     Partners: Male    Birth control/protection: Post-menopausal    Comment: partner vasectomy

## 2018-05-03 ENCOUNTER — Ambulatory Visit: Payer: BLUE CROSS/BLUE SHIELD | Admitting: Sports Medicine

## 2018-05-03 NOTE — Telephone Encounter (Signed)
Call in #[phone] with 5 rf 

## 2018-05-03 NOTE — Telephone Encounter (Signed)
[name] please advise. On refills. Thanks

## 2018-05-03 NOTE — Telephone Encounter (Signed)
Refill has been called to the pharmacy.

## 2018-05-04 ENCOUNTER — Telehealth: Payer: Self-pay | Admitting: Neurology

## 2018-05-04 NOTE — Telephone Encounter (Signed)
Dr Bailar is leaving our office to take a job closer to where she lives. We have mailed a letter to patient to let her know we have canceled all appointments with Bailar. Thank you for your understanding °

## 2018-05-16 ENCOUNTER — Encounter: Payer: Self-pay | Admitting: Family Medicine

## 2018-05-16 DIAGNOSIS — K2100 Gastro-esophageal reflux disease with esophagitis, without bleeding: Secondary | ICD-10-CM

## 2018-05-16 NOTE — Telephone Encounter (Signed)
Dr. Fry please advise. Thanks  

## 2018-05-17 DIAGNOSIS — F3181 Bipolar II disorder: Secondary | ICD-10-CM | POA: Diagnosis not present

## 2018-05-17 NOTE — Telephone Encounter (Signed)
I did the referral to Dr. Collene Mares. Also she should be taking Prilosec 20 mg OTC twice a day

## 2018-05-30 ENCOUNTER — Telehealth: Payer: Self-pay | Admitting: Neurology

## 2018-05-30 ENCOUNTER — Ambulatory Visit (INDEPENDENT_AMBULATORY_CARE_PROVIDER_SITE_OTHER): Payer: BLUE CROSS/BLUE SHIELD | Admitting: Psychology

## 2018-05-30 DIAGNOSIS — F312 Bipolar disorder, current episode manic severe with psychotic features: Secondary | ICD-10-CM | POA: Diagnosis not present

## 2018-05-30 NOTE — Telephone Encounter (Signed)
Spoke with patient regarding the letter she received (Dr. Si Raider leaving). Patient would prefer to not go to [place] or [place]. She prefers to wait for Dr Si Raider to be replaced so she can come here.

## 2018-05-31 ENCOUNTER — Telehealth: Payer: Self-pay | Admitting: Obstetrics & Gynecology

## 2018-05-31 NOTE — Telephone Encounter (Signed)
Patient called and requested to come in for an appointment to have her hormones tested. Routing to triage for assessment on whether office visit is needed or just a lab appointment.

## 2018-05-31 NOTE — Telephone Encounter (Signed)
Spoke with patient. Patient reports changes in mood, increased acne, increased hot flashes and fatigue. Patient requesting lab appt to have "hormone levels" checked. Psychologist recently increased Zoloft from 47m to 1025m Denies any vaginal bleeding, pain, d/c or odor. Taking progesterone 20070mays 1-15.   Advised OV recommended for further evaluation. OV scheduled for 06/09/18 at 1:30pm with Dr. MilSabra Heck Routing to provider for final review. Patient is agreeable to disposition. Will close encounter.

## 2018-06-09 ENCOUNTER — Ambulatory Visit: Payer: Self-pay | Admitting: Obstetrics & Gynecology

## 2018-06-09 ENCOUNTER — Telehealth: Payer: Self-pay | Admitting: Obstetrics & Gynecology

## 2018-06-09 ENCOUNTER — Encounter: Payer: Self-pay | Admitting: Obstetrics & Gynecology

## 2018-06-09 NOTE — Telephone Encounter (Signed)
VOID

## 2018-06-10 ENCOUNTER — Encounter: Payer: Self-pay | Admitting: Obstetrics & Gynecology

## 2018-06-10 ENCOUNTER — Ambulatory Visit: Payer: BLUE CROSS/BLUE SHIELD | Admitting: Obstetrics & Gynecology

## 2018-06-10 ENCOUNTER — Other Ambulatory Visit: Payer: Self-pay

## 2018-06-10 VITALS — BP 146/92 | HR 88 | Resp 16 | Ht 68.0 in | Wt 140.4 lb

## 2018-06-10 DIAGNOSIS — K900 Celiac disease: Secondary | ICD-10-CM | POA: Diagnosis not present

## 2018-06-10 DIAGNOSIS — N951 Menopausal and female climacteric states: Secondary | ICD-10-CM
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
## 2018-06-11 LAB — CBC
HEMATOCRIT: 39.7 % (ref 34.0–46.6)
HEMOGLOBIN: 13.1 g/dL (ref 11.1–15.9)
MCH: 30.4 pg (ref 26.6–33.0)
MCHC: 33 g/dL (ref 31.5–35.7)
MCV: 92 fL (ref 79–97)
Platelets: 249 10*3/uL (ref 150–450)
RBC: 4.31 x10E6/uL (ref 3.77–5.28)
RDW: 12.2 % — ABNORMAL LOW (ref 12.3–15.4)
WBC: 6.6 10*3/uL (ref 3.4–10.8)

## 2018-06-11 LAB — ESTRADIOL: ESTRADIOL: 12.9 pg/mL

## 2018-06-11 LAB — PROGESTERONE: PROGESTERONE: 0.3 ng/mL

## 2018-06-14 ENCOUNTER — Other Ambulatory Visit: Payer: Self-pay | Admitting: Gastroenterology

## 2018-06-14 DIAGNOSIS — R1011 Right upper quadrant pain: Secondary | ICD-10-CM

## 2018-06-14 DIAGNOSIS — K219 Gastro-esophageal reflux disease without esophagitis: Secondary | ICD-10-CM | POA: Diagnosis not present

## 2018-06-14 DIAGNOSIS — K5904 Chronic idiopathic constipation: Secondary | ICD-10-CM | POA: Diagnosis not present

## 2018-06-14 DIAGNOSIS — R1013 Epigastric pain: Secondary | ICD-10-CM | POA: Diagnosis not present

## 2018-06-16 ENCOUNTER — Telehealth: Payer: Self-pay | Admitting: Obstetrics & Gynecology

## 2018-06-16 NOTE — Telephone Encounter (Signed)
Thank you for the clarification.  Since she is cutting the patching in half and has plenty of them, I think the 0.33m dosing will be too much.  If she feels comfortable cutting about 1/3 off the patch, that will increase her dosage to 0.065mpatch.  She will stay on the Prometrium 20030mays 1-15 each month.

## 2018-06-16 NOTE — Telephone Encounter (Signed)
Spoke with patient and she is given message from Dr. Sabra Heck.  Will try new dosage as directed and will call back with any concerns.   She will call back with any increase in acne.  She thinks she may want to have testosterone level checked.   Encounter closed.

## 2018-06-16 NOTE — Telephone Encounter (Signed)
Spoke with patient and message given. She is agreeable to plan.   Need to clarify dosages of patches/prometrium  .  Currently she is using 1/2 Vivelle Dot 0.075 patch twice weekly (cutting in half, she states she has plenty of this dose at home) and Prometrium 200 mg PO QHS on 1st-15th.

## 2018-06-16 NOTE — Telephone Encounter (Signed)
Patient calling for results from lab work.

## 2018-06-16 NOTE — Telephone Encounter (Signed)
-----   Message from Megan Salon, MD sent at 06/13/2018  1:38 PM EST ----- Please let pt know her CBC was fine.  Her estradiol was low as 12.9 and her progesterone level was very low.  These lab tests would indicate I need to increase her patch dosage.  She is on a 0.071m estradiol patch.  OK to increased to 0.147mpatch twice weekly.  She does have some acne issues so this could worsen with the increased dosage.  I do want to know if this is the case.  Thanks.

## 2018-06-17 DIAGNOSIS — K2970 Gastritis, unspecified, without bleeding: Secondary | ICD-10-CM | POA: Diagnosis not present

## 2018-06-17 DIAGNOSIS — R1013 Epigastric pain: Secondary | ICD-10-CM | POA: Diagnosis not present

## 2018-06-17 DIAGNOSIS — K219 Gastro-esophageal reflux disease without esophagitis: Secondary | ICD-10-CM | POA: Diagnosis not present

## 2018-06-27 ENCOUNTER — Other Ambulatory Visit (HOSPITAL_COMMUNITY): Payer: Self-pay | Admitting: Gastroenterology

## 2018-06-27 DIAGNOSIS — R1011 Right upper quadrant pain: Secondary | ICD-10-CM

## 2018-06-29 ENCOUNTER — Ambulatory Visit (INDEPENDENT_AMBULATORY_CARE_PROVIDER_SITE_OTHER): Payer: BLUE CROSS/BLUE SHIELD | Admitting: Psychology

## 2018-06-29 DIAGNOSIS — F312 Bipolar disorder, current episode manic severe with psychotic features: Secondary | ICD-10-CM | POA: Diagnosis not present

## 2018-07-07 DIAGNOSIS — M9908 Segmental and somatic dysfunction of rib cage: Secondary | ICD-10-CM | POA: Diagnosis not present

## 2018-07-07 DIAGNOSIS — M9902 Segmental and somatic dysfunction of thoracic region: Secondary | ICD-10-CM | POA: Diagnosis not present

## 2018-07-08 ENCOUNTER — Ambulatory Visit (INDEPENDENT_AMBULATORY_CARE_PROVIDER_SITE_OTHER): Payer: BLUE CROSS/BLUE SHIELD | Admitting: Psychology

## 2018-07-08 DIAGNOSIS — F3341 Major depressive disorder, recurrent, in partial remission: Secondary | ICD-10-CM | POA: Diagnosis not present

## 2018-07-22 ENCOUNTER — Encounter (HOSPITAL_COMMUNITY)
Admission: RE | Admit: 2018-07-22 | Discharge: 2018-07-22 | Disposition: A | Discharge status: Home / Self Care | Payer: BLUE CROSS/BLUE SHIELD | Source: Ambulatory Visit | Attending: Gastroenterology | Admitting: Gastroenterology

## 2018-07-22 ENCOUNTER — Encounter (HOSPITAL_COMMUNITY): Payer: Self-pay

## 2018-07-22 DIAGNOSIS — R1011 Right upper quadrant pain: Secondary | ICD-10-CM | POA: Diagnosis not present

## 2018-07-22 MED ORDER — TECHNETIUM TC 99M MEBROFENIN IV KIT
5.0800 | PACK | Freq: Once | INTRAVENOUS | Status: AC | PRN
  Administered 2018-07-22: 5.08 via INTRAVENOUS

## 2018-07-25 ENCOUNTER — Ambulatory Visit (INDEPENDENT_AMBULATORY_CARE_PROVIDER_SITE_OTHER): Payer: BLUE CROSS/BLUE SHIELD | Admitting: Psychology

## 2018-07-25 DIAGNOSIS — F3342 Major depressive disorder, recurrent, in full remission: Secondary | ICD-10-CM

## 2018-08-01 ENCOUNTER — Telehealth: Payer: Self-pay | Admitting: Family Medicine

## 2018-08-01 DIAGNOSIS — B029 Zoster without complications: Secondary | ICD-10-CM | POA: Diagnosis not present

## 2018-08-01 NOTE — Telephone Encounter (Signed)
Dr. Fry please advise. Thanks  

## 2018-08-01 NOTE — Telephone Encounter (Signed)
Copied from [place] [id number]. Topic: General - Inquiry >> Aug 01, 2018  2:40 PM Jill Mcintyre wrote: Reason for CRM: Pt saw Dr. Ronnald Ramp at [facility] today and has full blown shingles on head and face. She was prescribed tramadol and valtrex 1075m. Pt was also advised to take tylenol. Pt is stating she still has to work and asking Dr. FBarbie Bannerthoughts on Ketamine treatment. Please call to advise.

## 2018-08-02 NOTE — Telephone Encounter (Signed)
I have no idea what she is taking about. I have never used ketamine

## 2018-08-03 ENCOUNTER — Ambulatory Visit: Payer: BLUE CROSS/BLUE SHIELD | Admitting: Family Medicine

## 2018-08-03 ENCOUNTER — Encounter: Payer: Self-pay | Admitting: Family Medicine

## 2018-08-03 VITALS — BP 112/68 | HR 80 | Temp 98.6°F | Ht 68.0 in | Wt 142.2 lb

## 2018-08-03 DIAGNOSIS — B0230 Zoster ocular disease, unspecified: Secondary | ICD-10-CM | POA: Diagnosis not present

## 2018-08-03 MED ORDER — METHYLPREDNISOLONE ACETATE 80 MG/ML IJ SUSP
80.0000 mg | Freq: Once | INTRAMUSCULAR | Status: AC
  Administered 2018-08-03: 80 mg via INTRAMUSCULAR

## 2018-08-03 MED ORDER — METHYLPREDNISOLONE ACETATE 80 MG/ML IJ SUSP
40.0000 mg | Freq: Once | INTRAMUSCULAR | Status: AC
  Administered 2018-08-03: 40 mg via INTRAMUSCULAR

## 2018-08-03 MED ORDER — METHYLPREDNISOLONE 4 MG PO TBPK: ORAL_TABLET | ORAL | 0 refills | Status: DC

## 2018-08-03 MED ORDER — METHYLPREDNISOLONE ACETATE 80 MG/ML IJ SUSP: 120.0000 mg | Freq: Once | INTRAMUSCULAR | Status: DC

## 2018-08-03 NOTE — Telephone Encounter (Signed)
Patient was seen in the office today

## 2018-08-03 NOTE — Progress Notes (Signed)
   Subjective:    Patient ID: Jill Mcintyre, female    DOB: Jun 05, 1961, 58 y.o.   MRN: [id number]  HPI Here for shingles on the face. About 10 days ago she started to feel a burning pain in the left forehead and above the left eye. This pain worsened and then about 7 days ago she noticed red blisters in this area. The blisters spread and she recognized it to be shingles (she had this before on her back 5 years ago). She saw Dr. Jarome Matin 3 days ago and he started her on a week of Valtrex and Tramadol for pain. She feels pain in the left eye today but her vision is not blurred.   Review of Systems  Constitutional: Negative.   HENT: Positive for facial swelling.   Eyes: Positive for pain. Negative for photophobia and visual disturbance.  Respiratory: Negative.   Skin: Positive for rash.       Objective:   Physical Exam HENT:     Right Ear: Tympanic membrane and ear canal normal.     Left Ear: Tympanic membrane and ear canal normal.     Nose: Nose normal.     Mouth/Throat:     Pharynx: Oropharynx is clear.  Eyes:     Conjunctiva/sclera: Conjunctivae normal.     Pupils: Pupils are equal, round, and reactive to light.  Pulmonary:     Effort: Pulmonary effort is normal.     Breath sounds: Normal breath sounds.  Lymphadenopathy:     Cervical: No cervical adenopathy.  Skin:    Comments: Diffuse red vesicles and papules on the left forehead, left cheek, left upper and lower eyelids, and left temporal scalp   Neurological:     Mental Status: She is alert.           Assessment & Plan:  Facial shingles. We will give her a DepoMedrol shot today for inflammation, and she will start a Medrol dose pack tomorrow. Continue the Valtrex. I cam concerned about possible ocular involvement, so we will refer her to Ophthalmology to be seen today.  Alysia Penna, MD

## 2018-08-09 ENCOUNTER — Ambulatory Visit: Payer: BLUE CROSS/BLUE SHIELD | Admitting: Psychology

## 2018-08-09 DIAGNOSIS — F331 Major depressive disorder, recurrent, moderate: Secondary | ICD-10-CM

## 2018-08-17 DIAGNOSIS — F3181 Bipolar II disorder: Secondary | ICD-10-CM | POA: Diagnosis not present

## 2018-08-18 ENCOUNTER — Ambulatory Visit: Payer: BLUE CROSS/BLUE SHIELD | Admitting: Psychology

## 2018-08-18 DIAGNOSIS — B0230 Zoster ocular disease, unspecified: Secondary | ICD-10-CM | POA: Diagnosis not present

## 2018-08-19 ENCOUNTER — Telehealth: Payer: Self-pay | Admitting: *Deleted

## 2018-08-19 NOTE — Telephone Encounter (Signed)
Copied from [place] [phone]. Topic: General - Other >> Aug 19, 2018  9:25 AM Carolyn Stare wrote:  Pt said she is still having nerve pain and is asking for a refill on prednisone unless he wants to RX something   [facility]

## 2018-08-22 NOTE — Telephone Encounter (Signed)
We will start her on Gabapentin until this clears up. Start with 100 mg TID, call in #90 with 2 rf. If she is not feeling better after 7 days, we will increase this to 300 mg TID

## 2018-08-22 NOTE — Telephone Encounter (Signed)
Dr. Fry please advise. Thanks  

## 2018-08-24 MED ORDER — GABAPENTIN 100 MG PO CAPS: 100.0000 mg | ORAL_CAPSULE | Freq: Three times a day (TID) | ORAL | 2 refills | Status: DC

## 2018-08-24 NOTE — Telephone Encounter (Signed)
Called and spoke with pt and she is aware of rx that has been sent to the pharmacy.

## 2018-08-29 ENCOUNTER — Ambulatory Visit (INDEPENDENT_AMBULATORY_CARE_PROVIDER_SITE_OTHER): Payer: BLUE CROSS/BLUE SHIELD | Admitting: Psychology

## 2018-08-29 DIAGNOSIS — F312 Bipolar disorder, current episode manic severe with psychotic features: Secondary | ICD-10-CM

## 2018-08-30 ENCOUNTER — Ambulatory Visit: Payer: BLUE CROSS/BLUE SHIELD | Admitting: Psychology

## 2018-08-31 DIAGNOSIS — D485 Neoplasm of uncertain behavior of skin: Secondary | ICD-10-CM | POA: Diagnosis not present

## 2018-08-31 DIAGNOSIS — Z8619 Personal history of other infectious and parasitic diseases: Secondary | ICD-10-CM | POA: Diagnosis not present

## 2018-08-31 DIAGNOSIS — H0231 Blepharochalasis right upper eyelid: Secondary | ICD-10-CM | POA: Diagnosis not present

## 2018-08-31 DIAGNOSIS — H0232 Blepharochalasis right lower eyelid: Secondary | ICD-10-CM | POA: Diagnosis not present

## 2018-08-31 DIAGNOSIS — D210 Benign neoplasm of connective and other soft tissue of head, face and neck: Secondary | ICD-10-CM | POA: Diagnosis not present

## 2018-09-01 ENCOUNTER — Ambulatory Visit (INDEPENDENT_AMBULATORY_CARE_PROVIDER_SITE_OTHER): Payer: BLUE CROSS/BLUE SHIELD | Admitting: Obstetrics & Gynecology

## 2018-09-01 ENCOUNTER — Other Ambulatory Visit (HOSPITAL_COMMUNITY)
Admission: RE | Admit: 2018-09-01 | Discharge: 2018-09-01 | Disposition: A | Discharge status: Home / Self Care | Payer: BLUE CROSS/BLUE SHIELD | Source: Ambulatory Visit | Attending: Obstetrics & Gynecology | Admitting: Obstetrics & Gynecology

## 2018-09-01 ENCOUNTER — Encounter (INDEPENDENT_AMBULATORY_CARE_PROVIDER_SITE_OTHER): Payer: Self-pay | Admitting: Orthopedic Surgery

## 2018-09-01 ENCOUNTER — Other Ambulatory Visit: Payer: Self-pay

## 2018-09-01 ENCOUNTER — Encounter: Payer: Self-pay | Admitting: Obstetrics & Gynecology

## 2018-09-01 VITALS — BP 122/76 | HR 68 | Resp 16 | Ht 68.25 in | Wt 145.6 lb

## 2018-09-01 DIAGNOSIS — Z202 Contact with and (suspected) exposure to infections with a predominantly sexual mode of transmission: Secondary | ICD-10-CM

## 2018-09-01 DIAGNOSIS — E2839 Other primary ovarian failure: Secondary | ICD-10-CM

## 2018-09-01 DIAGNOSIS — Z01419 Encounter for gynecological examination (general) (routine) without abnormal findings: Secondary | ICD-10-CM | POA: Diagnosis not present

## 2018-09-01 DIAGNOSIS — Z124 Encounter for screening for malignant neoplasm of cervix: Secondary | ICD-10-CM | POA: Insufficient documentation

## 2018-09-01 LAB — HM PAP SMEAR

## 2018-09-01 MED ORDER — PROGESTERONE MICRONIZED 200 MG PO CAPS: ORAL_CAPSULE | ORAL | 4 refills | Status: DC

## 2018-09-01 MED ORDER — ESTRADIOL 0.075 MG/24HR TD PTTW: 1.0000 | MEDICATED_PATCH | TRANSDERMAL | 4 refills | Status: DC

## 2018-09-01 NOTE — Progress Notes (Signed)
58 y.o. [id number] Legally Separated White or Caucasian female here for annual exam.  Has been separated about a year.  Still seeing her husband.  Has purchased a condo.  Really happy about this.  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  Reports phone has been off the hook this week.    Continues to see therapist and psychiatrist, Dr. Pauline Good.  Did neuropsychological testing last year due to concerns about memory.  This was all normal.  Increased estrogen dosage in Jan.  Had worse acne.  Back down to lower dosage.  Did have menstrual cycle like bleeding for two months.  This seems to have resolved.  No intermenstrual bleeding or spotting.  Having a lot of hot flashes with the lower dosage of HRT.  Was given gabapentin to take after having shingles in late January.  Never took it due to side effects.  Still having some issues with nerve pain.  D/w pt use for both post-shingles neuralgia and hot flashes.  Dosing discussed.  She would like to consider using this.  Has 160m capsules.    Patient's last menstrual period was 09/14/2014.          Sexually active: Yes.    The current method of family planning is post menopausal status.    Exercising: Yes.     Smoker:  Former smoker  Health Maintenance: Pap:  03/27/15 Neg. HR HPV:neg History of abnormal Pap:  yes MMG:  03/18/18 Diagnostic Left BIRADS1:Neg. F/u 1 year  Colonoscopy:  2015 f/u 5 years  BMD:   2007 normal TDaP:  2015 Pneumonia vaccine(s):  Has received one Shingrix:   Reviewed with pt today. Hep C testing: 07/06/16 neg Screening Labs: with Dr. FSarajane Jews  reports that she has quit smoking. She started smoking about 35 years ago. She has never used smokeless tobacco. She reports current alcohol use. She reports that she does not use drugs.  Past Medical History:  Diagnosis Date  . Abnormal Pap smear of cervix 2013   ASCUS  . Acne    sees Dr. DJarome Matinfor skin checks and acne  . Anemia   . Anxiety   . [place] 1 [place] ([place]   .  [place] depression ([place]    sees Dr MGala Murdochat [facility] and sees LTrey Paulafor  therapy  . Constipation   . GERD (gastroesophageal reflux disease)   . Herpes simplex   . IBS (irritable bowel syndrome)   . Insomnia   . Migraines   . Pneumonia 2019    Past Surgical History:  Procedure Laterality Date  . BASAL CELL CARCINOMA EXCISION     Dr Amy JMartinique . BREAST ENHANCEMENT SURGERY    . CESAREAN SECTION  1996  . COLONOSCOPY  2015   per Dr. MCollene Mares clear, repeat in 10 yrs   . COLPOSCOPY  10/07/11   CIN I  . ESOPHAGOGASTRODUODENOSCOPY  09/07/2006   Dr JArdis Hughs   Current Outpatient Medications  Medication Sig Dispense Refill  . Biotin (BIOTIN 5000) 5 MG CAPS Take 1,000 mg by mouth daily. Reported on 12/03/2015    . clobetasol (TEMOVATE) 0.05 % external solution Apply 1 application topically 2 (two) times daily. 50 mL 2  . estradiol (VIVELLE-DOT) 0.075 MG/24HR Place 1 patch onto the skin 2 (two) times a week. 24 patch 4  . ibuprofen (ADVIL,MOTRIN) 800 MG tablet TAKE 1 TABLET (800 MG TOTAL) BY MOUTH EVERY 8 (EIGHT) HOURS AS NEEDED FOR MODERATE PAIN.  60 tablet 5  . Ibuprofen-Famotidine (DUEXIS) 800-26.6 MG TABS 1 po q d to bid prn 90 tablet 0  . lamoTRIgine (LAMICTAL) 100 MG tablet Take 150 mg by mouth daily.     . Multiple Vitamin (MULTI-VITAMIN PO) Take by mouth daily.    . progesterone (PROMETRIUM) 200 MG capsule TAKE 1 CAPSULE BY MOUTH EVERYDAY AT BEDTIME 30 capsule 6  . sertraline (ZOLOFT) 100 MG tablet     . traMADol (ULTRAM) 50 MG tablet TAKE 1/2 TABLET BY MOUTH EVERY 8 HOURS AS NEEDED 60 tablet 5  . traZODone (DESYREL) 50 MG tablet TAKE 1-2 TABLET AT BEDTIME AS NEEDED FOR SLEEP  2  . valACYclovir (VALTREX) 500 MG tablet TAKE 1 TABLET BY MOUTH EVERY DAY 90 tablet 1   Current Facility-Administered Medications  Medication Dose Route Frequency Provider Last Rate Last Dose  . methylPREDNISolone acetate (DEPO-MEDROL) injection 120 mg  120 mg Intramuscular Once Laurey Morale, MD        Family History  Problem Relation Age of Onset  . Cancer Mother   . Alcohol abuse Other   . Arthritis Other   . Cancer Other        breast, ovrarian, uterine    Review of Systems  All other systems reviewed and are negative.   Exam:   BP 122/76   Pulse 68   Resp 16   Ht 5' 8.25" (1.734 m)   Wt 145 lb 9.6 oz (66 kg)   LMP 09/14/2014   BMI 21.98 kg/m   Height:   Height: 5' 8.25" (173.4 cm)  Ht Readings from Last 3 Encounters:  09/01/18 5' 8.25" (1.734 m)  08/03/18 5' 8"  (1.727 m)  06/10/18 5' 8"  (1.727 m)    General appearance: alert, cooperative and appears stated age Head: Normocephalic, without obvious abnormality, atraumatic Neck: no adenopathy, supple, symmetrical, trachea midline and thyroid normal to inspection and palpation Lungs: clear to auscultation bilaterally Breasts: normal appearance, no masses or tenderness, bilateral implants present Heart: regular rate and rhythm Abdomen: soft, non-tender; bowel sounds normal; no masses,  no organomegaly Extremities: extremities normal, atraumatic, no cyanosis or edema Skin: Skin color, texture, turgor normal. No rashes or lesions Lymph nodes: Cervical, supraclavicular, and axillary nodes normal. No abnormal inguinal nodes palpated Neurologic: Grossly normal   Pelvic: External genitalia:  no lesions              Urethra:  normal appearing urethra with no masses, tenderness or lesions              Bartholins and Skenes: normal                 Vagina: normal appearing vagina with normal color and discharge, no lesions              Cervix: no lesions              Pap taken: Yes.   Bimanual Exam:  Uterus:  normal size, contour, position, consistency, mobility, non-tender              Adnexa: normal adnexa and no mass, fullness, tenderness               Rectovaginal: Confirms               Anus:  normal sphincter tone, no lesions  Chaperone was present for exam.  A:  Well Woman with normal  exam PMP, no HRT H/O bipolar d/o H/o CIN with subsequent neg  pap and neg HR HPV 2016 H/O HSV Shingles earlier this year  P:   Mammogram reviewed.  Doing yearly Pap and HR HPV with GC/Chl, trich testing obtained today Will try gabapentin 162m nightly x 7 nights, then increase to 2049mnightly and then 30053mightly if needed Would like to proceed with BMD Colonoscopy UTD return annually or prn

## 2018-09-05 LAB — CYTOLOGY - PAP
Chlamydia: NEGATIVE
DIAGNOSIS: NEGATIVE
HPV: NOT DETECTED
NEISSERIA GONORRHEA: NEGATIVE
Trichomonas: NEGATIVE

## 2018-09-06 ENCOUNTER — Encounter: Payer: Self-pay | Admitting: Psychology

## 2018-09-12 ENCOUNTER — Other Ambulatory Visit: Payer: Self-pay | Admitting: Obstetrics & Gynecology

## 2018-09-13 ENCOUNTER — Telehealth (INDEPENDENT_AMBULATORY_CARE_PROVIDER_SITE_OTHER): Payer: Self-pay | Admitting: Orthopedic Surgery

## 2018-09-13 MED ORDER — LIDOCAINE 5 % EX PTCH: 1.0000 | MEDICATED_PATCH | CUTANEOUS | 0 refills | Status: DC

## 2018-09-13 NOTE — Telephone Encounter (Signed)
Submitted,advised pt done

## 2018-09-13 NOTE — Telephone Encounter (Signed)
Please advise. Thanks.  

## 2018-09-13 NOTE — Telephone Encounter (Signed)
Ok to rf? 

## 2018-09-13 NOTE — Telephone Encounter (Signed)
Patient called stated that insurance will not cover patches and wanted to know if he could call her something else in for pain?  Please call patient to advise.

## 2018-09-13 NOTE — Telephone Encounter (Signed)
ok 

## 2018-09-13 NOTE — Telephone Encounter (Signed)
Patient called requesting an Rx be sent in to her pharmacy for the Lidocaine patches.  [facility] [address].  CB#[phone].  Thank you.

## 2018-09-14 MED ORDER — DICLOFENAC EPOLAMINE 1.3 % TD PTCH: MEDICATED_PATCH | TRANSDERMAL | 0 refills | Status: DC

## 2018-09-14 NOTE — Telephone Encounter (Signed)
We will try Voltaren patch instead see if that helps have him cut about 1 inch square put on the [facility] joint thanks

## 2018-09-14 NOTE — Telephone Encounter (Signed)
Submitted. Advised done.

## 2018-09-20 ENCOUNTER — Telehealth: Payer: BLUE CROSS/BLUE SHIELD | Admitting: Physician Assistant

## 2018-09-20 ENCOUNTER — Ambulatory Visit: Payer: BLUE CROSS/BLUE SHIELD | Admitting: Family Medicine

## 2018-09-20 ENCOUNTER — Other Ambulatory Visit: Payer: Self-pay

## 2018-09-20 ENCOUNTER — Encounter: Payer: Self-pay | Admitting: Family Medicine

## 2018-09-20 ENCOUNTER — Telehealth: Payer: Self-pay | Admitting: Family Medicine

## 2018-09-20 VITALS — BP 110/68 | HR 86 | Temp 99.0°F | Wt 146.0 lb

## 2018-09-20 DIAGNOSIS — R05 Cough: Secondary | ICD-10-CM

## 2018-09-20 DIAGNOSIS — J209 Acute bronchitis, unspecified: Secondary | ICD-10-CM | POA: Diagnosis not present

## 2018-09-20 DIAGNOSIS — R058 Other specified cough: Secondary | ICD-10-CM

## 2018-09-20 MED ORDER — AZITHROMYCIN 250 MG PO TABS: ORAL_TABLET | ORAL | 0 refills | Status: DC

## 2018-09-20 MED ORDER — ALBUTEROL SULFATE HFA 108 (90 BASE) MCG/ACT IN AERS: 2.0000 | INHALATION_SPRAY | RESPIRATORY_TRACT | 2 refills | Status: DC | PRN

## 2018-09-20 NOTE — Telephone Encounter (Signed)
Pt called the office and stated that she did an evisit this morning through [place] and they advised her that it sounds like allergies but advised her to see her doctor to have them listen to her lungs.    Pt stated that she felt wheezy this morning and has taken zyrec and mucinex d already today.  She has a lot of PND at this time and can feel this running down her throat.  She is just concerned about the wheezing.    Pt has not been out of the country, around anyone sick, no fever or any other symptoms.  [name] please advise. Thanks

## 2018-09-20 NOTE — Telephone Encounter (Signed)
Copied from [place] [id number]. Topic: Quick Communication - See Telephone Encounter >> Sep 20, 2018  3:48 PM Vernona Rieger wrote: CRM for notification. See Telephone encounter for: 09/20/18.  [facility] for her azithromycin (ZITHROMAX Z-PAK) 250 MG tablet before it can be filled. Please advise [phone]

## 2018-09-20 NOTE — Progress Notes (Signed)
   Subjective:    Patient ID: Jill Mcintyre, female    DOB: Jul 31, 1960, 58 y.o.   MRN: [id number]  HPI Here for 5 days of stuffy head, PND, chest congestion, wheezing, and coughing up green sputum. No fever or chest pain. No body aches. No NVD. Using Zyrtec and Mucinex D. She had an evisit this morning and they suggested she see [place].    Review of Systems  Constitutional: Negative.   HENT: Positive for congestion and postnasal drip. Negative for sore throat.   Eyes: Negative.   Respiratory: Positive for cough, chest tightness, shortness of breath and wheezing.   Cardiovascular: Negative.        Objective:   Physical Exam Constitutional:      Appearance: Normal appearance. She is not ill-appearing.  HENT:     Right Ear: Tympanic membrane and ear canal normal.     Left Ear: Tympanic membrane and ear canal normal.     Nose: Nose normal.     Mouth/Throat:     Pharynx: Oropharynx is clear.  Eyes:     Conjunctiva/sclera: Conjunctivae normal.  Pulmonary:     Effort: Pulmonary effort is normal. No respiratory distress.     Breath sounds: Wheezing and rhonchi present. No rales.  Lymphadenopathy:     Cervical: No cervical adenopathy.  Neurological:     Mental Status: She is alert.           Assessment & Plan:  Bronchitis, treat with a Zpack. Use albuterol inhaler prn.  Alysia Penna, MD

## 2018-09-20 NOTE — Progress Notes (Signed)
We are sorry that you are not feeling well.  Here is how we plan to help!  Based on your presentation I believe you most likely have A cough due to allergies.  I recommend that you start the an over-the counter-allergy medication such as Claritin 10 mg or Zyrtec 10 mg daily.     In addition you should continue your over the counter medications.   Providers prescribe antibiotics to treat infections caused by bacteria. Antibiotics are very powerful in treating bacterial infections when they are used properly. To maintain their effectiveness, they should be used only when necessary. Overuse of antibiotics has resulted in the development of superbugs that are resistant to treatment!   After careful review of your answers, I would not recommend an antibiotic for your condition. Antibiotics are not effective against viruses and therefore should not be used to treat them. Common examples of infections caused by viruses include colds and flu      From your responses in the eVisit questionnaire you describe inflammation in the upper respiratory tract which is causing a significant cough.  This is commonly called Bronchitis and has four common causes:   Allergies Viral Infections Acid Reflux Bacterial Infection Allergies, viruses and acid reflux are treated by controlling symptoms or eliminating the cause. An example might be a cough caused by taking certain blood pressure medications. You stop the cough by changing the medication. Another example might be a cough caused by acid reflux. Controlling the reflux helps control the cough.  USE OF BRONCHODILATOR ("RESCUE") INHALERS: There is a risk from using your bronchodilator too frequently.  The risk is that over-reliance on a medication which only relaxes the muscles surrounding the breathing tubes can reduce the effectiveness of medications prescribed to reduce swelling and congestion of the tubes themselves.  Although you feel brief relief from the  bronchodilator inhaler, your asthma may actually be worsening with the tubes becoming more swollen and filled with mucus.  This can delay other crucial treatments, such as oral steroid medications. If you need to use a bronchodilator inhaler daily, several times per day, you should discuss this with your provider.  There are probably better treatments that could be used to keep your asthma under control.     HOME CARE Only take medications as instructed by your medical team. Complete the entire course of an antibiotic. Drink plenty of fluids and get plenty of rest. Avoid close contacts especially the very young and the elderly Cover your mouth if you cough or cough into your sleeve. Always remember to wash your hands A steam or ultrasonic humidifier can help congestion.   GET HELP RIGHT AWAY IF: You develop worsening fever. You become short of breath You cough up blood. Your symptoms persist after you have completed your treatment plan MAKE SURE YOU  Understand these instructions. Will watch your condition. Will get help right away if you are not doing well or get worse.  Your e-visit answers were reviewed by a board certified advanced clinical practitioner to complete your personal care plan.  Depending on the condition, your plan could have included both over the counter or prescription medications. If there is a problem please reply once you have received a response from your provider. Your safety is important to [place].  If you have drug allergies check your prescription carefully.    You can use MyChart to ask questions about today's visit, request a non-urgent call back, or ask for a work or school excuse for  24 hours related to this e-Visit. If it has been greater than 24 hours you will need to follow up with your provider, or enter a new e-Visit to address those concerns. You will get an e-mail in the next two days asking about your experience.  I hope that your e-visit has been  valuable and will speed your recovery. Thank you for using e-visits.   ===View-only below this line===   ----- Message -----    From: Jill Mcintyre    Sent: 09/20/2018  8:15 AM EDT      To: E-Visit Mailing List Subject: E-Visit Submission: Upper Respiratory Infection  E-Visit Submission: Upper Respiratory Infection --------------------------------  Question: Which of the following are you experiencing? Answer:   Cough            Post-nasal drip (mucus running down back of throat)  Question: How long have you been having these symptoms? Answer:   5 days  Question: How long have you been coughing? Answer:   5 days  Question: How would you describe the cough? Answer:   A cough from congested lungs  Question: Does the cough prevent you from sleeping at night? Answer:   No  Question: How often are you coughing? Answer:   None of the above  Question: Do you have a fever? Answer:   No, I do not have a fever  Question: Are you in close contact with anyone who has similar symptoms ? Answer:   No  Question: Are you treated for any of the following conditions: Asthma, COPD, Diabetes, Renal Failure (on Dialysis), AIDS, any Neuromuscular disease that effects the clearing of secretions, Heart Failure, or Heart Disease? Answer:   No  Question: Are you taking any over the counter medications for your symptoms? Answer:   Yes  Question: Please list the name(s) of the over the counter medications and whether they are helping or not: Answer:   Mucinex D Robitussin Tylenol  Question: Please list your medication allergies that you may have ? (If 'none' , please list as 'none') Answer:   Cipro & Sulfa  Question: Please list any additional comments  Answer:   Requested z-pack last Thursday thru my pharmacy. They did submit request to Dr. Alysia Penna, my pcp. Have not heard back  A total of 5-10 minutes was spent evaluating this patients questionnaire and formulating a plan of  care.

## 2018-09-20 NOTE — Telephone Encounter (Signed)
Make her an OV so I can listen to her today

## 2018-09-20 NOTE — Telephone Encounter (Signed)
Dx Bronchitis  [facility], made aware.  Nothing further needed.

## 2018-09-20 NOTE — Telephone Encounter (Signed)
Called and spoke with pt and she is aware of app with SF today at 230--she will come to the office

## 2018-09-21 ENCOUNTER — Other Ambulatory Visit: Payer: Self-pay

## 2018-09-21 ENCOUNTER — Ambulatory Visit (INDEPENDENT_AMBULATORY_CARE_PROVIDER_SITE_OTHER): Payer: BLUE CROSS/BLUE SHIELD | Admitting: Orthopedic Surgery

## 2018-09-21 ENCOUNTER — Encounter (INDEPENDENT_AMBULATORY_CARE_PROVIDER_SITE_OTHER): Payer: Self-pay | Admitting: Orthopedic Surgery

## 2018-09-21 DIAGNOSIS — M19011 Primary osteoarthritis, right shoulder: Secondary | ICD-10-CM | POA: Diagnosis not present

## 2018-09-21 MED ORDER — METHYLPREDNISOLONE ACETATE 40 MG/ML IJ SUSP
13.3300 mg | INTRAMUSCULAR | Status: AC | PRN
  Administered 2018-09-21: 10:00:00 13.33 mg via INTRA_ARTICULAR

## 2018-09-21 MED ORDER — BUPIVACAINE HCL 0.25 % IJ SOLN
0.6600 mL | INTRAMUSCULAR | Status: AC | PRN
  Administered 2018-09-21: 10:00:00 .66 mL via INTRA_ARTICULAR

## 2018-09-21 MED ORDER — LIDOCAINE HCL 1 % IJ SOLN
3.0000 mL | INTRAMUSCULAR | Status: AC | PRN
  Administered 2018-09-21: 3 mL

## 2018-09-21 NOTE — Progress Notes (Signed)
Office Visit Note   Patient: Jill Mcintyre           Date of Birth: 1960/08/20           MRN: [id number] Visit Date: 09/21/2018 Requested by: Laurey Morale, MD [place], [place] [id number] PCP: Laurey Morale, MD  Subjective: Chief Complaint  Patient presents with  . Right Shoulder - Pain    HPI: Jill Mcintyre is a patient with known right shoulder AC joint arthritis.  She presents now with recurrent pain.  Had an injection in November and she did well with that.  She has not really reinjured the shoulder.  She is been managing conservatively but is reporting pain on a daily basis as well as pain which interferes with her activities of daily living              ROS: All systems reviewed are negative as they relate to the chief complaint within the history of present illness.  Patient denies  fevers or chills.   Assessment & Plan: Visit Diagnoses:  1. Arthritis of right acromioclavicular joint     Plan: Impression is right shoulder AC joint arthritis.  Plan is ultrasound-guided injection.  This will be the second injection.  Plan surgical intervention if this does not take care of the problem.  We discussed this at the last clinic visit.  Follow-Up Instructions: Return if symptoms worsen or fail to improve.   Orders:  No orders of the defined types were placed in this encounter.  No orders of the defined types were placed in this encounter.     Procedures: Medium Joint Inj: R acromioclavicular on 09/21/2018 9:30 AM Indications: pain and diagnostic evaluation Details: 27 G 1.5 in needle, ultrasound-guided superior approach Medications: 13.33 mg methylPREDNISolone acetate 40 MG/ML; 0.66 mL bupivacaine 0.25 %; 3 mL lidocaine 1 % Outcome: tolerated well, no immediate complications Procedure, treatment alternatives, risks and benefits explained, specific risks discussed. Consent was given by the patient. Immediately prior to procedure a time out was called to  verify the correct patient, procedure, equipment, support staff and site/side marked as required. Patient was prepped and draped in the usual sterile fashion.       Clinical Data: No additional findings.  Objective: Vital Signs: LMP 09/14/2014   Physical Exam:   Constitutional: Patient appears well-developed HEENT:  Head: Normocephalic Eyes:EOM are normal Neck: Normal range of motion Cardiovascular: Normal rate Pulmonary/chest: Effort normal Neurologic: Patient is alert Skin: Skin is warm Psychiatric: Patient has normal mood and affect    Ortho Exam: Ortho demonstrates tenderness at the [facility] joint but no restricted passive range of motion.  Rotator cuff strength is intact.  There is slight spurring more noticeable on the right than the left.  The rest of her exam is unchanged  Specialty Comments:  No specialty comments available.  Imaging: No results found.   PMFS History: Patient Active Problem List   Diagnosis Date Noted  . Psoriasis of scalp 02/18/2018  . AC joint arthropathy 11/18/2017  . OSA on CPAP 10/21/2016  . Postmenopausal HRT (hormone replacement therapy) 12/04/2015  . Psychosocial stressors 12/04/2015  . Bipolar disorder ([place]) 12/20/2014  . Celiac disease 10/07/2014  . OLECRANON BURSITIS 04/02/2009  . ADHD 12/20/2008  . URTICARIA 10/26/2008  . Anxiety state 03/29/2007  . DEPRESSION 03/29/2007  . ASTHMA 03/29/2007  . GERD 03/29/2007   Past Medical History:  Diagnosis Date  . Abnormal Pap smear of cervix 2013   ASCUS  .  Acne    sees Dr. Jarome Matin for skin checks and acne  . Anemia   . Anxiety   . Bipolar 1 disorder ([place])   . Bipolar depression ([place])    sees Dr Gala Murdoch [facility] and sees Trey Paula for  therapy  . Constipation   . GERD (gastroesophageal reflux disease)   . Herpes simplex   . IBS (irritable bowel syndrome)   . Insomnia   . Migraines   . Pneumonia 2019    Family History  Problem Relation Age of  Onset  . Cancer Mother   . Alcohol abuse Other   . Arthritis Other   . Cancer Other        breast, ovrarian, uterine    Past Surgical History:  Procedure Laterality Date  . BASAL CELL CARCINOMA EXCISION     Dr Amy Martinique  . BREAST ENHANCEMENT SURGERY    . CESAREAN SECTION  1996  . COLONOSCOPY  2015   per Dr. Collene Mares, clear, repeat in 10 yrs   . COLPOSCOPY  10/07/11   CIN I  . ESOPHAGOGASTRODUODENOSCOPY  09/07/2006   Dr Ardis Hughs   Social History   Occupational History  . Not on file  Tobacco Use  . Smoking status: Former Smoker    Start date: 10/07/1982  . Smokeless tobacco: Never Used  Substance and Sexual Activity  . Alcohol use: Yes    Alcohol/week: 0.0 standard drinks    Comment: occ  . Drug use: No  . Sexual activity: Yes    Partners: Male    Birth control/protection: Post-menopausal    Comment: partner vasectomy

## 2018-10-05 ENCOUNTER — Ambulatory Visit (INDEPENDENT_AMBULATORY_CARE_PROVIDER_SITE_OTHER): Payer: BLUE CROSS/BLUE SHIELD | Admitting: Family Medicine

## 2018-10-05 ENCOUNTER — Encounter: Payer: Self-pay | Admitting: Family Medicine

## 2018-10-05 ENCOUNTER — Other Ambulatory Visit: Payer: Self-pay

## 2018-10-05 DIAGNOSIS — J019 Acute sinusitis, unspecified: Secondary | ICD-10-CM | POA: Diagnosis not present

## 2018-10-05 MED ORDER — METHYLPREDNISOLONE 4 MG PO TBPK: ORAL_TABLET | ORAL | 0 refills | Status: DC

## 2018-10-05 MED ORDER — DOXYCYCLINE HYCLATE 100 MG PO CAPS: 100.0000 mg | ORAL_CAPSULE | Freq: Two times a day (BID) | ORAL | 0 refills | Status: AC

## 2018-10-05 NOTE — Progress Notes (Signed)
Subjective:    Patient ID: Jill Mcintyre, female    DOB: 28-Jan-1961, 58 y.o.   MRN: [id number]  HPI Virtual Visit via Video Note  I connected with the patient on 10/05/18 at 10:30 AM EDT by a video enabled telemedicine application and verified that I am speaking with the correct person using two identifiers.  Location patient: home Location provider:work or home office Persons participating in the virtual visit: patient, provider  I discussed the limitations of evaluation and management by telemedicine and the availability of in person appointments. The patient expressed understanding and agreed to proceed.   HPI: Here for continued coughing and now with head congestion and bilateral ear pain. We saw her on 09-20-18 for a cough that produced green sputum, and she was given a Zpack. Using Robitussin and her inhaler as well. The cough persists, though it is now dry. She developed more head symptoms now. No fever.    ROS: See pertinent positives and negatives per HPI.  Past Medical History:  Diagnosis Date  . Abnormal Pap smear of cervix 2013   ASCUS  . Acne    sees Dr. Jarome Matin for skin checks and acne  . Anemia   . Anxiety   . Bipolar 1 disorder ([place])   . Bipolar depression ([place])    sees Dr Gala Murdoch [facility] and sees Trey Paula for  therapy  . Constipation   . GERD (gastroesophageal reflux disease)   . Herpes simplex   . IBS (irritable bowel syndrome)   . Insomnia   . Migraines   . Pneumonia 2019    Past Surgical History:  Procedure Laterality Date  . BASAL CELL CARCINOMA EXCISION     Dr Amy Martinique  . BREAST ENHANCEMENT SURGERY    . CESAREAN SECTION  1996  . COLONOSCOPY  2015   per Dr. Collene Mares, clear, repeat in 10 yrs   . COLPOSCOPY  10/07/11   CIN I  . ESOPHAGOGASTRODUODENOSCOPY  09/07/2006   Dr Ardis Hughs    Family History  Problem Relation Age of Onset  . Cancer Mother   . Alcohol abuse Other   . Arthritis Other   . Cancer Other         breast, ovrarian, uterine     Current Outpatient Medications:  .  albuterol (PROVENTIL HFA;VENTOLIN HFA) 108 (90 Base) MCG/ACT inhaler, Inhale 2 puffs into the lungs every 4 (four) hours as needed for wheezing or shortness of breath., Disp: 1 Inhaler, Rfl: 2 .  Biotin (BIOTIN 5000) 5 MG CAPS, Take 1,000 mg by mouth daily. Reported on 12/03/2015, Disp: , Rfl:  .  clobetasol (TEMOVATE) 0.05 % external solution, Apply 1 application topically 2 (two) times daily., Disp: 50 mL, Rfl: 2 .  diclofenac (FLECTOR) 1.3 % PTCH, 1 inch square put on the [facility] joint, Disp: 30 patch, Rfl: 0 .  estradiol (VIVELLE-DOT) 0.075 MG/24HR, Place 1 patch onto the skin 2 (two) times a week., Disp: 24 patch, Rfl: 4 .  ibuprofen (ADVIL,MOTRIN) 800 MG tablet, TAKE 1 TABLET (800 MG TOTAL) BY MOUTH EVERY 8 (EIGHT) HOURS AS NEEDED FOR MODERATE PAIN., Disp: 60 tablet, Rfl: 5 .  Ibuprofen-Famotidine (DUEXIS) 800-26.6 MG TABS, 1 po q d to bid prn, Disp: 90 tablet, Rfl: 0 .  lamoTRIgine (LAMICTAL) 100 MG tablet, Take 150 mg by mouth daily. , Disp: , Rfl:  .  lidocaine (LIDODERM) 5 %, Place 1 patch onto the skin daily. Remove & Discard patch within 12  hours or as directed by MD, Disp: 30 patch, Rfl: 0 .  Multiple Vitamin (MULTI-VITAMIN PO), Take by mouth daily., Disp: , Rfl:  .  progesterone (PROMETRIUM) 200 MG capsule, TAKE 1 CAPSULE BY MOUTH EVERYDAY AT BEDTIME, Disp: 90 capsule, Rfl: 4 .  sertraline (ZOLOFT) 100 MG tablet, , Disp: , Rfl:  .  traMADol (ULTRAM) 50 MG tablet, TAKE 1/2 TABLET BY MOUTH EVERY 8 HOURS AS NEEDED, Disp: 60 tablet, Rfl: 5 .  traZODone (DESYREL) 50 MG tablet, TAKE 1-2 TABLET AT BEDTIME AS NEEDED FOR SLEEP, Disp: , Rfl: 2 .  valACYclovir (VALTREX) 500 MG tablet, TAKE 1 TABLET BY MOUTH EVERY DAY, Disp: 90 tablet, Rfl: 1 .  doxycycline (VIBRAMYCIN) 100 MG capsule, Take 1 capsule (100 mg total) by mouth 2 (two) times daily for 10 days., Disp: 20 capsule, Rfl: 0 .  methylPREDNISolone (MEDROL DOSEPAK) 4 MG  TBPK tablet, As directed, Disp: 21 tablet, Rfl: 0  Current Facility-Administered Medications:  .  methylPREDNISolone acetate (DEPO-MEDROL) injection 120 mg, 120 mg, Intramuscular, Once, Laurey Morale, MD  EXAM:  VITALS per patient if applicable:  GENERAL: alert, oriented, appears well and in no acute distress  HEENT: atraumatic, conjunttiva clear, no obvious abnormalities on inspection of external nose and ears  NECK: normal movements of the head and neck  LUNGS: on inspection no signs of respiratory distress, breathing rate appears normal, no obvious gross SOB, gasping or wheezing  CV: no obvious cyanosis  MS: moves all visible extremities without noticeable abnormality  PSYCH/NEURO: pleasant and cooperative, no obvious depression or anxiety, speech and thought processing grossly intact  ASSESSMENT AND PLAN: Sinusitis, treat with Doxycycline and a Medrol dose pack. Alysia Penna, MD  Discussed the following assessment and plan:  No diagnosis found.     I discussed the assessment and treatment plan with the patient. The patient was provided an opportunity to ask questions and all were answered. The patient agreed with the plan and demonstrated an understanding of the instructions.   The patient was advised to call back or seek an in-person evaluation if the symptoms worsen or if the condition fails to improve as anticipated.     Review of Systems     Objective:   Physical Exam        Assessment & Plan:

## 2018-10-11 DIAGNOSIS — F3181 Bipolar II disorder: Secondary | ICD-10-CM | POA: Diagnosis not present

## 2018-10-13 ENCOUNTER — Ambulatory Visit (INDEPENDENT_AMBULATORY_CARE_PROVIDER_SITE_OTHER): Payer: BLUE CROSS/BLUE SHIELD | Admitting: Psychology

## 2018-10-13 DIAGNOSIS — F312 Bipolar disorder, current episode manic severe with psychotic features: Secondary | ICD-10-CM | POA: Diagnosis not present

## 2018-11-08 ENCOUNTER — Telehealth: Payer: Self-pay | Admitting: *Deleted

## 2018-11-08 NOTE — Telephone Encounter (Signed)
Dr. Fry please advise. Thanks  

## 2018-11-08 NOTE — Telephone Encounter (Signed)
Copied from [place] [id number]. Topic: Referral - Request for Referral >> Nov 08, 2018  1:00 PM Richardo Priest, [place] wrote: Has patient seen PCP for this complaint? Yes *If NO, is insurance requiring patient see PCP for this issue before PCP can refer them? Referral for which specialty: sleep study Preferred provider/office: Dr.Newman/ [address] Reason for referral: Patient states she does not sleep. Patient states she has had the issue 2 years ago. Patient states she would like to see Dr.Newman due to not remembering yesterday, so she believes it is worse.

## 2018-11-09 NOTE — Telephone Encounter (Signed)
Pt would like to change the provider she is requesting the referral for. She no longer wants Dr. Lucia Gaskins. She would like to have the referral Dr. Melida Quitter. [name] has referred her to him before. Please advise.

## 2018-11-10 ENCOUNTER — Ambulatory Visit: Payer: BLUE CROSS/BLUE SHIELD | Admitting: Psychology

## 2018-11-10 DIAGNOSIS — Z03818 Encounter for observation for suspected exposure to other biological agents ruled out: Secondary | ICD-10-CM | POA: Diagnosis not present

## 2018-11-10 NOTE — Telephone Encounter (Signed)
I will send her referral to [place] ent I Informed cherly from the [facility] of this when I spoke to  her about this patient . The patient said [facility] will ok to send to

## 2018-11-22 ENCOUNTER — Ambulatory Visit
Admission: RE | Admit: 2018-11-22 | Discharge: 2018-11-22 | Disposition: A | Discharge status: Home / Self Care | Payer: BC Managed Care – PPO | Source: Ambulatory Visit | Attending: Obstetrics & Gynecology | Admitting: Obstetrics & Gynecology

## 2018-11-22 ENCOUNTER — Other Ambulatory Visit: Payer: Self-pay

## 2018-11-22 DIAGNOSIS — E2839 Other primary ovarian failure: Secondary | ICD-10-CM

## 2018-11-22 DIAGNOSIS — Z780 Asymptomatic menopausal state: Secondary | ICD-10-CM | POA: Diagnosis not present

## 2018-11-22 DIAGNOSIS — Z13820 Encounter for screening for osteoporosis: Secondary | ICD-10-CM | POA: Diagnosis not present

## 2018-11-28 ENCOUNTER — Other Ambulatory Visit: Payer: Self-pay

## 2018-12-07 ENCOUNTER — Telehealth: Payer: Self-pay | Admitting: Obstetrics & Gynecology

## 2018-12-07 NOTE — Telephone Encounter (Signed)
Spoke with patient. Patient reports having a cycle monthly for the past 3 months, they last approximately 8 days. 16 lb weight gain and difficulty sleeping at night, only sleeping 5 hrs. Currently on estradiol patch 0.09m, uses 1/2 patch twice wkly and progesterone 200 mg days 1-15 each month. Patient request WebEx or OV with Dr. MSabra Heck would like to have thyroid checked and possibly her hormone level again. Denies pain, fever/chills. Covid 19 precautions reviewed. OV scheduled for 6/19 at 10:30am with Dr. MSabra Heck Patient verbalizes understanding.   Routing to provider for final review. Patient is agreeable to disposition. Will close encounter.

## 2018-12-07 NOTE — Telephone Encounter (Signed)
Patient would like to schedule webex appointment for a consult. States she may also need bloodwork.

## 2018-12-09 ENCOUNTER — Other Ambulatory Visit: Payer: Self-pay

## 2018-12-09 ENCOUNTER — Ambulatory Visit: Payer: BC Managed Care – PPO | Admitting: Obstetrics & Gynecology

## 2018-12-09 ENCOUNTER — Encounter: Payer: Self-pay | Admitting: Obstetrics & Gynecology

## 2018-12-09 VITALS — BP 120/56 | HR 68 | Temp 97.5°F | Ht 68.0 in | Wt 149.8 lb

## 2018-12-09 DIAGNOSIS — N950 Postmenopausal bleeding: Secondary | ICD-10-CM | POA: Diagnosis not present

## 2018-12-09 NOTE — Progress Notes (Signed)
GYNECOLOGY  VISIT  CC:   Menopause and weight gain consult   HPI: 58 y.o. G2P2002 Legally Separated White or Caucasian female here for complaint of menstrual bleeding for the last three months.  She's had an 8 day cycle every month for the last three months.  These have been full cycles that were like a regular menstrual cycle.  She is having increased sweats at night so feels hormonally all over the place.  Reports she is only sleeping about 4 hours a night although does not attribute this to night sweats.  Seems to wake up every morning at 4am and then cannot get back to sleep.  Takes trazodone for insomnia.    Has questions about her BMD.   Reviewed this with her.  I sent her a mychart message with the results.  She says her sister also looked at it and told her she was close to osteoporosis.  Advised this is incorrect and that her bone density was normal.    Asks to be tested again for Celiac disease.  Already has diagnosis via colonoscopy several years ago.  Was tested by Dr. Sharlene Motts, again, last year and testing was positive.  This is really not indicated.  Frustrated with weight.  States she's gained 15 pounds in last year.  Reviewed weight over the past three years which has not been lower then 138#.    GYNECOLOGIC HISTORY: Contraception: PMP Menopausal hormone therapy: Vivelle dot, progesterone 265m  Patient Active Problem List   Diagnosis Date Noted  . Psoriasis of scalp 02/18/2018  . AC joint arthropathy 11/18/2017  . OSA on CPAP 10/21/2016  . Postmenopausal HRT (hormone replacement therapy) 12/04/2015  . Psychosocial stressors 12/04/2015  . B[place] disorder ([place] 12/20/2014  . Celiac disease 10/07/2014  . OLECRANON BURSITIS 04/02/2009  . ADHD 12/20/2008  . URTICARIA 10/26/2008  . Anxiety state 03/29/2007  . DEPRESSION 03/29/2007  . ASTHMA 03/29/2007  . GERD 03/29/2007    Past Medical History:  Diagnosis Date  . Abnormal Pap smear of cervix 2013   ASCUS  . Acne    sees Dr. DJarome Matinfor skin checks and acne  . Anemia   . Anxiety   . Bipolar 1 disorder ([place]   . Bipolar depression ([place]    [facility] Dr MGala [facility] and sees LTrey Paulafor  therapy  . Constipation   . GERD (gastroesophageal reflux disease)   . Herpes simplex   . IBS (irritable bowel syndrome)   . Insomnia   . Migraines   . Pneumonia 2019    Past Surgical History:  Procedure Laterality Date  . BASAL CELL CARCINOMA EXCISION     Dr Amy JMartinique . BREAST ENHANCEMENT SURGERY    . CESAREAN SECTION  1996  . COLONOSCOPY  2015   per Dr. MCollene Mares clear, repeat in 10 yrs   . COLPOSCOPY  10/07/11   CIN I  . ESOPHAGOGASTRODUODENOSCOPY  09/07/2006   Dr JArdis Hughs   MEDS:   Current Outpatient Medications on File Prior to Visit  Medication Sig Dispense Refill  . ALPRAZolam (XANAX) 0.25 MG tablet     . Biotin (BIOTIN 5000) 5 MG CAPS Take 1,000 mg by mouth daily. Reported on 12/03/2015    . Calcium Carb-Cholecalciferol (CALCIUM 1000 + D PO) Take by mouth daily.    . cholecalciferol (VITAMIN D3) 25 MCG (1000 UT) tablet Take 1,000 Units by mouth daily.    . clobetasol (TEMOVATE) 0.05 % external solution  Apply 1 application topically 2 (two) times daily. 50 mL 2  . diclofenac (FLECTOR) 1.3 % PTCH 1 inch square put on the [facility] joint 30 patch 0  . estradiol (VIVELLE-DOT) 0.075 MG/24HR Place 1 patch onto the skin 2 (two) times a week. 24 patch 4  . ibuprofen (ADVIL,MOTRIN) 800 MG tablet TAKE 1 TABLET (800 MG TOTAL) BY MOUTH EVERY 8 (EIGHT) HOURS AS NEEDED FOR MODERATE PAIN. 60 tablet 5  . Ibuprofen-Famotidine (DUEXIS) 800-26.6 MG TABS 1 po q d to bid prn 90 tablet 0  . lamoTRIgine (LAMICTAL) 100 MG tablet Take 100 mg by mouth daily.     Marland Kitchen lidocaine (LIDODERM) 5 % Place 1 patch onto the skin daily. Remove & Discard patch within 12 hours or as directed by MD 30 patch 0  . Multiple Vitamin (MULTI-VITAMIN PO) Take by mouth daily.    . progesterone (PROMETRIUM) 200 MG capsule TAKE 1  CAPSULE BY MOUTH EVERYDAY AT BEDTIME 90 capsule 4  . sertraline (ZOLOFT) 100 MG tablet     . traMADol (ULTRAM) 50 MG tablet TAKE 1/2 TABLET BY MOUTH EVERY 8 HOURS AS NEEDED 60 tablet 5  . traZODone (DESYREL) 50 MG tablet TAKE 1-2 TABLET AT BEDTIME AS NEEDED FOR SLEEP  2  . valACYclovir (VALTREX) 500 MG tablet TAKE 1 TABLET BY MOUTH EVERY DAY 90 tablet 1   No current facility-administered medications on file prior to visit.     ALLERGIES: Ciprofloxacin and Sulfa antibiotics  Family History  Problem Relation Age of Onset  . Cancer Mother   . Alcohol abuse Other   . Arthritis Other   . Cancer Other        breast, ovrarian, uterine    SH:  Separated, non smoker  Review of Systems  Constitutional: Positive for unexpected weight change.  All other systems reviewed and are negative.   PHYSICAL EXAMINATION:    BP (!) 120/56   Pulse 68   Temp (!) 97.5 F (36.4 C) (Temporal)   Ht 5' 8"  (1.727 m)   Wt 149 lb 12.8 oz (67.9 kg)   LMP 09/14/2014   BMI 22.78 kg/m     General appearance: alert, cooperative and appears stated age  Assessment: PMP bleeding on HRT Celiac disease--repeat testing not indicated Night sweats  Plan: FSH, estradiol obtained Iron levels obtained TSH obtained Will likely have pt return for PUS   ~15 minutes spent with patient >50% of time was in face to face discussion of above.

## 2018-12-10 LAB — IRON,TIBC AND FERRITIN PANEL
Ferritin: 37 ng/mL (ref 15–150)
Iron Saturation: 21 % (ref 15–55)
Iron: 67 ug/dL (ref 27–159)
Total Iron Binding Capacity: 321 ug/dL (ref 250–450)
UIBC: 254 ug/dL (ref 131–425)

## 2018-12-10 LAB — FOLLICLE STIMULATING HORMONE: FSH: 60 m[IU]/mL

## 2018-12-10 LAB — TSH: TSH: 1.31 u[IU]/mL (ref 0.450–4.500)

## 2018-12-10 LAB — ESTRADIOL: Estradiol: 28.1 pg/mL

## 2018-12-12 ENCOUNTER — Telehealth: Payer: Self-pay | Admitting: Obstetrics & Gynecology

## 2018-12-12 ENCOUNTER — Other Ambulatory Visit: Payer: Self-pay | Admitting: *Deleted

## 2018-12-12 DIAGNOSIS — N950 Postmenopausal bleeding: Secondary | ICD-10-CM

## 2018-12-12 NOTE — Telephone Encounter (Signed)
Call placed to patient to convey and scheduled recommended ultrasound. Left voicemail message requesting a return call

## 2018-12-13 NOTE — Telephone Encounter (Signed)
Spoke with patient regarding benefit for recommended ultrasound. Patient understood and agreeable. Patient ready to schedule. Patient scheduled 12/29/2018 with Dr Sabra Heck. Patient offered multiple, earlier dates, but declined due to work schedule and vacation the week of December 19, 2018 through 12/23/2018. Patient aware of appointment date, arrival time and cancellation policy. No further question.   Forwarding to Dr Sabra Heck for final review. Patient is agreeable to disposition. Will close encounter

## 2018-12-19 ENCOUNTER — Telehealth: Payer: Self-pay | Admitting: *Deleted

## 2018-12-19 NOTE — Telephone Encounter (Signed)
Copied from [place] [id number]. Topic: General - Other >> Dec 15, 2018  9:25 AM Keene Breath wrote: Reason for CRM: Patient called to schedule an appt. For the Shingles vaccine.  Tried the office 2x and was cut off.  Please call patient back to schedule appt.  CB# [phone]   [name] please advise if ok to schedule the pt for the shingles vaccine.  Thanks

## 2018-12-20 ENCOUNTER — Encounter: Payer: Self-pay | Admitting: *Deleted

## 2018-12-20 NOTE — Telephone Encounter (Signed)
Please set up a nursing visit for this

## 2018-12-20 NOTE — Telephone Encounter (Signed)
My chart message sent to the pt to get her set up for injection visit.

## 2018-12-27 DIAGNOSIS — K5904 Chronic idiopathic constipation: Secondary | ICD-10-CM | POA: Diagnosis not present

## 2018-12-27 DIAGNOSIS — K219 Gastro-esophageal reflux disease without esophagitis: Secondary | ICD-10-CM | POA: Diagnosis not present

## 2018-12-27 DIAGNOSIS — R140 Abdominal distension (gaseous): Secondary | ICD-10-CM | POA: Diagnosis not present

## 2018-12-27 LAB — HEPATIC FUNCTION PANEL
ALT: 21 (ref 7–35)
AST: 25 (ref 13–35)
Alkaline Phosphatase: 50 (ref 25–125)
Bilirubin, Direct: 0.06 (ref 0.01–0.4)
Bilirubin, Total: 0.2

## 2018-12-29 ENCOUNTER — Encounter: Payer: Self-pay | Admitting: Obstetrics & Gynecology

## 2018-12-29 ENCOUNTER — Ambulatory Visit (INDEPENDENT_AMBULATORY_CARE_PROVIDER_SITE_OTHER): Payer: BC Managed Care – PPO

## 2018-12-29 ENCOUNTER — Ambulatory Visit (INDEPENDENT_AMBULATORY_CARE_PROVIDER_SITE_OTHER): Payer: BC Managed Care – PPO | Admitting: Obstetrics & Gynecology

## 2018-12-29 ENCOUNTER — Other Ambulatory Visit: Payer: Self-pay

## 2018-12-29 VITALS — BP 120/70 | HR 66 | Temp 97.8°F | Resp 14 | Ht 68.0 in | Wt 149.0 lb

## 2018-12-29 DIAGNOSIS — N950 Postmenopausal bleeding: Secondary | ICD-10-CM

## 2018-12-29 NOTE — Progress Notes (Signed)
59 y.o. [id number] Legally Separated White or Caucasian female here for pelvic ultrasound due to PMP bleeding.  She is on HRT and has continued cycling with her HRT.  However, in March, April and May she had cycles that lasted 8 days which is longer than her bleeding has been previously.  Exam was normal on 12/06/2018.  PUS and possible biopsy recommended.  She has been starting bleeding on the 2nd of each month and she's not had any bleeding in July thus far.   Pap 09/01/2018 was neg with neg HR HPV.  [facility] 6/16 was 60.  Patient's last menstrual period was 09/14/2014.  Contraception: PMP  Findings:  UTERUS: 8.9 x 5.0 x 4.2cm without fibroids EMS: 3.6- 4.91m, symmetric ADNEXA: Left ovary: 1.8 x 0.8 x 0.9cm       Right ovary: 0.9 x 0.8 x 0.6cm CUL DE SAC:  No free fluid  Discussion:  Findings reviewed.  Endometrial thickness is appropriate and there are no other endometrial findings.  Given ultrasound today and no bleeding in July, will continue to monitor.  .  Assessment:  PMP bleeding that has not occurred this month. Normal PUS   Plan:  She will continue to monitor bleeding and call if has any cycles that are like the ones March-May.  We also discussed decreasing her HRT to 0.0251mdosage as she is using 0.07584matches and cutting them in half.  As she is afraid of having worsening hot flashes, I do not feel she needs to make any adjustments at this point.  Questions answered.  ~15 minutes spent with patient >50% of time was in face to face discussion of above.

## 2019-01-04 ENCOUNTER — Other Ambulatory Visit: Payer: Self-pay | Admitting: Family Medicine

## 2019-01-05 NOTE — Telephone Encounter (Signed)
Patient calling in checking on status of medication refill. Please advise.

## 2019-01-10 DIAGNOSIS — F3181 Bipolar II disorder: Secondary | ICD-10-CM | POA: Diagnosis not present

## 2019-01-17 ENCOUNTER — Ambulatory Visit (INDEPENDENT_AMBULATORY_CARE_PROVIDER_SITE_OTHER): Payer: BC Managed Care – PPO | Admitting: Psychology

## 2019-01-17 DIAGNOSIS — F312 Bipolar disorder, current episode manic severe with psychotic features: Secondary | ICD-10-CM | POA: Diagnosis not present

## 2019-02-01 ENCOUNTER — Encounter: Payer: Self-pay | Admitting: Obstetrics & Gynecology

## 2019-02-01 ENCOUNTER — Telehealth: Payer: Self-pay | Admitting: Obstetrics & Gynecology

## 2019-02-01 DIAGNOSIS — N950 Postmenopausal bleeding: Secondary | ICD-10-CM

## 2019-02-01 NOTE — Telephone Encounter (Signed)
Spoke with patient. Patient was seen 12/29/18 for postmenopausal bleeding, PUS normal. Reports no menses in July, bleeding started on 8/10. Flow was heavy with cramping not relieved by OTC tylenol. Flow has decreased, regular tampon placed this morning, has not needed to change, no cramps. Taking Prometrium 200 mg day 1-15 each month, no missed pills. Estradiol 0.082m patch, !/2 patch twice weekly. Denies N/V, fever/chills. Advised patient Dr. MSabra Heckis out of the office, will review with covering provider and return call, patient agreeable.   Dr. JTalbert Nan-please review and advise.

## 2019-02-01 NOTE — Telephone Encounter (Signed)
Patient sent the following correspondence through [place].  Hello Dr.Miller,   Sadly and very painful cramps. Period came back Monday . Bled through my clothes .   Today was worse, 3 tampons. A panty shield alone is out of the question. I took 3 Tylenol and work and it barely touched me. I am severely bloated like big time not normal. My blood is a concerning red color. You did an ultrasound last month as came back normal. Help!

## 2019-02-01 NOTE — Telephone Encounter (Signed)
I would recommend she have an endometrial biopsy.

## 2019-02-01 NOTE — Telephone Encounter (Signed)
Spoke with patient, advised as seen below per Dr. Talbert Nan. Patient request to schedule with Dr. Sabra Heck. Order placed for precert. EMB scheduled for 8/18 at 11:30am with Dr. Sabra Heck. Advised to take Motrin 800 mg with food and water one hour before procedure. Advised patient to call office if bleeding becomes heavy, changing saturated tampon/pad q1-2 hrs or any new symptoms develop.   Routing to provider for final review. Patient is agreeable to disposition. Will close encounter.  Cc: Dr. Sabra Heck, [place], [place]

## 2019-02-01 NOTE — Telephone Encounter (Signed)
Call placed to convey benefits for endometrial biopsy. Spoke with patient she understands/agreeable with the benefits. Appointment scheduled 02/07/19

## 2019-02-03 ENCOUNTER — Other Ambulatory Visit: Payer: Self-pay

## 2019-02-07 ENCOUNTER — Ambulatory Visit: Payer: BC Managed Care – PPO | Admitting: Obstetrics & Gynecology

## 2019-02-07 ENCOUNTER — Encounter: Payer: Self-pay | Admitting: Obstetrics & Gynecology

## 2019-02-07 ENCOUNTER — Other Ambulatory Visit: Payer: Self-pay

## 2019-02-07 DIAGNOSIS — N950 Postmenopausal bleeding: Secondary | ICD-10-CM | POA: Diagnosis not present

## 2019-02-07 DIAGNOSIS — N719 Inflammatory disease of uterus, unspecified: Secondary | ICD-10-CM | POA: Diagnosis not present

## 2019-02-07 NOTE — Progress Notes (Signed)
GYNECOLOGY  VISIT  CC:   Irregular vaginal bleeding/ spotting x 1 week  HPI: 58 y.o. G2P2002 Legally Separated White or Caucasian female here for endometrial bx.  Was seen 12/29/2018 for PMP bleeding and endometrium was 3.6-4.108m.  Pt let me know that her bleeding restarted and was very heavy on 02/01/2019.  Endometrial biopsy was recommended.  She is here for this today.  She had bleeding for almost a week with several days that were heavy.  She is now just having some light pink spotting.  We discussed tapering her HRT at this point due to bleeding as long as biopsy is negative for abnormal cells.    Pap 08/2018 was negative with neg HR HPV.  GYNECOLOGIC HISTORY: Patient's last menstrual period was 09/14/2014. Contraception: PMP Menopausal hormone therapy: vivelle dot patch, progesterone   Patient Active Problem List   Diagnosis Date Noted  . Psoriasis of scalp 02/18/2018  . AC joint arthropathy 11/18/2017  . OSA on CPAP 10/21/2016  . Postmenopausal HRT (hormone replacement therapy) 12/04/2015  . Psychosocial stressors 12/04/2015  . [place]ipolar disorder ([place] 12/20/2014  . Celiac disease 10/07/2014  . OLECRANON BURSITIS 04/02/2009  . ADHD 12/20/2008  . URTICARIA 10/26/2008  . Anxiety state 03/29/2007  . DEPRESSION 03/29/2007  . ASTHMA 03/29/2007  . GERD 03/29/2007    Past Medical History:  Diagnosis Date  . Abnormal Pap smear of cervix 2013   ASCUS  . Acne    sees Dr. DJarome Matinfor skin checks and acne  . Anemia   . Anxiety   . Bipolar 1 disorder ([place]   . B[place] depression ([place]    se[facility] Dr MGala [facility]dochat [facility] and sees LTrey Paulafor  therapy  . Constipation   . GERD (gastroesophageal reflux disease)   . Herpes simplex   . IBS (irritable bowel syndrome)   . Insomnia   . Migraines   . Pneumonia 2019    Past Surgical History:  Procedure Laterality Date  . BASAL CELL CARCINOMA EXCISION     Dr Amy JMartinique . BREAST ENHANCEMENT SURGERY    .  CESAREAN SECTION  1996  . COLONOSCOPY  2015   per Dr. MCollene Mares clear, repeat in 10 yrs   . COLPOSCOPY  10/07/11   CIN I  . ESOPHAGOGASTRODUODENOSCOPY  09/07/2006   Dr JArdis Hughs   MEDS:   Current Outpatient Medications on File Prior to Visit  Medication Sig Dispense Refill  . ALPRAZolam (XANAX) 0.25 MG tablet     . Biotin (BIOTIN 5000) 5 MG CAPS Take 1,000 mg by mouth daily. Reported on 12/03/2015    . Calcium Carb-Cholecalciferol (CALCIUM 1000 + D PO) Take by mouth daily.    . cholecalciferol (VITAMIN D3) 25 MCG (1000 UT) tablet Take 1,000 Units by mouth daily.    . clobetasol (TEMOVATE) 0.05 % external solution APPLY TO AFFECTED AREA TWICE A DAY 50 mL 2  . estradiol (VIVELLE-DOT) 0.075 MG/24HR Place 1 patch onto the skin 2 (two) times a week. 24 patch 4  . ibuprofen (ADVIL,MOTRIN) 800 MG tablet TAKE 1 TABLET (800 MG TOTAL) BY MOUTH EVERY 8 (EIGHT) HOURS AS NEEDED FOR MODERATE PAIN. 60 tablet 5  . Ibuprofen-Famotidine (DUEXIS) 800-26.6 MG TABS 1 po q d to bid prn 90 tablet 0  . lamoTRIgine (LAMICTAL) 100 MG tablet Take 100 mg by mouth daily.     .Marland Kitchenlidocaine (LIDODERM) 5 % Place 1 patch onto the skin daily. Remove & Discard patch within  12 hours or as directed by MD 30 patch 0  . Multiple Vitamin (MULTI-VITAMIN PO) Take by mouth daily.    Marland Kitchen omeprazole (PRILOSEC) 40 MG capsule     . progesterone (PROMETRIUM) 200 MG capsule TAKE 1 CAPSULE BY MOUTH EVERYDAY AT BEDTIME 90 capsule 4  . sertraline (ZOLOFT) 100 MG tablet     . traMADol (ULTRAM) 50 MG tablet TAKE 1/2 TABLET BY MOUTH EVERY 8 HOURS AS NEEDED 60 tablet 5  . traZODone (DESYREL) 50 MG tablet TAKE 1-2 TABLET AT BEDTIME AS NEEDED FOR SLEEP  2  . valACYclovir (VALTREX) 500 MG tablet TAKE 1 TABLET BY MOUTH EVERY DAY 90 tablet 1   No current facility-administered medications on file prior to visit.     ALLERGIES: Ciprofloxacin and Sulfa antibiotics  Family History  Problem Relation Age of Onset  . Cancer Mother   . Alcohol abuse Other    . Arthritis Other   . Cancer Other        breast, ovrarian, uterine    SH:  Separated, non smoker  Review of Systems  Genitourinary: Positive for menstrual problem, pelvic pain and vaginal bleeding.  All other systems reviewed and are negative.   PHYSICAL EXAMINATION:    BP 128/86   Pulse 76   Temp 98 F (36.7 C) (Temporal)   Ht 5' 8"  (1.727 m)   Wt 151 lb 12.8 oz (68.9 kg)   LMP 09/14/2014   BMI 23.08 kg/m     General appearance: alert, cooperative and appears stated age Lymph:  no inguinal LAD noted  Pelvic: External genitalia:  no lesions              Urethra:  normal appearing urethra with no masses, tenderness or lesions              Bartholins and Skenes: normal                 Vagina: normal appearing vagina with normal color and discharge, no lesions              Cervix: no lesions              Bimanual Exam:  Uterus:  normal size, contour, position, consistency, mobility, non-tender              Adnexa: no mass, fullness, tenderness  Endometrial biopsy recommended.  Discussed with patient.  Verbal and written consent obtained.   Procedure:  Speculum placed.  Cervix visualized and cleansed with betadine prep.  A single toothed tenaculum was applied to the anterior lip of the cervix.  Endometrial pipelle was advanced through the cervix into the endometrial cavity without difficulty.  Pipelle passed to 10cm.  Suction applied and pipelle removed with good tissue sample obtained.  Tenculum removed.  No bleeding noted.  Patient tolerated procedure well.  Chaperone was present for exam.  Assessment: PMP bleeding on HRT (1/2 patch of 0.051m patches)  Plan: Endometrial biopsy pending.  Results and recommendations will be called to patient.

## 2019-02-24 ENCOUNTER — Ambulatory Visit: Payer: Self-pay | Admitting: Neurology

## 2019-03-02 ENCOUNTER — Ambulatory Visit (INDEPENDENT_AMBULATORY_CARE_PROVIDER_SITE_OTHER): Payer: BC Managed Care – PPO | Admitting: Psychology

## 2019-03-02 DIAGNOSIS — F331 Major depressive disorder, recurrent, moderate: Secondary | ICD-10-CM | POA: Diagnosis not present

## 2019-03-16 ENCOUNTER — Other Ambulatory Visit: Payer: Self-pay | Admitting: Obstetrics & Gynecology

## 2019-03-16 NOTE — Telephone Encounter (Signed)
Medication refill request: Valtrex  Last AEX:  09/01/18 Next AEX: 05/11/19 Last MMG (if hormonal medication request): 03/18/18  Bi-rads 1 neg  Refill authorized: #[id number] with 0 RF

## 2019-04-03 ENCOUNTER — Other Ambulatory Visit: Payer: Self-pay

## 2019-04-03 DIAGNOSIS — Z20828 Contact with and (suspected) exposure to other viral communicable diseases: Secondary | ICD-10-CM | POA: Diagnosis not present

## 2019-04-03 DIAGNOSIS — Z20822 Contact with and (suspected) exposure to covid-19: Secondary | ICD-10-CM

## 2019-04-04 LAB — NOVEL CORONAVIRUS, NAA: SARS-CoV-2, NAA: NOT DETECTED

## 2019-04-12 DIAGNOSIS — F3181 Bipolar II disorder: Secondary | ICD-10-CM | POA: Diagnosis not present

## 2019-04-12 DIAGNOSIS — F3174 Bipolar disorder, in full remission, most recent episode manic: Secondary | ICD-10-CM | POA: Diagnosis not present

## 2019-04-13 ENCOUNTER — Encounter: Payer: Self-pay | Admitting: Family Medicine

## 2019-04-13 ENCOUNTER — Other Ambulatory Visit: Payer: Self-pay

## 2019-04-13 ENCOUNTER — Ambulatory Visit (INDEPENDENT_AMBULATORY_CARE_PROVIDER_SITE_OTHER): Payer: BC Managed Care – PPO | Admitting: Family Medicine

## 2019-04-13 ENCOUNTER — Ambulatory Visit: Payer: Self-pay

## 2019-04-13 ENCOUNTER — Ambulatory Visit: Payer: BC Managed Care – PPO | Admitting: Surgical

## 2019-04-13 DIAGNOSIS — M19011 Primary osteoarthritis, right shoulder: Secondary | ICD-10-CM

## 2019-04-13 MED ORDER — TRAMADOL HCL 50 MG PO TABS: 50.0000 mg | ORAL_TABLET | Freq: Four times a day (QID) | ORAL | 0 refills | Status: DC | PRN

## 2019-04-13 NOTE — Progress Notes (Signed)
   Office Visit Note   Patient: Jill Mcintyre [facility] [name]           Date of Birth: 28-Oct-1960           MRN: [id number] Visit Date: 04/13/2019 Requested by: Laurey Morale, MD [place],   [id number] PCP: Laurey Morale, MD  Subjective: Chief Complaint  Patient presents with  . Right Shoulder - Pain    Chronic pain. Last cortisone injection was in April - wore off 2 weeks ago - worsening pain.    HPI: He is here with recurrent right shoulder pain.  She has AC joint arthropathy.  Her last injection helped tremendously, she was pain-free until about 2 weeks ago.  Now she hurts on top of the shoulder with reaching overhead or behind her back.  She would like to postpone surgery a little while longer if possible.              ROS: No fevers or chills.  All other systems were reviewed and are negative.  Objective: Vital Signs: LMP 09/14/2014   Physical Exam:  General:  Alert and oriented, in no acute distress. Pulm:  Breathing unlabored. Psy:  Normal mood, congruent affect. Skin: No rash. Right shoulder: Still has full range of motion with pain at the extremes.  She is exquisitely tender to palpation over the [facility] joint.  Imaging: None other than for injection  Assessment & Plan: 1.  Chronic right shoulder pain due to [facility] joint arthropathy -We will inject again under ultrasound guidance with cortisone.  Could contemplate prolotherapy with dextrose if this does not give long-term relief and she still wants to avoid surgery.  Ultimately she will likely need surgical intervention.     Procedures:  Right AC injection:   After sterile prep with Betadine, injected 3 cc 1% lidocaine without epinephrine and 40 mg methylprednisolone.

## 2019-04-25 ENCOUNTER — Encounter: Payer: Self-pay | Admitting: Family Medicine

## 2019-04-26 ENCOUNTER — Ambulatory Visit: Payer: Self-pay | Admitting: *Deleted

## 2019-04-26 NOTE — Telephone Encounter (Signed)
Pt has a hx of shingles and thinks she is having an episode now with burning and tingling going up her left arm. She stated two fingers on that hand are swollen, lthe pointer finger and the middle finger.  She takes valtrex every day,.   Receive the vaccine in July and September for shingles.  She denies having a rash or itching.  She is requesting an appointment to see if she needs a steroid. Unable to contact the practice. Will route to [facility] at [facility] for an appointment for tomorrow. She is requesting a call back.   rReason for Disposition . Nursing judgment or information in reference  Answer Assessment - Initial Assessment Questions 1. REASON FOR CALL: "What is your main concern right now?"     Pt thinks she has a shingles outbreak 2. ONSET: "When did the burning and tingling start?"     Today . SEVERITY: "How bad is the burning and tingling?"     Up her arm 4. FEVER: "Do you have a fever?"     no 5. OTHER SYMPTOMS: "Do you have any other new symptoms?"     Swollen fingers on the left hand 6. INTERVENTIONS AND RESPONSE: "What have you done so far to try to make this better? What medications have you used?"     Taken advil 7. PREGNANCY: "Is there any chance you are pregnant?"     n/a  Protocols used: NO GUIDELINE AVAILABLE-A-AH

## 2019-04-26 NOTE — Telephone Encounter (Signed)
Left message to return phone call.

## 2019-04-26 NOTE — Telephone Encounter (Signed)
Yes please work her in for an in person OV tomorrow at either 8:15 or 10:15 am

## 2019-04-26 NOTE — Telephone Encounter (Signed)
Please advise 

## 2019-04-27 ENCOUNTER — Ambulatory Visit: Payer: BC Managed Care – PPO | Admitting: Family Medicine

## 2019-04-27 ENCOUNTER — Other Ambulatory Visit: Payer: Self-pay

## 2019-04-27 ENCOUNTER — Encounter: Payer: Self-pay | Admitting: Family Medicine

## 2019-04-27 VITALS — BP 102/58 | HR 66 | Temp 98.5°F | Ht 68.0 in | Wt 148.8 lb

## 2019-04-27 DIAGNOSIS — B029 Zoster without complications: Secondary | ICD-10-CM

## 2019-04-27 MED ORDER — VALACYCLOVIR HCL 1 G PO TABS: 1000.0000 mg | ORAL_TABLET | Freq: Three times a day (TID) | ORAL | 0 refills | Status: AC

## 2019-04-27 MED ORDER — VALACYCLOVIR HCL 500 MG PO TABS: 500.0000 mg | ORAL_TABLET | Freq: Two times a day (BID) | ORAL | 3 refills | Status: DC

## 2019-04-27 MED ORDER — METHYLPREDNISOLONE ACETATE 40 MG/ML IJ SUSP
40.0000 mg | Freq: Once | INTRAMUSCULAR | Status: AC
  Administered 2019-04-27: 10:00:00 40 mg via INTRAMUSCULAR

## 2019-04-27 MED ORDER — METHYLPREDNISOLONE ACETATE 80 MG/ML IJ SUSP
80.0000 mg | Freq: Once | INTRAMUSCULAR | Status: AC
  Administered 2019-04-27: 80 mg via INTRAMUSCULAR

## 2019-04-27 NOTE — Telephone Encounter (Signed)
Pt has been scheduled.  °

## 2019-04-27 NOTE — Patient Instructions (Signed)
Health Maintenance Due  Topic Date Due  . COLONOSCOPY  11/20/2012  . INFLUENZA VACCINE  01/21/2019    No flowsheet data found.

## 2019-04-27 NOTE — Progress Notes (Signed)
   Subjective:    Patient ID: Jill Mcintyre, female    DOB: [birth year], 58 y.o.   MRN: [id number]  HPI Here for a possible shingles episode. She takes Valtrex 500 mg once daily for prevention, but she has had shingles at least 3 times. Yesterday she developed a burning pain dow the left arm and into the left hand, and most of this pain was centered in the left 2nd and 3rd fingers. These fingers were also red and swollen yesterday. Since then the burning in the arm and hand has resolved, but she still has burning in these two fingers. The swelling and redness has stopped. No visible rash. She says this feels exactly like the shingles had felt in the past.   Review of Systems  Constitutional: Negative.   Respiratory: Negative.   Cardiovascular: Negative.   Skin: Positive for color change. Negative for rash.       Objective:   Physical Exam Constitutional:      Appearance: Normal appearance.  Cardiovascular:     Rate and Rhythm: Normal rate and regular rhythm.     Pulses: Normal pulses.     Heart sounds: Normal heart sounds.  Pulmonary:     Effort: Pulmonary effort is normal.     Breath sounds: Normal breath sounds.  Musculoskeletal:     Comments: The left hand appears normal but she has increased sensitivity to touch in the 2nd and 3rd fingers   Skin:    Findings: No erythema or rash.  Neurological:     Mental Status: She is alert.           Assessment & Plan:  This is likely a mild shingles flare, so we will treat with Valtrex 1000 mg TID for 10 days and a shot of DepoMedrol. After the 10 days she will return to Valtrex prophylaxis, but she will increase this to 500 mg BID. She has already had the Shingrix vaccine. Alysia Penna, MD

## 2019-04-27 NOTE — Telephone Encounter (Signed)
Called pt again no answer. Left message for pt to return call.

## 2019-04-27 NOTE — Addendum Note (Signed)
Addended by: Gwenyth Ober R on: 04/27/2019 10:22 AM   Modules accepted: Orders

## 2019-05-04 ENCOUNTER — Ambulatory Visit (INDEPENDENT_AMBULATORY_CARE_PROVIDER_SITE_OTHER): Payer: BC Managed Care – PPO | Admitting: Psychology

## 2019-05-04 DIAGNOSIS — D2272 Melanocytic nevi of left lower limb, including hip: Secondary | ICD-10-CM | POA: Diagnosis not present

## 2019-05-04 DIAGNOSIS — L570 Actinic keratosis: Secondary | ICD-10-CM | POA: Diagnosis not present

## 2019-05-04 DIAGNOSIS — C44519 Basal cell carcinoma of skin of other part of trunk: Secondary | ICD-10-CM | POA: Diagnosis not present

## 2019-05-04 DIAGNOSIS — F312 Bipolar disorder, current episode manic severe with psychotic features: Secondary | ICD-10-CM

## 2019-05-04 DIAGNOSIS — Z85828 Personal history of other malignant neoplasm of skin: Secondary | ICD-10-CM | POA: Diagnosis not present

## 2019-05-04 DIAGNOSIS — L821 Other seborrheic keratosis: Secondary | ICD-10-CM | POA: Diagnosis not present

## 2019-05-04 DIAGNOSIS — L820 Inflamed seborrheic keratosis: Secondary | ICD-10-CM | POA: Diagnosis not present

## 2019-05-04 DIAGNOSIS — L812 Freckles: Secondary | ICD-10-CM | POA: Diagnosis not present

## 2019-05-11 ENCOUNTER — Ambulatory Visit: Payer: BC Managed Care – PPO | Admitting: Obstetrics & Gynecology

## 2019-05-16 ENCOUNTER — Telehealth: Payer: Self-pay | Admitting: Family Medicine

## 2019-05-16 NOTE — Telephone Encounter (Signed)
Medication Refill - Medication: something for uti  Has the patient contacted their pharmacy? No. (Agent: If no, request that the patient contact the pharmacy for the refill.) (Agent: If yes, when and what did the pharmacy advise?)  Preferred Pharmacy (with phone number [facility] street [facility] [place]  Pt just got out of town and feels like she has a uti  Also she wants to know what she can take otc for the burning    Agent: Please be advised that RX refills may take up to 3 business days. We ask that you follow-up with your pharmacy.

## 2019-05-16 NOTE — Telephone Encounter (Signed)
Does pt needs an appt.?

## 2019-05-17 ENCOUNTER — Telehealth: Payer: Self-pay | Admitting: Obstetrics & Gynecology

## 2019-05-17 MED ORDER — PHENAZOPYRIDINE HCL 200 MG PO TABS: 200.0000 mg | ORAL_TABLET | Freq: Three times a day (TID) | ORAL | 0 refills | Status: DC | PRN

## 2019-05-17 MED ORDER — NITROFURANTOIN MONOHYD MACRO 100 MG PO CAPS: 100.0000 mg | ORAL_CAPSULE | Freq: Two times a day (BID) | ORAL | 0 refills | Status: AC

## 2019-05-17 MED ORDER — NITROFURANTOIN MONOHYD MACRO 100 MG PO CAPS: 100.0000 mg | ORAL_CAPSULE | Freq: Two times a day (BID) | ORAL | 0 refills | Status: DC

## 2019-05-17 NOTE — Telephone Encounter (Signed)
Spoke back with pt. Pt given update on UTI Rx. [facility] [place] [facility]. Rx x 2 sent per Dr Elza Rafter orders.   Pt verbalized understanding of taking medication and to go to urgent care if needs follow up in 2 days and not improved sx. Pt agreeable. Pt advised to call back to office on 05/22/19 if sx still not improved and if goes to urgent care while in [facility]. Pt agrees with plan.   Will route to Dr Quincy Simmonds for final review. Will close encounter.   CC: Dr Sabra Heck.

## 2019-05-17 NOTE — Telephone Encounter (Signed)
Call in Friedens 100 mg bid for 7 days. She can use AZO for the burning.

## 2019-05-17 NOTE — Telephone Encounter (Signed)
[place] for treatment for UTI.  Macrobid 100 mg po bid x 5 days.  Disp:  10 RF:  None.  She can take Pyridium 200 mg, one po tid x 2 - 3 days prn dysuria, to treat pain. Disp:  10 RF:  None.  (This will stain her urine red orange. )  She needs to go to urgent care if she is not improve in 48 hours.

## 2019-05-17 NOTE — Telephone Encounter (Signed)
Patient is calling regarding UTI. Patient noticed UTI symptoms yesterday morning, but had to rush to catch a flight to take care of her mother in [place] suddenly. Patient also stated that this morning when she got out of the shower she noticed a lesion on her right inner labia. Patient stated that it hurts to sit down. Patient stated that it is white, raised, and about 2 inches long.

## 2019-05-17 NOTE — Telephone Encounter (Signed)
Spoke with pt. Pt states having pain, burning and urgency that started 11/24 am. Pt states taking AZO. With last intercourse on Sunday, pt noticed having" white, raised, 2 in long lesion" on right inner labia. Have been taking 1000 mg daily of valtrex. Pt thinks not being HSV outbreak. Pt states in [place] til Sunday of taking care of mother.

## 2019-06-02 DIAGNOSIS — R05 Cough: Secondary | ICD-10-CM | POA: Diagnosis not present

## 2019-06-02 DIAGNOSIS — R509 Fever, unspecified: Secondary | ICD-10-CM | POA: Diagnosis not present

## 2019-06-02 DIAGNOSIS — U071 COVID-19: Secondary | ICD-10-CM | POA: Diagnosis not present

## 2019-06-02 DIAGNOSIS — R0981 Nasal congestion: Secondary | ICD-10-CM | POA: Diagnosis not present

## 2019-06-02 DIAGNOSIS — R52 Pain, unspecified: Secondary | ICD-10-CM | POA: Diagnosis not present

## 2019-06-03 ENCOUNTER — Telehealth: Payer: Self-pay | Admitting: Family Medicine

## 2019-06-03 NOTE — Telephone Encounter (Signed)
Call from on- call service that pt was dx with covid 19 and requests a zpack and hydroxychlorquine.  She received a positive result yesterday I called her back- she was feeling pretty bad a few days ago but doing better now  She is now feeling better, cough and chest congestion are better Temp to about 100  We talked- I explained that azithromycin and hydroxychlorquine do not have good evidence for efficacy in the outpt setting and are unlikely to help.  She understands and is ok with continuing symptomatic treatment, will call me back if worsening

## 2019-06-05 ENCOUNTER — Telehealth (INDEPENDENT_AMBULATORY_CARE_PROVIDER_SITE_OTHER): Payer: BC Managed Care – PPO | Admitting: Family Medicine

## 2019-06-05 ENCOUNTER — Encounter: Payer: Self-pay | Admitting: Family Medicine

## 2019-06-05 ENCOUNTER — Other Ambulatory Visit: Payer: Self-pay

## 2019-06-05 DIAGNOSIS — J40 Bronchitis, not specified as acute or chronic: Secondary | ICD-10-CM | POA: Diagnosis not present

## 2019-06-05 DIAGNOSIS — U071 COVID-19: Secondary | ICD-10-CM

## 2019-06-05 MED ORDER — METHYLPREDNISOLONE 4 MG PO TBPK: ORAL_TABLET | ORAL | 0 refills | Status: DC

## 2019-06-05 MED ORDER — AZITHROMYCIN 250 MG PO TABS: ORAL_TABLET | ORAL | 0 refills | Status: DC

## 2019-06-05 NOTE — Telephone Encounter (Signed)
Pt calling back and seems to be feeling worse. Pt is requesting that medication hydrocychlorquine be called in. Please advise

## 2019-06-05 NOTE — Progress Notes (Signed)
Virtual Visit via Video Note  I connected with the patient on 06/05/19 at  4:15 PM EST by a video enabled telemedicine application and verified that I am speaking with the correct person using two identifiers.  Location patient: home Location provider:work or home office Persons participating in the virtual visit: patient, provider  I discussed the limitations of evaluation and management by telemedicine and the availability of in person appointments. The patient expressed understanding and agreed to proceed.   HPI: Here to follow up an urgent care visit on 06-02-19. Last week she developed fever, headache, body aches, ST, and a dry cough. No NVD. She went to an urgent care where she tested positive for the Covid virus and negative for influenza A and B. She was told to drink fluids and take OTC medications as needed. Since then the fever and headache has resolved, but the cough is now productive of green sputum and she is mildly SOB. No chest pain. She has an albuterol inhaler to use if needed, but she has not used it yet.    ROS: See pertinent positives and negatives per HPI.  Past Medical History:  Diagnosis Date  . Abnormal Pap smear of cervix 2013   ASCUS  . Acne    sees Dr. Jarome Matin for skin checks and acne  . Anemia   . Anxiety   . Bipolar 1 disorder ([place])   . Bipolar depression ([place])    sees Dr Gala Murdoch [facility] and sees Trey Paula for  therapy  . Constipation   . GERD (gastroesophageal reflux disease)   . Herpes simplex   . IBS (irritable bowel syndrome)   . Insomnia   . Migraines   . Pneumonia 2019    Past Surgical History:  Procedure Laterality Date  . BASAL CELL CARCINOMA EXCISION     Dr Amy Martinique  . BREAST ENHANCEMENT SURGERY    . CESAREAN SECTION  1996  . COLONOSCOPY  2015   per Dr. Collene Mares, clear, repeat in 10 yrs   . COLPOSCOPY  10/07/11   CIN I  . ESOPHAGOGASTRODUODENOSCOPY  09/07/2006   Dr Ardis Hughs    Family History  Problem  Relation Age of Onset  . Cancer Mother   . Alcohol abuse Other   . Arthritis Other   . Cancer Other        breast, ovrarian, uterine     Current Outpatient Medications:  .  ALPRAZolam (XANAX) 0.25 MG tablet, , Disp: , Rfl:  .  azithromycin (ZITHROMAX Z-PAK) 250 MG tablet, As directed, Disp: 6 each, Rfl: 0 .  Biotin (BIOTIN 5000) 5 MG CAPS, Take 1,000 mg by mouth daily. Reported on 12/03/2015, Disp: , Rfl:  .  Calcium Carb-Cholecalciferol (CALCIUM 1000 + D PO), Take by mouth daily., Disp: , Rfl:  .  cholecalciferol (VITAMIN D3) 25 MCG (1000 UT) tablet, Take 1,000 Units by mouth daily., Disp: , Rfl:  .  clobetasol (TEMOVATE) 0.05 % external solution, APPLY TO AFFECTED AREA TWICE A DAY, Disp: 50 mL, Rfl: 2 .  estradiol (VIVELLE-DOT) 0.075 MG/24HR, Place 1 patch onto the skin 2 (two) times a week., Disp: 24 patch, Rfl: 4 .  ibuprofen (ADVIL,MOTRIN) 800 MG tablet, TAKE 1 TABLET (800 MG TOTAL) BY MOUTH EVERY 8 (EIGHT) HOURS AS NEEDED FOR MODERATE PAIN., Disp: 60 tablet, Rfl: 5 .  Ibuprofen-Famotidine (DUEXIS) 800-26.6 MG TABS, 1 po q d to bid prn, Disp: 90 tablet, Rfl: 0 .  lamoTRIgine (LAMICTAL) 100 MG  tablet, Take 100 mg by mouth daily. , Disp: , Rfl:  .  lidocaine (LIDODERM) 5 %, Place 1 patch onto the skin daily. Remove & Discard patch within 12 hours or as directed by MD, Disp: 30 patch, Rfl: 0 .  methylPREDNISolone (MEDROL DOSEPAK) 4 MG TBPK tablet, As directed, Disp: 21 tablet, Rfl: 0 .  Multiple Vitamin (MULTI-VITAMIN PO), Take by mouth daily., Disp: , Rfl:  .  omeprazole (PRILOSEC) 40 MG capsule, , Disp: , Rfl:  .  phenazopyridine (PYRIDIUM) 200 MG tablet, Take 1 tablet (200 mg total) by mouth 3 (three) times daily as needed for pain., Disp: 10 tablet, Rfl: 0 .  progesterone (PROMETRIUM) 200 MG capsule, TAKE 1 CAPSULE BY MOUTH EVERYDAY AT BEDTIME, Disp: 90 capsule, Rfl: 4 .  sertraline (ZOLOFT) 100 MG tablet, , Disp: , Rfl:  .  traMADol (ULTRAM) 50 MG tablet, Take 1 tablet (50 mg total)  by mouth every 6 (six) hours as needed. TAKE 1/2 TABLET BY MOUTH EVERY 8 HOURS AS NEEDED, Disp: 60 tablet, Rfl: 0 .  traZODone (DESYREL) 50 MG tablet, TAKE 1-2 TABLET AT BEDTIME AS NEEDED FOR SLEEP, Disp: , Rfl: 2 .  valACYclovir (VALTREX) 500 MG tablet, Take 1 tablet (500 mg total) by mouth 2 (two) times daily., Disp: 180 tablet, Rfl: 3  EXAM:  VITALS per patient if applicable:  GENERAL: alert, oriented, appears well and in no acute distress  HEENT: atraumatic, conjunttiva clear, no obvious abnormalities on inspection of external nose and ears  NECK: normal movements of the head and neck  LUNGS: on inspection no signs of respiratory distress, breathing rate appears normal, no obvious gross SOB, gasping or wheezing  CV: no obvious cyanosis  MS: moves all visible extremities without noticeable abnormality  PSYCH/NEURO: pleasant and cooperative, no obvious depression or anxiety, speech and thought processing grossly intact  ASSESSMENT AND PLAN: Covid infection, now with a bronchitis. We will treat with a Zpack and a Medrol dose pack. Recheck as needed. She is quarantining herself at home.  Alysia Penna, MD  Discussed the following assessment and plan:  No diagnosis found.     I discussed the assessment and treatment plan with the patient. The patient was provided an opportunity to ask questions and all were answered. The patient agreed with the plan and demonstrated an understanding of the instructions.   The patient was advised to call back or seek an in-person evaluation if the symptoms worsen or if the condition fails to improve as anticipated.

## 2019-06-05 NOTE — Telephone Encounter (Signed)
Virtual appointment scheduled with PCP

## 2019-06-06 ENCOUNTER — Ambulatory Visit (INDEPENDENT_AMBULATORY_CARE_PROVIDER_SITE_OTHER): Payer: BC Managed Care – PPO | Admitting: Psychology

## 2019-06-06 DIAGNOSIS — F312 Bipolar disorder, current episode manic severe with psychotic features: Secondary | ICD-10-CM

## 2019-06-08 ENCOUNTER — Telehealth: Payer: Self-pay | Admitting: Family Medicine

## 2019-06-08 DIAGNOSIS — F3181 Bipolar II disorder: Secondary | ICD-10-CM | POA: Diagnosis not present

## 2019-06-08 NOTE — Telephone Encounter (Signed)
Message Routed to PCP CMA 

## 2019-06-08 NOTE — Telephone Encounter (Signed)
Since her PCP just started her on steroids and antibiotics this week. I will defer to him when he comes back tomorrow. In the meantime she can use Mucinex

## 2019-06-08 NOTE — Telephone Encounter (Signed)
[name] please advise

## 2019-06-08 NOTE — Telephone Encounter (Signed)
Pt is requesting rx for cough syrup with codeine or something similar to help with cough and congestion. Please advise.  [id number]  [phone] Fax:  [phone]

## 2019-06-08 NOTE — Telephone Encounter (Signed)
Spoke with patient. Informed her that PCP is out of the office. Will route to see if another provider is comfortable filling. Jill Mcintyre are okay prescribing something ?

## 2019-06-09 MED ORDER — HYDROCODONE-HOMATROPINE 5-1.5 MG/5ML PO SYRP: 5.0000 mL | ORAL_SOLUTION | ORAL | 0 refills | Status: DC | PRN

## 2019-06-09 NOTE — Telephone Encounter (Signed)
I sent this in

## 2019-06-12 ENCOUNTER — Other Ambulatory Visit: Payer: Self-pay | Admitting: Family Medicine

## 2019-06-12 NOTE — Telephone Encounter (Signed)
Please advise. Rx is not on the current med list 

## 2019-06-26 ENCOUNTER — Telehealth: Payer: Self-pay

## 2019-06-26 NOTE — Telephone Encounter (Signed)
I called and left this information on the pharmacy's voice mail.

## 2019-06-26 NOTE — Telephone Encounter (Signed)
[facility] would like clarification on directions for Tramadol Rx that was written on 04/13/2019.  CB# [phone].  Please advise.  Thank you.

## 2019-06-26 NOTE — Telephone Encounter (Signed)
1 q 6 hours as needed

## 2019-06-26 NOTE — Telephone Encounter (Signed)
There are 2 different directions on that Tramadol Rx: 1 q 6 hrs prn and 1/2 q 8 hrs prn. Which is the correct sig?

## 2019-07-19 ENCOUNTER — Ambulatory Visit: Payer: BC Managed Care – PPO | Admitting: Psychology

## 2019-08-18 ENCOUNTER — Telehealth (INDEPENDENT_AMBULATORY_CARE_PROVIDER_SITE_OTHER): Payer: BC Managed Care – PPO | Admitting: Family Medicine

## 2019-08-18 ENCOUNTER — Other Ambulatory Visit: Payer: Self-pay

## 2019-08-18 DIAGNOSIS — Z9989 Dependence on other enabling machines and devices: Secondary | ICD-10-CM | POA: Diagnosis not present

## 2019-08-18 DIAGNOSIS — G4733 Obstructive sleep apnea (adult) (pediatric): Secondary | ICD-10-CM

## 2019-08-18 NOTE — Progress Notes (Signed)
Virtual Visit via Telephone Note  I connected with the patient on 08/18/19 at 11:00 AM EST by telephone and verified that I am speaking with the correct person using two identifiers.   I discussed the limitations, risks, security and privacy concerns of performing an evaluation and management service by telephone and the availability of in person appointments. I also discussed with the patient that there may be a patient responsible charge related to this service. The patient expressed understanding and agreed to proceed.  Location patient: home Location provider: work or home office Participants present for the call: patient, provider Patient did not have a visit in the prior 7 days to address this/these issue(s).   History of Present Illness: Here to ask about taking Sunosi to help her stay awake during the daytime. She saw Dr. Rondel Jumbo in 2017 and had a sleep study that confirmed she has sleep apnea. She tried CPAP with a full face mask and a nasal pillow, but she stopped this because she ended up pulling the apparatus off during the night. She last saw Dr. Redmond Baseman in May of 2018. Since then she has struggled to stay alert during the day. She has tried drinking coffee and energy drinks with poor success. She has seen advertisements for [facility] and asks my opinion.    Observations/Objective: Patient sounds cheerful and well on the phone. I do not appreciate any SOB. Speech and thought processing are grossly intact. Patient reported vitals:  Assessment and Plan: I reminded her that her daytime sleepiness is caused by her significant sleep apnea, and taking something like Sunosi would simply cover up the problem instead of addressing the problem. Patients with sleep apnea have a lot of stress placed on their bodies during the night when they have significant periods of hypoxia. I suggested she follow up with Dr. Redmond Baseman soon to discuss alternative ways to treat the sleep apnea, such as Inspire.  She agreed to do so.  Alysia Penna, MD  Follow Up Instructions:     [id number] 5-10 [phone] 11-20 [id number] 21-30 I did not refer this patient for an OV in the next 24 hours for this/these issue(s).  I discussed the assessment and treatment plan with the patient. The patient was provided an opportunity to ask questions and all were answered. The patient agreed with the plan and demonstrated an understanding of the instructions.   The patient was advised to call back or seek an in-person evaluation if the symptoms worsen or if the condition fails to improve as anticipated.  I provided 12 minutes of non-face-to-face time during this encounter.   Alysia Penna, MD

## 2019-08-22 ENCOUNTER — Telehealth: Payer: Self-pay | Admitting: Obstetrics & Gynecology

## 2019-08-22 NOTE — Telephone Encounter (Signed)
Patient would like labs to check hormone levels.

## 2019-08-22 NOTE — Telephone Encounter (Signed)
Spoke to pt. Pt wanting to discuss HRT treatment. Pt scheduled for mychart video consult on 08/28/2019 at 4 pm. Pt agreeable and verbalized understanding. Last AEX was 02/07/2019. Pt still taking Vivelle Dot and Prometrium but states is having more fatigue.  Routing to Dr Sabra Heck for review and will close encounter.

## 2019-08-24 ENCOUNTER — Ambulatory Visit (INDEPENDENT_AMBULATORY_CARE_PROVIDER_SITE_OTHER): Payer: BC Managed Care – PPO | Admitting: Psychology

## 2019-08-24 DIAGNOSIS — F312 Bipolar disorder, current episode manic severe with psychotic features: Secondary | ICD-10-CM

## 2019-08-25 ENCOUNTER — Other Ambulatory Visit: Payer: Self-pay

## 2019-08-28 ENCOUNTER — Other Ambulatory Visit: Payer: Self-pay

## 2019-08-28 ENCOUNTER — Encounter: Payer: Self-pay | Admitting: Obstetrics & Gynecology

## 2019-08-28 ENCOUNTER — Telehealth (INDEPENDENT_AMBULATORY_CARE_PROVIDER_SITE_OTHER): Payer: BC Managed Care – PPO | Admitting: Obstetrics & Gynecology

## 2019-08-28 DIAGNOSIS — G4733 Obstructive sleep apnea (adult) (pediatric): Secondary | ICD-10-CM | POA: Diagnosis not present

## 2019-08-28 DIAGNOSIS — Z9989 Dependence on other enabling machines and devices: Secondary | ICD-10-CM | POA: Diagnosis not present

## 2019-08-28 DIAGNOSIS — R5383 Other fatigue: Secondary | ICD-10-CM | POA: Diagnosis not present

## 2019-08-28 NOTE — Progress Notes (Signed)
Virtual Visit via Video Note  I connected with Jill Mcintyre on 08/28/19 at  4:00 PM EST by a video enabled telemedicine application and verified that I am speaking with the correct person using two identifiers.  Location: Patient: work Cytogeneticist: work office   I discussed the limitations of evaluation and management by telemedicine and the availability of in person appointments. The patient expressed understanding and agreed to proceed.  History of Present Illness: 59 yo G2P2 legally separated WF here for discussion of possible hormonal symptoms.  She has been having some issues with falling asleep at work.  Mother has been in communication with hospice.  Linna Hoff had another MI.  She did have to put her dog down so the last few months have been hard.  Had therapist virtual meeting last week.  This really helped.  She is seen about monthly right now.    Reports sleep is bad.  Hot flashes are rare.  Has not had any bleeding since August 2020.    She had a telehealth visit with [name] last week.  She has been referred back to neurology for repeat sleep apnea.  She did have Covid in December that was really hard on her.  She is much better.  Denies SOB or palpitations.  No chest pain with exercising.  Just having a lot more fatigue than she thinks she should.   Observations/Objective: WNWD WF, NAD  Assessment and Plan: Fatigue  Covid in December OSA H/o depression with personal stressors the last several months  Follow Up Instructions: She is coming for lab work tomorrow.  Orders entered. Referral to Dr. Brett Fairy at [facility] placed today She is aware some of this could be related to Covid but it has lasted long enough that she wants to proceed with evaluation at this point.  We also discussed the possible need for cardiology referral if all of the above reveals nothing of significance.   I discussed the assessment and treatment plan with the patient. The patient was provided  an opportunity to ask questions and all were answered. The patient agreed with the plan and demonstrated an understanding of the instructions.  I provided 20 minutes of non-face-to-face time during this encounter.   Megan Salon, MD

## 2019-08-29 ENCOUNTER — Other Ambulatory Visit (INDEPENDENT_AMBULATORY_CARE_PROVIDER_SITE_OTHER): Payer: BC Managed Care – PPO

## 2019-08-29 DIAGNOSIS — R5383 Other fatigue: Secondary | ICD-10-CM

## 2019-08-30 LAB — PROGESTERONE: Progesterone: 1.2 ng/mL

## 2019-08-30 LAB — TSH: TSH: 2.58 u[IU]/mL (ref 0.450–4.500)

## 2019-08-30 LAB — ESTRADIOL: Estradiol: 14.3 pg/mL

## 2019-08-30 LAB — VITAMIN D 25 HYDROXY (VIT D DEFICIENCY, FRACTURES): Vit D, 25-Hydroxy: 45.3 ng/mL (ref 30.0–100.0)

## 2019-09-04 ENCOUNTER — Ambulatory Visit: Payer: BC Managed Care – PPO | Admitting: Neurology

## 2019-09-04 ENCOUNTER — Encounter: Payer: Self-pay | Admitting: Neurology

## 2019-09-04 ENCOUNTER — Other Ambulatory Visit: Payer: Self-pay

## 2019-09-04 VITALS — BP 124/77 | HR 62 | Temp 96.6°F | Ht 69.0 in | Wt 150.0 lb

## 2019-09-04 DIAGNOSIS — K219 Gastro-esophageal reflux disease without esophagitis: Secondary | ICD-10-CM

## 2019-09-04 DIAGNOSIS — G4733 Obstructive sleep apnea (adult) (pediatric): Secondary | ICD-10-CM | POA: Diagnosis not present

## 2019-09-04 DIAGNOSIS — R419 Unspecified symptoms and signs involving cognitive functions and awareness: Secondary | ICD-10-CM

## 2019-09-04 DIAGNOSIS — Z658 Other specified problems related to psychosocial circumstances: Secondary | ICD-10-CM

## 2019-09-04 DIAGNOSIS — F5104 Psychophysiologic insomnia: Secondary | ICD-10-CM

## 2019-09-04 NOTE — Progress Notes (Signed)
SLEEP MEDICINE CLINIC    Provider:  Larey Seat, MD  Primary Care Physician:     Referring Provider: Dr. Nino Glow, MD         Chief Complaint according to patient   Patient presents with:    . New Patient (Initial Visit)     pt alone, rm 10. pt states that she had a SS previously in 2018 and that she attempted CPAP and mouth guard but neither of those worked. She is noticing that without treatment she has developed memory concerns. she is interested in learning more about inspire.      HISTORY OF PRESENT ILLNESS:  Jill Mcintyre is a 59  year old Caucasian female patient seen here upon referral on 09/04/2019 from Dr. Janeann Merl, MD Gyn.  She reported getting lost after she picked her husband up from the hospital, her son is worried. Her PCP thinks its poor sleep quality, and daytime fatigue.  She had Covid Dec 10th, and returned to work on [employer]. Her co worker and boss all got it.   I have the pleasure of seeing Jill Mcintyre today, a right -handed  Caucasian female with a possible sleep disorder. She has seen Dr. Ellouise Newer for memory loss and was not given a diagnosis.  She   has a past medical history of  Acne, Anemia, Anxiety, Bipolar 1 disorder ([place]), Bipolar depression ([place]), Constipation, GERD (gastroesophageal reflux disease), Herpes simplex, IBS (irritable bowel syndrome), Insomnia, Migraines, and Pneumonia (2019), celiac disease-.   The patient had the first sleep study in the year 2018  with a result of OSA , given a CPAP, didn't tolerate it, tried a mouth guard- but continues to struggle with Insomnia. She is biting and clenching. She is untreated for apnea.     Jill Lever, MD   07/18/2016 10:58 AM     Patient Name: Jill Mcintyre, Jill Mcintyre Date: 06/30/2016  Gender: Female  D.O.B: [birth year]  Age (years): 59  Referring Provider: Melida Quitter  Height (inches): 47  Interpreting Physician: Baird Lyons MD,  ABSM  Weight (lbs): 138  RPSGT: Jacolyn Reedy  BMI: 21  MRN: [id number]  Neck Size: 12.00  CLINICAL INFORMATION  Sleep Study Type: unattended HST  Indication for sleep study: OSA    SLEEP STUDY TECHNIQUE  A multi-channel overnight portable sleep study was performed. The  channels recorded were: nasal airflow, thoracic respiratory  movement, and oxygen saturation with a pulse oximetry. Snoring  was also monitored.   MEDICATIONS  Patient self administered medications include: none reported   SLEEP ARCHITECTURE  Patient was studied for 345.2 minutes. The sleep efficiency was  100.0 % and the patient was supine for 99.9%. The arousal index  was 0.0 per hour.   RESPIRATORY PARAMETERS  The overall AHI was 31.6 per hour, with a central apnea index of  0.3 per hour.   The oxygen nadir was 79% during sleep.   CARDIAC DATA  Mean heart rate during sleep was 71.4 bpm.   IMPRESSIONS  - Severe obstructive sleep apnea occurred during this study (AHI  = 31.6/h).  - No significant central sleep apnea occurred during this study  (CAI = 0.3/h).  - Severe oxygen desaturation was noted during this study (Min O2  = 79%, Mean saturation 94%).  - Patient snored.   DIAGNOSIS  - Obstructive Sleep Apnea (327.23 [G47.33 ICD-10])   RECOMMENDATIONS  - CPAP titration is usually first choice  for scores in this  range.  - Positional therapy avoiding supine position during sleep.  - Avoid alcohol, sedatives and other CNS depressants that may  worsen sleep apnea and disrupt normal sleep architecture.  - Sleep hygiene should be reviewed to assess factors that may  improve sleep quality.  - Weight management and regular exercise should be initiated or  continued.   [Electronically signed] 07/18/2016 10:56 AM  Baird Lyons MD, ABSM  Diplomate, American Board of Sleep Medicine  NPI: [id number]  Jill Mcintyre   Sleep relevant medical history: untreated severe OSA,  Nocturia up to three  times, vivid dreams,   cervical spine surgery, no ENT surgery.     Family medical /sleep history: No other family member on CPAP with OSA,  Mother has been a loud snorer, and has liver cancer. Age 86. No insomnia    Social history:  Patient is working as an Lobbyist.  Desk job. She lives in a household with her son  , age 68 . Has had IBS, constipation.  Family status is separated , with  2 adult children.  The patient currently works daytime. Pets are present, just lost a dog. . Tobacco use: never .  ETOH use; socially - and at least 1 a day.  Caffeine intake in form of Coffee( one in am ) Soda( none) Tea ( none) or energy drinks.       Sleep habits are as follows:  she takes trazodone because it prevents her waking up every night at 3 AM- patient's dinner time is between 6-10  PM. The patient goes to bed at 11PM- 1 AM   and continues to sleep for 5 hours,  Has GERD, IBS, wakes for bathroom breaks..   The preferred sleep position is left sided , with the support of 3 pillows. Dreams are reportedly frequent/vivid.  7 AM is the usual rise time. The patient wakes up spontaneously much earlier.  She reports not feeling refreshed or restored in AM, with symptoms such as dry mouth, morning headaches, and residual fatigue. She overslept many times in February-  Feeling depressed.  Naps are taken frequently, lasting from 30 to 45 minutes and are more refreshing than nocturnal sleep.    Review of Systems: Out of a complete 14 system review, the patient complains of only the following symptoms, and all other reviewed systems are negative.:  Fatigue, sleepiness ,  snoring, dry mouth, fragmented sleep, Insomnia- early morning awakenings.    How likely are you to doze in the following situations: 0 = not likely, 1 = slight chance, 2 = moderate chance, 3 = high chance   Sitting and Reading? Watching Television? Sitting inactive in a public place (theater or meeting)? As a passenger in a car  for an hour without a break? Lying down in the afternoon when circumstances permit? Sitting and talking to someone? Sitting quietly after lunch without alcohol? In a car, while stopped for a few minutes in traffic?   Total = 11/ 24 points   FSS endorsed at 35/ 63 points.   Social History   Socioeconomic History  . Marital status: Legally Separated    Spouse name: Not on file  . Number of children: Not on file  . Years of education: Not on file  . Highest education level: Not on file  Occupational History  . Not on file  Tobacco Use  . Smoking status: Former Smoker    Start date: 10/07/1982  . Smokeless tobacco: Never  Used  Substance and Sexual Activity  . Alcohol use: Yes    Alcohol/week: 0.0 standard drinks    Comment: occ  . Drug use: No  . Sexual activity: Yes    Partners: Male    Birth control/protection: Post-menopausal    Comment: partner vasectomy  Other Topics Concern  . Not on file  Social History Narrative   Pt lives alone in single story home   Has 2 children   12th grade education   Works for [employer].    Social Determinants of Health   Financial Resource Strain:   . Difficulty of Paying Living Expenses:   Food Insecurity:   . Worried About Charity fundraiser in the Last Year:   . Arboriculturist in the Last Year:   Transportation Needs:   . Film/video editor (Medical):   Marland Kitchen Lack of Transportation (Non-Medical):   Physical Activity:   . Days of Exercise per Week:   . Minutes of Exercise per Session:   Stress:   . Feeling of Stress :   Social Connections:   . Frequency of Communication with Friends and Family:   . Frequency of Social Gatherings with Friends and Family:   . Attends Religious Services:   . Active Member of Clubs or Organizations:   . Attends Archivist Meetings:   Marland Kitchen Marital Status:     Family History  Problem Relation Age of Onset  . Cancer Mother   . Alcohol abuse Other   . Arthritis Other     . Cancer Other        breast, ovrarian, uterine    Past Medical History:  Diagnosis Date  . Abnormal Pap smear of cervix 2013   ASCUS  . Acne    sees Dr. Jarome Matin for skin checks and acne  . Anemia   . Anxiety   . Bipolar 1 disorder ([place])   . Bipolar depression ([place])    sees Dr G[facility] Murdoch [facility] and sees Trey Paula for  therapy  . Constipation   . GERD (gastroesophageal reflux disease)   . Herpes simplex   . IBS (irritable bowel syndrome)   . Insomnia   . Migraines   . Pneumonia 2019    Past Surgical History:  Procedure Laterality Date  . BASAL CELL CARCINOMA EXCISION     Dr Amy Martinique  . BREAST ENHANCEMENT SURGERY    . CESAREAN SECTION  1996  . COLONOSCOPY  2015   per Dr. Collene Mares, clear, repeat in 10 yrs   . COLPOSCOPY  10/07/11   CIN I  . ESOPHAGOGASTRODUODENOSCOPY  09/07/2006   Dr Ardis Hughs     Current Outpatient Medications on File Prior to Visit  Medication Sig Dispense Refill  . ALPRAZolam (XANAX) 0.25 MG tablet Take 0.25 mg by mouth daily as needed.     . cholecalciferol (VITAMIN D3) 25 MCG (1000 UT) tablet Take 1,000 Units by mouth daily.    . clobetasol (TEMOVATE) 0.05 % external solution APPLY TO AFFECTED AREA TWICE A DAY 50 mL 2  . diazepam (VALIUM) 5 MG tablet TAKE 1 TABLET TWICE A DAY FOR ANXIETY (Patient taking differently: Take 5 mg by mouth daily as needed. ) 180 tablet 1  . estradiol (VIVELLE-DOT) 0.075 MG/24HR Place 1 patch onto the skin 2 (two) times a week. 24 patch 4  . ibuprofen (ADVIL,MOTRIN) 800 MG tablet TAKE 1 TABLET (800 MG TOTAL) BY MOUTH EVERY 8 (EIGHT) HOURS  AS NEEDED FOR MODERATE PAIN. 60 tablet 5  . lamoTRIgine (LAMICTAL) 100 MG tablet Take 100 mg by mouth daily.     . Multiple Vitamin (MULTI-VITAMIN PO) Take by mouth daily.    Marland Kitchen omeprazole (PRILOSEC) 40 MG capsule     . progesterone (PROMETRIUM) 200 MG capsule TAKE 1 CAPSULE BY MOUTH EVERYDAY AT BEDTIME (Patient taking differently: TAKE 1 CAPSULE BY MOUTH EVERYDAY  1st-15th of every month) 90 capsule 4  . sertraline (ZOLOFT) 100 MG tablet     . traMADol (ULTRAM) 50 MG tablet Take 1 tablet (50 mg total) by mouth every 6 (six) hours as needed. TAKE 1/2 TABLET BY MOUTH EVERY 8 HOURS AS NEEDED 60 tablet 0  . traZODone (DESYREL) 50 MG tablet TAKE 1-2 TABLET AT BEDTIME AS NEEDED FOR SLEEP  2  . valACYclovir (VALTREX) 500 MG tablet Take 1 tablet (500 mg total) by mouth 2 (two) times daily. 180 tablet 3   No current facility-administered medications on file prior to visit.    Allergies  Allergen Reactions  . Ciprofloxacin Hives  . Sulfa Antibiotics     hives    Physical exam:  Today's Vitals   09/04/19 1519  BP: 124/77  Pulse: 62  Temp: (!) 96.6 F (35.9 C)  Weight: 150 lb (68 kg)  Height: 5' 9"  (1.753 m)   Body mass index is 22.15 kg/m.   Wt Readings from Last 3 Encounters:  09/04/19 150 lb (68 kg)  04/27/19 148 lb 12.8 oz (67.5 kg)  02/07/19 151 lb 12.8 oz (68.9 kg)     Ht Readings from Last 3 Encounters:  09/04/19 5' 9"  (1.753 m)  04/27/19 5' 8"  (1.727 m)  02/07/19 5' 8"  (1.727 m)      General: The patient is awake, alert and appears not in acute distress. The patient is well groomed. Head: Normocephalic, atraumatic. Neck is supple. Mallampati 3, frequent sinus infections.  neck circumference:13. 5  inches . Nasal airflow patent.  Dental status: bruxism.  Cardiovascular:  Regular rate and cardiac rhythm by pulse,  without distended neck veins. Respiratory: Lungs are clear to auscultation.  Skin:  Without evidence of ankle edema, or rash. Trunk: The patient's posture is erect.   Neurologic exam : The patient is awake and alert, oriented to place and time.   Memory subjective described as intact.  Attention span & concentration ability appears normal.  Speech is fluent,  without  dysarthria, dysphonia or aphasia.  Mood and affect are appropriate.   Cranial nerves: no loss of smell or taste reported  Pupils are equal and  briskly reactive to light. Funduscopic exam deferred.   Extraocular movements in vertical and horizontal planes were intact and without nystagmus. No Diplopia. Visual fields by finger perimetry are intact. Hearing was intact to soft voice and finger rubbing.   Facial sensation intact to fine touch. Facial motor strength is symmetric and tongue and uvula move midline.  Neck ROM : rotation, tilt and flexion extension were normal for age and shoulder shrug was symmetrical.  Motor exam:  Symmetric bulk, tone and ROM.   Normal tone without cog wheeling, symmetric grip strength .  Sensory:  Fine touch, pinprick and vibration were tested  and  normal.  Proprioception tested in the upper extremities was normal.  Coordination: Rapid alternating movements in the fingers/hands were of normal speed.  The Finger-to-nose maneuver was intact without evidence of ataxia, dysmetria or tremor. Gait and station: Patient could rise unassisted from a seated position,  walked without assistive device.  Stance is of normal width/ base and the patient turned with 3 steps.  Toe and heel walk were deferred.  Deep tendon reflexes: in the  upper and lower extremities are symmetric and intact.  Babinski response was deferred.      After spending a total time of  46  minutes face to face and additional time for physical and neurologic examination, review of laboratory studies,  personal review of imaging studies, reports and results of other testing and review of referral information / records as far as provided in visit, I have established the following assessments:  1) the patient reports chronic insomnia which could be related to depression or anxiety as she describes early morning awakening and sometimes being unable to initiate sleep as well.  Chronic insomnia is usually not of organic origin. 2) the patient was already diagnosed with obstructive sleep apnea at a surprisingly high degree.  She does have a 3+ Mallampati,  and she has in the past frequently had sinus infections.  She did not tolerate using CPAP.  She was given a bite guard and dental guard by her dentist, but I am not sure that this is actually made for apnea treatment. 3) memory loss which seems to be sporadic has been addressed by Dr. Delice Lesch and she worked the patient up but found no diagnosis. She has been worked up for ADHD- reportedly negative. There is a family history of ADHD.    My Plan is to proceed with:  1) Attended sleep study.HST was 2 years ago positive,  2) if CPAP is indicated, lets start on auto- lets see if she can be fitted with a smaller , easy use mask.  3) if dental device is indicated , consider that.  4) Inspire - she is interested, Review with Dr. Redmond Baseman.   I would like to thank Dr. Rona Ravens for allowing me to meet with and to take care of this pleasant patient.   In short, Jill Mcintyre is presenting with insomnia and untreated apnea , a symptom that can be attributed.    I plan to follow up either personally or through our NP in 2-3 month  CC: I will share my notes with PCP.  Electronically signed by: Larey Seat, MD 09/04/2019 3:33 PM  [facility] Sleep Board certified by The [employer] of Sleep Medicine and Diplomate of the [employer] of Sleep Medicine. Board certified In Neurology through the [place], Fellow of the [employer] of Neurology. Medical Director of [employer].

## 2019-09-04 NOTE — Patient Instructions (Signed)

## 2019-09-05 ENCOUNTER — Telehealth: Payer: Self-pay

## 2019-09-05 ENCOUNTER — Other Ambulatory Visit: Payer: Self-pay | Admitting: Neurology

## 2019-09-05 DIAGNOSIS — G4733 Obstructive sleep apnea (adult) (pediatric): Secondary | ICD-10-CM

## 2019-09-05 DIAGNOSIS — Z658 Other specified problems related to psychosocial circumstances: Secondary | ICD-10-CM

## 2019-09-05 DIAGNOSIS — F5104 Psychophysiologic insomnia: Secondary | ICD-10-CM

## 2019-09-05 DIAGNOSIS — K219 Gastro-esophageal reflux disease without esophagitis: Secondary | ICD-10-CM

## 2019-09-05 DIAGNOSIS — R413 Other amnesia: Secondary | ICD-10-CM

## 2019-09-05 NOTE — Telephone Encounter (Signed)
BCBS denied in lab sleep study. Need HST order.

## 2019-09-05 NOTE — Telephone Encounter (Signed)
Order was placed for the patient.

## 2019-09-07 DIAGNOSIS — F3181 Bipolar II disorder: Secondary | ICD-10-CM | POA: Diagnosis not present

## 2019-09-25 ENCOUNTER — Ambulatory Visit (INDEPENDENT_AMBULATORY_CARE_PROVIDER_SITE_OTHER): Payer: BC Managed Care – PPO | Admitting: Neurology

## 2019-09-25 DIAGNOSIS — G4733 Obstructive sleep apnea (adult) (pediatric): Secondary | ICD-10-CM | POA: Diagnosis not present

## 2019-09-25 DIAGNOSIS — F5104 Psychophysiologic insomnia: Secondary | ICD-10-CM

## 2019-09-25 DIAGNOSIS — Z658 Other specified problems related to psychosocial circumstances: Secondary | ICD-10-CM

## 2019-09-25 DIAGNOSIS — K219 Gastro-esophageal reflux disease without esophagitis: Secondary | ICD-10-CM

## 2019-09-25 DIAGNOSIS — G4730 Sleep apnea, unspecified: Secondary | ICD-10-CM

## 2019-09-25 DIAGNOSIS — R413 Other amnesia: Secondary | ICD-10-CM

## 2019-09-25 DIAGNOSIS — G4710 Hypersomnia, unspecified: Secondary | ICD-10-CM

## 2019-09-25 DIAGNOSIS — Z789 Other specified health status: Secondary | ICD-10-CM

## 2019-10-04 ENCOUNTER — Ambulatory Visit (INDEPENDENT_AMBULATORY_CARE_PROVIDER_SITE_OTHER): Payer: BC Managed Care – PPO | Admitting: Psychology

## 2019-10-04 DIAGNOSIS — F312 Bipolar disorder, current episode manic severe with psychotic features: Secondary | ICD-10-CM | POA: Diagnosis not present

## 2019-10-05 ENCOUNTER — Telehealth: Payer: Self-pay | Admitting: *Deleted

## 2019-10-05 ENCOUNTER — Telehealth: Payer: Self-pay | Admitting: Neurology

## 2019-10-05 DIAGNOSIS — R413 Other amnesia: Secondary | ICD-10-CM | POA: Insufficient documentation

## 2019-10-05 DIAGNOSIS — G4710 Hypersomnia, unspecified: Secondary | ICD-10-CM | POA: Insufficient documentation

## 2019-10-05 DIAGNOSIS — F3181 Bipolar II disorder: Secondary | ICD-10-CM | POA: Diagnosis not present

## 2019-10-05 DIAGNOSIS — Z789 Other specified health status: Secondary | ICD-10-CM | POA: Insufficient documentation

## 2019-10-05 DIAGNOSIS — G4730 Sleep apnea, unspecified: Secondary | ICD-10-CM | POA: Insufficient documentation

## 2019-10-05 DIAGNOSIS — F5104 Psychophysiologic insomnia: Secondary | ICD-10-CM | POA: Insufficient documentation

## 2019-10-05 NOTE — Procedures (Signed)
UNSUPPORTED IMAGE(!) Please look under MEDIA tap to find scanned copy.

## 2019-10-05 NOTE — Telephone Encounter (Signed)
Call placed to patient. Confirmed sleep study completed on 09/04/19, notes in [place]. Advised patient I will forward for Dr. Sabra Heck to review and make recommendations on HRT. Patient verbalizes understanding and is agreeable.   Routing to Dr. Sabra Heck.

## 2019-10-05 NOTE — Addendum Note (Signed)
Addended by: Larey Seat on: 10/05/2019 05:50 PM   Modules accepted: Orders

## 2019-10-05 NOTE — Progress Notes (Signed)
I would refer to ENT unless she is willing to try CPAP another time .

## 2019-10-05 NOTE — Telephone Encounter (Signed)
Patient wanting to know if Dr. Sabra Heck received sleep study results.

## 2019-10-05 NOTE — Telephone Encounter (Signed)
I had to scan the report under the MEDIA tap in [facility] , a direct copy and paste was not possible.   Ibn short, the patient still has OSA- AHI of about 20/h , moderate degree, and she has no complicating factors such as hypoxia, cardiac rate abnormality or REM sleep dependent apnea.   I will refer to ENT for INSPIRE   Larey Seat, MD

## 2019-10-06 ENCOUNTER — Telehealth: Payer: Self-pay | Admitting: Neurology

## 2019-10-06 NOTE — Telephone Encounter (Signed)
-----   Message from Larey Seat, MD sent at 10/05/2019  5:50 PM EDT ----- I would refer to ENT unless she is willing to try CPAP another time .

## 2019-10-06 NOTE — Telephone Encounter (Signed)
Could you please confirm what dosage of vivelle dot patch she is using?  She was using 0.066m and cutting them in half but I also have a note that she is using the 0.0253mpatches.  I will increase the dosage once I know confirm what she is using.  Thanks.

## 2019-10-06 NOTE — Telephone Encounter (Signed)
Called the patient and reviewed the sleep study results. Informed her that she does have moderate sleep apnea. Reviewed the study in detail. Advised that Dr Brett Fairy recommends using a CPAP or a ENT referral for inspire. Pt opt'ed the inspire procedure route for now. Advised the referral will be placed and patient should expect a call from someone to get her scheduled. Pt verbalized understanding. Pt had no questions at this time but was encouraged to call back if questions arise.

## 2019-10-09 ENCOUNTER — Telehealth: Payer: Self-pay | Admitting: Family Medicine

## 2019-10-09 NOTE — Telephone Encounter (Signed)
Yes she should get the vaccine

## 2019-10-09 NOTE — Telephone Encounter (Signed)
Patient is using the 0.075 mg patch and cutting into thirds. Does not need RX and does not want to change dose. Will plan to call ENT and for management of sleep study results.  Dr Sabra Heck- [facility] [place].

## 2019-10-09 NOTE — Telephone Encounter (Signed)
Left message for patient to call back  

## 2019-10-09 NOTE — Telephone Encounter (Signed)
Pt would like to know if she is ok to get the COVID-19 vaccine? She is scheduled at [facility] today at [place] and it is [employer], so she will need a call back today.    Pt can be reached at [phone] -ok to leave detailed message per pt

## 2019-10-09 NOTE — Telephone Encounter (Signed)
Pt called back and I informed her of Fry's message. She understood and nothing further

## 2019-10-18 ENCOUNTER — Other Ambulatory Visit: Payer: Self-pay | Admitting: Family Medicine

## 2019-10-18 DIAGNOSIS — Z1231 Encounter for screening mammogram for malignant neoplasm of breast: Secondary | ICD-10-CM

## 2019-10-26 ENCOUNTER — Other Ambulatory Visit: Payer: Self-pay

## 2019-10-27 ENCOUNTER — Ambulatory Visit (INDEPENDENT_AMBULATORY_CARE_PROVIDER_SITE_OTHER): Payer: BC Managed Care – PPO | Admitting: Family Medicine

## 2019-10-27 ENCOUNTER — Encounter: Payer: Self-pay | Admitting: Family Medicine

## 2019-10-27 VITALS — BP 110/60 | HR 79 | Temp 97.6°F | Wt 151.8 lb

## 2019-10-27 DIAGNOSIS — F900 Attention-deficit hyperactivity disorder, predominantly inattentive type: Secondary | ICD-10-CM | POA: Diagnosis not present

## 2019-10-27 MED ORDER — TRAMADOL HCL 50 MG PO TABS: 100.0000 mg | ORAL_TABLET | Freq: Four times a day (QID) | ORAL | 2 refills | Status: DC | PRN

## 2019-10-27 MED ORDER — METHYLPHENIDATE HCL ER (OSM) 18 MG PO TBCR: 18.0000 mg | EXTENDED_RELEASE_TABLET | Freq: Every day | ORAL | 0 refills | Status: DC

## 2019-10-27 NOTE — Progress Notes (Signed)
   Subjective:    Patient ID: Jill Mcintyre, female    DOB: [birth year], 59 y.o.   MRN: [id number]  HPI Here for treatment of ADHD. She was diagnosed with this years ago but has never been treated. We gave her some Concerta to try some years ago, but she never filled the prescription. She describes classic trouble with focus at home and at work. She jumps from task to task, and she has trouble ever completing some tasks. She cannot focus on reading for more than a few minutes without her mind wandering.    Review of Systems  Constitutional: Negative.   Respiratory: Negative.   Cardiovascular: Negative.   Psychiatric/Behavioral: Positive for decreased concentration. Negative for agitation, confusion and dysphoric mood. The patient is not nervous/anxious.        Objective:   Physical Exam Constitutional:      Appearance: Normal appearance.  Cardiovascular:     Rate and Rhythm: Normal rate and regular rhythm.     Pulses: Normal pulses.     Heart sounds: Normal heart sounds.  Pulmonary:     Effort: Pulmonary effort is normal.     Breath sounds: Normal breath sounds.  Neurological:     General: No focal deficit present.     Mental Status: She is alert and oriented to person, place, and time.  Psychiatric:        Mood and Affect: Mood normal.        Behavior: Behavior normal.        Thought Content: Thought content normal.        Judgment: Judgment normal.           Assessment & Plan:  ADHD, try Concerta 18 mg daily. Recheck in 3-4 weeks.  Alysia Penna, MD

## 2019-10-30 ENCOUNTER — Encounter: Payer: Self-pay | Admitting: Family Medicine

## 2019-10-31 NOTE — Telephone Encounter (Signed)
These should work at full strength the first day she takes it. Tell her to try taking 3 pills (a total of 54 mg) each morning and let me know how this works

## 2019-11-01 ENCOUNTER — Encounter: Payer: Self-pay | Admitting: Family Medicine

## 2019-11-01 MED ORDER — AMPHETAMINE-DEXTROAMPHET ER 20 MG PO CP24: 20.0000 mg | ORAL_CAPSULE | Freq: Every day | ORAL | 0 refills | Status: DC

## 2019-11-01 NOTE — Telephone Encounter (Signed)
She apparently does not respond to medications in the Ritalin family. Stop this and try Adderall XR 20 mg daily. I sent in #30 for her to try

## 2019-11-10 ENCOUNTER — Other Ambulatory Visit: Payer: Self-pay

## 2019-11-10 ENCOUNTER — Ambulatory Visit
Admission: RE | Admit: 2019-11-10 | Discharge: 2019-11-10 | Disposition: A | Discharge status: Home / Self Care | Payer: BC Managed Care – PPO | Source: Ambulatory Visit | Attending: Family Medicine | Admitting: Family Medicine

## 2019-11-10 DIAGNOSIS — Z1231 Encounter for screening mammogram for malignant neoplasm of breast: Secondary | ICD-10-CM | POA: Diagnosis not present

## 2019-11-10 LAB — HM MAMMOGRAPHY

## 2019-11-13 ENCOUNTER — Other Ambulatory Visit: Payer: Self-pay | Admitting: Family Medicine

## 2019-11-14 NOTE — Telephone Encounter (Signed)
Please advise 

## 2019-11-21 ENCOUNTER — Other Ambulatory Visit: Payer: Self-pay

## 2019-11-22 ENCOUNTER — Ambulatory Visit (INDEPENDENT_AMBULATORY_CARE_PROVIDER_SITE_OTHER): Payer: BC Managed Care – PPO | Admitting: Family Medicine

## 2019-11-22 ENCOUNTER — Encounter: Payer: Self-pay | Admitting: Family Medicine

## 2019-11-22 ENCOUNTER — Other Ambulatory Visit: Payer: Self-pay

## 2019-11-22 VITALS — BP 108/60 | HR 79 | Temp 98.2°F | Wt 145.8 lb

## 2019-11-22 DIAGNOSIS — F900 Attention-deficit hyperactivity disorder, predominantly inattentive type: Secondary | ICD-10-CM | POA: Diagnosis not present

## 2019-11-22 MED ORDER — AMPHETAMINE-DEXTROAMPHETAMINE 30 MG PO TABS
30.0000 mg | ORAL_TABLET | Freq: Two times a day (BID) | ORAL | 0 refills | Status: DC
Start: 2019-11-22 — End: 2020-02-20

## 2019-11-22 NOTE — Progress Notes (Signed)
   Subjective:    Patient ID: Jill Mcintyre, female    DOB: [birth year], 59 y.o.   MRN: [id number]  HPI Here to follow up on ADHD. She has tried Concerta and Adderall XR, but she says she never felt much of an effect. No side effects to report.    Review of Systems  Constitutional: Negative.   Respiratory: Negative.   Cardiovascular: Negative.   Neurological: Negative.        Objective:   Physical Exam Constitutional:      Appearance: Normal appearance.  Cardiovascular:     Rate and Rhythm: Normal rate and regular rhythm.     Pulses: Normal pulses.     Heart sounds: Normal heart sounds.  Pulmonary:     Effort: Pulmonary effort is normal.     Breath sounds: Normal breath sounds.  Neurological:     General: No focal deficit present.     Mental Status: She is alert and oriented to person, place, and time.           Assessment & Plan:  ADHD, we will swithc to immediate release Adderall 30 mg to dose BID as needed. Alysia Penna, MD

## 2019-12-12 DIAGNOSIS — F3181 Bipolar II disorder: Secondary | ICD-10-CM | POA: Diagnosis not present

## 2019-12-29 ENCOUNTER — Ambulatory Visit (INDEPENDENT_AMBULATORY_CARE_PROVIDER_SITE_OTHER): Payer: BC Managed Care – PPO | Admitting: Psychology

## 2019-12-29 DIAGNOSIS — F312 Bipolar disorder, current episode manic severe with psychotic features: Secondary | ICD-10-CM | POA: Diagnosis not present

## 2020-01-09 ENCOUNTER — Ambulatory Visit (INDEPENDENT_AMBULATORY_CARE_PROVIDER_SITE_OTHER): Payer: BC Managed Care – PPO | Admitting: Psychology

## 2020-01-09 DIAGNOSIS — F312 Bipolar disorder, current episode manic severe with psychotic features: Secondary | ICD-10-CM

## 2020-01-09 DIAGNOSIS — F3181 Bipolar II disorder: Secondary | ICD-10-CM | POA: Diagnosis not present

## 2020-01-09 DIAGNOSIS — F1410 Cocaine abuse, uncomplicated: Secondary | ICD-10-CM | POA: Diagnosis not present

## 2020-01-10 ENCOUNTER — Other Ambulatory Visit: Payer: Self-pay | Admitting: Family Medicine

## 2020-01-10 NOTE — Telephone Encounter (Signed)
Last filled 06/13/2019 Last OV 11/22/2019  Ok to fill?

## 2020-01-11 ENCOUNTER — Ambulatory Visit: Payer: BC Managed Care – PPO | Admitting: Psychology

## 2020-01-15 ENCOUNTER — Other Ambulatory Visit: Payer: Self-pay | Admitting: Obstetrics & Gynecology

## 2020-01-15 DIAGNOSIS — N950 Postmenopausal bleeding: Secondary | ICD-10-CM

## 2020-01-15 NOTE — Telephone Encounter (Signed)
Medication refill request: Progesterone 254m Last OV: 08/28/19 Next OV: not on the schedule yet  Last MMG (if hormonal medication request): 11/10/19   Normal  Refill authorized: #90/3 refills   Pt states she will schedule her appt closer to the date needed so she can better know her availability.   SYork Cerise CMA

## 2020-01-17 DIAGNOSIS — Z20822 Contact with and (suspected) exposure to covid-19: Secondary | ICD-10-CM | POA: Diagnosis not present

## 2020-01-17 DIAGNOSIS — G500 Trigeminal neuralgia: Secondary | ICD-10-CM | POA: Diagnosis not present

## 2020-01-17 DIAGNOSIS — J069 Acute upper respiratory infection, unspecified: Secondary | ICD-10-CM | POA: Diagnosis not present

## 2020-01-22 ENCOUNTER — Ambulatory Visit (INDEPENDENT_AMBULATORY_CARE_PROVIDER_SITE_OTHER): Payer: BC Managed Care – PPO | Admitting: Psychology

## 2020-01-22 DIAGNOSIS — F312 Bipolar disorder, current episode manic severe with psychotic features: Secondary | ICD-10-CM | POA: Diagnosis not present

## 2020-01-23 ENCOUNTER — Encounter: Payer: Self-pay | Admitting: Family Medicine

## 2020-01-23 MED ORDER — VALACYCLOVIR HCL 500 MG PO TABS: 500.0000 mg | ORAL_TABLET | Freq: Two times a day (BID) | ORAL | 0 refills | Status: DC

## 2020-01-25 ENCOUNTER — Ambulatory Visit: Payer: BC Managed Care – PPO | Admitting: Psychology

## 2020-02-20 ENCOUNTER — Telehealth (INDEPENDENT_AMBULATORY_CARE_PROVIDER_SITE_OTHER): Payer: BC Managed Care – PPO | Admitting: Family Medicine

## 2020-02-20 ENCOUNTER — Encounter: Payer: Self-pay | Admitting: Family Medicine

## 2020-02-20 VITALS — HR 71 | Temp 98.8°F

## 2020-02-20 DIAGNOSIS — R0981 Nasal congestion: Secondary | ICD-10-CM

## 2020-02-20 DIAGNOSIS — R519 Headache, unspecified: Secondary | ICD-10-CM | POA: Diagnosis not present

## 2020-02-20 MED ORDER — DOXYCYCLINE HYCLATE 100 MG PO TABS: 100.0000 mg | ORAL_TABLET | Freq: Two times a day (BID) | ORAL | 0 refills | Status: DC

## 2020-02-20 NOTE — Progress Notes (Signed)
Virtual Visit via Video Note  I connected with Jill Mcintyre  on 02/20/20 at  4:40 PM EDT by a video enabled telemedicine application and verified that I am speaking with the correct person using two identifiers.  Location patient: home, [place] Location provider:work or home office Persons participating in the virtual visit: patient, provider  I discussed the limitations of evaluation and management by telemedicine and the availability of in person appointments. The patient expressed understanding and agreed to proceed.   HPI:  Acute visit for sinus congestion: -started last week, but has chronic nasal congestion with allergies -symptoms include sinus congestion, some ear discomfort, cough, mild diarrhea, decreased appetite, now some sinus discomfort, thick sinus congestion, mild dizziness at times -fully vaccinated for covid19 and has had COVID19 in the past -denies fevers, SOB, CP, HAs, body aches, NV, known sick contacts -wants to do COVID19 test, but couldn't find test site with opening -reports has hx of sinusitis and this feels like a sinus infection   ROS: See pertinent positives and negatives per HPI.  Past Medical History:  Diagnosis Date  . Abnormal Pap smear of cervix 2013   ASCUS  . Acne    sees Dr. Jarome Matin for skin checks and acne  . Anemia   . Anxiety   . Bipolar 1 disorder ([place])   . Bipolar depression [facility])    sees Pauline Good NP and sees Trey Paula for  therapy  . Constipation   . GERD (gastroesophageal reflux disease)   . Herpes simplex   . IBS (irritable bowel syndrome)   . Insomnia   . Migraines   . Pneumonia 2019    Past Surgical History:  Procedure Laterality Date  . BASAL CELL CARCINOMA EXCISION     Dr Amy Martinique  . BREAST ENHANCEMENT SURGERY    . CESAREAN SECTION  1996  . COLONOSCOPY  2015   per Dr. Collene Mares, clear, repeat in 10 yrs   . COLPOSCOPY  10/07/11   CIN I  . ESOPHAGOGASTRODUODENOSCOPY  09/07/2006   Dr Ardis Hughs    Family History  Problem  Relation Age of Onset  . Cancer Mother   . Alcohol abuse Other   . Arthritis Other   . Cancer Other        breast, ovrarian, uterine    SOCIAL HX: see hpi   Current Outpatient Medications:  .  ALPRAZolam (XANAX) 0.25 MG tablet, Take 0.25 mg by mouth daily as needed. , Disp: , Rfl:  .  cholecalciferol (VITAMIN D3) 25 MCG (1000 UT) tablet, Take 1,000 Units by mouth daily., Disp: , Rfl:  .  clobetasol (TEMOVATE) 0.05 % external solution, APPLY TO AFFECTED AREA TWICE A DAY, Disp: 50 mL, Rfl: 2 .  diazepam (VALIUM) 5 MG tablet, TAKE 1 TABLET TWICE A DAY FOR ANXIETY, Disp: 180 tablet, Rfl: 1 .  estradiol (VIVELLE-DOT) 0.075 MG/24HR, Place 1 patch onto the skin 2 (two) times a week., Disp: 24 patch, Rfl: 4 .  ibuprofen (ADVIL,MOTRIN) 800 MG tablet, TAKE 1 TABLET (800 MG TOTAL) BY MOUTH EVERY 8 (EIGHT) HOURS AS NEEDED FOR MODERATE PAIN., Disp: 60 tablet, Rfl: 5 .  lamoTRIgine (LAMICTAL) 100 MG tablet, Take 200 mg by mouth daily. , Disp: , Rfl:  .  Multiple Vitamin (MULTI-VITAMIN PO), Take by mouth daily., Disp: , Rfl:  .  progesterone (PROMETRIUM) 200 MG capsule, TAKE 1 CAPSULE BY MOUTH EVERYDAY AT BEDTIME (Patient taking differently: TAKE 1 CAPSULE BY MOUTH EVERYDAY 1st-15th of every month), Disp: 90 capsule,  Rfl: 4 .  progesterone (PROMETRIUM) 200 MG capsule, TAKE 1 CAPSULE BY MOUTH EVERYDAY AT BEDTIME, Disp: 90 capsule, Rfl: 3 .  sertraline (ZOLOFT) 100 MG tablet, , Disp: , Rfl:  .  traMADol (ULTRAM) 50 MG tablet, Take 2 tablets (100 mg total) by mouth every 6 (six) hours as needed for moderate pain. TAKE 1/2 TABLET BY MOUTH EVERY 8 HOURS AS NEEDED, Disp: 120 tablet, Rfl: 2 .  traZODone (DESYREL) 50 MG tablet, TAKE 1-2 TABLET AT BEDTIME AS NEEDED FOR SLEEP, Disp: , Rfl: 2 .  valACYclovir (VALTREX) 500 MG tablet, Take 1 tablet (500 mg total) by mouth 2 (two) times daily., Disp: 180 tablet, Rfl: 0 .  doxycycline (VIBRA-TABS) 100 MG tablet, Take 1 tablet (100 mg total) by mouth 2 (two) times  daily., Disp: 20 tablet, Rfl: 0  EXAM:  VITALS per patient if applicable:  GENERAL: alert, oriented, appears well and in no acute distress  HEENT: atraumatic, conjunttiva clear, no obvious abnormalities on inspection of external nose and ears  NECK: normal movements of the head and neck  LUNGS: on inspection no signs of respiratory distress, breathing rate appears normal, no obvious gross SOB, gasping or wheezing  CV: no obvious cyanosis  MS: moves all visible extremities without noticeable abnormality  PSYCH/NEURO: pleasant and cooperative, no obvious depression or anxiety, speech and thought processing grossly intact  ASSESSMENT AND PLAN:  Discussed the following assessment and plan:  Sinus congestion  Facial discomfort  -we discussed possible serious and likely etiologies, options for evaluation and workup, limitations of telemedicine visit vs in person visit, treatment, treatment risks and precautions. Pt prefers to treat via telemedicine empirically rather then risking or undertaking an in person visit at this moment. Query VURI, Sinusitis vs other. Opted for treatment with doxy 111m bid x 7-10 days, nasal saline, short course of nasal decongestant. Discussed COVID19 testing and found some openings at [facility] test site that she plans to sign up for tomorrow morning. Advised staying home while sick and for a full 10 days if covid positive. Discussed treatment, potential complications, isolation and precautions if COVID test positive. Advised to seek prompt follow up telemedicine visit or in person care if worsening, +covid test, new symptoms arise, or if is not improving with treatment.  I discussed the assessment and treatment plan with the patient. The patient was provided an opportunity to ask questions and all were answered. The patient agreed with the plan and demonstrated an understanding of the instructions.   The patient was advised to call back or seek an in-person  evaluation if the symptoms worsen or if the condition fails to improve as anticipated.   HLucretia Kern DO

## 2020-02-21 ENCOUNTER — Ambulatory Visit: Payer: BC Managed Care – PPO | Admitting: Psychology

## 2020-02-28 ENCOUNTER — Other Ambulatory Visit: Payer: BC Managed Care – PPO

## 2020-02-28 ENCOUNTER — Other Ambulatory Visit: Payer: Self-pay

## 2020-02-28 ENCOUNTER — Telehealth: Payer: Self-pay | Admitting: Family Medicine

## 2020-02-28 ENCOUNTER — Encounter (HOSPITAL_COMMUNITY): Payer: Self-pay | Admitting: Emergency Medicine

## 2020-02-28 ENCOUNTER — Ambulatory Visit (INDEPENDENT_AMBULATORY_CARE_PROVIDER_SITE_OTHER): Payer: BC Managed Care – PPO | Admitting: Psychology

## 2020-02-28 ENCOUNTER — Emergency Department (HOSPITAL_COMMUNITY)
Admission: EM | Admit: 2020-02-28 | Discharge: 2020-02-28 | Disposition: A | Discharge status: Home / Self Care | Payer: BC Managed Care – PPO | Attending: Emergency Medicine | Admitting: Emergency Medicine

## 2020-02-28 DIAGNOSIS — Z5321 Procedure and treatment not carried out due to patient leaving prior to being seen by health care provider: Secondary | ICD-10-CM | POA: Diagnosis not present

## 2020-02-28 DIAGNOSIS — R0981 Nasal congestion: Secondary | ICD-10-CM | POA: Insufficient documentation

## 2020-02-28 DIAGNOSIS — J0100 Acute maxillary sinusitis, unspecified: Secondary | ICD-10-CM | POA: Diagnosis not present

## 2020-02-28 DIAGNOSIS — F312 Bipolar disorder, current episode manic severe with psychotic features: Secondary | ICD-10-CM

## 2020-02-28 DIAGNOSIS — R52 Pain, unspecified: Secondary | ICD-10-CM | POA: Diagnosis not present

## 2020-02-28 DIAGNOSIS — R0602 Shortness of breath: Secondary | ICD-10-CM | POA: Insufficient documentation

## 2020-02-28 DIAGNOSIS — Z20822 Contact with and (suspected) exposure to covid-19: Secondary | ICD-10-CM | POA: Diagnosis not present

## 2020-02-28 NOTE — Telephone Encounter (Signed)
Tried contacting the patient by phone twice and the mail box is full. I contacted the patient through MyChart for her to contact the office.  This patient needs to be sent to nurse triage if she contacts the office back.

## 2020-02-28 NOTE — Telephone Encounter (Signed)
Patient went to Urgent care and I have canceled the appointment that she scheduled for Friday.

## 2020-02-28 NOTE — ED Notes (Signed)
Pt notified staff that she was leaving

## 2020-02-28 NOTE — ED Triage Notes (Signed)
Patient with shortness of breath and nasal congestion.  She states that she is blocked up in her ears and nose.  She states that she is unable to breath.  She is able to speak in full sentences.  No cough.  She has been tested for covid and was negative.  Patient continues with congestion.

## 2020-02-28 NOTE — Telephone Encounter (Signed)
Patient was seen at urgent care.

## 2020-03-01 ENCOUNTER — Ambulatory Visit: Payer: BC Managed Care – PPO | Admitting: Family Medicine

## 2020-03-14 ENCOUNTER — Ambulatory Visit: Payer: BC Managed Care – PPO | Admitting: Psychology

## 2020-03-14 DIAGNOSIS — J343 Hypertrophy of nasal turbinates: Secondary | ICD-10-CM | POA: Diagnosis not present

## 2020-03-14 DIAGNOSIS — G4733 Obstructive sleep apnea (adult) (pediatric): Secondary | ICD-10-CM | POA: Diagnosis not present

## 2020-03-14 NOTE — Progress Notes (Signed)
Otolaryngology Clinic Note    HPI:      Gail Jensen is a 59 y.o. female who presents as a consultation at the request of Dr. Clent Ridges with a chief complaint of sleep apnea.  She was diagnosed with sleep apnea in 2017.  She tried CPAP at that time and was not able to tolerate it after about six months.  More recently, she had an updated home sleep study surrounding workup of memory problems that demonstrated an AHI of 20.    PMH/Meds/All/SocHx/FamHx/ROS:     Past Medical History:   Diagnosis Date   . Acid reflux    . Allergy    . Hypotension    . Menopause    . Mood swings        Past Surgical History:   Procedure Laterality Date   . BREAST SURGERY     . CESAREAN SECTION         No family history of bleeding disorders, wound healing problems or difficulty with anesthesia.     Social History     Socioeconomic History   . Marital status: Married     Spouse name: Not on file   . Number of children: Not on file   . Years of education: Not on file   . Highest education level: Not on file   Occupational History   . Not on file   Tobacco Use   . Smoking status: Never Smoker   . Smokeless tobacco: Never Used   Vaping Use   . Vaping Use: Never used   Substance and Sexual Activity   . Alcohol use: Yes   . Drug use: Yes     Types: Marijuana   . Sexual activity: Not on file   Other Topics Concern   . Not on file   Social History Narrative   . Not on file     Social Determinants of Health     Financial Resource Strain:    . Difficulty of Paying Living Expenses: Not on file   Food Insecurity:    . Worried About Programme researcher, broadcasting/film/video in the Last Year: Not on file   . Ran Out of Food in the Last Year: Not on file   Transportation Needs:    . Lack of Transportation (Medical): Not on file   . Lack of Transportation (Non-Medical): Not on file   Physical Activity:    . Days of Exercise per Week: Not on file   . Minutes of Exercise per Session: Not on file   Stress:    . Feeling of Stress : Not on file   Social Connections:    .  Frequency of Communication with Friends and Family: Not on file   . Frequency of Social Gatherings with Friends and Family: Not on file   . Attends Religious Services: Not on file   . Active Member of Clubs or Organizations: Not on file   . Attends Banker Meetings: Not on file   . Marital Status: Not on file         Current Outpatient Medications:   .  bimatoprost (LATISSE) 0.03 % ophthalmic solution, daily., Disp: , Rfl:   .  carBAMazepine (CARBATROL) 200 MG 12 hr capsule, TAKE 1 CAPSULE (200 MG TOTAL) BY MOUTH 2 TIMES DAILY FOR 5 DAYS., Disp: , Rfl:   .  clobetasoL (TEMOVATE) 0.05 % external solution, daily., Disp: , Rfl:   .  dextroamphetamine-amphetamine ER (ADDERALL XR/ER) 20 MG  24 hr capsule, as needed., Disp: , Rfl:   .  diazePAM (VALIUM) 5 MG tablet, as needed., Disp: , Rfl:   .  doxycycline hyclate 100 MG tablet, TAKE 1 TABLET BY MOUTH TWICE A DAY, Disp: , Rfl:   .  estradiol (VIVELLE-DOT) 0.075 mg/24 hr, Place 1 patch onto the skin., Disp: , Rfl:   .  ibuprofen (ADVIL,MOTRIN) 800 MG tablet, TAKE 1 TABLET BY MOUTH EVERY 8 HOURS AS NEEDED FOR MODERATE PAIN., Disp: , Rfl: 5  .  lamoTRIgine (LAMICTAL) 100 MG tablet, Take 200 mg by mouth as needed., Disp: , Rfl:   .  methylphenidate HCl 18 MG CR tablet, as needed., Disp: , Rfl:   .  predniSONE (DELTASONE) 10 MG tablet, 6 pills day 1, 5 pills day 2, 4 pills day 3, 3 pills day 4, 2 pills day 5, 1 pill day 6 take in am with food., Disp: 21 tablet, Rfl: 0  .  progesterone (PROMETRIUM) 200 MG capsule, Take 1 tablet daily days 1-15 of each month, Disp: , Rfl:   .  sertraline (ZOLOFT) 100 MG tablet, daily., Disp: , Rfl:   .  traMADoL (ULTRAM) 50 mg tablet, as needed., Disp: , Rfl:   .  traZODone (DESYREL) 50 MG tablet, TAKE 1-2 TABLET AT BEDTIME AS NEEDED FOR SLEEP, Disp: , Rfl: 2  .  valACYclovir (VALTREX) 500 MG tablet, Take 500 mg by mouth., Disp: , Rfl:     A complete ROS was performed with pertinent positives/negatives noted in the HPI. The  remainder of the ROS are negative.       Physical Exam:      BP 115/76   Pulse 76   Temp 97.6 F (36.4 C)   Ht 1.753 m (5\' 9" )   Wt 65.3 kg (144 lb)   LMP  (LMP Unknown)   BMI 21.27 kg/m     Appearance: alert, NAD, pleasant and cooperative  Ability to communicate: normal voice  Left ear: external ear normal, external auditory canal without substantial cerumen, tympanic membrane intact, middle ear aerated  Right ear: external ear normal, external auditory canal without substantial cerumen, tympanic membrane intact, middle ear aerated  Hearing: understands normal conversational speech  Nose: external nose normal, septum relatively midline, mild inferior turbinate hypertrophy; no lesions, masses, polyps or exudates   Lips, teeth, gums: no lesions, good dentition, healthy gums  Oropharynx: mucosa without ulcers, masses or leukoplakia, normal tongue, tonsils 1+; Friedman class 1  Neck: no mass or tenderness, normal neck landmarks  Thyroid: no thyromegaly or mass  Lymphatics: no cervical adenopathy  Salivary glands: soft, symmetric, no masses  Facial strength: normal and symmetric  CN II-XII intact    Independent Review of Additional Tests or Records:   None    Procedures:   None    Impression & Plans:   Gail Jensen is a 59 y.o. female with obstructive sleep apnea.    - We discussed her sleep apnea diagnosis, rationale for treatment, and treatment options.  She was not able to tolerate CPAP previously and is interested in alternative therapy.  We discussed hypoglossal nerve stimulator placement.  She will consider potentially another trial of CPAP or may call back to proceed with [facility] evaluation via drug-induced sleep endoscopy to evaluate the pattern of airway collapse.    Christia Reading, MD  Otolaryngology       Electronically signed by: Sammuel Hines, MD  03/14/20 [id number]

## 2020-03-20 ENCOUNTER — Ambulatory Visit (INDEPENDENT_AMBULATORY_CARE_PROVIDER_SITE_OTHER): Payer: BC Managed Care – PPO | Admitting: Psychology

## 2020-03-20 DIAGNOSIS — F312 Bipolar disorder, current episode manic severe with psychotic features: Secondary | ICD-10-CM | POA: Diagnosis not present

## 2020-03-20 DIAGNOSIS — F1410 Cocaine abuse, uncomplicated: Secondary | ICD-10-CM | POA: Diagnosis not present

## 2020-03-20 DIAGNOSIS — F3181 Bipolar II disorder: Secondary | ICD-10-CM | POA: Diagnosis not present

## 2020-03-25 ENCOUNTER — Encounter: Payer: Self-pay | Admitting: Neurology

## 2020-03-25 ENCOUNTER — Telehealth: Payer: Self-pay

## 2020-03-25 NOTE — Telephone Encounter (Signed)
Spoke with patient, AEX scheduled for 03/26/20 at 9am with Dr. Sabra Heck.  Patient is agreeable to date and time.  Encounter closed.

## 2020-03-25 NOTE — Progress Notes (Signed)
59 y.o. G75P2002 Legally Separated White or Caucasian female here for annual exam.  Moving to [place], [place].  Her condo sold in 24 hours.  Her job transfer isn't completely done but this is likely.  Mom passed in June.  She and one of her sisters were   Denies vaginal bleeding.  Having some increased urinary incontinence especially with coughing, laughing, sneezing.    Finally stopped bleeding  Patient's last menstrual period was 09/14/2014.          Sexually active: Yes.    The current method of family planning is post menopausal status.    Exercising: No.  exercise Smoker:  no  Health Maintenance: Pap:  03-27-15 neg HPV HR neg, 09-01-2018 neg HPV HR neg History of abnormal Pap: yes MMG:  11-13-2019 catgegory b density birads 1:neg Colonoscopy:  2015 f/u 50yr (not done).  Pt is going to try and have this done prior to moving. BMD:   2007 normal TDaP:  2015 Pneumonia vaccine(s):  done Shingrix:   2020 Hep C testing: neg 2018 Screening Labs: cmp and lipids 2019, TSH and Vit D done 08/2019   reports that she has quit smoking. She started smoking about 37 years ago. She has never used smokeless tobacco. She reports current alcohol use. She reports current drug use. Drug: Marijuana.  Past Medical History:  Diagnosis Date  . Abnormal Pap smear of cervix 2013   ASCUS  . Acne    sees Dr. DJarome Matinfor skin checks and acne  . Anemia   . Anxiety   . Bipolar 1 disorder ([place]   . Bipolar depression ([facility]    sees KPauline GoodNP and sees LTrey Paulafor  therapy  . Constipation   . GERD (gastroesophageal reflux disease)   . Herpes simplex   . IBS (irritable bowel syndrome)   . Insomnia   . Migraines   . Pneumonia 2019    Past Surgical History:  Procedure Laterality Date  . BASAL CELL CARCINOMA EXCISION     Dr Amy JMartinique . BREAST ENHANCEMENT SURGERY    . CESAREAN SECTION  1996  . COLONOSCOPY  2015   per Dr. MCollene Mares clear, repeat in 10 yrs   . COLPOSCOPY  10/07/11   CIN I  .  ESOPHAGOGASTRODUODENOSCOPY  09/07/2006   Dr JArdis Hughs   Current Outpatient Medications  Medication Sig Dispense Refill  . ALPRAZolam (XANAX) 0.25 MG tablet Take 0.25 mg by mouth daily as needed.     . bimatoprost (LATISSE) 0.03 % ophthalmic solution daily.    . cholecalciferol (VITAMIN D3) 25 MCG (1000 UT) tablet Take 1,000 Units by mouth daily.    . clobetasol (TEMOVATE) 0.05 % external solution APPLY TO AFFECTED AREA TWICE A DAY 50 mL 2  . diazepam (VALIUM) 5 MG tablet TAKE 1 TABLET TWICE A DAY FOR ANXIETY 180 tablet 1  . estradiol (VIVELLE-DOT) 0.075 MG/24HR Place 1 patch onto the skin 2 (two) times a week. 24 patch 4  . ibuprofen (ADVIL,MOTRIN) 800 MG tablet TAKE 1 TABLET (800 MG TOTAL) BY MOUTH EVERY 8 (EIGHT) HOURS AS NEEDED FOR MODERATE PAIN. 60 tablet 5  . lamoTRIgine (LAMICTAL) 100 MG tablet Take 150 mg by mouth daily.     . Multiple Vitamin (MULTI-VITAMIN PO) Take by mouth daily.    . progesterone (PROMETRIUM) 200 MG capsule TAKE 1 CAPSULE BY MOUTH EVERYDAY AT BEDTIME (Patient taking differently: TAKE 1 CAPSULE BY MOUTH EVERYDAY 1st-15th of every month) 90 capsule 4  .  sertraline (ZOLOFT) 100 MG tablet     . traMADol (ULTRAM) 50 MG tablet Take 2 tablets (100 mg total) by mouth every 6 (six) hours as needed for moderate pain. TAKE 1/2 TABLET BY MOUTH EVERY 8 HOURS AS NEEDED 120 tablet 2  . traZODone (DESYREL) 50 MG tablet TAKE 1-2 TABLET AT BEDTIME AS NEEDED FOR SLEEP  2  . valACYclovir (VALTREX) 500 MG tablet Take 1 tablet (500 mg total) by mouth 2 (two) times daily. 180 tablet 0   No current facility-administered medications for this visit.    Family History  Problem Relation Age of Onset  . Cancer Mother   . Bladder Cancer Mother   . Hypertension Mother   . Alcohol abuse Other   . Arthritis Other   . Cancer Other        breast, ovrarian, uterine    Review of Systems  Constitutional: Negative.   HENT: Negative.   Eyes: Negative.   Respiratory: Negative.    Cardiovascular: Negative.   Gastrointestinal: Negative.   Endocrine: Negative.   Genitourinary: Negative.   Musculoskeletal: Negative.   Skin: Negative.   Allergic/Immunologic: Negative.   Neurological: Negative.   Hematological: Negative.   Psychiatric/Behavioral: Negative.     Exam:   BP 110/70   Pulse 70   Resp 16   Ht 5' 8.25" (1.734 m)   Wt 145 lb (65.8 kg)   LMP 09/14/2014   BMI 21.89 kg/m   Height: 5' 8.25" (173.4 cm)  General appearance: alert, cooperative and appears stated age Head: Normocephalic, without obvious abnormality, atraumatic Neck: no adenopathy, supple, symmetrical, trachea midline and thyroid normal to inspection and palpation Lungs: clear to auscultation bilaterally Breasts: normal appearance, no masses or tenderness, 6 Heart: regular rate and rhythm Abdomen: soft, non-tender; bowel sounds normal; no masses,  no organomegaly Extremities: extremities normal, atraumatic, no cyanosis or edema Skin: Skin color, texture, turgor normal. No rashes or lesions Lymph nodes: Cervical, supraclavicular, and axillary nodes normal. No abnormal inguinal nodes palpated Neurologic: Grossly normal   Pelvic: External genitalia:  no lesions              Urethra:  normal appearing urethra with no masses, tenderness or lesions              Bartholins and Skenes: normal                 Vagina: normal appearing vagina with normal color and discharge, no lesions              Cervix: no lesions              Pap taken: No. Bimanual Exam:  Uterus:  normal size, contour, position, consistency, mobility, non-tender              Adnexa: normal adnexa and no mass, fullness, tenderness               Rectovaginal: Confirms               Anus:  normal sphincter tone, no lesions  Chaperone, Terence Lux, CMA, was present for exam.  A:  Well Woman with normal exam PMP, no HRT Bipolar d/o On HRT H/o HSV OSA working on getting her CPAP Fatigue Desires having estradiol  level today Hemorrhoid pain  P:   Mammogram guidelines reviewed.  Doing yearly as on HRT. pap smear neg with neg HR HPV RF for estradiol patches 0.061m, apply one twice weeky  #24/4RF.  Pt may  try stopping after move to [place]. Prometrium 221m days 1-15 each month.  #45/4RF Lidocaine 5% topically prn hemorrhoidal pain Colonoscopy 2015.  Aware this is due.  Is going to try and have [place] CBC, TSH and estradiol level obtained today return annually or prn

## 2020-03-25 NOTE — Telephone Encounter (Signed)
Appointment Request From: [name]    With Provider: Megan Salon, MD [facility]]    Preferred Date Range: 03/29/2020 - 04/05/2020    Preferred Times: Any Time    Reason for visit: Office Visit    Comments:  Relocating soon, blood work, hormone levels, stop patches and progesterone (?) thank you

## 2020-03-25 NOTE — Telephone Encounter (Signed)
Patient is returning call.  °

## 2020-03-25 NOTE — Telephone Encounter (Signed)
Left message to call Sharee Pimple, RN at [facility].    Last AEX 09/01/18

## 2020-03-26 ENCOUNTER — Encounter: Payer: Self-pay | Admitting: Obstetrics & Gynecology

## 2020-03-26 ENCOUNTER — Other Ambulatory Visit: Payer: Self-pay

## 2020-03-26 ENCOUNTER — Ambulatory Visit (INDEPENDENT_AMBULATORY_CARE_PROVIDER_SITE_OTHER): Payer: BC Managed Care – PPO | Admitting: Obstetrics & Gynecology

## 2020-03-26 VITALS — BP 110/70 | HR 70 | Resp 16 | Ht 68.25 in | Wt 145.0 lb

## 2020-03-26 DIAGNOSIS — Z01419 Encounter for gynecological examination (general) (routine) without abnormal findings: Secondary | ICD-10-CM

## 2020-03-26 DIAGNOSIS — E2839 Other primary ovarian failure: Secondary | ICD-10-CM | POA: Diagnosis not present

## 2020-03-26 DIAGNOSIS — R5383 Other fatigue: Secondary | ICD-10-CM | POA: Diagnosis not present

## 2020-03-26 MED ORDER — ESTRADIOL 0.025 MG/24HR TD PTTW: 1.0000 | MEDICATED_PATCH | TRANSDERMAL | 4 refills | Status: DC

## 2020-03-26 MED ORDER — LIDOCAINE 5 % EX OINT: TOPICAL_OINTMENT | CUTANEOUS | 0 refills | Status: DC

## 2020-03-26 MED ORDER — PROGESTERONE MICRONIZED 100 MG PO CAPS
100.0000 mg | ORAL_CAPSULE | Freq: Every day | ORAL | 4 refills | Status: DC
Start: 2020-03-26 — End: 2020-03-26

## 2020-03-26 MED ORDER — PROGESTERONE 200 MG PO CAPS: 200.0000 mg | ORAL_CAPSULE | Freq: Every day | ORAL | 4 refills | Status: DC

## 2020-03-27 LAB — CBC WITH DIFFERENTIAL/PLATELET
Basophils Absolute: 0.1 10*3/uL (ref 0.0–0.2)
Basos: 1 %
EOS (ABSOLUTE): 0.6 10*3/uL — ABNORMAL HIGH (ref 0.0–0.4)
Eos: 10 %
Hematocrit: 38.3 % (ref 34.0–46.6)
Hemoglobin: 12.6 g/dL (ref 11.1–15.9)
Immature Grans (Abs): 0 10*3/uL (ref 0.0–0.1)
Immature Granulocytes: 0 %
Lymphocytes Absolute: 1.7 10*3/uL (ref 0.7–3.1)
Lymphs: 27 %
MCH: 30.1 pg (ref 26.6–33.0)
MCHC: 32.9 g/dL (ref 31.5–35.7)
MCV: 91 fL (ref 79–97)
Monocytes Absolute: 0.5 10*3/uL (ref 0.1–0.9)
Monocytes: 7 %
Neutrophils Absolute: 3.3 10*3/uL (ref 1.4–7.0)
Neutrophils: 55 %
Platelets: 232 10*3/uL (ref 150–450)
RBC: 4.19 x10E6/uL (ref 3.77–5.28)
RDW: 12 % (ref 11.7–15.4)
WBC: 6.1 10*3/uL (ref 3.4–10.8)

## 2020-03-27 LAB — TSH: TSH: 1.38 u[IU]/mL (ref 0.450–4.500)

## 2020-03-27 LAB — ESTRADIOL: Estradiol: <5 pg/mL

## 2020-03-28 ENCOUNTER — Other Ambulatory Visit: Payer: Self-pay | Admitting: Obstetrics & Gynecology

## 2020-03-28 ENCOUNTER — Telehealth: Payer: Self-pay

## 2020-03-28 MED ORDER — LIDOCAINE 5 % EX OINT: TOPICAL_OINTMENT | CUTANEOUS | 0 refills | Status: AC

## 2020-03-28 NOTE — Telephone Encounter (Signed)
Can you please call pt and let her know that the cheapest place to use the good rx coupon for the lidocaine is [facility] and it is $15.  I can send a prescription to a [facility] if she's like me to do that.

## 2020-03-28 NOTE — Telephone Encounter (Signed)
Call to patient. Patient states that she is going to pharmacy this afternoon to pick up the lidocaine prescription. Patient states that it is going to cost $80. Asking for a cheaper alternative. RN advised patient could use GoodRX coupon and that should significantly reduce the cost for her. Patient states she has used [facility] in the past and will have the coupon for when she goes to the pharmacy. Patient appreciative of phone call.   Routing to provider and will close encounter.

## 2020-03-28 NOTE — Telephone Encounter (Signed)
Spoke with patient. Advised of message as seen below from [place]. Patient would like to use Good Rx coupon at [facility] stating it will be $18. Rx for Lidocaine 5% ointment sent to [facility] off [address] and [address] for patient to use coupon. Patient verbalizes understanding.  Routing to provider and will close encounter.

## 2020-03-28 NOTE — Telephone Encounter (Signed)
Patient would like to speak with nurse regarding prescription and the cost.

## 2020-03-28 NOTE — Addendum Note (Signed)
Addended by: Gwendlyn Deutscher on: 03/28/2020 05:45 PM   Modules accepted: Orders

## 2020-03-29 DIAGNOSIS — R059 Cough, unspecified: Secondary | ICD-10-CM | POA: Diagnosis not present

## 2020-03-29 DIAGNOSIS — Z20822 Contact with and (suspected) exposure to covid-19: Secondary | ICD-10-CM | POA: Diagnosis not present

## 2020-04-02 ENCOUNTER — Encounter: Payer: Self-pay | Admitting: Obstetrics & Gynecology

## 2020-04-02 ENCOUNTER — Telehealth: Payer: Self-pay

## 2020-04-02 NOTE — Telephone Encounter (Signed)
Pt sent following mychart message:   Test results  Received: Today [name] Hello Dr. Sabra Heck,   Thank you for seeing me so quick last week and running my blood work. It's all Spanish to me. Can I plz ask Joy or a staff member to go over it all with me sometime this week please?   Thank you  Joelene Millin

## 2020-04-02 NOTE — Telephone Encounter (Signed)
Left message for pt to return call to triage RN. 

## 2020-04-02 NOTE — Telephone Encounter (Signed)
Spoke with pt. Pt given results and recommendations per Dr Sabra Heck. Pt agreeable and verbalized understanding. Encounter closed    Jill Mcintyre, Your estradiol level is low (which I would expect now with the very low hormone dosing). I would not recommend increasing it as your are finally not bleeding. Your thyroid is normal. Your complete blood count is normal except for an absolute eosinophil blood value. This is not worrisome as your white cell count is normal. Please let me know if you have any questions. Thanks.  Jill Mcintyre  Written by Megan Salon, MD on 03/31/2020 10:37 PM EDT

## 2020-04-09 ENCOUNTER — Ambulatory Visit: Payer: BC Managed Care – PPO | Admitting: Psychology

## 2020-04-10 ENCOUNTER — Ambulatory Visit (INDEPENDENT_AMBULATORY_CARE_PROVIDER_SITE_OTHER): Payer: BC Managed Care – PPO | Admitting: Psychology

## 2020-04-10 ENCOUNTER — Ambulatory Visit: Payer: BC Managed Care – PPO | Admitting: Psychology

## 2020-04-10 DIAGNOSIS — F312 Bipolar disorder, current episode manic severe with psychotic features: Secondary | ICD-10-CM

## 2020-04-16 ENCOUNTER — Telehealth: Payer: Self-pay | Admitting: *Deleted

## 2020-04-16 DIAGNOSIS — R419 Unspecified symptoms and signs involving cognitive functions and awareness: Secondary | ICD-10-CM

## 2020-04-16 DIAGNOSIS — G4733 Obstructive sleep apnea (adult) (pediatric): Secondary | ICD-10-CM

## 2020-04-16 DIAGNOSIS — Z658 Other specified problems related to psychosocial circumstances: Secondary | ICD-10-CM

## 2020-04-16 DIAGNOSIS — K219 Gastro-esophageal reflux disease without esophagitis: Secondary | ICD-10-CM

## 2020-04-16 NOTE — Telephone Encounter (Signed)
She last got a machine on 07/28/16 from [facility] and still has it. She has a ResMed S10 Autoset currently set to 8cm w/ an EPR of 2. Pt last used the machine on 01/20/20 and has not had it plugged in since then.  Pt will not be eligible for a new machine until 07/28/2021. I can do a pressure change, but that's all.  Due to her recent sleep study showing moderate sleep apnea, would you like to keep her setting at 8cm or change to auto?  Can you please place the orders? Thank you.

## 2020-04-18 NOTE — Addendum Note (Signed)
Addended by: Larey Seat on: 04/18/2020 12:48 PM   Modules accepted: Orders

## 2020-04-18 NOTE — Telephone Encounter (Signed)
Order has been sent to [facility] for the patient for them to make the appropriate changes.

## 2020-04-18 NOTE — Telephone Encounter (Signed)
Will change to auto CPAP, settings from 5-12 cm water 2 cm EPR and mask of her choice and comfort. She can adjust the level of humidity.

## 2020-04-19 ENCOUNTER — Ambulatory Visit (INDEPENDENT_AMBULATORY_CARE_PROVIDER_SITE_OTHER): Payer: BC Managed Care – PPO | Admitting: Psychology

## 2020-04-19 DIAGNOSIS — F3132 Bipolar disorder, current episode depressed, moderate: Secondary | ICD-10-CM | POA: Diagnosis not present

## 2020-04-30 DIAGNOSIS — C44519 Basal cell carcinoma of skin of other part of trunk: Secondary | ICD-10-CM | POA: Diagnosis not present

## 2020-04-30 DIAGNOSIS — L821 Other seborrheic keratosis: Secondary | ICD-10-CM | POA: Diagnosis not present

## 2020-04-30 DIAGNOSIS — L820 Inflamed seborrheic keratosis: Secondary | ICD-10-CM | POA: Diagnosis not present

## 2020-04-30 DIAGNOSIS — Z85828 Personal history of other malignant neoplasm of skin: Secondary | ICD-10-CM | POA: Diagnosis not present

## 2020-04-30 DIAGNOSIS — C44619 Basal cell carcinoma of skin of left upper limb, including shoulder: Secondary | ICD-10-CM | POA: Diagnosis not present

## 2020-05-02 NOTE — Telephone Encounter (Signed)
Received a notification from [facility]) Advising that they had been trying multiple times to contact pt they finally was able to get a hold of her to discuss with her about the machine.  "Apparently her husband gave her CPAP to someone in [place], so she hasn't had the machine since 2019. And she never told us. I advised her that her ins will not covers new machine until 2023, but that she could buy one online and have [place] program it. She was very Patent attorney. Wanted to let you know!"

## 2020-05-06 DIAGNOSIS — M9903 Segmental and somatic dysfunction of lumbar region: Secondary | ICD-10-CM | POA: Diagnosis not present

## 2020-05-06 DIAGNOSIS — M5136 Other intervertebral disc degeneration, lumbar region: Secondary | ICD-10-CM | POA: Diagnosis not present

## 2020-05-07 ENCOUNTER — Encounter: Payer: Self-pay | Admitting: Family Medicine

## 2020-05-08 ENCOUNTER — Telehealth: Payer: Self-pay | Admitting: Neurology

## 2020-05-08 DIAGNOSIS — M9903 Segmental and somatic dysfunction of lumbar region: Secondary | ICD-10-CM | POA: Diagnosis not present

## 2020-05-08 DIAGNOSIS — M5136 Other intervertebral disc degeneration, lumbar region: Secondary | ICD-10-CM | POA: Diagnosis not present

## 2020-05-08 NOTE — Telephone Encounter (Signed)
Order has been faxed to the number provided and pt made aware

## 2020-05-08 NOTE — Telephone Encounter (Signed)
Pt. is asking for prescription for CPAP.  Pharmacy: [id number]

## 2020-05-08 NOTE — Telephone Encounter (Signed)
Rep. from [facility] called to request CPAP prescription be faxed to them.  Fax: [phone]       Attention: Tim Lair

## 2020-05-08 NOTE — Telephone Encounter (Signed)
Prescription has been sent to the pt for her to forward to [email or web address] This would not go to pharmacy.

## 2020-05-08 NOTE — Telephone Encounter (Signed)
Set up an OV for [place] to discuss all this

## 2020-05-13 ENCOUNTER — Telehealth: Payer: BC Managed Care – PPO | Admitting: Family Medicine

## 2020-05-13 ENCOUNTER — Encounter: Payer: Self-pay | Admitting: Family Medicine

## 2020-05-13 DIAGNOSIS — M5136 Other intervertebral disc degeneration, lumbar region: Secondary | ICD-10-CM | POA: Diagnosis not present

## 2020-05-13 DIAGNOSIS — M9903 Segmental and somatic dysfunction of lumbar region: Secondary | ICD-10-CM | POA: Diagnosis not present

## 2020-05-14 ENCOUNTER — Other Ambulatory Visit: Payer: Self-pay | Admitting: Family Medicine

## 2020-05-14 ENCOUNTER — Telehealth (INDEPENDENT_AMBULATORY_CARE_PROVIDER_SITE_OTHER): Payer: BC Managed Care – PPO | Admitting: Family Medicine

## 2020-05-14 ENCOUNTER — Encounter: Payer: Self-pay | Admitting: Family Medicine

## 2020-05-14 VITALS — Wt 145.6 lb

## 2020-05-14 DIAGNOSIS — M545 Low back pain, unspecified: Secondary | ICD-10-CM

## 2020-05-14 DIAGNOSIS — F900 Attention-deficit hyperactivity disorder, predominantly inattentive type: Secondary | ICD-10-CM | POA: Diagnosis not present

## 2020-05-14 MED ORDER — AMPHETAMINE-DEXTROAMPHETAMINE 30 MG PO TABS: 30.0000 mg | ORAL_TABLET | Freq: Two times a day (BID) | ORAL | 0 refills | Status: DC

## 2020-05-14 MED ORDER — CYCLOBENZAPRINE HCL 10 MG PO TABS: 10.0000 mg | ORAL_TABLET | Freq: Three times a day (TID) | ORAL | 2 refills | Status: AC | PRN

## 2020-05-14 MED ORDER — CLOBETASOL PROPIONATE 0.05 % EX SOLN
CUTANEOUS | 5 refills | Status: DC
Start: 2020-05-14 — End: 2020-12-31

## 2020-05-14 MED ORDER — TRAMADOL HCL 50 MG PO TABS
100.0000 mg | ORAL_TABLET | Freq: Four times a day (QID) | ORAL | 2 refills | Status: DC | PRN
Start: 2020-05-14 — End: 2020-07-23

## 2020-05-14 NOTE — Telephone Encounter (Signed)
Done

## 2020-05-14 NOTE — Telephone Encounter (Cosign Needed)
Please advise on the refill request by patient.  Thanks.  Dm/cma

## 2020-05-14 NOTE — Progress Notes (Signed)
Subjective:    Patient ID: Jill Mcintyre, female    DOB: 12-10-1960, 59 y.o.   MRN: [id number]  HPI Virtual Visit via Video Note  I connected with the patient on 05/14/20 at  9:45 AM EST by a video enabled telemedicine application and verified that I am speaking with the correct person using two identifiers.  Location patient: home Location provider:work or home office Persons participating in the virtual visit: patient, provider  I discussed the limitations of evaluation and management by telemedicine and the availability of in person appointments. The patient expressed understanding and agreed to proceed.   HPI: Here for several issues. First her ADHD has been acting up and she wants to try Adderall again. She plans to move back to her home in [place] in January, and she finds the preparations to be overwhelming. Also a few weeks ago she developed a lot of lower back pain, and she has been seeing Dr. Owens Shark at [place] for this. He recently took Xrays that showed her hips to be "out of alignment" and we reviewed these together today. These films show her to have lateral and rotational scoliosis of the lumbar spine. She asks fir help with the pain.   ROS: See pertinent positives and negatives per HPI.  Past Medical History:  Diagnosis Date  . Abnormal Pap smear of cervix 2013   ASCUS  . Acne    sees Dr. Jarome Matin for skin checks and acne  . Anemia   . Anxiety   . Bipolar 1 disorder ([place])   . Bipolar depression [facility])    sees Pauline Good NP and sees Trey Paula for  therapy  . Constipation   . GERD (gastroesophageal reflux disease)   . Herpes simplex   . IBS (irritable bowel syndrome)   . Insomnia   . Migraines   . Pneumonia 2019    Past Surgical History:  Procedure Laterality Date  . BASAL CELL CARCINOMA EXCISION     Dr Amy Martinique  . BREAST ENHANCEMENT SURGERY    . CESAREAN SECTION  1996  . COLONOSCOPY  2015   per Dr. Collene Mares,  clear, repeat in 10 yrs   . COLPOSCOPY  10/07/11   CIN I  . ESOPHAGOGASTRODUODENOSCOPY  09/07/2006   Dr Ardis Hughs    Family History  Problem Relation Age of Onset  . Cancer Mother   . Bladder Cancer Mother   . Hypertension Mother   . Alcohol abuse Other   . Arthritis Other   . Cancer Other        breast, ovrarian, uterine     Current Outpatient Medications:  .  ALPRAZolam (XANAX) 0.25 MG tablet, Take 0.25 mg by mouth daily as needed. , Disp: , Rfl:  .  bimatoprost (LATISSE) 0.03 % ophthalmic solution, daily., Disp: , Rfl:  .  cholecalciferol (VITAMIN D3) 25 MCG (1000 UT) tablet, Take 1,000 Units by mouth daily., Disp: , Rfl:  .  clobetasol (TEMOVATE) 0.05 % external solution, APPLY TO AFFECTED AREA TWICE A DAY, Disp: 50 mL, Rfl: 2 .  diazepam (VALIUM) 5 MG tablet, TAKE 1 TABLET TWICE A DAY FOR ANXIETY, Disp: 180 tablet, Rfl: 1 .  estradiol (VIVELLE-DOT) 0.025 MG/24HR, Place 1 patch onto the skin 2 (two) times a week., Disp: 24 patch, Rfl: 4 .  ibuprofen (ADVIL,MOTRIN) 800 MG tablet, TAKE 1 TABLET (800 MG TOTAL) BY MOUTH EVERY 8 (EIGHT) HOURS AS NEEDED FOR MODERATE PAIN., Disp: 60 tablet, Rfl: 5 .  lidocaine (XYLOCAINE) 5 % ointment, Apply small amount topically ever 6 hours as needed for hemorrhoid pain, Disp: 1.25 g, Rfl: 0 .  Multiple Vitamin (MULTI-VITAMIN PO), Take by mouth daily., Disp: , Rfl:  .  progesterone (PROMETRIUM) 200 MG capsule, Take 1 capsule (200 mg total) by mouth daily. Take days 1-15 each month., Disp: 45 capsule, Rfl: 4 .  sertraline (ZOLOFT) 100 MG tablet, , Disp: , Rfl:  .  traMADol (ULTRAM) 50 MG tablet, Take 2 tablets (100 mg total) by mouth every 6 (six) hours as needed for moderate pain. TAKE 1/2 TABLET BY MOUTH EVERY 8 HOURS AS NEEDED, Disp: 120 tablet, Rfl: 2 .  traZODone (DESYREL) 50 MG tablet, TAKE 1-2 TABLET AT BEDTIME AS NEEDED FOR SLEEP, Disp: , Rfl: 2 .  valACYclovir (VALTREX) 500 MG tablet, Take 1 tablet (500 mg total) by mouth 2 (two) times daily.,  Disp: 180 tablet, Rfl: 0 .  lamoTRIgine (LAMICTAL) 100 MG tablet, Take 150 mg by mouth daily. , Disp: , Rfl:  .  progesterone (PROMETRIUM) 200 MG capsule, TAKE 1 CAPSULE BY MOUTH EVERYDAY AT BEDTIME (Patient not taking: Reported on 05/14/2020), Disp: 90 capsule, Rfl: 4  EXAM:  VITALS per patient if applicable:  GENERAL: alert, oriented, appears well and in no acute distress  HEENT: atraumatic, conjunttiva clear, no obvious abnormalities on inspection of external nose and ears  NECK: normal movements of the head and neck  LUNGS: on inspection no signs of respiratory distress, breathing rate appears normal, no obvious gross SOB, gasping or wheezing  CV: no obvious cyanosis  MS: moves all visible extremities without noticeable abnormality  PSYCH/NEURO: pleasant and cooperative, no obvious depression or anxiety, speech and thought processing grossly intact  ASSESSMENT AND PLAN: For the ADHD, we will refill her Adderall 30 mg to take BID. For the low back pain, she can try Tramadol 100 mg as needed along with Flexeril TID.  Alysia Penna, MD  Discussed the following assessment and plan:  No diagnosis found.     I discussed the assessment and treatment plan with the patient. The patient was provided an opportunity to ask questions and all were answered. The patient agreed with the plan and demonstrated an understanding of the instructions.   The patient was advised to call back or seek an in-person evaluation if the symptoms worsen or if the condition fails to improve as anticipated.     Review of Systems     Objective:   Physical Exam        Assessment & Plan:

## 2020-05-15 DIAGNOSIS — M9903 Segmental and somatic dysfunction of lumbar region: Secondary | ICD-10-CM | POA: Diagnosis not present

## 2020-05-15 DIAGNOSIS — M5136 Other intervertebral disc degeneration, lumbar region: Secondary | ICD-10-CM | POA: Diagnosis not present

## 2020-05-20 DIAGNOSIS — H0232 Blepharochalasis right lower eyelid: Secondary | ICD-10-CM | POA: Diagnosis not present

## 2020-05-20 DIAGNOSIS — H0231 Blepharochalasis right upper eyelid: Secondary | ICD-10-CM | POA: Diagnosis not present

## 2020-05-20 DIAGNOSIS — H0234 Blepharochalasis left upper eyelid: Secondary | ICD-10-CM | POA: Diagnosis not present

## 2020-05-20 DIAGNOSIS — D210 Benign neoplasm of connective and other soft tissue of head, face and neck: Secondary | ICD-10-CM | POA: Diagnosis not present

## 2020-06-03 DIAGNOSIS — L905 Scar conditions and fibrosis of skin: Secondary | ICD-10-CM | POA: Diagnosis not present

## 2020-06-04 DIAGNOSIS — M9903 Segmental and somatic dysfunction of lumbar region: Secondary | ICD-10-CM | POA: Diagnosis not present

## 2020-06-04 DIAGNOSIS — M5136 Other intervertebral disc degeneration, lumbar region: Secondary | ICD-10-CM | POA: Diagnosis not present

## 2020-06-18 DIAGNOSIS — M5136 Other intervertebral disc degeneration, lumbar region: Secondary | ICD-10-CM | POA: Diagnosis not present

## 2020-06-18 DIAGNOSIS — M9903 Segmental and somatic dysfunction of lumbar region: Secondary | ICD-10-CM | POA: Diagnosis not present

## 2020-06-20 ENCOUNTER — Other Ambulatory Visit: Payer: Self-pay | Admitting: Family Medicine

## 2020-06-24 NOTE — Telephone Encounter (Signed)
Last refill-05-14-20 Last seen-06-19-20

## 2020-06-25 DIAGNOSIS — M9903 Segmental and somatic dysfunction of lumbar region: Secondary | ICD-10-CM | POA: Diagnosis not present

## 2020-06-25 DIAGNOSIS — M5136 Other intervertebral disc degeneration, lumbar region: Secondary | ICD-10-CM | POA: Diagnosis not present

## 2020-06-25 MED ORDER — AMPHETAMINE-DEXTROAMPHETAMINE 30 MG PO TABS: 30.0000 mg | ORAL_TABLET | Freq: Two times a day (BID) | ORAL | 0 refills | Status: DC

## 2020-06-25 NOTE — Telephone Encounter (Signed)
Done

## 2020-07-01 ENCOUNTER — Encounter: Payer: Self-pay | Admitting: Family Medicine

## 2020-07-01 DIAGNOSIS — M9903 Segmental and somatic dysfunction of lumbar region: Secondary | ICD-10-CM | POA: Diagnosis not present

## 2020-07-01 DIAGNOSIS — M5136 Other intervertebral disc degeneration, lumbar region: Secondary | ICD-10-CM | POA: Diagnosis not present

## 2020-07-01 MED ORDER — AMPHETAMINE-DEXTROAMPHETAMINE 30 MG PO TABS: 30.0000 mg | ORAL_TABLET | Freq: Two times a day (BID) | ORAL | 0 refills | Status: DC

## 2020-07-01 NOTE — Telephone Encounter (Signed)
Done for one month

## 2020-07-11 ENCOUNTER — Other Ambulatory Visit: Payer: Self-pay | Admitting: Family Medicine

## 2020-07-12 MED ORDER — VALACYCLOVIR HCL 500 MG PO TABS
500.0000 mg | ORAL_TABLET | Freq: Two times a day (BID) | ORAL | 0 refills | Status: DC
Start: 2020-07-12 — End: 2020-09-08

## 2020-07-18 ENCOUNTER — Ambulatory Visit: Payer: BC Managed Care – PPO | Admitting: Psychology

## 2020-07-18 ENCOUNTER — Ambulatory Visit (INDEPENDENT_AMBULATORY_CARE_PROVIDER_SITE_OTHER): Payer: BC Managed Care – PPO | Admitting: Psychology

## 2020-07-18 DIAGNOSIS — F312 Bipolar disorder, current episode manic severe with psychotic features: Secondary | ICD-10-CM

## 2020-07-22 ENCOUNTER — Encounter: Payer: Self-pay | Admitting: Family Medicine

## 2020-07-22 DIAGNOSIS — M5442 Lumbago with sciatica, left side: Secondary | ICD-10-CM | POA: Diagnosis not present

## 2020-07-22 DIAGNOSIS — M9903 Segmental and somatic dysfunction of lumbar region: Secondary | ICD-10-CM | POA: Diagnosis not present

## 2020-07-22 DIAGNOSIS — M25552 Pain in left hip: Secondary | ICD-10-CM | POA: Diagnosis not present

## 2020-07-22 DIAGNOSIS — M542 Cervicalgia: Secondary | ICD-10-CM | POA: Diagnosis not present

## 2020-07-23 DIAGNOSIS — M5442 Lumbago with sciatica, left side: Secondary | ICD-10-CM | POA: Diagnosis not present

## 2020-07-23 DIAGNOSIS — M9903 Segmental and somatic dysfunction of lumbar region: Secondary | ICD-10-CM | POA: Diagnosis not present

## 2020-07-23 DIAGNOSIS — M542 Cervicalgia: Secondary | ICD-10-CM | POA: Diagnosis not present

## 2020-07-23 DIAGNOSIS — M25552 Pain in left hip: Secondary | ICD-10-CM | POA: Diagnosis not present

## 2020-07-23 MED ORDER — TRAMADOL HCL 50 MG PO TABS: 100.0000 mg | ORAL_TABLET | Freq: Four times a day (QID) | ORAL | 0 refills | Status: DC | PRN

## 2020-07-23 NOTE — Telephone Encounter (Signed)
Done

## 2020-07-23 NOTE — Telephone Encounter (Signed)
She should stick to Ibuprofen and Tramadol (which she has). Yes it would be okay for her to see a chiropractor

## 2020-07-25 ENCOUNTER — Other Ambulatory Visit: Payer: Self-pay | Admitting: Neurology

## 2020-07-25 ENCOUNTER — Encounter: Payer: Self-pay | Admitting: Neurology

## 2020-07-25 DIAGNOSIS — M9903 Segmental and somatic dysfunction of lumbar region: Secondary | ICD-10-CM | POA: Diagnosis not present

## 2020-07-25 DIAGNOSIS — G4733 Obstructive sleep apnea (adult) (pediatric): Secondary | ICD-10-CM

## 2020-07-25 DIAGNOSIS — M25552 Pain in left hip: Secondary | ICD-10-CM | POA: Diagnosis not present

## 2020-07-25 DIAGNOSIS — M5442 Lumbago with sciatica, left side: Secondary | ICD-10-CM | POA: Diagnosis not present

## 2020-07-25 DIAGNOSIS — M542 Cervicalgia: Secondary | ICD-10-CM | POA: Diagnosis not present

## 2020-07-26 DIAGNOSIS — M542 Cervicalgia: Secondary | ICD-10-CM | POA: Diagnosis not present

## 2020-07-26 DIAGNOSIS — M5442 Lumbago with sciatica, left side: Secondary | ICD-10-CM | POA: Diagnosis not present

## 2020-07-26 DIAGNOSIS — M9903 Segmental and somatic dysfunction of lumbar region: Secondary | ICD-10-CM | POA: Diagnosis not present

## 2020-07-26 DIAGNOSIS — M25552 Pain in left hip: Secondary | ICD-10-CM | POA: Diagnosis not present

## 2020-07-29 DIAGNOSIS — M25552 Pain in left hip: Secondary | ICD-10-CM | POA: Diagnosis not present

## 2020-07-29 DIAGNOSIS — M542 Cervicalgia: Secondary | ICD-10-CM | POA: Diagnosis not present

## 2020-07-29 DIAGNOSIS — M5442 Lumbago with sciatica, left side: Secondary | ICD-10-CM | POA: Diagnosis not present

## 2020-07-29 DIAGNOSIS — M9903 Segmental and somatic dysfunction of lumbar region: Secondary | ICD-10-CM | POA: Diagnosis not present

## 2020-07-31 DIAGNOSIS — M542 Cervicalgia: Secondary | ICD-10-CM | POA: Diagnosis not present

## 2020-07-31 DIAGNOSIS — M25552 Pain in left hip: Secondary | ICD-10-CM | POA: Diagnosis not present

## 2020-07-31 DIAGNOSIS — M9903 Segmental and somatic dysfunction of lumbar region: Secondary | ICD-10-CM | POA: Diagnosis not present

## 2020-07-31 DIAGNOSIS — M5442 Lumbago with sciatica, left side: Secondary | ICD-10-CM | POA: Diagnosis not present

## 2020-08-05 ENCOUNTER — Telehealth: Payer: Self-pay | Admitting: Family Medicine

## 2020-08-05 DIAGNOSIS — M9903 Segmental and somatic dysfunction of lumbar region: Secondary | ICD-10-CM | POA: Diagnosis not present

## 2020-08-05 DIAGNOSIS — M5442 Lumbago with sciatica, left side: Secondary | ICD-10-CM | POA: Diagnosis not present

## 2020-08-05 DIAGNOSIS — M25552 Pain in left hip: Secondary | ICD-10-CM | POA: Diagnosis not present

## 2020-08-05 DIAGNOSIS — M542 Cervicalgia: Secondary | ICD-10-CM | POA: Diagnosis not present

## 2020-08-05 NOTE — Telephone Encounter (Signed)
Please clarify which way patient is to take the Tramdol.  Thanks.  Dm/cma

## 2020-08-05 NOTE — Telephone Encounter (Signed)
PT  pharmacy need  direction clarification on traMADol (ULTRAM) 50 MG tablet, please call [phone]  [id number] [address] [place], [place]  Phone:  [phone] Fax:  [phone]

## 2020-08-06 NOTE — Telephone Encounter (Signed)
Called [facility] , spoke to Geronimo and advise on providers recommendations. Dm/cma

## 2020-08-06 NOTE — Telephone Encounter (Signed)
This should read "take 2 tablets" and NOT "take 1/2 tablet"

## 2020-08-07 DIAGNOSIS — M25552 Pain in left hip: Secondary | ICD-10-CM | POA: Diagnosis not present

## 2020-08-07 DIAGNOSIS — M542 Cervicalgia: Secondary | ICD-10-CM | POA: Diagnosis not present

## 2020-08-07 DIAGNOSIS — M5442 Lumbago with sciatica, left side: Secondary | ICD-10-CM | POA: Diagnosis not present

## 2020-08-07 DIAGNOSIS — M9903 Segmental and somatic dysfunction of lumbar region: Secondary | ICD-10-CM | POA: Diagnosis not present

## 2020-08-09 DIAGNOSIS — M5442 Lumbago with sciatica, left side: Secondary | ICD-10-CM | POA: Diagnosis not present

## 2020-08-09 DIAGNOSIS — M9903 Segmental and somatic dysfunction of lumbar region: Secondary | ICD-10-CM | POA: Diagnosis not present

## 2020-08-09 DIAGNOSIS — M25552 Pain in left hip: Secondary | ICD-10-CM | POA: Diagnosis not present

## 2020-08-09 DIAGNOSIS — M542 Cervicalgia: Secondary | ICD-10-CM | POA: Diagnosis not present

## 2020-08-12 DIAGNOSIS — M542 Cervicalgia: Secondary | ICD-10-CM | POA: Diagnosis not present

## 2020-08-12 DIAGNOSIS — M9903 Segmental and somatic dysfunction of lumbar region: Secondary | ICD-10-CM | POA: Diagnosis not present

## 2020-08-12 DIAGNOSIS — M5442 Lumbago with sciatica, left side: Secondary | ICD-10-CM | POA: Diagnosis not present

## 2020-08-12 DIAGNOSIS — M25552 Pain in left hip: Secondary | ICD-10-CM | POA: Diagnosis not present

## 2020-08-13 DIAGNOSIS — M25552 Pain in left hip: Secondary | ICD-10-CM | POA: Diagnosis not present

## 2020-08-13 DIAGNOSIS — M9903 Segmental and somatic dysfunction of lumbar region: Secondary | ICD-10-CM | POA: Diagnosis not present

## 2020-08-13 DIAGNOSIS — M5442 Lumbago with sciatica, left side: Secondary | ICD-10-CM | POA: Diagnosis not present

## 2020-08-13 DIAGNOSIS — M542 Cervicalgia: Secondary | ICD-10-CM | POA: Diagnosis not present

## 2020-08-14 DIAGNOSIS — M9903 Segmental and somatic dysfunction of lumbar region: Secondary | ICD-10-CM | POA: Diagnosis not present

## 2020-08-14 DIAGNOSIS — M5442 Lumbago with sciatica, left side: Secondary | ICD-10-CM | POA: Diagnosis not present

## 2020-08-14 DIAGNOSIS — M542 Cervicalgia: Secondary | ICD-10-CM | POA: Diagnosis not present

## 2020-08-14 DIAGNOSIS — M25552 Pain in left hip: Secondary | ICD-10-CM | POA: Diagnosis not present

## 2020-08-15 DIAGNOSIS — M25552 Pain in left hip: Secondary | ICD-10-CM | POA: Diagnosis not present

## 2020-08-15 DIAGNOSIS — M9903 Segmental and somatic dysfunction of lumbar region: Secondary | ICD-10-CM | POA: Diagnosis not present

## 2020-08-15 DIAGNOSIS — M542 Cervicalgia: Secondary | ICD-10-CM | POA: Diagnosis not present

## 2020-08-15 DIAGNOSIS — M5442 Lumbago with sciatica, left side: Secondary | ICD-10-CM | POA: Diagnosis not present

## 2020-08-19 DIAGNOSIS — M542 Cervicalgia: Secondary | ICD-10-CM | POA: Diagnosis not present

## 2020-08-19 DIAGNOSIS — S338XXD Sprain of other parts of lumbar spine and pelvis, subsequent encounter: Secondary | ICD-10-CM | POA: Diagnosis not present

## 2020-08-19 DIAGNOSIS — M549 Dorsalgia, unspecified: Secondary | ICD-10-CM | POA: Diagnosis not present

## 2020-08-19 DIAGNOSIS — M9903 Segmental and somatic dysfunction of lumbar region: Secondary | ICD-10-CM | POA: Diagnosis not present

## 2020-08-19 DIAGNOSIS — M5442 Lumbago with sciatica, left side: Secondary | ICD-10-CM | POA: Diagnosis not present

## 2020-08-19 DIAGNOSIS — M25552 Pain in left hip: Secondary | ICD-10-CM | POA: Diagnosis not present

## 2020-08-21 DIAGNOSIS — M9903 Segmental and somatic dysfunction of lumbar region: Secondary | ICD-10-CM | POA: Diagnosis not present

## 2020-08-21 DIAGNOSIS — M542 Cervicalgia: Secondary | ICD-10-CM | POA: Diagnosis not present

## 2020-08-21 DIAGNOSIS — M25552 Pain in left hip: Secondary | ICD-10-CM | POA: Diagnosis not present

## 2020-08-21 DIAGNOSIS — M5442 Lumbago with sciatica, left side: Secondary | ICD-10-CM | POA: Diagnosis not present

## 2020-08-27 DIAGNOSIS — M5442 Lumbago with sciatica, left side: Secondary | ICD-10-CM | POA: Diagnosis not present

## 2020-08-27 DIAGNOSIS — M25552 Pain in left hip: Secondary | ICD-10-CM | POA: Diagnosis not present

## 2020-08-27 DIAGNOSIS — M542 Cervicalgia: Secondary | ICD-10-CM | POA: Diagnosis not present

## 2020-08-27 DIAGNOSIS — M9903 Segmental and somatic dysfunction of lumbar region: Secondary | ICD-10-CM | POA: Diagnosis not present

## 2020-08-28 DIAGNOSIS — S338XXD Sprain of other parts of lumbar spine and pelvis, subsequent encounter: Secondary | ICD-10-CM | POA: Diagnosis not present

## 2020-08-28 DIAGNOSIS — M25552 Pain in left hip: Secondary | ICD-10-CM | POA: Diagnosis not present

## 2020-08-28 DIAGNOSIS — M9903 Segmental and somatic dysfunction of lumbar region: Secondary | ICD-10-CM | POA: Diagnosis not present

## 2020-08-28 DIAGNOSIS — M549 Dorsalgia, unspecified: Secondary | ICD-10-CM | POA: Diagnosis not present

## 2020-08-28 DIAGNOSIS — M5442 Lumbago with sciatica, left side: Secondary | ICD-10-CM | POA: Diagnosis not present

## 2020-08-28 DIAGNOSIS — M542 Cervicalgia: Secondary | ICD-10-CM | POA: Diagnosis not present

## 2020-09-04 DIAGNOSIS — S338XXD Sprain of other parts of lumbar spine and pelvis, subsequent encounter: Secondary | ICD-10-CM | POA: Diagnosis not present

## 2020-09-04 DIAGNOSIS — M549 Dorsalgia, unspecified: Secondary | ICD-10-CM | POA: Diagnosis not present

## 2020-09-06 DIAGNOSIS — M5442 Lumbago with sciatica, left side: Secondary | ICD-10-CM | POA: Diagnosis not present

## 2020-09-06 DIAGNOSIS — M542 Cervicalgia: Secondary | ICD-10-CM | POA: Diagnosis not present

## 2020-09-06 DIAGNOSIS — M9903 Segmental and somatic dysfunction of lumbar region: Secondary | ICD-10-CM | POA: Diagnosis not present

## 2020-09-06 DIAGNOSIS — M25552 Pain in left hip: Secondary | ICD-10-CM | POA: Diagnosis not present

## 2020-09-08 ENCOUNTER — Other Ambulatory Visit: Payer: Self-pay | Admitting: Family Medicine

## 2020-09-08 ENCOUNTER — Encounter: Payer: Self-pay | Admitting: Family Medicine

## 2020-09-09 ENCOUNTER — Encounter: Payer: Self-pay | Admitting: Family Medicine

## 2020-09-09 MED ORDER — AMPHETAMINE-DEXTROAMPHETAMINE 30 MG PO TABS: 30.0000 mg | ORAL_TABLET | Freq: Two times a day (BID) | ORAL | 0 refills | Status: DC

## 2020-09-09 MED ORDER — VALACYCLOVIR HCL 500 MG PO TABS
500.0000 mg | ORAL_TABLET | Freq: Two times a day (BID) | ORAL | 3 refills | Status: DC
Start: 2020-09-09 — End: 2020-09-10

## 2020-09-09 MED ORDER — TRAMADOL HCL 50 MG PO TABS: 100.0000 mg | ORAL_TABLET | Freq: Four times a day (QID) | ORAL | 5 refills | Status: DC | PRN

## 2020-09-09 NOTE — Telephone Encounter (Signed)
Done

## 2020-09-10 ENCOUNTER — Other Ambulatory Visit: Payer: Self-pay

## 2020-09-10 DIAGNOSIS — M9903 Segmental and somatic dysfunction of lumbar region: Secondary | ICD-10-CM | POA: Diagnosis not present

## 2020-09-10 DIAGNOSIS — M25552 Pain in left hip: Secondary | ICD-10-CM | POA: Diagnosis not present

## 2020-09-10 DIAGNOSIS — B029 Zoster without complications: Secondary | ICD-10-CM

## 2020-09-10 DIAGNOSIS — J40 Bronchitis, not specified as acute or chronic: Secondary | ICD-10-CM

## 2020-09-10 DIAGNOSIS — M542 Cervicalgia: Secondary | ICD-10-CM | POA: Diagnosis not present

## 2020-09-10 DIAGNOSIS — M5442 Lumbago with sciatica, left side: Secondary | ICD-10-CM | POA: Diagnosis not present

## 2020-09-10 MED ORDER — VALACYCLOVIR HCL 500 MG PO TABS: 500.0000 mg | ORAL_TABLET | Freq: Two times a day (BID) | ORAL | 2 refills | Status: DC

## 2020-09-11 ENCOUNTER — Telehealth: Payer: Self-pay

## 2020-09-11 NOTE — Telephone Encounter (Signed)
Sent in a prior Authorization on pt Tramadol prescription, Rx approved until Sep 19 th 2023

## 2020-09-11 NOTE — Telephone Encounter (Signed)
Noted  

## 2020-09-11 NOTE — Telephone Encounter (Signed)
Left a message for pt to call her pharmacy

## 2020-09-12 ENCOUNTER — Telehealth: Payer: Self-pay | Admitting: Family Medicine

## 2020-09-12 NOTE — Telephone Encounter (Signed)
[facility] [phone]  She was calling to see if [name] has received the fax for Letter of Medical Necessity.

## 2020-09-13 ENCOUNTER — Telehealth: Payer: Self-pay | Admitting: *Deleted

## 2020-09-13 NOTE — Telephone Encounter (Signed)
No I haven't seen such a form

## 2020-09-13 NOTE — Telephone Encounter (Signed)
[id number] - PA started Outcome Additional Information Required Available without authorization.  Tramadol 50 mg

## 2020-09-13 NOTE — Telephone Encounter (Signed)
Spoke with pt pharmacy confirmed that Rx Tramadol was approved, state that pharmacy will notify pt when its ready

## 2020-09-13 NOTE — Telephone Encounter (Signed)
Spoke with pt advised that Dr Sarajane Jews has not received a medical necessity letter for pt, pt stated that she was going to contact her insurance company and have them fax another copy to our office

## 2020-09-14 NOTE — Telephone Encounter (Signed)
Pt letter was received and placed on Dr Sarajane Jews folder for completing

## 2020-09-16 NOTE — Telephone Encounter (Signed)
Pt form has been completed and faxed, pt notified to pick up copy in the office. A copy was sent to scanning

## 2020-09-17 ENCOUNTER — Telehealth: Payer: Self-pay

## 2020-09-17 DIAGNOSIS — S338XXD Sprain of other parts of lumbar spine and pelvis, subsequent encounter: Secondary | ICD-10-CM | POA: Diagnosis not present

## 2020-09-17 DIAGNOSIS — M549 Dorsalgia, unspecified: Secondary | ICD-10-CM | POA: Diagnosis not present

## 2020-09-17 NOTE — Telephone Encounter (Signed)
Pt Prior Auth for Tramadol was approved on 09/11/2020 Ref No: [id number]

## 2020-09-18 DIAGNOSIS — M5442 Lumbago with sciatica, left side: Secondary | ICD-10-CM | POA: Diagnosis not present

## 2020-09-18 DIAGNOSIS — M25552 Pain in left hip: Secondary | ICD-10-CM | POA: Diagnosis not present

## 2020-09-18 DIAGNOSIS — M9903 Segmental and somatic dysfunction of lumbar region: Secondary | ICD-10-CM | POA: Diagnosis not present

## 2020-09-18 DIAGNOSIS — M542 Cervicalgia: Secondary | ICD-10-CM | POA: Diagnosis not present

## 2020-09-20 DIAGNOSIS — M542 Cervicalgia: Secondary | ICD-10-CM | POA: Diagnosis not present

## 2020-09-20 DIAGNOSIS — M5442 Lumbago with sciatica, left side: Secondary | ICD-10-CM | POA: Diagnosis not present

## 2020-09-20 DIAGNOSIS — M9903 Segmental and somatic dysfunction of lumbar region: Secondary | ICD-10-CM | POA: Diagnosis not present

## 2020-09-20 DIAGNOSIS — M549 Dorsalgia, unspecified: Secondary | ICD-10-CM | POA: Diagnosis not present

## 2020-09-20 DIAGNOSIS — S338XXD Sprain of other parts of lumbar spine and pelvis, subsequent encounter: Secondary | ICD-10-CM | POA: Diagnosis not present

## 2020-09-20 DIAGNOSIS — M25552 Pain in left hip: Secondary | ICD-10-CM | POA: Diagnosis not present

## 2020-09-24 DIAGNOSIS — M5442 Lumbago with sciatica, left side: Secondary | ICD-10-CM | POA: Diagnosis not present

## 2020-09-24 DIAGNOSIS — M25552 Pain in left hip: Secondary | ICD-10-CM | POA: Diagnosis not present

## 2020-09-24 DIAGNOSIS — M542 Cervicalgia: Secondary | ICD-10-CM | POA: Diagnosis not present

## 2020-09-24 DIAGNOSIS — M9903 Segmental and somatic dysfunction of lumbar region: Secondary | ICD-10-CM | POA: Diagnosis not present

## 2020-09-25 DIAGNOSIS — M542 Cervicalgia: Secondary | ICD-10-CM | POA: Diagnosis not present

## 2020-09-25 DIAGNOSIS — M5442 Lumbago with sciatica, left side: Secondary | ICD-10-CM | POA: Diagnosis not present

## 2020-09-25 DIAGNOSIS — M25552 Pain in left hip: Secondary | ICD-10-CM | POA: Diagnosis not present

## 2020-09-25 DIAGNOSIS — M9903 Segmental and somatic dysfunction of lumbar region: Secondary | ICD-10-CM | POA: Diagnosis not present

## 2020-10-03 DIAGNOSIS — S338XXD Sprain of other parts of lumbar spine and pelvis, subsequent encounter: Secondary | ICD-10-CM | POA: Diagnosis not present

## 2020-10-03 DIAGNOSIS — M549 Dorsalgia, unspecified: Secondary | ICD-10-CM | POA: Diagnosis not present

## 2020-10-08 ENCOUNTER — Encounter: Payer: Self-pay | Admitting: Family Medicine

## 2020-10-08 DIAGNOSIS — F411 Generalized anxiety disorder: Secondary | ICD-10-CM | POA: Diagnosis not present

## 2020-10-08 DIAGNOSIS — F3181 Bipolar II disorder: Secondary | ICD-10-CM | POA: Diagnosis not present

## 2020-10-08 DIAGNOSIS — F1410 Cocaine abuse, uncomplicated: Secondary | ICD-10-CM | POA: Diagnosis not present

## 2020-10-10 ENCOUNTER — Encounter: Payer: Self-pay | Admitting: Family Medicine

## 2020-10-10 ENCOUNTER — Telehealth (INDEPENDENT_AMBULATORY_CARE_PROVIDER_SITE_OTHER): Payer: BC Managed Care – PPO | Admitting: Family Medicine

## 2020-10-10 DIAGNOSIS — F900 Attention-deficit hyperactivity disorder, predominantly inattentive type: Secondary | ICD-10-CM

## 2020-10-10 DIAGNOSIS — F1411 Cocaine abuse, in remission: Secondary | ICD-10-CM | POA: Diagnosis not present

## 2020-10-10 DIAGNOSIS — F3170 Bipolar disorder, currently in remission, most recent episode unspecified: Secondary | ICD-10-CM

## 2020-10-10 NOTE — Progress Notes (Signed)
Subjective:    Patient ID: Jill Mcintyre, female    DOB: 24-Jun-1960, 60 y.o.   MRN: [id number]  HPI Virtual Visit via Video Note  I connected with the patient on 10/10/20 at 10:30 AM EDT by a video enabled telemedicine application and verified that I am speaking with the correct person using two identifiers.  Location patient: home Location provider:work or home office Persons participating in the virtual visit: patient, provider  I discussed the limitations of evaluation and management by telemedicine and the availability of in person appointments. The patient expressed understanding and agreed to proceed.   HPI: Here to tell me about her battle with cocaine abuse. She developed this problem last summer, and she eventually check ed herself into a rehab program. She has been sober since 04-21-20, and she continues to work in therapy with Beckey Downing. She still sees Pauline Good NP for psychiatric care. After talking to them recently, they encouraged Maudie Mercury to tell me about this. She feels great now, and she says her relationship with her husband is the best it has ever been. She still uses Adderall occasionally but never daily.    ROS: See pertinent positives and negatives per HPI.  Past Medical History:  Diagnosis Date  . Abnormal Pap smear of cervix 2013   ASCUS  . Acne    sees Dr. Jarome Matin for skin checks and acne  . Anemia   . Anxiety   . Bipolar 1 disorder ([place])   . Bipolar depression [facility])    sees Pauline Good NP and sees Trey Paula for  therapy  . Constipation   . GERD (gastroesophageal reflux disease)   . Herpes simplex   . IBS (irritable bowel syndrome)   . Insomnia   . Migraines   . Pneumonia 2019    Past Surgical History:  Procedure Laterality Date  . BASAL CELL CARCINOMA EXCISION     Dr Amy Martinique  . BREAST ENHANCEMENT SURGERY    . CESAREAN SECTION  1996  . COLONOSCOPY  2015   per Dr. Collene Mares, clear, repeat in 10 yrs   . COLPOSCOPY  10/07/11   CIN I   . ESOPHAGOGASTRODUODENOSCOPY  09/07/2006   Dr Ardis Hughs    Family History  Problem Relation Age of Onset  . Cancer Mother   . Bladder Cancer Mother   . Hypertension Mother   . Alcohol abuse Other   . Arthritis Other   . Cancer Other        breast, ovrarian, uterine     Current Outpatient Medications:  .  ALPRAZolam (XANAX) 0.25 MG tablet, Take 0.25 mg by mouth daily as needed. , Disp: , Rfl:  .  [START ON 11/09/2020] amphetamine-dextroamphetamine (ADDERALL) 30 MG tablet, Take 1 tablet by mouth 2 (two) times daily., Disp: 60 tablet, Rfl: 0 .  bimatoprost (LATISSE) 0.03 % ophthalmic solution, daily., Disp: , Rfl:  .  cholecalciferol (VITAMIN D3) 25 MCG (1000 UT) tablet, Take 1,000 Units by mouth daily., Disp: , Rfl:  .  clobetasol (TEMOVATE) 0.05 % external solution, APPLY TO AFFECTED AREA TWICE A DAY, Disp: 50 mL, Rfl: 5 .  cyclobenzaprine (FLEXERIL) 10 MG tablet, Take 1 tablet (10 mg total) by mouth 3 (three) times daily as needed for muscle spasms., Disp: 90 tablet, Rfl: 2 .  diazepam (VALIUM) 5 MG tablet, TAKE 1 TABLET TWICE A DAY FOR ANXIETY, Disp: 180 tablet, Rfl: 1 .  estradiol (VIVELLE-DOT) 0.025 MG/24HR, Place 1 patch onto the skin  2 (two) times a week., Disp: 24 patch, Rfl: 4 .  ibuprofen (ADVIL,MOTRIN) 800 MG tablet, TAKE 1 TABLET (800 MG TOTAL) BY MOUTH EVERY 8 (EIGHT) HOURS AS NEEDED FOR MODERATE PAIN., Disp: 60 tablet, Rfl: 5 .  lamoTRIgine (LAMICTAL) 100 MG tablet, Take 150 mg by mouth daily. , Disp: , Rfl:  .  lidocaine (XYLOCAINE) 5 % ointment, Apply small amount topically ever 6 hours as needed for hemorrhoid pain, Disp: 1.25 g, Rfl: 0 .  Multiple Vitamin (MULTI-VITAMIN PO), Take by mouth daily., Disp: , Rfl:  .  progesterone (PROMETRIUM) 200 MG capsule, TAKE 1 CAPSULE BY MOUTH EVERYDAY AT BEDTIME (Patient not taking: Reported on 05/14/2020), Disp: 90 capsule, Rfl: 4 .  progesterone (PROMETRIUM) 200 MG capsule, Take 1 capsule (200 mg total) by mouth daily. Take days 1-15  each month., Disp: 45 capsule, Rfl: 4 .  sertraline (ZOLOFT) 100 MG tablet, , Disp: , Rfl:  .  traMADol (ULTRAM) 50 MG tablet, Take 2 tablets (100 mg total) by mouth every 6 (six) hours as needed for moderate pain. TAKE 1/2 TABLET BY MOUTH EVERY 8 HOURS AS NEEDED, Disp: 120 tablet, Rfl: 5 .  traZODone (DESYREL) 50 MG tablet, TAKE 1-2 TABLET AT BEDTIME AS NEEDED FOR SLEEP, Disp: , Rfl: 2 .  valACYclovir (VALTREX) 500 MG tablet, Take 1 tablet (500 mg total) by mouth 2 (two) times daily., Disp: 180 tablet, Rfl: 2  EXAM:  VITALS per patient if applicable:  GENERAL: alert, oriented, appears well and in no acute distress  HEENT: atraumatic, conjunttiva clear, no obvious abnormalities on inspection of external nose and ears  NECK: normal movements of the head and neck  LUNGS: on inspection no signs of respiratory distress, breathing rate appears normal, no obvious gross SOB, gasping or wheezing  CV: no obvious cyanosis  MS: moves all visible extremities without noticeable abnormality  PSYCH/NEURO: pleasant and cooperative, no obvious depression or anxiety, speech and thought processing grossly intact  ASSESSMENT AND PLAN: Her ADHD and bipolar depression are stable. She is also recovering from cocaine abuse, and she seems to be doing well.  Alysia Penna, MD  Discussed the following assessment and plan:  No diagnosis found.     I discussed the assessment and treatment plan with the patient. The patient was provided an opportunity to ask questions and all were answered. The patient agreed with the plan and demonstrated an understanding of the instructions.   The patient was advised to call back or seek an in-person evaluation if the symptoms worsen or if the condition fails to improve as anticipated.     Review of Systems     Objective:   Physical Exam        Assessment & Plan:

## 2020-10-11 DIAGNOSIS — M79672 Pain in left foot: Secondary | ICD-10-CM | POA: Diagnosis not present

## 2020-10-11 DIAGNOSIS — M79671 Pain in right foot: Secondary | ICD-10-CM | POA: Diagnosis not present

## 2020-10-11 DIAGNOSIS — M205X2 Other deformities of toe(s) (acquired), left foot: Secondary | ICD-10-CM | POA: Diagnosis not present

## 2020-10-11 DIAGNOSIS — M205X1 Other deformities of toe(s) (acquired), right foot: Secondary | ICD-10-CM | POA: Diagnosis not present

## 2020-10-17 DIAGNOSIS — M5442 Lumbago with sciatica, left side: Secondary | ICD-10-CM | POA: Diagnosis not present

## 2020-10-17 DIAGNOSIS — M542 Cervicalgia: Secondary | ICD-10-CM | POA: Diagnosis not present

## 2020-10-17 DIAGNOSIS — M25552 Pain in left hip: Secondary | ICD-10-CM | POA: Diagnosis not present

## 2020-10-17 DIAGNOSIS — M9903 Segmental and somatic dysfunction of lumbar region: Secondary | ICD-10-CM | POA: Diagnosis not present

## 2020-10-18 DIAGNOSIS — M25552 Pain in left hip: Secondary | ICD-10-CM | POA: Diagnosis not present

## 2020-10-18 DIAGNOSIS — M9903 Segmental and somatic dysfunction of lumbar region: Secondary | ICD-10-CM | POA: Diagnosis not present

## 2020-10-18 DIAGNOSIS — M542 Cervicalgia: Secondary | ICD-10-CM | POA: Diagnosis not present

## 2020-10-18 DIAGNOSIS — M5442 Lumbago with sciatica, left side: Secondary | ICD-10-CM | POA: Diagnosis not present

## 2020-10-22 ENCOUNTER — Ambulatory Visit (INDEPENDENT_AMBULATORY_CARE_PROVIDER_SITE_OTHER): Payer: BC Managed Care – PPO | Admitting: Psychology

## 2020-10-22 DIAGNOSIS — F312 Bipolar disorder, current episode manic severe with psychotic features: Secondary | ICD-10-CM | POA: Diagnosis not present

## 2020-10-22 DIAGNOSIS — M542 Cervicalgia: Secondary | ICD-10-CM | POA: Diagnosis not present

## 2020-10-22 DIAGNOSIS — M25552 Pain in left hip: Secondary | ICD-10-CM | POA: Diagnosis not present

## 2020-10-22 DIAGNOSIS — M9903 Segmental and somatic dysfunction of lumbar region: Secondary | ICD-10-CM | POA: Diagnosis not present

## 2020-10-22 DIAGNOSIS — M5442 Lumbago with sciatica, left side: Secondary | ICD-10-CM | POA: Diagnosis not present

## 2020-10-25 DIAGNOSIS — S338XXD Sprain of other parts of lumbar spine and pelvis, subsequent encounter: Secondary | ICD-10-CM | POA: Diagnosis not present

## 2020-10-25 DIAGNOSIS — M549 Dorsalgia, unspecified: Secondary | ICD-10-CM | POA: Diagnosis not present

## 2020-11-04 DIAGNOSIS — L905 Scar conditions and fibrosis of skin: Secondary | ICD-10-CM | POA: Diagnosis not present

## 2020-11-04 DIAGNOSIS — Z09 Encounter for follow-up examination after completed treatment for conditions other than malignant neoplasm: Secondary | ICD-10-CM | POA: Diagnosis not present

## 2020-11-14 ENCOUNTER — Other Ambulatory Visit (HOSPITAL_BASED_OUTPATIENT_CLINIC_OR_DEPARTMENT_OTHER): Payer: Self-pay | Admitting: Obstetrics & Gynecology

## 2020-11-14 MED ORDER — ESTRADIOL 0.025 MG/24HR TD PTTW: 1.0000 | MEDICATED_PATCH | TRANSDERMAL | 2 refills | Status: DC

## 2020-11-14 MED ORDER — PROGESTERONE 200 MG PO CAPS: 200.0000 mg | ORAL_CAPSULE | Freq: Every day | ORAL | 2 refills | Status: DC

## 2020-12-04 DIAGNOSIS — F1410 Cocaine abuse, uncomplicated: Secondary | ICD-10-CM | POA: Diagnosis not present

## 2020-12-04 DIAGNOSIS — F3181 Bipolar II disorder: Secondary | ICD-10-CM | POA: Diagnosis not present

## 2020-12-04 DIAGNOSIS — F411 Generalized anxiety disorder: Secondary | ICD-10-CM | POA: Diagnosis not present

## 2020-12-31 ENCOUNTER — Encounter: Payer: Self-pay | Admitting: Family Medicine

## 2020-12-31 ENCOUNTER — Other Ambulatory Visit: Payer: Self-pay | Admitting: Family Medicine

## 2020-12-31 MED ORDER — CLOBETASOL PROPIONATE 0.05 % EX SOLN: CUTANEOUS | 5 refills | Status: AC

## 2020-12-31 MED ORDER — DIAZEPAM 5 MG PO TABS: ORAL_TABLET | ORAL | 1 refills | Status: DC

## 2020-12-31 NOTE — Telephone Encounter (Signed)
Pt LVV was on 09/21/2020 Last refill was done on 11/09/20 for 60 tablets Please advise

## 2020-12-31 NOTE — Telephone Encounter (Signed)
Done

## 2021-01-01 ENCOUNTER — Telehealth: Payer: Self-pay | Admitting: Family Medicine

## 2021-01-01 IMAGING — NM NM HEPATO W/GB/PHARM/[PERSON_NAME]
2 series · 12 of 12 positions shown · non-contrast
Comparison: Ultrasound 07/22/2018

CLINICAL DATA: Right upper quadrant pain.

EXAM:
NUCLEAR MEDICINE HEPATOBILIARY IMAGING WITH GALLBLADDER EF
TECHNIQUE: Sequential images of the abdomen were obtained [DATE] minutes
following intravenous administration of radiopharmaceutical. After
oral ingestion of Ensure, gallbladder ejection fraction was
determined. At 60 min, normal ejection fraction is greater than 33%.
RADIOPHARMACEUTICALS:  5.28 mCi Kc-EEm  Choletec IV

[he hepatobiliary · 4.52mm/px · 6 of 60 frames shown (1 of 2)]
[frame 6/60]
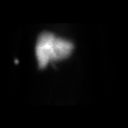
[frame 16/60]
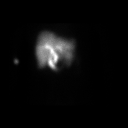
[frame 26/60]
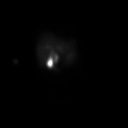
[frame 36/60]
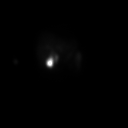
[frame 46/60]
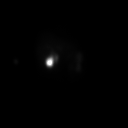
[frame 56/60]
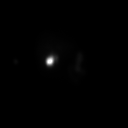

[he hepatobiliary · 4.52mm/px · 6 of 60 frames shown (2 of 2)]
[frame 6/60]
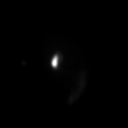
[frame 16/60]
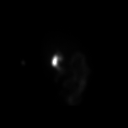
[frame 26/60]
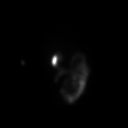
[frame 36/60]
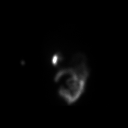
[frame 46/60]
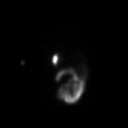
[frame 56/60]
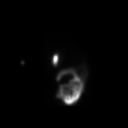

[12 of 12 positions shown; findings below may reference images not displayed]

FINDINGS: Prompt uptake and biliary excretion of activity by the liver is
seen. Gallbladder activity is visualized, consistent with patency of
cystic duct. Biliary activity passes into small bowel, consistent
with patent common bile duct.

Calculated gallbladder ejection fraction is 78%. (Normal gallbladder
ejection fraction with Ensure is greater than 33%.)
IMPRESSION: 1. Patent cystic duct without evidence for acute cholecystitis.
2. Normal gallbladder ejection fraction.

Right upper quadrant abdominal pain. Select proper NMHepatobiliary
template.

## 2021-01-01 MED ORDER — AMPHETAMINE-DEXTROAMPHETAMINE 30 MG PO TABS: 30.0000 mg | ORAL_TABLET | Freq: Two times a day (BID) | ORAL | 0 refills | Status: DC

## 2021-01-01 MED ORDER — AMPHETAMINE-DEXTROAMPHETAMINE 30 MG PO TABS
30.0000 mg | ORAL_TABLET | Freq: Two times a day (BID) | ORAL | 0 refills | Status: DC
Start: 2021-01-01 — End: 2021-01-01

## 2021-01-01 NOTE — Telephone Encounter (Signed)
Disregard

## 2021-01-01 NOTE — Telephone Encounter (Signed)
Done

## 2021-01-08 ENCOUNTER — Other Ambulatory Visit: Payer: Self-pay

## 2021-01-09 ENCOUNTER — Encounter: Payer: Self-pay | Admitting: Family Medicine

## 2021-01-09 ENCOUNTER — Ambulatory Visit (INDEPENDENT_AMBULATORY_CARE_PROVIDER_SITE_OTHER): Payer: BC Managed Care – PPO | Admitting: Family Medicine

## 2021-01-09 VITALS — BP 124/70 | HR 62 | Temp 97.9°F | Ht 68.25 in | Wt 144.2 lb

## 2021-01-09 DIAGNOSIS — R1084 Generalized abdominal pain: Secondary | ICD-10-CM

## 2021-01-09 DIAGNOSIS — Z0000 Encounter for general adult medical examination without abnormal findings: Secondary | ICD-10-CM | POA: Diagnosis not present

## 2021-01-09 LAB — IRON: Iron: 54 ug/dL (ref 42–145)

## 2021-01-09 LAB — CBC WITH DIFFERENTIAL/PLATELET
Basophils Absolute: 0 10*3/uL (ref 0.0–0.1)
Basophils Relative: 0.9 % (ref 0.0–3.0)
Eosinophils Absolute: 0.2 10*3/uL (ref 0.0–0.7)
Eosinophils Relative: 4.5 % (ref 0.0–5.0)
HCT: 38.7 % (ref 36.0–46.0)
Hemoglobin: 12.9 g/dL (ref 12.0–15.0)
Lymphocytes Relative: 38.1 % (ref 12.0–46.0)
Lymphs Abs: 1.9 10*3/uL (ref 0.7–4.0)
MCHC: 33.4 g/dL (ref 30.0–36.0)
MCV: 94.1 fl (ref 78.0–100.0)
Monocytes Absolute: 0.3 10*3/uL (ref 0.1–1.0)
Monocytes Relative: 6.6 % (ref 3.0–12.0)
Neutro Abs: 2.6 10*3/uL (ref 1.4–7.7)
Neutrophils Relative %: 49.9 % (ref 43.0–77.0)
Platelets: 230 10*3/uL (ref 150.0–400.0)
RBC: 4.11 Mil/uL (ref 3.87–5.11)
RDW: 12.9 % (ref 11.5–15.5)
WBC: 5.1 10*3/uL (ref 4.0–10.5)

## 2021-01-09 LAB — LIPID PANEL
Cholesterol: 205 mg/dL — ABNORMAL HIGH (ref 0–200)
HDL: 64.2 mg/dL (ref >39.00)
LDL Cholesterol: 124 mg/dL — ABNORMAL HIGH (ref 0–99)
NonHDL: 141.23
Total CHOL/HDL Ratio: 3
Triglycerides: 87 mg/dL (ref 0.0–149.0)
VLDL: 17.4 mg/dL (ref 0.0–40.0)

## 2021-01-09 LAB — HEPATIC FUNCTION PANEL
ALT: 15 U/L (ref 0–35)
AST: 19 U/L (ref 0–37)
Albumin: 4.4 g/dL (ref 3.5–5.2)
Alkaline Phosphatase: 55 U/L (ref 39–117)
Bilirubin, Direct: 0.1 mg/dL (ref 0.0–0.3)
Total Bilirubin: 0.4 mg/dL (ref 0.2–1.2)
Total Protein: 6.7 g/dL (ref 6.0–8.3)

## 2021-01-09 LAB — BASIC METABOLIC PANEL
BUN: 17 mg/dL (ref 6–23)
CO2: 33 mEq/L — ABNORMAL HIGH (ref 19–32)
Calcium: 9 mg/dL (ref 8.4–10.5)
Chloride: 99 mEq/L (ref 96–112)
Creatinine, Ser: 0.83 mg/dL (ref 0.40–1.20)
GFR: 77.86 mL/min (ref >60.00)
Glucose, Bld: 96 mg/dL (ref 70–99)
Potassium: 3.9 mEq/L (ref 3.5–5.1)
Sodium: 139 mEq/L (ref 135–145)

## 2021-01-09 LAB — T4, FREE: Free T4: 0.66 ng/dL (ref 0.60–1.60)

## 2021-01-09 LAB — T3, FREE: T3, Free: 3.4 pg/mL (ref 2.3–4.2)

## 2021-01-09 LAB — TSH: TSH: 1.49 u[IU]/mL (ref 0.35–5.50)

## 2021-01-09 LAB — HEMOGLOBIN A1C: Hgb A1c MFr Bld: 6 % (ref 4.6–6.5)

## 2021-01-09 NOTE — Progress Notes (Signed)
   Subjective:    Patient ID: Jill Mcintyre, female    DOB: 07/01/1960, 60 y.o.   MRN: [id number]  HPI Here for a well exam. She is doing well. She sees her GYN and her psychiatrist regularly. Her ADHD is well controlled.    Review of Systems  Constitutional: Negative.   HENT: Negative.    Eyes: Negative.   Respiratory: Negative.    Cardiovascular: Negative.   Gastrointestinal: Negative.   Genitourinary:  Negative for decreased urine volume, difficulty urinating, dyspareunia, dysuria, enuresis, flank pain, frequency, hematuria, pelvic pain and urgency.  Musculoskeletal: Negative.   Skin: Negative.   Neurological: Negative.  Negative for headaches.  Psychiatric/Behavioral: Negative.        Objective:   Physical Exam Constitutional:      General: She is not in acute distress.    Appearance: Normal appearance. She is well-developed.  HENT:     Head: Normocephalic and atraumatic.     Right Ear: External ear normal.     Left Ear: External ear normal.     Nose: Nose normal.     Mouth/Throat:     Pharynx: No oropharyngeal exudate.  Eyes:     General: No scleral icterus.    Conjunctiva/sclera: Conjunctivae normal.     Pupils: Pupils are equal, round, and reactive to light.  Neck:     Thyroid: No thyromegaly.     Vascular: No JVD.  Cardiovascular:     Rate and Rhythm: Normal rate and regular rhythm.     Heart sounds: Normal heart sounds. No murmur heard.   No friction rub. No gallop.  Pulmonary:     Effort: Pulmonary effort is normal. No respiratory distress.     Breath sounds: Normal breath sounds. No wheezing or rales.  Chest:     Chest wall: No tenderness.  Abdominal:     General: Bowel sounds are normal. There is no distension.     Palpations: Abdomen is soft. There is no mass.     Tenderness: There is no abdominal tenderness. There is no guarding or rebound.  Musculoskeletal:        General: No tenderness. Normal range of motion.     Cervical back: Normal  range of motion and neck supple.  Lymphadenopathy:     Cervical: No cervical adenopathy.  Skin:    General: Skin is warm and dry.     Findings: No erythema or rash.  Neurological:     Mental Status: She is alert and oriented to person, place, and time.     Cranial Nerves: No cranial nerve deficit.     Motor: No abnormal muscle tone.     Coordination: Coordination normal.     Deep Tendon Reflexes: Reflexes are normal and symmetric. Reflexes normal.  Psychiatric:        Behavior: Behavior normal.        Thought Content: Thought content normal.        Judgment: Judgment normal.          Assessment & Plan:  Well exam. We discussed diet and exercise. Get fasting labs.  Alysia Penna, MD

## 2021-01-10 ENCOUNTER — Telehealth: Payer: Self-pay | Admitting: Family Medicine

## 2021-01-10 NOTE — Telephone Encounter (Signed)
Pt returning your call

## 2021-01-10 NOTE — Telephone Encounter (Signed)
See results note. 

## 2021-01-12 ENCOUNTER — Encounter: Payer: Self-pay | Admitting: Family Medicine

## 2021-01-13 ENCOUNTER — Telehealth: Payer: BC Managed Care – PPO | Admitting: Family Medicine

## 2021-01-13 ENCOUNTER — Encounter: Payer: Self-pay | Admitting: Family Medicine

## 2021-01-16 LAB — CELIAC PANEL 10
Antigliadin Abs, IgA: 14 units (ref 0–19)
Endomysial IgA: NEGATIVE
Gliadin IgG: 28 units — ABNORMAL HIGH (ref 0–19)
IgA/Immunoglobulin A, Serum: 188 mg/dL (ref 87–352)
Tissue Transglut Ab: <2 U/mL (ref 0–5)
Transglutaminase IgA: <2 U/mL (ref 0–3)

## 2021-01-17 ENCOUNTER — Encounter: Payer: Self-pay | Admitting: Family Medicine

## 2021-01-17 NOTE — Telephone Encounter (Signed)
For these tests to show celiac disease, at least 3 out of the 6 would need to be abnormal

## 2021-01-20 ENCOUNTER — Encounter: Payer: Self-pay | Admitting: Family Medicine

## 2021-01-20 NOTE — Telephone Encounter (Signed)
I am not sure what that [phone] number refers to. If she is really concerned, I can refer her to GI to discuss this

## 2021-01-20 NOTE — Telephone Encounter (Signed)
This is most likely due to either a pinched nerve in the back or a pinched nerve in the wrist (carpal tunnel syndrome). To treat the latter possibility, tell her to wear an OTC wrist brace in bed every night to keep the wrist straight in her sleep

## 2021-01-21 NOTE — Telephone Encounter (Signed)
Yes she should follow up if not better in a couple weeks

## 2021-01-31 ENCOUNTER — Encounter: Payer: Self-pay | Admitting: Family Medicine

## 2021-02-10 NOTE — Telephone Encounter (Signed)
First of all, I had never heard of this so I had to look it up. This has never been scientifically investigated and is not approved for use. Possible side effects include liver and kidney toxicity, stoke and cancer. I would suggest she NOT take this

## 2021-02-11 DIAGNOSIS — D485 Neoplasm of uncertain behavior of skin: Secondary | ICD-10-CM | POA: Diagnosis not present

## 2021-02-11 DIAGNOSIS — B079 Viral wart, unspecified: Secondary | ICD-10-CM | POA: Diagnosis not present

## 2021-02-11 DIAGNOSIS — C44519 Basal cell carcinoma of skin of other part of trunk: Secondary | ICD-10-CM | POA: Diagnosis not present

## 2021-02-11 DIAGNOSIS — L578 Other skin changes due to chronic exposure to nonionizing radiation: Secondary | ICD-10-CM | POA: Diagnosis not present

## 2021-02-11 DIAGNOSIS — D229 Melanocytic nevi, unspecified: Secondary | ICD-10-CM | POA: Diagnosis not present

## 2021-02-11 DIAGNOSIS — L570 Actinic keratosis: Secondary | ICD-10-CM | POA: Diagnosis not present

## 2021-02-11 DIAGNOSIS — L309 Dermatitis, unspecified: Secondary | ICD-10-CM | POA: Diagnosis not present

## 2021-02-11 DIAGNOSIS — Z1283 Encounter for screening for malignant neoplasm of skin: Secondary | ICD-10-CM | POA: Diagnosis not present

## 2021-02-11 DIAGNOSIS — C44719 Basal cell carcinoma of skin of left lower limb, including hip: Secondary | ICD-10-CM | POA: Diagnosis not present

## 2021-02-11 DIAGNOSIS — L820 Inflamed seborrheic keratosis: Secondary | ICD-10-CM | POA: Diagnosis not present

## 2021-02-11 DIAGNOSIS — L821 Other seborrheic keratosis: Secondary | ICD-10-CM | POA: Diagnosis not present

## 2021-02-19 NOTE — Telephone Encounter (Signed)
I think that would be fine if she wants to

## 2021-02-21 ENCOUNTER — Encounter: Payer: Self-pay | Admitting: Family Medicine

## 2021-02-21 ENCOUNTER — Other Ambulatory Visit: Payer: Self-pay

## 2021-02-21 MED ORDER — DEXLANSOPRAZOLE 60 MG PO CPDR: 60.0000 mg | DELAYED_RELEASE_CAPSULE | Freq: Every day | ORAL | 0 refills | Status: DC

## 2021-02-21 NOTE — Telephone Encounter (Signed)
Call in [place] 60 mg daily, #30 with no rf

## 2021-02-25 DIAGNOSIS — K5904 Chronic idiopathic constipation: Secondary | ICD-10-CM | POA: Diagnosis not present

## 2021-02-25 DIAGNOSIS — K219 Gastro-esophageal reflux disease without esophagitis: Secondary | ICD-10-CM | POA: Diagnosis not present

## 2021-02-25 DIAGNOSIS — K900 Celiac disease: Secondary | ICD-10-CM | POA: Diagnosis not present

## 2021-02-25 DIAGNOSIS — R1013 Epigastric pain: Secondary | ICD-10-CM | POA: Diagnosis not present

## 2021-02-28 ENCOUNTER — Encounter: Payer: Self-pay | Admitting: Family Medicine

## 2021-03-03 ENCOUNTER — Other Ambulatory Visit: Payer: Self-pay

## 2021-03-03 ENCOUNTER — Telehealth: Payer: Self-pay

## 2021-03-03 MED ORDER — TRAZODONE HCL 50 MG PO TABS: ORAL_TABLET | ORAL | 3 refills | Status: AC

## 2021-03-03 MED ORDER — DEXLANSOPRAZOLE 60 MG PO CPDR: 60.0000 mg | DELAYED_RELEASE_CAPSULE | Freq: Every day | ORAL | 1 refills | Status: DC

## 2021-03-03 NOTE — Telephone Encounter (Signed)
Pt Rx for DEXLANSOPRAZOLE 60 mg was approved by insurance through 02/27/2022. Pt has been notified

## 2021-03-03 NOTE — Telephone Encounter (Signed)
I sent in the refill

## 2021-03-05 DIAGNOSIS — F411 Generalized anxiety disorder: Secondary | ICD-10-CM | POA: Diagnosis not present

## 2021-03-05 DIAGNOSIS — F3181 Bipolar II disorder: Secondary | ICD-10-CM | POA: Diagnosis not present

## 2021-03-05 DIAGNOSIS — F1410 Cocaine abuse, uncomplicated: Secondary | ICD-10-CM | POA: Diagnosis not present

## 2021-03-15 ENCOUNTER — Other Ambulatory Visit: Payer: Self-pay | Admitting: Family Medicine

## 2021-03-27 DIAGNOSIS — F3181 Bipolar II disorder: Secondary | ICD-10-CM | POA: Diagnosis not present

## 2021-03-27 DIAGNOSIS — F1410 Cocaine abuse, uncomplicated: Secondary | ICD-10-CM | POA: Diagnosis not present

## 2021-03-27 DIAGNOSIS — F411 Generalized anxiety disorder: Secondary | ICD-10-CM | POA: Diagnosis not present

## 2021-03-30 ENCOUNTER — Encounter: Payer: Self-pay | Admitting: Family Medicine

## 2021-03-30 ENCOUNTER — Other Ambulatory Visit: Payer: Self-pay | Admitting: Family Medicine

## 2021-03-31 ENCOUNTER — Encounter: Payer: Self-pay | Admitting: Family Medicine

## 2021-03-31 ENCOUNTER — Other Ambulatory Visit: Payer: Self-pay

## 2021-03-31 ENCOUNTER — Other Ambulatory Visit: Payer: Self-pay | Admitting: Family Medicine

## 2021-03-31 DIAGNOSIS — B029 Zoster without complications: Secondary | ICD-10-CM

## 2021-03-31 MED ORDER — VALACYCLOVIR HCL 500 MG PO TABS: 500.0000 mg | ORAL_TABLET | Freq: Two times a day (BID) | ORAL | 0 refills | Status: DC

## 2021-03-31 NOTE — Telephone Encounter (Signed)
Last refill- 03/04/21--60 tabs, 0 refills Last office visit physical- 01/09/21  No future office visit scheduled.   Can this patient receive a refill?

## 2021-04-01 MED ORDER — AMPHETAMINE-DEXTROAMPHETAMINE 30 MG PO TABS: 30.0000 mg | ORAL_TABLET | Freq: Two times a day (BID) | ORAL | 0 refills | Status: DC

## 2021-04-01 NOTE — Telephone Encounter (Signed)
Done

## 2021-04-01 NOTE — Telephone Encounter (Signed)
For insurance purposes it would be better for me to do a referral. Tell her to find out who she wants to see and then let us know

## 2021-04-03 DIAGNOSIS — R059 Cough, unspecified: Secondary | ICD-10-CM | POA: Diagnosis not present

## 2021-04-03 DIAGNOSIS — Z20822 Contact with and (suspected) exposure to covid-19: Secondary | ICD-10-CM | POA: Diagnosis not present

## 2021-04-03 DIAGNOSIS — J069 Acute upper respiratory infection, unspecified: Secondary | ICD-10-CM | POA: Diagnosis not present

## 2021-04-08 DIAGNOSIS — F1520 Other stimulant dependence, uncomplicated: Secondary | ICD-10-CM | POA: Diagnosis not present

## 2021-04-08 DIAGNOSIS — F329 Major depressive disorder, single episode, unspecified: Secondary | ICD-10-CM | POA: Diagnosis not present

## 2021-04-08 DIAGNOSIS — F1220 Cannabis dependence, uncomplicated: Secondary | ICD-10-CM | POA: Diagnosis not present

## 2021-04-08 DIAGNOSIS — K2100 Gastro-esophageal reflux disease with esophagitis, without bleeding: Secondary | ICD-10-CM | POA: Diagnosis not present

## 2021-04-08 DIAGNOSIS — F419 Anxiety disorder, unspecified: Secondary | ICD-10-CM | POA: Diagnosis not present

## 2021-04-08 DIAGNOSIS — F319 Bipolar disorder, unspecified: Secondary | ICD-10-CM | POA: Diagnosis not present

## 2021-04-08 DIAGNOSIS — F1420 Cocaine dependence, uncomplicated: Secondary | ICD-10-CM | POA: Diagnosis not present

## 2021-04-08 DIAGNOSIS — F1020 Alcohol dependence, uncomplicated: Secondary | ICD-10-CM | POA: Diagnosis not present

## 2021-04-11 ENCOUNTER — Other Ambulatory Visit: Payer: Self-pay | Admitting: Family Medicine

## 2021-04-12 DIAGNOSIS — F329 Major depressive disorder, single episode, unspecified: Secondary | ICD-10-CM | POA: Diagnosis not present

## 2021-04-12 DIAGNOSIS — F319 Bipolar disorder, unspecified: Secondary | ICD-10-CM | POA: Diagnosis not present

## 2021-04-12 DIAGNOSIS — F1420 Cocaine dependence, uncomplicated: Secondary | ICD-10-CM | POA: Diagnosis not present

## 2021-04-12 DIAGNOSIS — F1020 Alcohol dependence, uncomplicated: Secondary | ICD-10-CM | POA: Diagnosis not present

## 2021-04-12 DIAGNOSIS — K2100 Gastro-esophageal reflux disease with esophagitis, without bleeding: Secondary | ICD-10-CM | POA: Diagnosis not present

## 2021-04-12 DIAGNOSIS — F419 Anxiety disorder, unspecified: Secondary | ICD-10-CM | POA: Diagnosis not present

## 2021-04-12 DIAGNOSIS — F1220 Cannabis dependence, uncomplicated: Secondary | ICD-10-CM | POA: Diagnosis not present

## 2021-04-12 DIAGNOSIS — F1520 Other stimulant dependence, uncomplicated: Secondary | ICD-10-CM | POA: Diagnosis not present

## 2021-04-13 DIAGNOSIS — F1520 Other stimulant dependence, uncomplicated: Secondary | ICD-10-CM | POA: Diagnosis not present

## 2021-04-13 DIAGNOSIS — F419 Anxiety disorder, unspecified: Secondary | ICD-10-CM | POA: Diagnosis not present

## 2021-04-13 DIAGNOSIS — F1420 Cocaine dependence, uncomplicated: Secondary | ICD-10-CM | POA: Diagnosis not present

## 2021-04-13 DIAGNOSIS — F319 Bipolar disorder, unspecified: Secondary | ICD-10-CM | POA: Diagnosis not present

## 2021-04-13 DIAGNOSIS — F329 Major depressive disorder, single episode, unspecified: Secondary | ICD-10-CM | POA: Diagnosis not present

## 2021-04-13 DIAGNOSIS — F1220 Cannabis dependence, uncomplicated: Secondary | ICD-10-CM | POA: Diagnosis not present

## 2021-04-13 DIAGNOSIS — F1020 Alcohol dependence, uncomplicated: Secondary | ICD-10-CM | POA: Diagnosis not present

## 2021-04-13 DIAGNOSIS — K2100 Gastro-esophageal reflux disease with esophagitis, without bleeding: Secondary | ICD-10-CM | POA: Diagnosis not present

## 2021-04-14 DIAGNOSIS — K2100 Gastro-esophageal reflux disease with esophagitis, without bleeding: Secondary | ICD-10-CM | POA: Diagnosis not present

## 2021-04-14 DIAGNOSIS — F1420 Cocaine dependence, uncomplicated: Secondary | ICD-10-CM | POA: Diagnosis not present

## 2021-04-14 DIAGNOSIS — F1220 Cannabis dependence, uncomplicated: Secondary | ICD-10-CM | POA: Diagnosis not present

## 2021-04-14 DIAGNOSIS — F1020 Alcohol dependence, uncomplicated: Secondary | ICD-10-CM | POA: Diagnosis not present

## 2021-04-14 DIAGNOSIS — F329 Major depressive disorder, single episode, unspecified: Secondary | ICD-10-CM | POA: Diagnosis not present

## 2021-04-14 DIAGNOSIS — F1520 Other stimulant dependence, uncomplicated: Secondary | ICD-10-CM | POA: Diagnosis not present

## 2021-04-14 DIAGNOSIS — F319 Bipolar disorder, unspecified: Secondary | ICD-10-CM | POA: Diagnosis not present

## 2021-04-14 DIAGNOSIS — F419 Anxiety disorder, unspecified: Secondary | ICD-10-CM | POA: Diagnosis not present

## 2021-04-15 DIAGNOSIS — F1520 Other stimulant dependence, uncomplicated: Secondary | ICD-10-CM | POA: Diagnosis not present

## 2021-04-15 DIAGNOSIS — F1420 Cocaine dependence, uncomplicated: Secondary | ICD-10-CM | POA: Diagnosis not present

## 2021-04-15 DIAGNOSIS — F1220 Cannabis dependence, uncomplicated: Secondary | ICD-10-CM | POA: Diagnosis not present

## 2021-04-15 DIAGNOSIS — F319 Bipolar disorder, unspecified: Secondary | ICD-10-CM | POA: Diagnosis not present

## 2021-04-15 DIAGNOSIS — K2100 Gastro-esophageal reflux disease with esophagitis, without bleeding: Secondary | ICD-10-CM | POA: Diagnosis not present

## 2021-04-15 DIAGNOSIS — F419 Anxiety disorder, unspecified: Secondary | ICD-10-CM | POA: Diagnosis not present

## 2021-04-15 DIAGNOSIS — F1020 Alcohol dependence, uncomplicated: Secondary | ICD-10-CM | POA: Diagnosis not present

## 2021-04-15 DIAGNOSIS — F329 Major depressive disorder, single episode, unspecified: Secondary | ICD-10-CM | POA: Diagnosis not present

## 2021-04-16 DIAGNOSIS — F329 Major depressive disorder, single episode, unspecified: Secondary | ICD-10-CM | POA: Diagnosis not present

## 2021-04-16 DIAGNOSIS — F319 Bipolar disorder, unspecified: Secondary | ICD-10-CM | POA: Diagnosis not present

## 2021-04-16 DIAGNOSIS — F1220 Cannabis dependence, uncomplicated: Secondary | ICD-10-CM | POA: Diagnosis not present

## 2021-04-16 DIAGNOSIS — F419 Anxiety disorder, unspecified: Secondary | ICD-10-CM | POA: Diagnosis not present

## 2021-04-16 DIAGNOSIS — K2100 Gastro-esophageal reflux disease with esophagitis, without bleeding: Secondary | ICD-10-CM | POA: Diagnosis not present

## 2021-04-16 DIAGNOSIS — F1020 Alcohol dependence, uncomplicated: Secondary | ICD-10-CM | POA: Diagnosis not present

## 2021-04-16 DIAGNOSIS — F1420 Cocaine dependence, uncomplicated: Secondary | ICD-10-CM | POA: Diagnosis not present

## 2021-04-16 DIAGNOSIS — F1520 Other stimulant dependence, uncomplicated: Secondary | ICD-10-CM | POA: Diagnosis not present

## 2021-04-17 DIAGNOSIS — F1420 Cocaine dependence, uncomplicated: Secondary | ICD-10-CM | POA: Diagnosis not present

## 2021-04-17 DIAGNOSIS — F319 Bipolar disorder, unspecified: Secondary | ICD-10-CM | POA: Diagnosis not present

## 2021-04-17 DIAGNOSIS — F1520 Other stimulant dependence, uncomplicated: Secondary | ICD-10-CM | POA: Diagnosis not present

## 2021-04-17 DIAGNOSIS — F329 Major depressive disorder, single episode, unspecified: Secondary | ICD-10-CM | POA: Diagnosis not present

## 2021-04-17 DIAGNOSIS — F419 Anxiety disorder, unspecified: Secondary | ICD-10-CM | POA: Diagnosis not present

## 2021-04-17 DIAGNOSIS — K2100 Gastro-esophageal reflux disease with esophagitis, without bleeding: Secondary | ICD-10-CM | POA: Diagnosis not present

## 2021-04-17 DIAGNOSIS — F1220 Cannabis dependence, uncomplicated: Secondary | ICD-10-CM | POA: Diagnosis not present

## 2021-04-17 DIAGNOSIS — F1020 Alcohol dependence, uncomplicated: Secondary | ICD-10-CM | POA: Diagnosis not present

## 2021-04-18 DIAGNOSIS — F329 Major depressive disorder, single episode, unspecified: Secondary | ICD-10-CM | POA: Diagnosis not present

## 2021-04-18 DIAGNOSIS — F1220 Cannabis dependence, uncomplicated: Secondary | ICD-10-CM | POA: Diagnosis not present

## 2021-04-18 DIAGNOSIS — F1420 Cocaine dependence, uncomplicated: Secondary | ICD-10-CM | POA: Diagnosis not present

## 2021-04-18 DIAGNOSIS — F1020 Alcohol dependence, uncomplicated: Secondary | ICD-10-CM | POA: Diagnosis not present

## 2021-04-18 DIAGNOSIS — F1520 Other stimulant dependence, uncomplicated: Secondary | ICD-10-CM | POA: Diagnosis not present

## 2021-04-18 DIAGNOSIS — F419 Anxiety disorder, unspecified: Secondary | ICD-10-CM | POA: Diagnosis not present

## 2021-04-18 DIAGNOSIS — K2100 Gastro-esophageal reflux disease with esophagitis, without bleeding: Secondary | ICD-10-CM | POA: Diagnosis not present

## 2021-04-18 DIAGNOSIS — F319 Bipolar disorder, unspecified: Secondary | ICD-10-CM | POA: Diagnosis not present

## 2021-04-19 DIAGNOSIS — F329 Major depressive disorder, single episode, unspecified: Secondary | ICD-10-CM | POA: Diagnosis not present

## 2021-04-19 DIAGNOSIS — K2100 Gastro-esophageal reflux disease with esophagitis, without bleeding: Secondary | ICD-10-CM | POA: Diagnosis not present

## 2021-04-19 DIAGNOSIS — F1520 Other stimulant dependence, uncomplicated: Secondary | ICD-10-CM | POA: Diagnosis not present

## 2021-04-19 DIAGNOSIS — F319 Bipolar disorder, unspecified: Secondary | ICD-10-CM | POA: Diagnosis not present

## 2021-04-19 DIAGNOSIS — F419 Anxiety disorder, unspecified: Secondary | ICD-10-CM | POA: Diagnosis not present

## 2021-04-19 DIAGNOSIS — F1420 Cocaine dependence, uncomplicated: Secondary | ICD-10-CM | POA: Diagnosis not present

## 2021-04-19 DIAGNOSIS — F1020 Alcohol dependence, uncomplicated: Secondary | ICD-10-CM | POA: Diagnosis not present

## 2021-04-19 DIAGNOSIS — F1220 Cannabis dependence, uncomplicated: Secondary | ICD-10-CM | POA: Diagnosis not present

## 2021-04-20 DIAGNOSIS — F319 Bipolar disorder, unspecified: Secondary | ICD-10-CM | POA: Diagnosis not present

## 2021-04-20 DIAGNOSIS — F419 Anxiety disorder, unspecified: Secondary | ICD-10-CM | POA: Diagnosis not present

## 2021-04-20 DIAGNOSIS — F1220 Cannabis dependence, uncomplicated: Secondary | ICD-10-CM | POA: Diagnosis not present

## 2021-04-20 DIAGNOSIS — F329 Major depressive disorder, single episode, unspecified: Secondary | ICD-10-CM | POA: Diagnosis not present

## 2021-04-20 DIAGNOSIS — K2100 Gastro-esophageal reflux disease with esophagitis, without bleeding: Secondary | ICD-10-CM | POA: Diagnosis not present

## 2021-04-20 DIAGNOSIS — F1520 Other stimulant dependence, uncomplicated: Secondary | ICD-10-CM | POA: Diagnosis not present

## 2021-04-20 DIAGNOSIS — F1420 Cocaine dependence, uncomplicated: Secondary | ICD-10-CM | POA: Diagnosis not present

## 2021-04-20 DIAGNOSIS — F1020 Alcohol dependence, uncomplicated: Secondary | ICD-10-CM | POA: Diagnosis not present

## 2021-04-21 DIAGNOSIS — F319 Bipolar disorder, unspecified: Secondary | ICD-10-CM | POA: Diagnosis not present

## 2021-04-21 DIAGNOSIS — F1020 Alcohol dependence, uncomplicated: Secondary | ICD-10-CM | POA: Diagnosis not present

## 2021-04-21 DIAGNOSIS — F1420 Cocaine dependence, uncomplicated: Secondary | ICD-10-CM | POA: Diagnosis not present

## 2021-04-21 DIAGNOSIS — K2100 Gastro-esophageal reflux disease with esophagitis, without bleeding: Secondary | ICD-10-CM | POA: Diagnosis not present

## 2021-04-21 DIAGNOSIS — F1520 Other stimulant dependence, uncomplicated: Secondary | ICD-10-CM | POA: Diagnosis not present

## 2021-04-21 DIAGNOSIS — F329 Major depressive disorder, single episode, unspecified: Secondary | ICD-10-CM | POA: Diagnosis not present

## 2021-04-21 DIAGNOSIS — F1220 Cannabis dependence, uncomplicated: Secondary | ICD-10-CM | POA: Diagnosis not present

## 2021-04-21 DIAGNOSIS — F419 Anxiety disorder, unspecified: Secondary | ICD-10-CM | POA: Diagnosis not present

## 2021-04-22 DIAGNOSIS — F1020 Alcohol dependence, uncomplicated: Secondary | ICD-10-CM | POA: Diagnosis not present

## 2021-04-22 DIAGNOSIS — F1220 Cannabis dependence, uncomplicated: Secondary | ICD-10-CM | POA: Diagnosis not present

## 2021-04-22 DIAGNOSIS — K2100 Gastro-esophageal reflux disease with esophagitis, without bleeding: Secondary | ICD-10-CM | POA: Diagnosis not present

## 2021-04-22 DIAGNOSIS — F419 Anxiety disorder, unspecified: Secondary | ICD-10-CM | POA: Diagnosis not present

## 2021-04-22 DIAGNOSIS — F1420 Cocaine dependence, uncomplicated: Secondary | ICD-10-CM | POA: Diagnosis not present

## 2021-04-22 DIAGNOSIS — F319 Bipolar disorder, unspecified: Secondary | ICD-10-CM | POA: Diagnosis not present

## 2021-04-22 DIAGNOSIS — F329 Major depressive disorder, single episode, unspecified: Secondary | ICD-10-CM | POA: Diagnosis not present

## 2021-04-22 DIAGNOSIS — F1520 Other stimulant dependence, uncomplicated: Secondary | ICD-10-CM | POA: Diagnosis not present

## 2021-04-23 DIAGNOSIS — F319 Bipolar disorder, unspecified: Secondary | ICD-10-CM | POA: Diagnosis not present

## 2021-04-23 DIAGNOSIS — F419 Anxiety disorder, unspecified: Secondary | ICD-10-CM | POA: Diagnosis not present

## 2021-04-23 DIAGNOSIS — F1220 Cannabis dependence, uncomplicated: Secondary | ICD-10-CM | POA: Diagnosis not present

## 2021-04-23 DIAGNOSIS — F1020 Alcohol dependence, uncomplicated: Secondary | ICD-10-CM | POA: Diagnosis not present

## 2021-04-23 DIAGNOSIS — F1520 Other stimulant dependence, uncomplicated: Secondary | ICD-10-CM | POA: Diagnosis not present

## 2021-04-23 DIAGNOSIS — F1420 Cocaine dependence, uncomplicated: Secondary | ICD-10-CM | POA: Diagnosis not present

## 2021-04-23 DIAGNOSIS — K2100 Gastro-esophageal reflux disease with esophagitis, without bleeding: Secondary | ICD-10-CM | POA: Diagnosis not present

## 2021-04-23 DIAGNOSIS — F329 Major depressive disorder, single episode, unspecified: Secondary | ICD-10-CM | POA: Diagnosis not present

## 2021-04-24 DIAGNOSIS — F419 Anxiety disorder, unspecified: Secondary | ICD-10-CM | POA: Diagnosis not present

## 2021-04-24 DIAGNOSIS — F319 Bipolar disorder, unspecified: Secondary | ICD-10-CM | POA: Diagnosis not present

## 2021-04-24 DIAGNOSIS — F1420 Cocaine dependence, uncomplicated: Secondary | ICD-10-CM | POA: Diagnosis not present

## 2021-04-24 DIAGNOSIS — F329 Major depressive disorder, single episode, unspecified: Secondary | ICD-10-CM | POA: Diagnosis not present

## 2021-04-24 DIAGNOSIS — K2100 Gastro-esophageal reflux disease with esophagitis, without bleeding: Secondary | ICD-10-CM | POA: Diagnosis not present

## 2021-04-24 DIAGNOSIS — F1520 Other stimulant dependence, uncomplicated: Secondary | ICD-10-CM | POA: Diagnosis not present

## 2021-04-24 DIAGNOSIS — F1020 Alcohol dependence, uncomplicated: Secondary | ICD-10-CM | POA: Diagnosis not present

## 2021-04-24 DIAGNOSIS — F1220 Cannabis dependence, uncomplicated: Secondary | ICD-10-CM | POA: Diagnosis not present

## 2021-04-25 DIAGNOSIS — F319 Bipolar disorder, unspecified: Secondary | ICD-10-CM | POA: Diagnosis not present

## 2021-04-25 DIAGNOSIS — F1020 Alcohol dependence, uncomplicated: Secondary | ICD-10-CM | POA: Diagnosis not present

## 2021-04-25 DIAGNOSIS — F329 Major depressive disorder, single episode, unspecified: Secondary | ICD-10-CM | POA: Diagnosis not present

## 2021-04-25 DIAGNOSIS — K2100 Gastro-esophageal reflux disease with esophagitis, without bleeding: Secondary | ICD-10-CM | POA: Diagnosis not present

## 2021-04-25 DIAGNOSIS — F1220 Cannabis dependence, uncomplicated: Secondary | ICD-10-CM | POA: Diagnosis not present

## 2021-04-25 DIAGNOSIS — F1420 Cocaine dependence, uncomplicated: Secondary | ICD-10-CM | POA: Diagnosis not present

## 2021-04-25 DIAGNOSIS — F419 Anxiety disorder, unspecified: Secondary | ICD-10-CM | POA: Diagnosis not present

## 2021-04-25 DIAGNOSIS — F1520 Other stimulant dependence, uncomplicated: Secondary | ICD-10-CM | POA: Diagnosis not present

## 2021-04-26 DIAGNOSIS — F1520 Other stimulant dependence, uncomplicated: Secondary | ICD-10-CM | POA: Diagnosis not present

## 2021-04-26 DIAGNOSIS — F419 Anxiety disorder, unspecified: Secondary | ICD-10-CM | POA: Diagnosis not present

## 2021-04-26 DIAGNOSIS — K2100 Gastro-esophageal reflux disease with esophagitis, without bleeding: Secondary | ICD-10-CM | POA: Diagnosis not present

## 2021-04-26 DIAGNOSIS — F1220 Cannabis dependence, uncomplicated: Secondary | ICD-10-CM | POA: Diagnosis not present

## 2021-04-26 DIAGNOSIS — F1020 Alcohol dependence, uncomplicated: Secondary | ICD-10-CM | POA: Diagnosis not present

## 2021-04-26 DIAGNOSIS — F1420 Cocaine dependence, uncomplicated: Secondary | ICD-10-CM | POA: Diagnosis not present

## 2021-04-26 DIAGNOSIS — F319 Bipolar disorder, unspecified: Secondary | ICD-10-CM | POA: Diagnosis not present

## 2021-04-26 DIAGNOSIS — F329 Major depressive disorder, single episode, unspecified: Secondary | ICD-10-CM | POA: Diagnosis not present

## 2021-04-27 DIAGNOSIS — F319 Bipolar disorder, unspecified: Secondary | ICD-10-CM | POA: Diagnosis not present

## 2021-04-27 DIAGNOSIS — F1220 Cannabis dependence, uncomplicated: Secondary | ICD-10-CM | POA: Diagnosis not present

## 2021-04-27 DIAGNOSIS — F1420 Cocaine dependence, uncomplicated: Secondary | ICD-10-CM | POA: Diagnosis not present

## 2021-04-27 DIAGNOSIS — F419 Anxiety disorder, unspecified: Secondary | ICD-10-CM | POA: Diagnosis not present

## 2021-04-27 DIAGNOSIS — K2100 Gastro-esophageal reflux disease with esophagitis, without bleeding: Secondary | ICD-10-CM | POA: Diagnosis not present

## 2021-04-27 DIAGNOSIS — F1520 Other stimulant dependence, uncomplicated: Secondary | ICD-10-CM | POA: Diagnosis not present

## 2021-04-27 DIAGNOSIS — F329 Major depressive disorder, single episode, unspecified: Secondary | ICD-10-CM | POA: Diagnosis not present

## 2021-04-27 DIAGNOSIS — F1020 Alcohol dependence, uncomplicated: Secondary | ICD-10-CM | POA: Diagnosis not present

## 2021-04-28 ENCOUNTER — Other Ambulatory Visit: Payer: Self-pay | Admitting: Family Medicine

## 2021-04-28 DIAGNOSIS — F1020 Alcohol dependence, uncomplicated: Secondary | ICD-10-CM | POA: Diagnosis not present

## 2021-04-28 DIAGNOSIS — F329 Major depressive disorder, single episode, unspecified: Secondary | ICD-10-CM | POA: Diagnosis not present

## 2021-04-28 DIAGNOSIS — F419 Anxiety disorder, unspecified: Secondary | ICD-10-CM | POA: Diagnosis not present

## 2021-04-28 DIAGNOSIS — K2100 Gastro-esophageal reflux disease with esophagitis, without bleeding: Secondary | ICD-10-CM | POA: Diagnosis not present

## 2021-04-28 DIAGNOSIS — F1520 Other stimulant dependence, uncomplicated: Secondary | ICD-10-CM | POA: Diagnosis not present

## 2021-04-28 DIAGNOSIS — F319 Bipolar disorder, unspecified: Secondary | ICD-10-CM | POA: Diagnosis not present

## 2021-04-28 DIAGNOSIS — F1420 Cocaine dependence, uncomplicated: Secondary | ICD-10-CM | POA: Diagnosis not present

## 2021-04-28 DIAGNOSIS — F1220 Cannabis dependence, uncomplicated: Secondary | ICD-10-CM | POA: Diagnosis not present

## 2021-04-29 DIAGNOSIS — F1220 Cannabis dependence, uncomplicated: Secondary | ICD-10-CM | POA: Diagnosis not present

## 2021-04-29 DIAGNOSIS — K2100 Gastro-esophageal reflux disease with esophagitis, without bleeding: Secondary | ICD-10-CM | POA: Diagnosis not present

## 2021-04-29 DIAGNOSIS — F329 Major depressive disorder, single episode, unspecified: Secondary | ICD-10-CM | POA: Diagnosis not present

## 2021-04-29 DIAGNOSIS — F1020 Alcohol dependence, uncomplicated: Secondary | ICD-10-CM | POA: Diagnosis not present

## 2021-04-29 DIAGNOSIS — F319 Bipolar disorder, unspecified: Secondary | ICD-10-CM | POA: Diagnosis not present

## 2021-04-29 DIAGNOSIS — F1520 Other stimulant dependence, uncomplicated: Secondary | ICD-10-CM | POA: Diagnosis not present

## 2021-04-29 DIAGNOSIS — F1420 Cocaine dependence, uncomplicated: Secondary | ICD-10-CM | POA: Diagnosis not present

## 2021-04-29 DIAGNOSIS — F419 Anxiety disorder, unspecified: Secondary | ICD-10-CM | POA: Diagnosis not present

## 2021-04-30 DIAGNOSIS — K2100 Gastro-esophageal reflux disease with esophagitis, without bleeding: Secondary | ICD-10-CM | POA: Diagnosis not present

## 2021-04-30 DIAGNOSIS — F1520 Other stimulant dependence, uncomplicated: Secondary | ICD-10-CM | POA: Diagnosis not present

## 2021-04-30 DIAGNOSIS — F319 Bipolar disorder, unspecified: Secondary | ICD-10-CM | POA: Diagnosis not present

## 2021-04-30 DIAGNOSIS — F419 Anxiety disorder, unspecified: Secondary | ICD-10-CM | POA: Diagnosis not present

## 2021-04-30 DIAGNOSIS — F1220 Cannabis dependence, uncomplicated: Secondary | ICD-10-CM | POA: Diagnosis not present

## 2021-04-30 DIAGNOSIS — F1420 Cocaine dependence, uncomplicated: Secondary | ICD-10-CM | POA: Diagnosis not present

## 2021-04-30 DIAGNOSIS — F1020 Alcohol dependence, uncomplicated: Secondary | ICD-10-CM | POA: Diagnosis not present

## 2021-04-30 DIAGNOSIS — F329 Major depressive disorder, single episode, unspecified: Secondary | ICD-10-CM | POA: Diagnosis not present

## 2021-05-01 DIAGNOSIS — F1520 Other stimulant dependence, uncomplicated: Secondary | ICD-10-CM | POA: Diagnosis not present

## 2021-05-01 DIAGNOSIS — F329 Major depressive disorder, single episode, unspecified: Secondary | ICD-10-CM | POA: Diagnosis not present

## 2021-05-01 DIAGNOSIS — F419 Anxiety disorder, unspecified: Secondary | ICD-10-CM | POA: Diagnosis not present

## 2021-05-01 DIAGNOSIS — F319 Bipolar disorder, unspecified: Secondary | ICD-10-CM | POA: Diagnosis not present

## 2021-05-01 DIAGNOSIS — F1420 Cocaine dependence, uncomplicated: Secondary | ICD-10-CM | POA: Diagnosis not present

## 2021-05-01 DIAGNOSIS — F1220 Cannabis dependence, uncomplicated: Secondary | ICD-10-CM | POA: Diagnosis not present

## 2021-05-01 DIAGNOSIS — K2100 Gastro-esophageal reflux disease with esophagitis, without bleeding: Secondary | ICD-10-CM | POA: Diagnosis not present

## 2021-05-01 DIAGNOSIS — F1020 Alcohol dependence, uncomplicated: Secondary | ICD-10-CM | POA: Diagnosis not present

## 2021-05-02 DIAGNOSIS — F1420 Cocaine dependence, uncomplicated: Secondary | ICD-10-CM | POA: Diagnosis not present

## 2021-05-02 DIAGNOSIS — F319 Bipolar disorder, unspecified: Secondary | ICD-10-CM | POA: Diagnosis not present

## 2021-05-02 DIAGNOSIS — F329 Major depressive disorder, single episode, unspecified: Secondary | ICD-10-CM | POA: Diagnosis not present

## 2021-05-02 DIAGNOSIS — F1020 Alcohol dependence, uncomplicated: Secondary | ICD-10-CM | POA: Diagnosis not present

## 2021-05-02 DIAGNOSIS — F419 Anxiety disorder, unspecified: Secondary | ICD-10-CM | POA: Diagnosis not present

## 2021-05-02 DIAGNOSIS — F1520 Other stimulant dependence, uncomplicated: Secondary | ICD-10-CM | POA: Diagnosis not present

## 2021-05-02 DIAGNOSIS — K2100 Gastro-esophageal reflux disease with esophagitis, without bleeding: Secondary | ICD-10-CM | POA: Diagnosis not present

## 2021-05-02 DIAGNOSIS — F1220 Cannabis dependence, uncomplicated: Secondary | ICD-10-CM | POA: Diagnosis not present

## 2021-05-03 DIAGNOSIS — F319 Bipolar disorder, unspecified: Secondary | ICD-10-CM | POA: Diagnosis not present

## 2021-05-03 DIAGNOSIS — F1520 Other stimulant dependence, uncomplicated: Secondary | ICD-10-CM | POA: Diagnosis not present

## 2021-05-03 DIAGNOSIS — F1020 Alcohol dependence, uncomplicated: Secondary | ICD-10-CM | POA: Diagnosis not present

## 2021-05-03 DIAGNOSIS — K2100 Gastro-esophageal reflux disease with esophagitis, without bleeding: Secondary | ICD-10-CM | POA: Diagnosis not present

## 2021-05-03 DIAGNOSIS — F1420 Cocaine dependence, uncomplicated: Secondary | ICD-10-CM | POA: Diagnosis not present

## 2021-05-03 DIAGNOSIS — F419 Anxiety disorder, unspecified: Secondary | ICD-10-CM | POA: Diagnosis not present

## 2021-05-03 DIAGNOSIS — F1220 Cannabis dependence, uncomplicated: Secondary | ICD-10-CM | POA: Diagnosis not present

## 2021-05-03 DIAGNOSIS — F329 Major depressive disorder, single episode, unspecified: Secondary | ICD-10-CM | POA: Diagnosis not present

## 2021-05-04 DIAGNOSIS — F1420 Cocaine dependence, uncomplicated: Secondary | ICD-10-CM | POA: Diagnosis not present

## 2021-05-04 DIAGNOSIS — F1520 Other stimulant dependence, uncomplicated: Secondary | ICD-10-CM | POA: Diagnosis not present

## 2021-05-04 DIAGNOSIS — F419 Anxiety disorder, unspecified: Secondary | ICD-10-CM | POA: Diagnosis not present

## 2021-05-04 DIAGNOSIS — F329 Major depressive disorder, single episode, unspecified: Secondary | ICD-10-CM | POA: Diagnosis not present

## 2021-05-04 DIAGNOSIS — F1020 Alcohol dependence, uncomplicated: Secondary | ICD-10-CM | POA: Diagnosis not present

## 2021-05-04 DIAGNOSIS — F319 Bipolar disorder, unspecified: Secondary | ICD-10-CM | POA: Diagnosis not present

## 2021-05-04 DIAGNOSIS — F1220 Cannabis dependence, uncomplicated: Secondary | ICD-10-CM | POA: Diagnosis not present

## 2021-05-04 DIAGNOSIS — K2100 Gastro-esophageal reflux disease with esophagitis, without bleeding: Secondary | ICD-10-CM | POA: Diagnosis not present

## 2021-05-05 DIAGNOSIS — F329 Major depressive disorder, single episode, unspecified: Secondary | ICD-10-CM | POA: Diagnosis not present

## 2021-05-05 DIAGNOSIS — F1220 Cannabis dependence, uncomplicated: Secondary | ICD-10-CM | POA: Diagnosis not present

## 2021-05-05 DIAGNOSIS — F1420 Cocaine dependence, uncomplicated: Secondary | ICD-10-CM | POA: Diagnosis not present

## 2021-05-05 DIAGNOSIS — K2100 Gastro-esophageal reflux disease with esophagitis, without bleeding: Secondary | ICD-10-CM | POA: Diagnosis not present

## 2021-05-05 DIAGNOSIS — F1520 Other stimulant dependence, uncomplicated: Secondary | ICD-10-CM | POA: Diagnosis not present

## 2021-05-05 DIAGNOSIS — F419 Anxiety disorder, unspecified: Secondary | ICD-10-CM | POA: Diagnosis not present

## 2021-05-05 DIAGNOSIS — F319 Bipolar disorder, unspecified: Secondary | ICD-10-CM | POA: Diagnosis not present

## 2021-05-05 DIAGNOSIS — F1020 Alcohol dependence, uncomplicated: Secondary | ICD-10-CM | POA: Diagnosis not present

## 2021-05-06 ENCOUNTER — Encounter: Payer: Self-pay | Admitting: Family Medicine

## 2021-05-06 DIAGNOSIS — F1410 Cocaine abuse, uncomplicated: Secondary | ICD-10-CM | POA: Diagnosis not present

## 2021-05-06 DIAGNOSIS — F3181 Bipolar II disorder: Secondary | ICD-10-CM | POA: Diagnosis not present

## 2021-05-06 DIAGNOSIS — F411 Generalized anxiety disorder: Secondary | ICD-10-CM | POA: Diagnosis not present

## 2021-05-07 MED ORDER — ATOMOXETINE HCL 80 MG PO CAPS: 80.0000 mg | ORAL_CAPSULE | Freq: Every day | ORAL | 1 refills | Status: DC

## 2021-05-07 NOTE — Telephone Encounter (Signed)
I sent in the [employer]

## 2021-05-08 DIAGNOSIS — Z20822 Contact with and (suspected) exposure to covid-19: Secondary | ICD-10-CM | POA: Diagnosis not present

## 2021-05-12 DIAGNOSIS — K219 Gastro-esophageal reflux disease without esophagitis: Secondary | ICD-10-CM | POA: Diagnosis not present

## 2021-05-12 DIAGNOSIS — F1220 Cannabis dependence, uncomplicated: Secondary | ICD-10-CM | POA: Diagnosis not present

## 2021-05-12 DIAGNOSIS — F1020 Alcohol dependence, uncomplicated: Secondary | ICD-10-CM | POA: Diagnosis not present

## 2021-05-12 DIAGNOSIS — F419 Anxiety disorder, unspecified: Secondary | ICD-10-CM | POA: Diagnosis not present

## 2021-05-12 DIAGNOSIS — F1420 Cocaine dependence, uncomplicated: Secondary | ICD-10-CM | POA: Diagnosis not present

## 2021-05-12 DIAGNOSIS — F1520 Other stimulant dependence, uncomplicated: Secondary | ICD-10-CM | POA: Diagnosis not present

## 2021-05-12 DIAGNOSIS — F319 Bipolar disorder, unspecified: Secondary | ICD-10-CM | POA: Diagnosis not present

## 2021-05-13 DIAGNOSIS — F1220 Cannabis dependence, uncomplicated: Secondary | ICD-10-CM | POA: Diagnosis not present

## 2021-05-13 DIAGNOSIS — F319 Bipolar disorder, unspecified: Secondary | ICD-10-CM | POA: Diagnosis not present

## 2021-05-13 DIAGNOSIS — K219 Gastro-esophageal reflux disease without esophagitis: Secondary | ICD-10-CM | POA: Diagnosis not present

## 2021-05-13 DIAGNOSIS — F1520 Other stimulant dependence, uncomplicated: Secondary | ICD-10-CM | POA: Diagnosis not present

## 2021-05-13 DIAGNOSIS — F1020 Alcohol dependence, uncomplicated: Secondary | ICD-10-CM | POA: Diagnosis not present

## 2021-05-13 DIAGNOSIS — F1420 Cocaine dependence, uncomplicated: Secondary | ICD-10-CM | POA: Diagnosis not present

## 2021-05-13 DIAGNOSIS — F419 Anxiety disorder, unspecified: Secondary | ICD-10-CM | POA: Diagnosis not present

## 2021-05-19 DIAGNOSIS — K219 Gastro-esophageal reflux disease without esophagitis: Secondary | ICD-10-CM | POA: Diagnosis not present

## 2021-05-19 DIAGNOSIS — F319 Bipolar disorder, unspecified: Secondary | ICD-10-CM | POA: Diagnosis not present

## 2021-05-19 DIAGNOSIS — F419 Anxiety disorder, unspecified: Secondary | ICD-10-CM | POA: Diagnosis not present

## 2021-05-19 DIAGNOSIS — F1020 Alcohol dependence, uncomplicated: Secondary | ICD-10-CM | POA: Diagnosis not present

## 2021-05-19 DIAGNOSIS — F1220 Cannabis dependence, uncomplicated: Secondary | ICD-10-CM | POA: Diagnosis not present

## 2021-05-19 DIAGNOSIS — F1520 Other stimulant dependence, uncomplicated: Secondary | ICD-10-CM | POA: Diagnosis not present

## 2021-05-19 DIAGNOSIS — F1420 Cocaine dependence, uncomplicated: Secondary | ICD-10-CM | POA: Diagnosis not present

## 2021-05-20 DIAGNOSIS — K219 Gastro-esophageal reflux disease without esophagitis: Secondary | ICD-10-CM | POA: Diagnosis not present

## 2021-05-20 DIAGNOSIS — F1520 Other stimulant dependence, uncomplicated: Secondary | ICD-10-CM | POA: Diagnosis not present

## 2021-05-20 DIAGNOSIS — F1220 Cannabis dependence, uncomplicated: Secondary | ICD-10-CM | POA: Diagnosis not present

## 2021-05-20 DIAGNOSIS — F1020 Alcohol dependence, uncomplicated: Secondary | ICD-10-CM | POA: Diagnosis not present

## 2021-05-20 DIAGNOSIS — F319 Bipolar disorder, unspecified: Secondary | ICD-10-CM | POA: Diagnosis not present

## 2021-05-20 DIAGNOSIS — F419 Anxiety disorder, unspecified: Secondary | ICD-10-CM | POA: Diagnosis not present

## 2021-05-20 DIAGNOSIS — F1420 Cocaine dependence, uncomplicated: Secondary | ICD-10-CM | POA: Diagnosis not present

## 2021-05-22 DIAGNOSIS — F319 Bipolar disorder, unspecified: Secondary | ICD-10-CM | POA: Diagnosis not present

## 2021-05-22 DIAGNOSIS — F1520 Other stimulant dependence, uncomplicated: Secondary | ICD-10-CM | POA: Diagnosis not present

## 2021-05-22 DIAGNOSIS — K219 Gastro-esophageal reflux disease without esophagitis: Secondary | ICD-10-CM | POA: Diagnosis not present

## 2021-05-22 DIAGNOSIS — F1020 Alcohol dependence, uncomplicated: Secondary | ICD-10-CM | POA: Diagnosis not present

## 2021-05-22 DIAGNOSIS — F1420 Cocaine dependence, uncomplicated: Secondary | ICD-10-CM | POA: Diagnosis not present

## 2021-05-22 DIAGNOSIS — F1220 Cannabis dependence, uncomplicated: Secondary | ICD-10-CM | POA: Diagnosis not present

## 2021-05-22 DIAGNOSIS — F419 Anxiety disorder, unspecified: Secondary | ICD-10-CM | POA: Diagnosis not present

## 2021-05-26 DIAGNOSIS — F1520 Other stimulant dependence, uncomplicated: Secondary | ICD-10-CM | POA: Diagnosis not present

## 2021-05-26 DIAGNOSIS — F1220 Cannabis dependence, uncomplicated: Secondary | ICD-10-CM | POA: Diagnosis not present

## 2021-05-26 DIAGNOSIS — F1420 Cocaine dependence, uncomplicated: Secondary | ICD-10-CM | POA: Diagnosis not present

## 2021-05-26 DIAGNOSIS — F419 Anxiety disorder, unspecified: Secondary | ICD-10-CM | POA: Diagnosis not present

## 2021-05-26 DIAGNOSIS — F1020 Alcohol dependence, uncomplicated: Secondary | ICD-10-CM | POA: Diagnosis not present

## 2021-05-26 DIAGNOSIS — F319 Bipolar disorder, unspecified: Secondary | ICD-10-CM | POA: Diagnosis not present

## 2021-05-26 DIAGNOSIS — K219 Gastro-esophageal reflux disease without esophagitis: Secondary | ICD-10-CM | POA: Diagnosis not present

## 2021-05-27 DIAGNOSIS — F1020 Alcohol dependence, uncomplicated: Secondary | ICD-10-CM | POA: Diagnosis not present

## 2021-05-27 DIAGNOSIS — F1420 Cocaine dependence, uncomplicated: Secondary | ICD-10-CM | POA: Diagnosis not present

## 2021-05-27 DIAGNOSIS — F419 Anxiety disorder, unspecified: Secondary | ICD-10-CM | POA: Diagnosis not present

## 2021-05-27 DIAGNOSIS — F1220 Cannabis dependence, uncomplicated: Secondary | ICD-10-CM | POA: Diagnosis not present

## 2021-05-27 DIAGNOSIS — K219 Gastro-esophageal reflux disease without esophagitis: Secondary | ICD-10-CM | POA: Diagnosis not present

## 2021-05-27 DIAGNOSIS — F1520 Other stimulant dependence, uncomplicated: Secondary | ICD-10-CM | POA: Diagnosis not present

## 2021-05-27 DIAGNOSIS — F319 Bipolar disorder, unspecified: Secondary | ICD-10-CM | POA: Diagnosis not present

## 2021-05-29 DIAGNOSIS — F1020 Alcohol dependence, uncomplicated: Secondary | ICD-10-CM | POA: Diagnosis not present

## 2021-05-29 DIAGNOSIS — F1520 Other stimulant dependence, uncomplicated: Secondary | ICD-10-CM | POA: Diagnosis not present

## 2021-05-29 DIAGNOSIS — F419 Anxiety disorder, unspecified: Secondary | ICD-10-CM | POA: Diagnosis not present

## 2021-05-29 DIAGNOSIS — F319 Bipolar disorder, unspecified: Secondary | ICD-10-CM | POA: Diagnosis not present

## 2021-05-29 DIAGNOSIS — F1420 Cocaine dependence, uncomplicated: Secondary | ICD-10-CM | POA: Diagnosis not present

## 2021-05-29 DIAGNOSIS — K219 Gastro-esophageal reflux disease without esophagitis: Secondary | ICD-10-CM | POA: Diagnosis not present

## 2021-05-29 DIAGNOSIS — F1220 Cannabis dependence, uncomplicated: Secondary | ICD-10-CM | POA: Diagnosis not present

## 2021-06-02 DIAGNOSIS — K219 Gastro-esophageal reflux disease without esophagitis: Secondary | ICD-10-CM | POA: Diagnosis not present

## 2021-06-02 DIAGNOSIS — F319 Bipolar disorder, unspecified: Secondary | ICD-10-CM | POA: Diagnosis not present

## 2021-06-02 DIAGNOSIS — F1420 Cocaine dependence, uncomplicated: Secondary | ICD-10-CM | POA: Diagnosis not present

## 2021-06-02 DIAGNOSIS — F419 Anxiety disorder, unspecified: Secondary | ICD-10-CM | POA: Diagnosis not present

## 2021-06-02 DIAGNOSIS — F1520 Other stimulant dependence, uncomplicated: Secondary | ICD-10-CM | POA: Diagnosis not present

## 2021-06-02 DIAGNOSIS — F1220 Cannabis dependence, uncomplicated: Secondary | ICD-10-CM | POA: Diagnosis not present

## 2021-06-02 DIAGNOSIS — F1020 Alcohol dependence, uncomplicated: Secondary | ICD-10-CM | POA: Diagnosis not present

## 2021-06-03 DIAGNOSIS — F1020 Alcohol dependence, uncomplicated: Secondary | ICD-10-CM | POA: Diagnosis not present

## 2021-06-05 DIAGNOSIS — F1020 Alcohol dependence, uncomplicated: Secondary | ICD-10-CM | POA: Diagnosis not present

## 2021-06-09 DIAGNOSIS — F1020 Alcohol dependence, uncomplicated: Secondary | ICD-10-CM | POA: Diagnosis not present

## 2021-06-10 DIAGNOSIS — F1020 Alcohol dependence, uncomplicated: Secondary | ICD-10-CM | POA: Diagnosis not present

## 2021-06-12 DIAGNOSIS — F1020 Alcohol dependence, uncomplicated: Secondary | ICD-10-CM | POA: Diagnosis not present

## 2021-06-16 DIAGNOSIS — F1020 Alcohol dependence, uncomplicated: Secondary | ICD-10-CM | POA: Diagnosis not present

## 2021-06-17 DIAGNOSIS — F1020 Alcohol dependence, uncomplicated: Secondary | ICD-10-CM | POA: Diagnosis not present

## 2021-06-19 DIAGNOSIS — F1020 Alcohol dependence, uncomplicated: Secondary | ICD-10-CM | POA: Diagnosis not present

## 2021-06-23 DIAGNOSIS — F1020 Alcohol dependence, uncomplicated: Secondary | ICD-10-CM | POA: Diagnosis not present

## 2021-06-24 DIAGNOSIS — F1020 Alcohol dependence, uncomplicated: Secondary | ICD-10-CM | POA: Diagnosis not present

## 2021-06-25 ENCOUNTER — Encounter: Payer: Self-pay | Admitting: Family Medicine

## 2021-06-25 DIAGNOSIS — L821 Other seborrheic keratosis: Secondary | ICD-10-CM | POA: Diagnosis not present

## 2021-06-25 DIAGNOSIS — M9903 Segmental and somatic dysfunction of lumbar region: Secondary | ICD-10-CM | POA: Diagnosis not present

## 2021-06-25 DIAGNOSIS — Z85828 Personal history of other malignant neoplasm of skin: Secondary | ICD-10-CM | POA: Diagnosis not present

## 2021-06-25 DIAGNOSIS — L820 Inflamed seborrheic keratosis: Secondary | ICD-10-CM | POA: Diagnosis not present

## 2021-06-25 DIAGNOSIS — D0439 Carcinoma in situ of skin of other parts of face: Secondary | ICD-10-CM | POA: Diagnosis not present

## 2021-06-25 DIAGNOSIS — L720 Epidermal cyst: Secondary | ICD-10-CM | POA: Diagnosis not present

## 2021-06-25 DIAGNOSIS — M5136 Other intervertebral disc degeneration, lumbar region: Secondary | ICD-10-CM | POA: Diagnosis not present

## 2021-06-26 DIAGNOSIS — M9903 Segmental and somatic dysfunction of lumbar region: Secondary | ICD-10-CM | POA: Diagnosis not present

## 2021-06-26 DIAGNOSIS — M5136 Other intervertebral disc degeneration, lumbar region: Secondary | ICD-10-CM | POA: Diagnosis not present

## 2021-06-26 DIAGNOSIS — F1020 Alcohol dependence, uncomplicated: Secondary | ICD-10-CM | POA: Diagnosis not present

## 2021-06-27 ENCOUNTER — Ambulatory Visit: Payer: BC Managed Care – PPO | Admitting: Physician Assistant

## 2021-06-27 DIAGNOSIS — M545 Low back pain, unspecified: Secondary | ICD-10-CM | POA: Diagnosis not present

## 2021-06-30 DIAGNOSIS — F1020 Alcohol dependence, uncomplicated: Secondary | ICD-10-CM | POA: Diagnosis not present

## 2021-06-30 DIAGNOSIS — M9903 Segmental and somatic dysfunction of lumbar region: Secondary | ICD-10-CM | POA: Diagnosis not present

## 2021-06-30 DIAGNOSIS — M5136 Other intervertebral disc degeneration, lumbar region: Secondary | ICD-10-CM | POA: Diagnosis not present

## 2021-07-01 DIAGNOSIS — F1020 Alcohol dependence, uncomplicated: Secondary | ICD-10-CM | POA: Diagnosis not present

## 2021-07-01 DIAGNOSIS — M9903 Segmental and somatic dysfunction of lumbar region: Secondary | ICD-10-CM | POA: Diagnosis not present

## 2021-07-01 DIAGNOSIS — M5136 Other intervertebral disc degeneration, lumbar region: Secondary | ICD-10-CM | POA: Diagnosis not present

## 2021-07-03 DIAGNOSIS — M9903 Segmental and somatic dysfunction of lumbar region: Secondary | ICD-10-CM | POA: Diagnosis not present

## 2021-07-03 DIAGNOSIS — M5136 Other intervertebral disc degeneration, lumbar region: Secondary | ICD-10-CM | POA: Diagnosis not present

## 2021-07-03 DIAGNOSIS — F1020 Alcohol dependence, uncomplicated: Secondary | ICD-10-CM | POA: Diagnosis not present

## 2021-07-05 ENCOUNTER — Other Ambulatory Visit: Payer: Self-pay | Admitting: Family Medicine

## 2021-07-05 ENCOUNTER — Encounter: Payer: Self-pay | Admitting: Family Medicine

## 2021-07-05 DIAGNOSIS — B029 Zoster without complications: Secondary | ICD-10-CM

## 2021-07-06 DIAGNOSIS — M545 Low back pain, unspecified: Secondary | ICD-10-CM | POA: Diagnosis not present

## 2021-07-07 ENCOUNTER — Other Ambulatory Visit: Payer: Self-pay

## 2021-07-07 DIAGNOSIS — B029 Zoster without complications: Secondary | ICD-10-CM

## 2021-07-07 MED ORDER — VALACYCLOVIR HCL 500 MG PO TABS: 500.0000 mg | ORAL_TABLET | Freq: Two times a day (BID) | ORAL | 1 refills | Status: DC

## 2021-07-07 MED ORDER — ATOMOXETINE HCL 80 MG PO CAPS: 80.0000 mg | ORAL_CAPSULE | Freq: Every day | ORAL | 3 refills | Status: DC

## 2021-07-07 MED ORDER — BIMATOPROST 0.03 % EX SOLN: 1.0000 "application " | Freq: Every day | CUTANEOUS | 3 refills | Status: DC

## 2021-07-07 MED ORDER — VITAMIN D 25 MCG (1000 UNIT) PO TABS: 1000.0000 [IU] | ORAL_TABLET | Freq: Every day | ORAL | 3 refills | Status: AC

## 2021-07-07 MED ORDER — DEXLANSOPRAZOLE 60 MG PO CPDR: 60.0000 mg | DELAYED_RELEASE_CAPSULE | Freq: Every day | ORAL | 3 refills | Status: AC

## 2021-07-07 NOTE — Telephone Encounter (Signed)
Last OV- 01/09/21  Vit D3 and Latisse 0.03 eyedrops sent by historical provider.   Please advise if okay for refill.

## 2021-07-07 NOTE — Telephone Encounter (Signed)
Refills were sent in

## 2021-07-08 ENCOUNTER — Encounter: Payer: Self-pay | Admitting: Family Medicine

## 2021-07-08 DIAGNOSIS — F1410 Cocaine abuse, uncomplicated: Secondary | ICD-10-CM | POA: Diagnosis not present

## 2021-07-08 DIAGNOSIS — F411 Generalized anxiety disorder: Secondary | ICD-10-CM | POA: Diagnosis not present

## 2021-07-08 DIAGNOSIS — F3181 Bipolar II disorder: Secondary | ICD-10-CM | POA: Diagnosis not present

## 2021-07-08 MED ORDER — BIMATOPROST 0.03 % EX SOLN: 1.0000 "application " | Freq: Every day | CUTANEOUS | 3 refills | Status: AC

## 2021-07-08 NOTE — Telephone Encounter (Signed)
I sent it to the [facility]

## 2021-07-11 DIAGNOSIS — M545 Low back pain, unspecified: Secondary | ICD-10-CM | POA: Diagnosis not present

## 2021-07-14 ENCOUNTER — Other Ambulatory Visit: Payer: Self-pay | Admitting: Family Medicine

## 2021-07-14 ENCOUNTER — Encounter: Payer: Self-pay | Admitting: Family Medicine

## 2021-07-14 ENCOUNTER — Telehealth: Payer: Self-pay

## 2021-07-14 MED ORDER — ATOMOXETINE HCL 80 MG PO CAPS: 160.0000 mg | ORAL_CAPSULE | Freq: Every day | ORAL | 3 refills | Status: AC

## 2021-07-14 NOTE — Telephone Encounter (Signed)
Prior authorization complete for Strattera 15m.

## 2021-07-14 NOTE — Telephone Encounter (Signed)
I sent in a new RX to take 2 tabs a day (160 mg)

## 2021-07-15 DIAGNOSIS — M5416 Radiculopathy, lumbar region: Secondary | ICD-10-CM | POA: Insufficient documentation

## 2021-07-16 NOTE — Telephone Encounter (Addendum)
Prior authorization has been denied for 60 capsules per month for 30 days.    Insurance is only willing to pay for 30 capsules per month for 30 days.   Will do appeal

## 2021-07-22 DIAGNOSIS — M5416 Radiculopathy, lumbar region: Secondary | ICD-10-CM | POA: Diagnosis not present

## 2021-07-30 ENCOUNTER — Encounter: Payer: Self-pay | Admitting: Family Medicine

## 2021-07-30 DIAGNOSIS — M9904 Segmental and somatic dysfunction of sacral region: Secondary | ICD-10-CM | POA: Diagnosis not present

## 2021-07-30 DIAGNOSIS — M9903 Segmental and somatic dysfunction of lumbar region: Secondary | ICD-10-CM | POA: Diagnosis not present

## 2021-07-30 DIAGNOSIS — M9902 Segmental and somatic dysfunction of thoracic region: Secondary | ICD-10-CM | POA: Diagnosis not present

## 2021-07-30 DIAGNOSIS — M5416 Radiculopathy, lumbar region: Secondary | ICD-10-CM | POA: Diagnosis not present

## 2021-07-31 NOTE — Telephone Encounter (Signed)
I saw the MRI report. She may end up needing surgery for the disc, but not necessarily. Please resend the Dexilant to the pharmacy of her choice

## 2021-07-31 NOTE — Telephone Encounter (Signed)
Please refill her Dexilant for one year. Tell her I have NOT seen any MRI results

## 2021-08-01 DIAGNOSIS — M9902 Segmental and somatic dysfunction of thoracic region: Secondary | ICD-10-CM | POA: Diagnosis not present

## 2021-08-01 DIAGNOSIS — M9904 Segmental and somatic dysfunction of sacral region: Secondary | ICD-10-CM | POA: Diagnosis not present

## 2021-08-01 DIAGNOSIS — M9903 Segmental and somatic dysfunction of lumbar region: Secondary | ICD-10-CM | POA: Diagnosis not present

## 2021-08-01 DIAGNOSIS — M5416 Radiculopathy, lumbar region: Secondary | ICD-10-CM | POA: Diagnosis not present

## 2021-08-04 DIAGNOSIS — M9904 Segmental and somatic dysfunction of sacral region: Secondary | ICD-10-CM | POA: Diagnosis not present

## 2021-08-04 DIAGNOSIS — M5416 Radiculopathy, lumbar region: Secondary | ICD-10-CM | POA: Diagnosis not present

## 2021-08-04 DIAGNOSIS — M9902 Segmental and somatic dysfunction of thoracic region: Secondary | ICD-10-CM | POA: Diagnosis not present

## 2021-08-04 DIAGNOSIS — M9903 Segmental and somatic dysfunction of lumbar region: Secondary | ICD-10-CM | POA: Diagnosis not present

## 2021-08-04 NOTE — Telephone Encounter (Signed)
Instead of Dexilant, try Omeprazole OTC (20 mg) and take 2 at a time

## 2021-08-06 ENCOUNTER — Other Ambulatory Visit: Payer: Self-pay

## 2021-08-06 ENCOUNTER — Encounter: Payer: Self-pay | Admitting: Family Medicine

## 2021-08-06 DIAGNOSIS — M545 Low back pain, unspecified: Secondary | ICD-10-CM

## 2021-08-06 DIAGNOSIS — M9903 Segmental and somatic dysfunction of lumbar region: Secondary | ICD-10-CM | POA: Diagnosis not present

## 2021-08-06 DIAGNOSIS — M5416 Radiculopathy, lumbar region: Secondary | ICD-10-CM | POA: Diagnosis not present

## 2021-08-06 DIAGNOSIS — M9902 Segmental and somatic dysfunction of thoracic region: Secondary | ICD-10-CM | POA: Diagnosis not present

## 2021-08-06 DIAGNOSIS — M9904 Segmental and somatic dysfunction of sacral region: Secondary | ICD-10-CM | POA: Diagnosis not present

## 2021-08-06 MED ORDER — DICLOFENAC SODIUM 75 MG PO TBEC: DELAYED_RELEASE_TABLET | ORAL | 2 refills | Status: DC

## 2021-08-06 NOTE — Telephone Encounter (Signed)
Call in Diclofenac 75 mg BID as needed for pain, #60 with 2 rf

## 2021-08-08 DIAGNOSIS — M9904 Segmental and somatic dysfunction of sacral region: Secondary | ICD-10-CM | POA: Diagnosis not present

## 2021-08-08 DIAGNOSIS — M9903 Segmental and somatic dysfunction of lumbar region: Secondary | ICD-10-CM | POA: Diagnosis not present

## 2021-08-08 DIAGNOSIS — M5416 Radiculopathy, lumbar region: Secondary | ICD-10-CM | POA: Diagnosis not present

## 2021-08-08 DIAGNOSIS — M9902 Segmental and somatic dysfunction of thoracic region: Secondary | ICD-10-CM | POA: Diagnosis not present

## 2021-08-11 DIAGNOSIS — M9902 Segmental and somatic dysfunction of thoracic region: Secondary | ICD-10-CM | POA: Diagnosis not present

## 2021-08-11 DIAGNOSIS — M9904 Segmental and somatic dysfunction of sacral region: Secondary | ICD-10-CM | POA: Diagnosis not present

## 2021-08-11 DIAGNOSIS — M5416 Radiculopathy, lumbar region: Secondary | ICD-10-CM | POA: Diagnosis not present

## 2021-08-11 DIAGNOSIS — M9903 Segmental and somatic dysfunction of lumbar region: Secondary | ICD-10-CM | POA: Diagnosis not present

## 2021-08-13 DIAGNOSIS — M9902 Segmental and somatic dysfunction of thoracic region: Secondary | ICD-10-CM | POA: Diagnosis not present

## 2021-08-13 DIAGNOSIS — M9903 Segmental and somatic dysfunction of lumbar region: Secondary | ICD-10-CM | POA: Diagnosis not present

## 2021-08-13 DIAGNOSIS — M5416 Radiculopathy, lumbar region: Secondary | ICD-10-CM | POA: Diagnosis not present

## 2021-08-13 DIAGNOSIS — M9904 Segmental and somatic dysfunction of sacral region: Secondary | ICD-10-CM | POA: Diagnosis not present

## 2021-08-14 ENCOUNTER — Other Ambulatory Visit (HOSPITAL_BASED_OUTPATIENT_CLINIC_OR_DEPARTMENT_OTHER): Payer: Self-pay | Admitting: Obstetrics & Gynecology

## 2021-08-15 DIAGNOSIS — M5442 Lumbago with sciatica, left side: Secondary | ICD-10-CM | POA: Diagnosis not present

## 2021-08-15 DIAGNOSIS — R2689 Other abnormalities of gait and mobility: Secondary | ICD-10-CM | POA: Diagnosis not present

## 2021-08-15 DIAGNOSIS — M62830 Muscle spasm of back: Secondary | ICD-10-CM | POA: Diagnosis not present

## 2021-08-18 DIAGNOSIS — M9902 Segmental and somatic dysfunction of thoracic region: Secondary | ICD-10-CM | POA: Diagnosis not present

## 2021-08-18 DIAGNOSIS — M9904 Segmental and somatic dysfunction of sacral region: Secondary | ICD-10-CM | POA: Diagnosis not present

## 2021-08-18 DIAGNOSIS — M62830 Muscle spasm of back: Secondary | ICD-10-CM | POA: Diagnosis not present

## 2021-08-18 DIAGNOSIS — M5442 Lumbago with sciatica, left side: Secondary | ICD-10-CM | POA: Diagnosis not present

## 2021-08-18 DIAGNOSIS — M9903 Segmental and somatic dysfunction of lumbar region: Secondary | ICD-10-CM | POA: Diagnosis not present

## 2021-08-18 DIAGNOSIS — R2689 Other abnormalities of gait and mobility: Secondary | ICD-10-CM | POA: Diagnosis not present

## 2021-08-18 DIAGNOSIS — M5416 Radiculopathy, lumbar region: Secondary | ICD-10-CM | POA: Diagnosis not present

## 2021-08-20 HISTORY — PX: LAMINECTOMY: SHX219

## 2021-08-22 DIAGNOSIS — M9904 Segmental and somatic dysfunction of sacral region: Secondary | ICD-10-CM | POA: Diagnosis not present

## 2021-08-22 DIAGNOSIS — M62830 Muscle spasm of back: Secondary | ICD-10-CM | POA: Diagnosis not present

## 2021-08-22 DIAGNOSIS — M9902 Segmental and somatic dysfunction of thoracic region: Secondary | ICD-10-CM | POA: Diagnosis not present

## 2021-08-22 DIAGNOSIS — M5416 Radiculopathy, lumbar region: Secondary | ICD-10-CM | POA: Diagnosis not present

## 2021-08-22 DIAGNOSIS — M5442 Lumbago with sciatica, left side: Secondary | ICD-10-CM | POA: Diagnosis not present

## 2021-08-22 DIAGNOSIS — R2689 Other abnormalities of gait and mobility: Secondary | ICD-10-CM | POA: Diagnosis not present

## 2021-08-22 DIAGNOSIS — M9903 Segmental and somatic dysfunction of lumbar region: Secondary | ICD-10-CM | POA: Diagnosis not present

## 2021-08-25 DIAGNOSIS — K219 Gastro-esophageal reflux disease without esophagitis: Secondary | ICD-10-CM | POA: Insufficient documentation

## 2021-08-25 DIAGNOSIS — M9903 Segmental and somatic dysfunction of lumbar region: Secondary | ICD-10-CM | POA: Diagnosis not present

## 2021-08-25 DIAGNOSIS — M9904 Segmental and somatic dysfunction of sacral region: Secondary | ICD-10-CM | POA: Diagnosis not present

## 2021-08-25 DIAGNOSIS — M48061 Spinal stenosis, lumbar region without neurogenic claudication: Secondary | ICD-10-CM | POA: Diagnosis not present

## 2021-08-25 DIAGNOSIS — M4310 Spondylolisthesis, site unspecified: Secondary | ICD-10-CM | POA: Diagnosis not present

## 2021-08-25 DIAGNOSIS — M519 Unspecified thoracic, thoracolumbar and lumbosacral intervertebral disc disorder: Secondary | ICD-10-CM | POA: Diagnosis not present

## 2021-08-25 DIAGNOSIS — M9902 Segmental and somatic dysfunction of thoracic region: Secondary | ICD-10-CM | POA: Diagnosis not present

## 2021-08-25 DIAGNOSIS — M5416 Radiculopathy, lumbar region: Secondary | ICD-10-CM | POA: Diagnosis not present

## 2021-08-26 ENCOUNTER — Encounter: Payer: Self-pay | Admitting: Family Medicine

## 2021-08-27 NOTE — Telephone Encounter (Signed)
I did not try because I have no way to do it  ?

## 2021-08-28 DIAGNOSIS — R2689 Other abnormalities of gait and mobility: Secondary | ICD-10-CM | POA: Diagnosis not present

## 2021-08-28 DIAGNOSIS — M62830 Muscle spasm of back: Secondary | ICD-10-CM | POA: Diagnosis not present

## 2021-08-28 DIAGNOSIS — M5442 Lumbago with sciatica, left side: Secondary | ICD-10-CM | POA: Diagnosis not present

## 2021-09-01 ENCOUNTER — Encounter: Payer: Self-pay | Admitting: Family Medicine

## 2021-09-01 DIAGNOSIS — Z9889 Other specified postprocedural states: Secondary | ICD-10-CM | POA: Diagnosis not present

## 2021-09-01 DIAGNOSIS — K219 Gastro-esophageal reflux disease without esophagitis: Secondary | ICD-10-CM | POA: Diagnosis not present

## 2021-09-01 DIAGNOSIS — M5116 Intervertebral disc disorders with radiculopathy, lumbar region: Secondary | ICD-10-CM | POA: Diagnosis not present

## 2021-09-01 DIAGNOSIS — M4316 Spondylolisthesis, lumbar region: Secondary | ICD-10-CM | POA: Diagnosis not present

## 2021-09-01 DIAGNOSIS — K589 Irritable bowel syndrome without diarrhea: Secondary | ICD-10-CM | POA: Diagnosis not present

## 2021-09-01 DIAGNOSIS — Z882 Allergy status to sulfonamides status: Secondary | ICD-10-CM | POA: Diagnosis not present

## 2021-09-01 DIAGNOSIS — Z87891 Personal history of nicotine dependence: Secondary | ICD-10-CM | POA: Diagnosis not present

## 2021-09-01 DIAGNOSIS — G4700 Insomnia, unspecified: Secondary | ICD-10-CM | POA: Diagnosis not present

## 2021-09-01 DIAGNOSIS — Z881 Allergy status to other antibiotic agents status: Secondary | ICD-10-CM | POA: Diagnosis not present

## 2021-09-01 DIAGNOSIS — Z01818 Encounter for other preprocedural examination: Secondary | ICD-10-CM | POA: Diagnosis not present

## 2021-09-01 DIAGNOSIS — Z79899 Other long term (current) drug therapy: Secondary | ICD-10-CM | POA: Diagnosis not present

## 2021-09-01 DIAGNOSIS — Z7952 Long term (current) use of systemic steroids: Secondary | ICD-10-CM | POA: Diagnosis not present

## 2021-09-01 DIAGNOSIS — M48061 Spinal stenosis, lumbar region without neurogenic claudication: Secondary | ICD-10-CM | POA: Diagnosis not present

## 2021-09-01 DIAGNOSIS — G4733 Obstructive sleep apnea (adult) (pediatric): Secondary | ICD-10-CM | POA: Diagnosis not present

## 2021-09-01 NOTE — Telephone Encounter (Signed)
Noted  

## 2021-09-02 DIAGNOSIS — M5442 Lumbago with sciatica, left side: Secondary | ICD-10-CM | POA: Diagnosis not present

## 2021-09-02 DIAGNOSIS — M62830 Muscle spasm of back: Secondary | ICD-10-CM | POA: Diagnosis not present

## 2021-09-02 DIAGNOSIS — R2689 Other abnormalities of gait and mobility: Secondary | ICD-10-CM | POA: Diagnosis not present

## 2021-09-05 DIAGNOSIS — Z7952 Long term (current) use of systemic steroids: Secondary | ICD-10-CM | POA: Diagnosis not present

## 2021-09-05 DIAGNOSIS — G4700 Insomnia, unspecified: Secondary | ICD-10-CM | POA: Diagnosis not present

## 2021-09-05 DIAGNOSIS — Z79899 Other long term (current) drug therapy: Secondary | ICD-10-CM | POA: Diagnosis not present

## 2021-09-05 DIAGNOSIS — M48061 Spinal stenosis, lumbar region without neurogenic claudication: Secondary | ICD-10-CM | POA: Diagnosis not present

## 2021-09-05 DIAGNOSIS — G4733 Obstructive sleep apnea (adult) (pediatric): Secondary | ICD-10-CM | POA: Diagnosis not present

## 2021-09-05 DIAGNOSIS — Z881 Allergy status to other antibiotic agents status: Secondary | ICD-10-CM | POA: Diagnosis not present

## 2021-09-05 DIAGNOSIS — Z9889 Other specified postprocedural states: Secondary | ICD-10-CM | POA: Diagnosis not present

## 2021-09-05 DIAGNOSIS — M4316 Spondylolisthesis, lumbar region: Secondary | ICD-10-CM | POA: Diagnosis not present

## 2021-09-05 DIAGNOSIS — Z882 Allergy status to sulfonamides status: Secondary | ICD-10-CM | POA: Diagnosis not present

## 2021-09-05 DIAGNOSIS — K219 Gastro-esophageal reflux disease without esophagitis: Secondary | ICD-10-CM | POA: Diagnosis not present

## 2021-09-05 DIAGNOSIS — Z87891 Personal history of nicotine dependence: Secondary | ICD-10-CM | POA: Diagnosis not present

## 2021-09-05 DIAGNOSIS — K589 Irritable bowel syndrome without diarrhea: Secondary | ICD-10-CM | POA: Diagnosis not present

## 2021-09-05 DIAGNOSIS — M5116 Intervertebral disc disorders with radiculopathy, lumbar region: Secondary | ICD-10-CM | POA: Diagnosis not present

## 2021-09-05 DIAGNOSIS — M5416 Radiculopathy, lumbar region: Secondary | ICD-10-CM | POA: Diagnosis not present

## 2021-09-05 DIAGNOSIS — M5136 Other intervertebral disc degeneration, lumbar region: Secondary | ICD-10-CM | POA: Diagnosis not present

## 2021-09-09 DIAGNOSIS — F3181 Bipolar II disorder: Secondary | ICD-10-CM | POA: Diagnosis not present

## 2021-09-09 DIAGNOSIS — F411 Generalized anxiety disorder: Secondary | ICD-10-CM | POA: Diagnosis not present

## 2021-09-09 DIAGNOSIS — F1410 Cocaine abuse, uncomplicated: Secondary | ICD-10-CM | POA: Diagnosis not present

## 2021-09-19 DIAGNOSIS — M542 Cervicalgia: Secondary | ICD-10-CM | POA: Diagnosis not present

## 2021-09-19 DIAGNOSIS — Z4889 Encounter for other specified surgical aftercare: Secondary | ICD-10-CM | POA: Diagnosis not present

## 2021-09-19 DIAGNOSIS — Z4802 Encounter for removal of sutures: Secondary | ICD-10-CM | POA: Diagnosis not present

## 2021-09-22 ENCOUNTER — Ambulatory Visit (INDEPENDENT_AMBULATORY_CARE_PROVIDER_SITE_OTHER): Payer: BC Managed Care – PPO | Admitting: Obstetrics & Gynecology

## 2021-09-22 ENCOUNTER — Encounter (HOSPITAL_BASED_OUTPATIENT_CLINIC_OR_DEPARTMENT_OTHER): Payer: Self-pay | Admitting: Obstetrics & Gynecology

## 2021-09-22 ENCOUNTER — Other Ambulatory Visit (HOSPITAL_COMMUNITY)
Admission: RE | Admit: 2021-09-22 | Discharge: 2021-09-22 | Disposition: A | Discharge status: Home / Self Care | Payer: BC Managed Care – PPO | Source: Ambulatory Visit | Attending: Obstetrics & Gynecology | Admitting: Obstetrics & Gynecology

## 2021-09-22 VITALS — BP 120/75 | HR 70 | Ht 67.75 in | Wt 150.4 lb

## 2021-09-22 DIAGNOSIS — Z79890 Hormone replacement therapy (postmenopausal): Secondary | ICD-10-CM | POA: Diagnosis not present

## 2021-09-22 DIAGNOSIS — Z01419 Encounter for gynecological examination (general) (routine) without abnormal findings: Secondary | ICD-10-CM | POA: Diagnosis not present

## 2021-09-22 DIAGNOSIS — B009 Herpesviral infection, unspecified: Secondary | ICD-10-CM

## 2021-09-22 DIAGNOSIS — F1911 Other psychoactive substance abuse, in remission: Secondary | ICD-10-CM

## 2021-09-22 DIAGNOSIS — Z1231 Encounter for screening mammogram for malignant neoplasm of breast: Secondary | ICD-10-CM | POA: Diagnosis not present

## 2021-09-22 DIAGNOSIS — B029 Zoster without complications: Secondary | ICD-10-CM

## 2021-09-22 LAB — HM PAP SMEAR

## 2021-09-22 MED ORDER — VALACYCLOVIR HCL 500 MG PO TABS: 500.0000 mg | ORAL_TABLET | Freq: Every day | ORAL | 4 refills | Status: DC

## 2021-09-22 NOTE — Progress Notes (Signed)
61 y.o. G38P2002 Married White or Caucasian female here for annual exam.  Living in [place] with spouse.  [employer] space.  She just had a laminectomy two weeks ago.  This was after an injury.  She could barely walk due to sciatica.   ? ?Denies vaginal bleeding.  Still having hot flashes.  Would like hormones checked. ? ?Patient's last menstrual period was 09/14/2014.          ?Sexually active: Yes.    ?The current method of family planning is post menopausal status.    ?Smoker:  no ? ?Health Maintenance: ?Pap:  09/01/2018 Negative ?History of abnormal Pap:  yes ?MMG:  11/10/2019 Negative ?Colonoscopy:  10/19/2013.  Follow up 10 years ?BMD:   10/13/2005.  Plan around age 45. ?Screening Labs: hormonal labs obtained today ? ? reports that she has quit smoking. Her smoking use included cigarettes. She started smoking about 38 years ago. She has never used smokeless tobacco. She reports current alcohol use. She reports current drug use. Drug: Marijuana. ? ?Past Medical History:  ?Diagnosis Date  ? Abnormal Pap smear of cervix 2013  ? ASCUS  ? Acne   ? sees Dr. Jarome Matin for skin checks and acne  ? Anemia   ? Anxiety   ? Bipolar 1 disorder ([place])   ? Bipolar depression ([place])   ? sees Pauline Good NP and sees Beckey Downing for  therapy  ? Constipation   ? GERD (gastroesophageal reflux disease)   ? Herpes simplex   ? IBS (irritable bowel syndrome)   ? Insomnia   ? Migraines   ? Pneumonia 2019  ? Substance abuse ([place])   ? hx of cocaine abuse, sober since 04-21-20  ? ? ?Past Surgical History:  ?Procedure Laterality Date  ? BASAL CELL CARCINOMA EXCISION    ? Dr Amy Martinique  ? BREAST ENHANCEMENT SURGERY    ? [place]  ? COLONOSCOPY  2015  ? per Dr. Collene Mares, clear, repeat in 10 yrs   ? COLPOSCOPY  10/07/11  ? [facility] I  ? ESOPHAGOGASTRODUODENOSCOPY  09/07/2006  ? Dr Ardis Hughs  ? ? ?Current Outpatient Medications  ?Medication Sig Dispense Refill  ? bimatoprost (LATISSE) 0.03 % ophthalmic solution Place 1 application into both eyes  daily. 3 mL 3  ? clobetasol (TEMOVATE) 0.05 % external solution APPLY TO AFFECTED AREA TWICE A DAY 50 mL 5  ? diclofenac (VOLTAREN) 75 MG EC tablet Take 1 tablet by mouth 2 times daily as needed for pain. 60 tablet 2  ? estradiol (VIVELLE-DOT) 0.025 MG/24HR PLACE 1 PATCH ONTO THE SKIN 2 TIMES A WEEK. 24 patch 0  ? ibuprofen (ADVIL,MOTRIN) 800 MG tablet TAKE 1 TABLET (800 MG TOTAL) BY MOUTH EVERY 8 (EIGHT) HOURS AS NEEDED FOR MODERATE PAIN. 60 tablet 5  ? lamoTRIgine (LAMICTAL) 100 MG tablet Take 150 mg by mouth daily.     ? lidocaine (XYLOCAINE) 5 % ointment Apply small amount topically ever 6 hours as needed for hemorrhoid pain 1.25 g 0  ? progesterone (PROMETRIUM) 200 MG capsule Take 1 capsule (200 mg total) by mouth daily. Take days 1-15 each month. 45 capsule 2  ? sertraline (ZOLOFT) 100 MG tablet     ? traZODone (DESYREL) 50 MG tablet TAKE 1-2 TABLET AT BEDTIME AS NEEDED FOR SLEEP 180 tablet 3  ? valACYclovir (VALTREX) 500 MG tablet Take 1 tablet (500 mg total) by mouth 2 (two) times daily. 180 tablet 1  ? atomoxetine (STRATTERA) 80 MG  capsule Take 2 capsules (160 mg total) by mouth daily. (Patient not taking: Reported on 09/22/2021) 180 capsule 3  ? cholecalciferol (VITAMIN D3) 25 MCG (1000 UNIT) tablet Take 1 tablet (1,000 Units total) by mouth daily. (Patient not taking: Reported on 09/22/2021) 90 tablet 3  ? cyclobenzaprine (FLEXERIL) 10 MG tablet Take 1 tablet (10 mg total) by mouth 3 (three) times daily as needed for muscle spasms. (Patient not taking: Reported on 09/22/2021) 90 tablet 2  ? dexlansoprazole (DEXILANT) 60 MG capsule Take 1 capsule (60 mg total) by mouth daily. (Patient not taking: Reported on 09/22/2021) 90 capsule 3  ? Multiple Vitamin (MULTI-VITAMIN PO) Take by mouth daily. (Patient not taking: Reported on 09/22/2021)    ? ?No current facility-administered medications for this visit.  ? ? ?Family History  ?Problem Relation Age of Onset  ? Cancer Mother   ? Bladder Cancer Mother   ? Hypertension  Mother   ? Alcohol abuse Other   ? Arthritis Other   ? Cancer Other   ?     breast, ovrarian, uterine  ? ? ?Review of Systems  ?All other systems reviewed and are negative. ? ?Exam:   ?BP 120/75 (BP Location: Left Arm, Patient Position: Sitting, Cuff Size: Normal)   Pulse 70   Ht 5' 7.75" (1.721 m)   Wt 150 lb 6.4 oz (68.2 kg)   LMP 09/14/2014   BMI 23.04 kg/m?   Height: 5' 7.75" (172.1 cm) ? ?General appearance: alert, cooperative and appears stated age ?Head: Normocephalic, without obvious abnormality, atraumatic ?Neck: no adenopathy, supple, symmetrical, trachea midline and thyroid normal to inspection and palpation ?Lungs: clear to auscultation bilaterally ?Breasts: normal appearance, no masses or tenderness ?Heart: regular rate and rhythm ?Abdomen: soft, non-tender; bowel sounds normal; no masses,  no organomegaly ?Extremities: extremities normal, atraumatic, no cyanosis or edema ?Skin: Skin color, texture, turgor normal. No rashes or lesions ?Lymph nodes: Cervical, supraclavicular, and axillary nodes normal. ?No abnormal inguinal nodes palpated ?Neurologic: Grossly normal ? ? ?Pelvic: External genitalia:  no lesions ?             Urethra:  normal appearing urethra with no masses, tenderness or lesions ?             Bartholins and Skenes: normal    ?             Vagina: normal appearing vagina with normal color and no discharge, no lesions ?             Cervix: no lesions ?             Pap taken: Yes.   ?Bimanual Exam:  Uterus:  normal size, contour, position, consistency, mobility, non-tender ?             Adnexa: normal adnexa and no mass, fullness, tenderness ?              Rectovaginal: Confirms ?              Anus:  normal sphincter tone, no lesions ? ?Chaperone, Octaviano Batty, CMA, was present for exam. ? ?Assessment/Plan: ?1. Well woman exam with routine gynecological exam ?- Pap smear obtained today with HR HPV ?- Mammogram 10/2019.  Aware this is due. ?- Colonoscopy 09/2013.  Follow up 10 years ?-  Bone mineral density will be planned around age 48 ?- lab work done done with [name].  Hormone testing will be obtained today. ?- vaccines reviewed/updated ? ?2. Hormone replacement therapy (  HRT) ?- Estradiol ?- Testosterone, Total, LC/MS/MS ?- Progesterone ? ?3. HSV (herpes simplex virus) infection ?- valACYclovir (VALTREX) 500 MG tablet; Take 1 tablet (500 mg total) by mouth daily.  Dispense: 90 tablet; Refill: 4 ? ?4. Encounter for screening mammogram for malignant neoplasm of breast ?- order placed for pt to have this done ?- MM 3D SCREEN BREAST BILATERAL; Future ? ?5. History of substance abuse ([place]) ?- in remission  ? ?

## 2021-09-22 NOTE — Patient Instructions (Addendum)
[facility] ? ?Jill Humble, MD ?Jill Friday, MD ? ?OFFICE LOCATION: ?[address] ?[place], [place] [address] ? ?[phone] ? ?[place] ?

## 2021-09-24 DIAGNOSIS — H6123 Impacted cerumen, bilateral: Secondary | ICD-10-CM | POA: Diagnosis not present

## 2021-09-24 LAB — CYTOLOGY - PAP
Comment: NEGATIVE
Diagnosis: NEGATIVE
High risk HPV: NEGATIVE

## 2021-09-25 DIAGNOSIS — D485 Neoplasm of uncertain behavior of skin: Secondary | ICD-10-CM | POA: Diagnosis not present

## 2021-09-25 DIAGNOSIS — C44719 Basal cell carcinoma of skin of left lower limb, including hip: Secondary | ICD-10-CM | POA: Diagnosis not present

## 2021-09-25 DIAGNOSIS — L565 Disseminated superficial actinic porokeratosis (DSAP): Secondary | ICD-10-CM | POA: Diagnosis not present

## 2021-09-25 DIAGNOSIS — C44519 Basal cell carcinoma of skin of other part of trunk: Secondary | ICD-10-CM | POA: Diagnosis not present

## 2021-09-25 LAB — TESTOSTERONE, TOTAL, LC/MS/MS: Testosterone, total: 12.3 ng/dL

## 2021-09-25 LAB — ESTRADIOL: Estradiol: 27.6 pg/mL

## 2021-09-25 LAB — PROGESTERONE: Progesterone: 0.5 ng/mL

## 2021-09-26 ENCOUNTER — Encounter (HOSPITAL_BASED_OUTPATIENT_CLINIC_OR_DEPARTMENT_OTHER): Payer: Self-pay | Admitting: Obstetrics & Gynecology

## 2021-10-06 ENCOUNTER — Other Ambulatory Visit (HOSPITAL_BASED_OUTPATIENT_CLINIC_OR_DEPARTMENT_OTHER): Payer: Self-pay | Admitting: Obstetrics & Gynecology

## 2021-10-06 DIAGNOSIS — Z79890 Hormone replacement therapy (postmenopausal): Secondary | ICD-10-CM

## 2021-10-06 DIAGNOSIS — M549 Dorsalgia, unspecified: Secondary | ICD-10-CM | POA: Diagnosis not present

## 2021-10-06 DIAGNOSIS — M6281 Muscle weakness (generalized): Secondary | ICD-10-CM | POA: Diagnosis not present

## 2021-10-06 DIAGNOSIS — R262 Difficulty in walking, not elsewhere classified: Secondary | ICD-10-CM | POA: Diagnosis not present

## 2021-10-06 DIAGNOSIS — S3421XD Injury of nerve root of lumbar spine, subsequent encounter: Secondary | ICD-10-CM | POA: Diagnosis not present

## 2021-10-06 MED ORDER — EST ESTROGENS-METHYLTEST 0.625-1.25 MG PO TABS: 1.0000 | ORAL_TABLET | Freq: Every day | ORAL | 5 refills | Status: DC

## 2021-10-08 DIAGNOSIS — M9903 Segmental and somatic dysfunction of lumbar region: Secondary | ICD-10-CM | POA: Diagnosis not present

## 2021-10-08 DIAGNOSIS — M9902 Segmental and somatic dysfunction of thoracic region: Secondary | ICD-10-CM | POA: Diagnosis not present

## 2021-10-08 DIAGNOSIS — M150 Primary generalized (osteo)arthritis: Secondary | ICD-10-CM | POA: Diagnosis not present

## 2021-10-08 DIAGNOSIS — M9904 Segmental and somatic dysfunction of sacral region: Secondary | ICD-10-CM | POA: Diagnosis not present

## 2021-10-22 DIAGNOSIS — Z4889 Encounter for other specified surgical aftercare: Secondary | ICD-10-CM | POA: Diagnosis not present

## 2021-10-23 ENCOUNTER — Encounter: Payer: Self-pay | Admitting: Family Medicine

## 2021-10-23 ENCOUNTER — Encounter (HOSPITAL_BASED_OUTPATIENT_CLINIC_OR_DEPARTMENT_OTHER): Payer: Self-pay | Admitting: Obstetrics & Gynecology

## 2021-10-28 ENCOUNTER — Encounter: Payer: Self-pay | Admitting: Family Medicine

## 2021-10-28 ENCOUNTER — Encounter (HOSPITAL_BASED_OUTPATIENT_CLINIC_OR_DEPARTMENT_OTHER): Payer: Self-pay | Admitting: Obstetrics & Gynecology

## 2021-10-28 NOTE — Telephone Encounter (Signed)
To answer her question, yes we can still do virtual visits until she finds a new PCP  ?

## 2021-10-31 ENCOUNTER — Other Ambulatory Visit (HOSPITAL_BASED_OUTPATIENT_CLINIC_OR_DEPARTMENT_OTHER): Payer: Self-pay | Admitting: Obstetrics & Gynecology

## 2021-10-31 ENCOUNTER — Encounter (HOSPITAL_BASED_OUTPATIENT_CLINIC_OR_DEPARTMENT_OTHER): Payer: Self-pay | Admitting: Obstetrics & Gynecology

## 2021-10-31 DIAGNOSIS — Z79890 Hormone replacement therapy (postmenopausal): Secondary | ICD-10-CM

## 2021-10-31 MED ORDER — EST ESTROGENS-METHYLTEST 0.625-1.25 MG PO TABS: 1.0000 | ORAL_TABLET | Freq: Every day | ORAL | 1 refills | Status: DC

## 2021-11-03 ENCOUNTER — Encounter (HOSPITAL_BASED_OUTPATIENT_CLINIC_OR_DEPARTMENT_OTHER): Payer: Self-pay | Admitting: Obstetrics & Gynecology

## 2021-11-04 ENCOUNTER — Other Ambulatory Visit (HOSPITAL_BASED_OUTPATIENT_CLINIC_OR_DEPARTMENT_OTHER): Payer: Self-pay | Admitting: Obstetrics & Gynecology

## 2021-11-04 DIAGNOSIS — Z79890 Hormone replacement therapy (postmenopausal): Secondary | ICD-10-CM

## 2021-11-04 MED ORDER — EST ESTROGENS-METHYLTEST 0.625-1.25 MG PO TABS: 1.0000 | ORAL_TABLET | Freq: Every day | ORAL | 1 refills | Status: DC

## 2021-11-08 ENCOUNTER — Other Ambulatory Visit: Payer: Self-pay | Admitting: Family Medicine

## 2021-11-08 DIAGNOSIS — M545 Low back pain, unspecified: Secondary | ICD-10-CM

## 2021-11-11 ENCOUNTER — Other Ambulatory Visit: Payer: Self-pay

## 2021-11-11 DIAGNOSIS — M545 Low back pain, unspecified: Secondary | ICD-10-CM

## 2021-11-11 MED ORDER — DICLOFENAC SODIUM 75 MG PO TBEC: DELAYED_RELEASE_TABLET | ORAL | 2 refills | Status: DC

## 2021-11-20 ENCOUNTER — Other Ambulatory Visit: Payer: Self-pay

## 2021-11-20 DIAGNOSIS — M545 Low back pain, unspecified: Secondary | ICD-10-CM

## 2021-11-20 MED ORDER — DICLOFENAC SODIUM 75 MG PO TBEC: DELAYED_RELEASE_TABLET | ORAL | 2 refills | Status: DC

## 2021-12-09 DIAGNOSIS — F411 Generalized anxiety disorder: Secondary | ICD-10-CM | POA: Diagnosis not present

## 2021-12-09 DIAGNOSIS — F3181 Bipolar II disorder: Secondary | ICD-10-CM | POA: Diagnosis not present

## 2021-12-09 DIAGNOSIS — F1410 Cocaine abuse, uncomplicated: Secondary | ICD-10-CM | POA: Diagnosis not present

## 2021-12-12 ENCOUNTER — Other Ambulatory Visit: Payer: Self-pay

## 2021-12-12 ENCOUNTER — Encounter: Payer: Self-pay | Admitting: Family Medicine

## 2021-12-12 DIAGNOSIS — M545 Low back pain, unspecified: Secondary | ICD-10-CM

## 2021-12-12 MED ORDER — MELOXICAM 15 MG PO TABS: 15.0000 mg | ORAL_TABLET | Freq: Every day | ORAL | 5 refills | Status: AC

## 2022-01-19 ENCOUNTER — Encounter: Payer: Self-pay | Admitting: Family Medicine

## 2022-01-19 ENCOUNTER — Other Ambulatory Visit (HOSPITAL_BASED_OUTPATIENT_CLINIC_OR_DEPARTMENT_OTHER): Payer: Self-pay | Admitting: *Deleted

## 2022-01-19 ENCOUNTER — Encounter (HOSPITAL_BASED_OUTPATIENT_CLINIC_OR_DEPARTMENT_OTHER): Payer: Self-pay | Admitting: Obstetrics & Gynecology

## 2022-01-19 MED ORDER — PROGESTERONE 200 MG PO CAPS: 200.0000 mg | ORAL_CAPSULE | Freq: Every day | ORAL | 2 refills | Status: AC

## 2022-01-19 NOTE — Telephone Encounter (Signed)
Call in Aciphex 20 mg daily, #90 with one rf

## 2022-01-19 NOTE — Telephone Encounter (Signed)
Yes that is good also (the old trade name is Aciphex)

## 2022-01-20 ENCOUNTER — Other Ambulatory Visit: Payer: Self-pay

## 2022-01-20 MED ORDER — RABEPRAZOLE SODIUM 20 MG PO TBEC: 20.0000 mg | DELAYED_RELEASE_TABLET | Freq: Every day | ORAL | 1 refills | Status: AC

## 2022-01-29 ENCOUNTER — Encounter: Payer: Self-pay | Admitting: Gastroenterology

## 2022-02-04 DIAGNOSIS — F3181 Bipolar II disorder: Secondary | ICD-10-CM | POA: Diagnosis not present

## 2022-02-04 DIAGNOSIS — F1410 Cocaine abuse, uncomplicated: Secondary | ICD-10-CM | POA: Diagnosis not present

## 2022-02-04 DIAGNOSIS — F411 Generalized anxiety disorder: Secondary | ICD-10-CM | POA: Diagnosis not present

## 2022-02-05 ENCOUNTER — Other Ambulatory Visit: Payer: Self-pay

## 2022-02-05 ENCOUNTER — Other Ambulatory Visit: Payer: Self-pay | Admitting: Orthopedic Surgery

## 2022-02-05 ENCOUNTER — Telehealth: Payer: Self-pay | Admitting: Orthopaedic Surgery

## 2022-02-05 ENCOUNTER — Ambulatory Visit: Payer: BLUE CROSS/BLUE SHIELD | Admitting: Orthopaedic Surgery

## 2022-02-05 ENCOUNTER — Encounter: Payer: Self-pay | Admitting: Orthopaedic Surgery

## 2022-02-05 VITALS — BP 133/65 | HR 66 | Ht 68.0 in | Wt 155.0 lb

## 2022-02-05 DIAGNOSIS — M5416 Radiculopathy, lumbar region: Secondary | ICD-10-CM

## 2022-02-05 MED ORDER — METHYLPREDNISOLONE 4 MG PO TBPK *A*
ORAL_TABLET | ORAL | 0 refills | Status: DC
Start: 2022-02-05 — End: 2022-02-05

## 2022-02-05 MED ORDER — METHYLPREDNISOLONE 4 MG PO TBPK *A*
ORAL_TABLET | ORAL | 0 refills | Status: DC
Start: 2022-02-05 — End: 2022-02-26

## 2022-02-05 MED ORDER — GABAPENTIN 300 MG PO CAPSULE *I*
300.0000 mg | ORAL_CAPSULE | Freq: Three times a day (TID) | ORAL | 1 refills | Status: DC
Start: 2022-02-05 — End: 2022-02-05

## 2022-02-05 MED ORDER — GABAPENTIN 300 MG PO CAPSULE *I*
300.0000 mg | ORAL_CAPSULE | Freq: Three times a day (TID) | ORAL | 1 refills | Status: DC
Start: 2022-02-05 — End: 2022-03-05

## 2022-02-05 NOTE — Progress Notes (Signed)
History of Present Illness    61 y.o. female seen in consultation for radiating left leg pain.  She is recently relocated to the [place] area from [place] and is self-referred.  She underwent left L4-5 hemilaminectomy in March 2023 for a left lumbar radiculopathy.  Following the surgery she experienced about 50% pain relief in the left leg.  She still noticed some radiating leg pain but she was able to walk and be more active.  Then earlier this month on 01/23/2022 she noticed an acute exacerbation of left leg pain after paddle boarding.  The pain begins in the left side of her lower back and radiates to the buttocks, posterior thigh and into the calf.  She called her previous Careers adviser in [place] and he called in anti-inflammatory medications and Lyrica.  She has been using ice and heat as well and has not noticed any significant improvement in her symptoms.  She is now using a cane to walk.  She is unable to stand upright because of the left leg pain.  Following her initial surgery, she denied any issues with wound healing.      Non-operative care to date includes activity modification, diclofenac, Lyrica, steroid taper which did provide a little bit of relief for a few days    I reviewed allergies, medications, PMH, PSH, FH, SH, and Problem list at this encounter and documented  in eRecord.    Pertinent history notable for previous L4-5 decompression in March 2023      REVIEW OF SYSTEMS:   Noncontributory for head, eyes, ear, nose, and throat, cardiac, pulmonary, gastrointestinal, genitourinary, integumentary, psychiatric, endocrine, neurologic and musculoskeletal systems.       PHYSICAL EXAMINATION:   Vitals:    02/05/22 1116   BP: 133/65   Pulse: 66   Weight: 70.3 kg (155 lb)   Height: 1.727 m (5\' 8" )       Patient is well-nourished, well-appearing and in no distress. Sagittal alignment is within normal limits.  They ambulate with an antalgic gait favoring the left side.  She brought her cane to the  office today but is able to ambulate short distances in the exam room independently.  They are able to heel and toe walk with good strength and coordination.    No tenderness to palpation in the midline or lumbar paraspinal muscle groups.   No pain with range of motion of the lumbar spine. Hip range of motion does not exacerbate symptoms.   5/5 strength in bilateral iliopsoas, quads, tibialis anterior, EHL and gastroc muscle groups.   Sensation is intact to light touch in the L3-S1 nerve distributions bilaterally.   Positive straight leg raise on the left at 45 degrees in the seated position.   Symmetric patellar and Achilles reflexes.  Extremities are warm and well-perfused without evidence of peripheral edema.      Imaging:    None    Other pertinent studies:   None    Medical Decisions/Impression/Plan:  Diagnosis:   Acute on chronic left lumbar radiculopathy    Key Discussion:  All findings were reviewed with the patient and her sister in the office today.  She has an acute exacerbation of chronic left lumbar radiculopathy after previous L4-5 decompression surgery in March 2023.  I would like to try a second and final steroid taper, and start her on gabapentin.  Once the steroids are finished, she can resume taking diclofenac.  I encouraged her to take these medications more consistently to try  to alleviate the leg pain.  Given her prior surgical history I like to order an updated lumbar spine MRI with contrast to evaluate for residual stenosis or possibly an acute disc herniation.    Treatment Options and tests ordered:  Lumbar spine MRI with and without contrast      Follow-up: After MRI to review findings and determine next best steps in management    Orders for Next Visit: None

## 2022-02-05 NOTE — Telephone Encounter (Signed)
[facility] called because the two prescriptions for this patient went to them as a fax but did not have your signature on them.  Are you able to resend them with an e-signature?    Thanks

## 2022-02-08 ENCOUNTER — Encounter: Payer: Self-pay | Admitting: Family Medicine

## 2022-02-08 ENCOUNTER — Encounter (HOSPITAL_BASED_OUTPATIENT_CLINIC_OR_DEPARTMENT_OTHER): Payer: Self-pay | Admitting: Obstetrics & Gynecology

## 2022-02-09 ENCOUNTER — Other Ambulatory Visit (HOSPITAL_BASED_OUTPATIENT_CLINIC_OR_DEPARTMENT_OTHER): Payer: Self-pay | Admitting: Obstetrics & Gynecology

## 2022-02-09 DIAGNOSIS — B009 Herpesviral infection, unspecified: Secondary | ICD-10-CM

## 2022-02-09 DIAGNOSIS — Z79890 Hormone replacement therapy (postmenopausal): Secondary | ICD-10-CM

## 2022-02-09 MED ORDER — EST ESTROGENS-METHYLTEST 0.625-1.25 MG PO TABS: 1.0000 | ORAL_TABLET | Freq: Every day | ORAL | 0 refills | Status: AC

## 2022-02-09 MED ORDER — VALACYCLOVIR HCL 500 MG PO TABS: 500.0000 mg | ORAL_TABLET | Freq: Every day | ORAL | 4 refills | Status: AC

## 2022-02-09 NOTE — Telephone Encounter (Signed)
Called pt to let her know that Dr. Sabra Heck send refills on the requested medications. Advised that since the estratest contains testosterone and this is a scheduled 2 controlled substance with the FDA,   [place] has more stringent rules for this medication than [place]. Advised that Dr. Sabra Heck thinks she was able to do a 30 day supply with 2 refills.  Advised that further refills will have to be obtained with a [facility] provider. Pt states that she is scheduled with one in October.

## 2022-02-10 ENCOUNTER — Ambulatory Visit: Payer: BLUE CROSS/BLUE SHIELD | Attending: Orthopaedic Surgery

## 2022-02-10 ENCOUNTER — Other Ambulatory Visit: Payer: Self-pay

## 2022-02-10 ENCOUNTER — Telehealth: Payer: Self-pay | Admitting: Orthopaedic Surgery

## 2022-02-10 DIAGNOSIS — M5432 Sciatica, left side: Secondary | ICD-10-CM | POA: Insufficient documentation

## 2022-02-10 DIAGNOSIS — M5459 Other low back pain: Secondary | ICD-10-CM | POA: Insufficient documentation

## 2022-02-10 DIAGNOSIS — M5431 Sciatica, right side: Secondary | ICD-10-CM | POA: Insufficient documentation

## 2022-02-10 NOTE — Progress Notes (Signed)
Physical Therapy Daily Flowsheet:  *Please see Physical Therapy Exercise Flowsheet for details regarding exercises completed this session.*     02/10/22 1600   Overview   Diagnosis Acute mechanical low back pain with bilateral sciatica   Insurance Other  ([employer] BC/BS of [place])   Script Date 02/05/22   Visit # 1   Additional Comments HEP: bridges, pelvic tilt with marching, seated hamstring stretch. 8 week POC.   Pain Assessment   Pain X    0-10 Scale 5  (5/10 at rest, 10/10 with movement)   Functional Outcome Measures   Low Back Pain and Disability Index (Revised Oswestry) Yes   Pain Intensity 5   Personal Care 3   Lifting 5   Walking 5   Sitting 3   Standing 2   Sleeping 3   Social Life 4   Driving/Riding in car, etc 4   Changing degree of pain 5   Low back pain and disability index score 78   Patient Education   Patient Education Yes   Educated in disease process Yes   Educated in home exercise program Yes   Additional Patient Education HEP: bridges, pelvic tilt with marching, seated hamstring stretch.   Time Calculation   PT Timed Codes 8   PT Untimed Codes 20   PT Unbilled Time 0   PT Total Treatment 28     Guinevere Scarlet, Student PT

## 2022-02-10 NOTE — Progress Notes (Signed)
Physical Therapy Exercise Flowsheet:  *Please refer to Physical Therapy Daily Flowsheet for further details of this session.*     02/10/22 1700   Lumbar/Abdominal Exercises   Bridges Comment HEP: 3x10, 3", patient tolerated well, mild pain.   [place] Level 1A Comment HEP: 3x10 marches each side. Patient tolerated well   Total time 8 (ther-ex)   Hip Exercises   Hamstring Stretch, Seated HEP: 1x10, 10". Bilaterally, patient tolerated well. Needed decreased dorsiflexion on R side.     Guinevere Scarlet, Student PT

## 2022-02-10 NOTE — Progress Notes (Signed)
Department of Physical Medicine & Rehabilitation  Physical Therapy Initial Assessment    History/Subjective    Diagnosis: Acute mechanical low back pain with bilateral sciatica     Referring practitioner: Evelena Asa, MD    Onset date of symptoms:  01/23/2022    Subjective: Gail Jensen is a 61 y.o. female presenting to therapy with a chief complaint of acute mechanical low back pain that radiates down the posterior aspect of both LEs. She had a L4-L5 hemilaminectomy in March 2023 for L lumbar radiculopathy. She expresses that initially she was having symptoms mostly in the L lumbar spine down the L leg, but recently has been experiencing increased pain and radiating symptoms on the R side. She had some pain relief after surgery and attended physical therapy for 2 sessions, but has not been back since. She tends to manage her pain with heat, TENS, and ice, which gives her the most relief. She has recently had an exacerbation after paddle boarding earlier this month. She expresses that she has since had difficulty with housework, sleeping, personal care, ascending and descending stairs, and walking her dog secondary to pain. She lives alone in a 2-story house and gets assistance with her daily activities from her sister. She expresses that after walking for about an hour and a half, she begins to experience increased pain and numbness down her legs.     Mechanism of injury: Onset of increased symptoms after paddleboarding earlier this month.     Work Status: Not working because of pain.     Symptoms Worsen With: Walking for long periods of time >1 hour, bending, sleeping.     Symptoms Better With: Standing, supine, lumbar extension, and less than 1 hr of walking.    No past medical history on file.  No past surgical history on file.    Comorbidities affecting treatment/recovery:  ? none noted  Personal factors affecting treatment/recovery:  ? Advanced age  ? Unemployed  Clinical presentation:  ? evolving    Patient  complexity:    ? moderate level as indicated by above stability of condition, personal factors, environmental factors and comorbidities in addition to their impairments found on physical exam.    Pain:   Today: 5/10, at rest, 10/10 with any movement    Worst: 10/10      Location: Lumbar spine, bilaterally, describes as sharp pain    Objective    Observation: Patient is a pleasant and cooperative 61 y.o. female NAD.    Gait: Decreased stance time on the L LE, antalgic gait pattern, decreased lumbar rotation with ambulation.    Red Flags:  No bladder/bowel dysfunction, focal motor loss, saddle anaesthesia    ROM Lumbar Spine %   Flexion Mod limitation **, sciatica symptoms   Extension Mod limitation *, no sciatica symptoms, some relief once extended more   Right Sidebending Min limitation, pain returning to starting position   Left Sidebending Min limitation, pain returning to starting position   Right Rotation WFL, pain returning to starting position   Left Rotation WFL   *indicates pain    Strength:      LE Muscle Right  Left    HIP     Flexion 5  5   Abduction 2 4-    Internal Rotation 5 5   External Rotation 4-** 4+   KNEE     Extension 5  5   Flexion  4- 5   ANKLE     Dorsiflexion  5 5   *  indicates pain       SPECIAL TEST Lumbar Spine + / -   SLR + L side    SLR with DF + L side   + reproduction of symptoms    Oswestry Disability Index: 78%    Assessment:     Gail Jensen is a 61 y.o.female presenting to therapy with signs and symptoms consistent with acute mechanical low back pain with bilateral sciatica. Patient has impairments including increased pain with radicular symptoms, decreased ROM, decreased strength bilaterally, and an antalgic gait pattern which prevent her ability to stand and walk for longer periods of time, sleep through the night, as well as complete daily tasks, such as household work and walking her dog. Skilled therapy services are indicated in order to address her impairments in order to maximize  function and address the goals below.       Rehab potential/prognosis: good  Patient's understanding: good    Patient's Goal:  To return to her normal daily activities.     Plan  Plan of Care: Appropriate for PT    PT interventions: AROM/PROM/Therapeutic exercise, Balance activites, Closed chain activites, Cold, Flexibility, Gait training/Functional activities, General conditioning, Heat, Home exercise program instruction, Joint mobilizations, Lumbar traction, mechanical, Manual therapy, McKenzie exercises, Myofacial release, Neuromuscular Re-education, Patient/Family Education, Postural training/body Curator education, Strengthening, Therapeutic Activities    PT frequency:  Once a week    PT duration: 8 weeks      Short term goals (4 weeks):  1. Initiate HEP  2. Patient will improve Oswestry Disability Index score to 50% in order to complete daily tasks with decreased pain and to demonstrate decreased disability.   3. Patient will report being able to ambulate for >=1.5 hours without increased reports of pain.   4. Patient will report being able to complete household tasks for >=30 minutes without increased level of pain.     Long term goals (8 weeks):  1. Independent with final HEP  2. Patient will improve Oswestry Disability Index score to 25% in order to complete daily tasks with decreased pain and to demonstrate decreased disability.  3. Patient will report being able to ambulate for >=3 hours without increased reports of pain.   4. Patient will report being able to complete household tasks for >=1 hour without increased level of pain.       Thank you for the referral.  If you have any questions and/or concerns, please feel free to contact me at [phone].      Guinevere Scarlet, Student PT  Andee Poles, PT, DPT

## 2022-02-10 NOTE — Telephone Encounter (Signed)
Patient called stated you had asked her if she was having any nerve pain, she wanted to let you know she is in her right leg everyday. If need to contact Gail Jensen she can be reached at [phone]. Thank you.

## 2022-02-12 ENCOUNTER — Encounter: Payer: Self-pay | Admitting: Orthopedic Surgery

## 2022-02-19 ENCOUNTER — Ambulatory Visit
Admission: RE | Admit: 2022-02-19 | Discharge: 2022-02-19 | Disposition: A | Discharge status: Home / Self Care | Payer: BLUE CROSS/BLUE SHIELD | Source: Ambulatory Visit | Attending: Orthopaedic Surgery | Admitting: Orthopaedic Surgery

## 2022-02-19 ENCOUNTER — Other Ambulatory Visit: Payer: Self-pay

## 2022-02-19 DIAGNOSIS — M5416 Radiculopathy, lumbar region: Secondary | ICD-10-CM

## 2022-02-19 DIAGNOSIS — M4726 Other spondylosis with radiculopathy, lumbar region: Secondary | ICD-10-CM | POA: Insufficient documentation

## 2022-02-19 DIAGNOSIS — M48061 Spinal stenosis, lumbar region without neurogenic claudication: Secondary | ICD-10-CM

## 2022-02-19 MED ORDER — GADOTERIDOL 279.3 MG/ML (PROHANCE) IV SOLN *I*
18.0000 mL | Freq: Once | INTRAVENOUS | Status: AC
Start: 2022-02-19 — End: 2022-02-19
  Administered 2022-02-19: 18 mL via INTRAVENOUS

## 2022-02-24 ENCOUNTER — Other Ambulatory Visit: Payer: Self-pay

## 2022-02-24 ENCOUNTER — Ambulatory Visit: Payer: BLUE CROSS/BLUE SHIELD | Attending: Orthopaedic Surgery

## 2022-02-24 DIAGNOSIS — M5432 Sciatica, left side: Secondary | ICD-10-CM | POA: Insufficient documentation

## 2022-02-24 DIAGNOSIS — M5431 Sciatica, right side: Secondary | ICD-10-CM | POA: Insufficient documentation

## 2022-02-24 DIAGNOSIS — M5459 Other low back pain: Secondary | ICD-10-CM | POA: Insufficient documentation

## 2022-02-24 NOTE — Progress Notes (Signed)
Physical Therapy Exercise Flowsheet:  *Please refer to Physical Therapy Daily Flowsheet for further details of this session.*     02/24/22 1400   Lumbar/Abdominal Exercises   Lumbar Rotation Stretch Comment 2x10, 5" bilaterally. Patient tolerated well without increase in pain. Added to HEP.   Single Knee To Chest Comment 20"x5 bilaterally. patient tolerated well with no increases in pain. Increased stretch on the L LE but no reports of pain.   Hip Exercises   Hamstring Stretch, Seated supine with strap, 5x20", bilaterally. patient tolerated well. Added to HEP.   Total time 23 (ther-ex)       Gail Jensen, Student PT

## 2022-02-24 NOTE — Progress Notes (Signed)
Physical Therapy Daily Flowsheet:  *Please see Physical Therapy Exercise Flowsheet for details regarding exercises completed this session.*     02/24/22 1400   Overview   Diagnosis Acute mechanical low back pain with bilateral sciatica   Insurance Other   Script Date 02/05/22   Visit # 2   Additional Comments [name] reports she has been doing well with the exercises at home, but has had an increase in her pain with bridges and seated hamstring stretch the last few days. She reports she had an MRI done and will see an orthopedic surgeon on Thursday to discuss the results. Assessment: Patient tolerated session well today. She was able to add more lumbar mobility exercises and stretching activities without increases in pain. Plan: Continue exercises that do not induce pain, initiate light strenghtening activities.   Pain Assessment   Pain X    0-10 Scale 7   Patient Education   Patient Education Yes   Educated in disease process Yes   Educated in home exercise program Yes   Additional Patient Education HEP: bridges, pelvic tilt with marching, seated hamstring stretch, supine hamstring stretch, lumbar rotation stretch, single knee to chest.   Time Calculation   PT Timed Codes 23   PT Untimed Codes 0   PT Unbilled Time 0   PT Total Treatment 23     Guinevere Scarlet, Student PT

## 2022-02-25 ENCOUNTER — Ambulatory Visit: Payer: Self-pay | Admitting: Orthopaedic Surgery

## 2022-02-26 ENCOUNTER — Other Ambulatory Visit: Payer: Self-pay

## 2022-02-26 ENCOUNTER — Other Ambulatory Visit: Payer: Self-pay | Admitting: Radiology

## 2022-02-26 ENCOUNTER — Encounter: Payer: Self-pay | Admitting: Orthopaedic Surgery

## 2022-02-26 ENCOUNTER — Ambulatory Visit: Payer: BLUE CROSS/BLUE SHIELD | Admitting: Orthopaedic Surgery

## 2022-02-26 VITALS — BP 134/64 | HR 74 | Ht 68.0 in | Wt 153.8 lb

## 2022-02-26 DIAGNOSIS — Z01818 Encounter for other preprocedural examination: Secondary | ICD-10-CM

## 2022-02-26 DIAGNOSIS — M5416 Radiculopathy, lumbar region: Secondary | ICD-10-CM | POA: Insufficient documentation

## 2022-02-26 NOTE — Progress Notes (Signed)
.  History of Present Illness    61 y.o. female here to follow up her recent MRI results.  She reports minimal relief with the Medrol Dosepak and gabapentin that we prescribed at her last visit.  She still has a lot of left leg pain when she is standing.  She is unable to stand for more than a few minutes before the left leg pain forces her to sit down.  At rest she has a little bit of pain in the left hamstring region but this is tolerable.  She experiences shooting left leg pain from the glutes all the way down to her foot when she is sleeping which wakes her up.  Prior to her first surgery back in [place] she underwent an epidural steroid injection which did not provide any relief of her leg pain.  She is currently in a course of outpatient physical therapy and is doing her home exercises.    Non-operative care to date includes activity modification, diclofenac, Lyrica, steroid taperx2 which did provide a little bit of relief for a few days, gabapentin, physical therapy    I reviewed allergies, medications, PMH, PSH, FH, SH, and Problem list at this encounter and documented  in eRecord.    Pertinent history notable for previous L4-5 decompression in March 2023      REVIEW OF SYSTEMS:   Noncontributory for head, eyes, ear, nose, and throat, cardiac, pulmonary, gastrointestinal, genitourinary, integumentary, psychiatric, endocrine, neurologic and musculoskeletal systems.       PHYSICAL EXAMINATION:   Vitals:    02/26/22 0900   BP: 134/64   Pulse: 74   Weight: 69.8 kg (153 lb 12.8 oz)   Height: 1.727 m (5\' 8" )       Patient is well-nourished, well-appearing and in no distress. Sagittal alignment is within normal limits.  They ambulate with an antalgic gait favoring the left side.  She brought her cane to the office today but is able to ambulate short distances in the exam room independently.  They are able to heel and toe walk with good strength and coordination.    No tenderness to palpation in the midline  or lumbar paraspinal muscle groups.   No pain with range of motion of the lumbar spine. Hip range of motion does not exacerbate symptoms.   5/5 strength in bilateral iliopsoas, quads, tibialis anterior, EHL and gastroc muscle groups.   Sensation is intact to light touch in the L3-S1 nerve distributions bilaterally.   Positive straight leg raise on the left at 45 degrees in the seated position.   Symmetric patellar and Achilles reflexes.  Extremities are warm and well-perfused without evidence of peripheral edema.      Imaging:    Lumbar spine MRI from August 2023 was reviewed.  I also reviewed the radiologist report.  Overall alignment is within normal limits.  Grade 1 anterolisthesis at L4-5.  Evidence of prior left L4-5 hemilaminectomy with persistent severe bilateral lateral recess stenosis and moderate to severe central stenosis.  There is advanced facet arthropathy at L4-5.  Remaining levels show mild spondylotic changes without any evidence of focal neurologic impingement.    Other pertinent studies:   None    Medical Decisions/Impression/Plan:  Diagnosis:   Acute on chronic left lumbar radiculopathy, L4-5 stenosis with degenerative spondylolisthesis    Key Discussion:  All findings were reviewed with the patient and her sister in the office today.  She has persistent left leg pain now for more than 2 months  despite multimodal conservative management.  She underwent a left L4-5 hemilaminectomy for the same issue earlier this year which only gave her about 50% pain relief until this recent flareup of left leg pain over the summer.  She is interested in operative intervention and I think she is an excellent candidate for L4-5 TLIF surgery. The risks, benefits and goals of the procedure as well as the expected recovery time were discussed with the patient in detail. Risks include, but are not limited to bleeding, infection, nerve injury, permanent numbness or weakness, persistent pain, CSF leak/dural tear,  hardware failure or malpositioning, nonunion, adjacent level degeneration, need for further surgery, and medical/anesthesia complications. Complications were described in layperson terminology.  I emphasized that since this is a revision surgery there is a higher risk of spinal fluid leak.  Patient has a good understanding and reasonable expectations with regard to expected surgical outcomes, goals of surgery, and recovery time. Patient wishes to proceed with surgery and will reach out to schedule.    Postoperative limitations include no lifting >20 pounds, high impact sports, or extreme bending/twisting for 12 weeks. They should keep the incision clean and dry until first follow-up visit approximately 2-3 weeks after surgery.    Evelena Asa, MD

## 2022-02-26 NOTE — Patient Instructions (Signed)
What to expect:  Every patient's post-operative course is unique. In general, after your surgery you will likely spend 1-2 nights in the hospital. You will be evaluated by physical therapy in order to determine your needs after surgery based on home environment, support system, and physical condition.   Leg pain, sensory changes, and muscle weakness can take weeks to months to improve after surgery. In some situations changes are faster, but do not be discouraged if your symptoms are unchanged immediately after surgery.     We encourage all patients to get up and ambulate on the same day as surgery, unless specifically instructed to stay in bed. The quicker you get moving, the less painful it will be the following day and faster you will be able to be discharged from the hospital.    Follow-up:   You will have your first follow-up appointment with Dr. Kohan Azizi 2-3 weeks from the date of your surgery. The date, time and location will be in your discharge paperwork when you leave the hospital. Please call [phone] with any questions regarding your appointment.     Activity:  We encourage you to get up and walk for 10-15 minutes every 1-2 hours during the day during the first 2-3 weeks after surgery. Work on increasing your walking time or distance in manageable increments each day. You may need a walker or a cane for support right after surgery. You can wean off these devices as you feel comfortable.    Activities to avoid:  No extreme bending or twisting movements and no lifting over 8 pounds (about a gallon of milk). Avoid high impact athletics or activities (running, jumping, contact sports). We will discuss advancing activity at your follow-up appointment.  Home PT and nursing services will be set up for you based on your needs while in the hospital  We will discuss starting outpatient physical therapy during your postoperative visits, not everyone needs to go to outpatient PT but many patients will benefit  starting around 6-8 weeks after surgery.    Return to work:  Patients that can work from home or have a short commute and perform mainly desk work will often return to work in 1-2 weeks.  Patients that perform manual labor, or work that requires a lot of driving, repetitive overhead activity, or of heavy lifting can plan on going back to full duty around 8-12 weeks after surgery, depending on recovery timeline and specifics of their job.     Driving:  You are permitted to drive once you are off all opioid pain medication (oxycodone, etc.) and feel comfortable behind the wheel. Many patients feel comfortable driving around the time of our first postop visit.    Wound Care:  Your incision is covered with dry gauze and a clear adhesive bandage.   Some bleeding after surgery is normal/expected and do not be alarmed if a small amount soaks through the dressing  If the dressing appears clean and dry, keep it in place until post-operative day 5.   If the dressing appears saturated, remove the dressing and apply a dry sterile dressing (gauze and tegaderm bandage). Monitor for continued wound drainage.   Your dressing is waterproof, and you may shower. However, allow the water to hit the front of your body and run gently over the dressing to prevent your incision site from getting wet.   You may have a Prevena wound vacuum over your incision. Please leave it on until the date specified in your discharge instructions,   usually 5-7 days after it was applied.   The vacuum automatically suctions at 125 mm/hg. You do not need to adjust this.   Make sure the machine is plugged into a power supply or outlet at all times while not ambulating.   It is ok to get the dressing wet, but do not get the canister wet.   If the machine is beeping, please call our office.   DO NOT submerge your incision in standing water such as baths, hot tubs, pools, etc. until discussed with Dr. Gisele Pack.  DO NOT remove sutures if present    Pain  Management:  Everyone's experience with postoperative pain is different. In general,  You will receive the following prescriptions post-operatively:  Oxycodone 5-10 mg to be taken by mouth every 4-6 hours as needed for pain. Wean off of this medication first! Only take it if you have severe pain (8-10 on a scale of 1-10).  Colace 100 mg by to be taken by mouth twice daily while taking opioid pain medication to help prevent constipation.   Zanaflex 4-8 mg to be taken by mouth every 8 hours as needed for muscle spasms.   Please call during business hours Monday-Friday for refills. Prescriptions will not be able to be refilled over night or on weekends.   Occasionally, these medications will differ for some patients. We will send you home on the medications that work well for you and controlling your postoperative pain.   You may start taking anti-inflammatory medications such as Aleve, Advil/ibuprofen/Motrin 2 days after your surgery.  Common side effects are nausea, drowsiness and constipation. Consider taking medication with food and use an over-the-counter laxative as needed  DO NOT operate a motor vehicle or heavy machinery while on narcotic medication  DO NOT take other pain medications unless instructed to do so  DO NOT drink alcohol while on pain medication     Other Instructions and Information:  Avoid long periods of sitting, bedrest or travel during the first two weeks after surgery  The effects of anesthesia including drowsiness, fatigue and nausea may take hours to days to completely resolve  DO NOT make important decisions within 24 hours of surgery

## 2022-02-27 ENCOUNTER — Telehealth: Payer: Self-pay

## 2022-02-27 ENCOUNTER — Encounter: Payer: Self-pay | Admitting: Orthopedic Surgery

## 2022-02-27 ENCOUNTER — Encounter: Payer: Self-pay | Admitting: Orthopaedic Surgery

## 2022-02-28 ENCOUNTER — Other Ambulatory Visit: Payer: Self-pay

## 2022-03-02 ENCOUNTER — Other Ambulatory Visit: Payer: Self-pay

## 2022-03-02 ENCOUNTER — Ambulatory Visit: Payer: BLUE CROSS/BLUE SHIELD

## 2022-03-02 DIAGNOSIS — M5431 Sciatica, right side: Secondary | ICD-10-CM

## 2022-03-02 DIAGNOSIS — M5459 Other low back pain: Secondary | ICD-10-CM

## 2022-03-02 NOTE — Progress Notes (Signed)
Physical Therapy Daily Flowsheet:  *Please see Physical Therapy Exercise Flowsheet for details regarding exercises completed this session.*     03/02/22 0900   Overview   Diagnosis Acute mechanical low back pain with bilateral sciatica   Insurance Other   Script Date 02/05/22   Visit # 3   Additional Comments [name] reports she is doing well and indicates she is neither better or worse. She said she is going to proceed with surgery on September 25th for a TLIF and a laminectomy at L4 and L5. Assessment: Patient tolerated session well. She had no increases in pain or peripheral symptoms with stretching exercises, lumbar spine mobility, and gentle strengthening activities today. Plan: Continue gentle stretching and lumbar mobility in pain free ROM.   Pain Assessment   Pain X    0-10 Scale 7   Patient Education   Patient Education Yes   Educated in disease process Yes   Educated in home exercise program Yes   Additional Patient Education HEP: bridges, pelvic tilt with marching, seated hamstring stretch, supine hamstring stretch, lumbar rotation stretch, single knee to chest,  flexion in sitting, clams, seated hamstring stretch, piriformis stretch sitting   Time Calculation   PT Timed Codes 30   PT Untimed Codes 0   PT Unbilled Time 0   PT Total Treatment 30     Guinevere Scarlet, Student PT

## 2022-03-02 NOTE — Progress Notes (Signed)
Physical Therapy Exercise Flowsheet:  *Please refer to Physical Therapy Daily Flowsheet for further details of this session.*     03/02/22 0900   Lumbar/Abdominal Exercises   Clam Shells Comment lvl1, 2x10 bilaterally. Patient tolerated well with no increases in sciatica symptoms.   Additional Exercise flexion in sitting 2x10, 5". Patient tolerated well with no increase in symptoms.   Additional Exercise seated hamstring stretch with foot elevated on stool , 3x30" bilaterally.   additional exercise piriformis stretch in sitting, 3x20" bilaterally. No increase in peripheral symptoms. Patient tolerated well.   Total time 30 (therex)     Guinevere Scarlet, Student PT

## 2022-03-03 NOTE — Discharge Instructions (Addendum)
[facility] Preoperative Instructions            Patient Name: Gail Jensen  Surgery Date:  MONDAY, SEPTEMBER 25        When to Arrive for Surgery         On the day prior to your surgery, Friday, September 22, you will find out your arrival time. [facility] - Please call [phone] between 2:30 and 7:00 p.m. .        Note: Patients scheduled for a procedure on Monday will be assigned an arrival time on the Friday before. Please note surgery start time is approximate. You may want to bring something to help pass the time.        PLEASE ARRIVE ON TIME.        Directions to Surgical Center        [facility]: On the day of your procedure, park in the parking garage and take the elevator/stairs to Level One (1), then follow the walkway to the Main Lobby.  Walk past the Information Desk in the lobby, towards the Lab & Outpatient Services.  Follow the GREEN (R) ceiling tags to the GREEN elevators. (Valet parking is available outside the front entrance of the hospital between 6:00 AM and 5:00 PM and assistance is available at the information desk, if needed).   Continue to the [facility]): Take the GREEN (G) elevators to the Basement (Level B - Two floors down) to the [facility] and check in with the receptionist at the desk.        Eating Guidelines        Follow the instructions below unless otherwise instructed by your physician.        No solid food AFTER MIDNIGHT on the day of your surgery. No candy, gum, mints or chewing tobacco.        You can have clear liquids up to 4 hours before your surgery. This includes water, apple juice,  clear carbonated beverages,  black coffee,  clear tea. No milk, cream, or non-dairy creamers.         Failure to follow these instructions, could lead to a delay or cancellation of your procedure.        Medication Guidelines        On the morning of surgery, take only the medications indicated on the  Preoperative Medication List above.        Medications should only be taken with no more than one ounce of water.        Hold any herbal supplements 5-7 days prior to surgery.  You may take Tylenol (Acetaminophen) if needed.  Aspirin: Do not take any aspirin products for 5 days before the procedure date.   Anti-Inflammatory products: Do not take any non-steroidal anti-inflammatory agents such as Ibuprofen (Advil, Motrin) or Naproxen (Aleve) 5 days before the procedure.  Please bring your CPAP with you on the day of surgery.   Do not take powder supplements or Metamucil on the day of the procedure.        Additional Information    Shower the night before and/or morning of surgery with an Antibacterial Soap (Like Dial soap).  Cleanse your body well with the soap the night before surgery and/or the morning of surgery.      What to bring or wear:        Bring Photo ID and insurance information.  Eye glasses and/or hearing aids:  These may be removed prior to surgery so be prepared to leave them with a trusted family member.        Do NOT wear contact lenses.        Wear comfortable, loose fit clothing.          You must arrange a ride home before coming to surgery.        What NOT to bring or wear:        Before coming to the hospital, remove all makeup (including mascara), jewelry (including wedding band and watch), hair accessories and nail polish from toes and fingers.  Do not bring any valuables (money, wallet, purse, jewelry, or contact lenses.)        Information for After Surgery:        Your family will be directed to a waiting area when you are taken to surgery.        We ask that only one or two family members accompany you on the day of your procedure.        No children under the age of 62 are allowed as visitors in the [facility].        Additional visitor restrictions are possible during influenza season.        Your family will be notified when your surgery is completed and you have  arrived on the patient care unit.        Expected Length of Stay:        SDA23 Hour Admission: You are staying overnight after surgery.  Please leave any luggage in your car until after your procedure.  Your family can bring this into the hospital once you are in your room.        Health standards require that a responsible adult must accompany any patient who has received anesthetics or sedation and is going home the same day. You must arrange a ride home before coming to surgery.        Questions:    Please call the [facility] at [phone] between 8:00 a.m. and 4:00 p.m. Monday through Friday. You were seen today by  Domenick Bookbinder, NP                  Surgical Site Infections FAQs        What is a Surgical Site infection (SSI)?  A surgical site infection is an infection that occurs after surgery In the part of the body where the surgery took place. Most patients who have surgery do not develop an infection. However, Infections develop in about 1 to 3 out of every 100 patients who have surgery.         Some of the common symptoms of a surgical site infection are:    Redness and pain around the area where you had surgery    Drainage of cloudy fluid from your surgical wound    Fever         Can SSIs be treated?   Yes. Most surgical site infections can be treated with antibiotics. The antibiotic given to you depends on the bacteria (germs) causing the Infection. Sometimes patients with SSIs also need another surgery to treat the infection.         What are some of the things that hospitals are doing to prevent SSls?   To prevent SSIs, doctors, nurses, and other healthcare providers:    Clean their hands and  arms up to their elbows with an antiseptic agent just before the surgery.    Clean their hands with soap and water or an alcohol-based hand rub before and after caring for each patient.    May remove some of your hair Immediately before your surgery using electric clippers If the  hair Is in the same area where the procedure will occur. They should not shave you with a razor.    Wear special hair covers, masks, gowns, and gloves during surgery to keep the surgery area clean.    Give you antibiotics before your surgery starts. in most cases, you should get antibiotics within 60 mInutes before the surgery starts and the antibiotics should be stopped within 24 hours after surgery.    Clean the skin at the site of your surgery with a special soap that kills germs.         What can I do to help prevent SSIs?   Before your surgery:    Tell your doctor about other medical problems you may have. Health problems such as allergies, diabetes, and obesity could affect your surgery and your treatment.    Quit smoking. Patients who smoke get more Infections. Talk to your doctor about how you can quit before your surgery.    Do not shave near where you will have surgery. Shaving with a razor can Irritate your skin and make it easier to develop an infection.         At the time of your surgery:    Speak up if someone tries to shave you with a razor before surgery. Ask why you need to be shaved and talk with your surgeon if you have any concerns.    Ask if you will get antibiotics before surgery.         After your surgery:    Make sure that your healthcare providers clean their hands before examining you, either with soap and water or an alcohol-based hand rub.   If you do not see your healthcare providers wash their hands,   please ask them to do so.     Family and friends who visit you should not touch the surgical wound or dressings.    Family and friends should clean their hands with soap and water or an alcohol-based hand rub before and after visiting you. If you do not see them clean their hands, ask them to clean their hands.   What do I need to do when I go home from the hospital?    Before you go home, your doctor or nurse should explain everyt hing you need to know about taking care of your wound.  Make sure you understand how to care for your wound before you leave the hospital.    Always clean your hands before and after caring for your wound.    Before you go home, make sure you know who to contact If you have questions or problems after you get home.    If you have any symptoms of an Infection, such as redness and pain at the surgery site, drainage, or fever, call your doctor immediately.   if you have additional questions, Please ask your doctor or nurse.     Marland Kitchen    PRE-OPERATIVE SKIN   Please shower or bathe with 4% Hibiclens (active ingredient Chlorhexidine gluconate) daily starting *** day(s) prior to surgery and including the morning of surgery prior to arrival to hospital.     Hibiclens is an  antibacterial soap to help reduce bacteria on your skin. The soap does not lather. Take your usual shower, then pour Hibiclens on a wash cloth and use on your body from the neck down. Rinse thoroughly. DO NOT USE ABOVE THE NECK OR ON GENITALS AS IT COULD CAUSE EYE DAMAGE, BURNING, AND IRRITATION. If you experience itching or redness on application, wash it off and do not use again. A 4 ounce bottle can be purchased over the counter at most stores including: [employer], [employer], or [employer].       Marland Kitchen

## 2022-03-04 NOTE — Anesthesia Preprocedure Evaluation (Signed)
Anesthesia Pre-operative History and Physical for Gail Jensen  History and Physical Performed at [facility]  Highlighted Issues for this Procedure:  61 y.o. female with Lumbar radiculopathy, chronic [M54.16] presenting for Procedure(s) (LRB):  L4-5 TLIF (N/A)  INSERTION, INTERVERTEBRAL BIOMEDICAL DEVICE, SPINE, LUMBAR (N/A)  FUSION, SPINE, WITH INSTRUMENTATION (N/A)  AUTOGRAFT FOR SPINE SURGERY (N/A) by Surgeon(s):  Evelena Asa, MD scheduled for 270 minutes.  BMI Readings from Last 1 Encounters:  03/16/22 : 23.66 kg/m       .  [facility] Summary:  Gail Jensen presents preoperatively for anesthesia evaluation prior to  L4-5 TLIF, INSERTION, INTERVERTEBRAL BIOMEDICAL DEVICE, SPINE, LUMBAR FUSION,  WITH INSTRUMENTATION AUTOGRAFT FOR SPINE SURGERY under [place] on 03/16/22 with Dr. Charlton Jensen at [facility].     She has a past medical history:    - BMI 24.01  - OSA: Non-compliant with CPAP. Writer explained the importance and pt states she will start to use again.   - Lumbar radiculopathy: Planned procedure above.   - GERD: Well controlled on RABEprazole.   - Bipolar/anxiety: Managed on Zoloft and Lamictal.   - Hx of Cocaine abuse: In remission since 04/21/20.     Anesthesia Hx: Pt denies a personal or a family history of anesthesia complications.     Able to climb a FOS and lay flat without SOB. Completes grocery shopping with a push cart, completes yard and house work.             Perioperative Cardiac Risk:  [Keith#[id number] Gail Bologna, MD at 11:41 AM on 03/16/2022    Anesthesia Evaluation Information Source: records, patient     ANESTHESIA HISTORY     Denies anesthesia history  Pertinent(-):  No History of anesthetic complications or Family hx of anesthetic complications    GENERAL     Denies general issues  Pertinent (-):  No obesity, Anesthesia monitoring restrictions, infection, history of anesthetic complications or Family Hx of Anesthetic Complications    HEENT    + Visual Impairment          corrective lens for  ADL    + Hearing Loss            Right Ear: hearing aid            Left Ear: hearing aid    + Sinus Issues            allergic rhinitis    + Neck Pain            radicular pain with movement  Pertinent (-):  No hoarseness or TMJD PULMONARY     Denies pulmonary issues    + Currently Active Environmental Allergies    + Snoring          daytime tiredness    + Sleep apnea          CPAP and noncompliant  Pertinent(-):  No smoking, asthma, shortness of breath, cough/congestion or COPD    CARDIOVASCULAR     Denies cardiovascular issues  Good(4+METs) Exercise Tolerance  Pertinent(-):  No hypertension, past MI, angina, anticoagulants/antiplatelet medications, dysrhythmias, orthopnea or hx of DVT    Comment: Able to climb a FOS and lay flat without SOB. Completes grocery shopping with a push cart, completes yard and house work.       GI/HEPATIC/RENAL   NPO: > 8hrs ago (solids) and > 2hrs ago (clears)      + GERD          well controlled    +  Alcohol use          social  Pertinent(-):  No bowel issues, urinary issues or  esophageal issues  NEURO/PSYCH/ORTHO    + Dizziness/Motion Sickness (Occasionally with change of position)    + Chronic pain (7/10- sciatica left leg)    + Neuropsychiatric Issues          bipolar, anxiety, ADD/ADHD    + Peripheral Nerve Issue (Lumbar radiculopathy)  Pertinent(-):  No syncope, seizures, cerebrovascular event or neuromuscular disease    ENDO/OTHER    + Hormone Use (On progesterone and estrogens esterified-methyltestosterone)  Pertinent(-):  No diabetes mellitus, chemo Hx, menstruating (post-menopause)    HEMATOLOGIC     Denies hematologic issues  Pertinent(-):  No anticoagulants/antiplatelet medications, blood transfusion, blood dyscrasia or arthritis       Physical Exam    Airway            Mouth opening: normal            Mallampati: III            TM distance (fb): >3 FB            Neck ROM: full  Dental   Normal Exam   Comment: Good dentition- denies loose or cracked teeth.     Cardiovascular  Normal Exam           Rhythm: regular           Rate: normal  No peripheral edema or murmur      Neurologic      Current Pain Score: 7/10- left leg    + Sensory deficit (Left leg tingling)    General Survey    Normal Exam  No rashes, wounds   Pulmonary   Normal Exam    breath sounds clear to auscultation    Mental Status   Normal Exam    oriented to person, place and time    Operative Site    Normal Exam   Patient Education from [facility] Provider:  Patient Education from [facility]:  The following items were discussed with Gail Jensen to satisfaction and comprehension. All questions were addressed.  The facility's NPO guidelines were discussed  To call the surgeon if he becomes ill prior to surgery  All questions were answered  Medications DOS with sip of water: see avs  Hold medications AM day of surgery: see avs  Nurse reviewed additional items as indicated in the education record.  Kayron Schaan verbalized knowledge and teaching objectives met.  No barriers to learning identified.  Teaching sheet reviewed with patient/family.  IV insertion was reviewed with her.  The importance of coughing and deep breathing was emphasized.  She was instructed on the pain scale and pain management.  The [facility] valuable policy was discussed.  The patient was instructed not to wear jewelry.  Concepsion Mcalister was instructed to hold ASA/NSAIDS 5 days before surgery.        ________________________________________________________________________  PLAN  ASA Score  3  Anesthetic Plan general       Induction (routine IV) General Anesthesia/Sedation Maintenance Plan (IV bolus);  Airway Manipulation (direct laryngoscopy and video laryngoscope); Airway (cuffed ETT); Line ( use current access and additional large bore IV); Monitoring (standard ASA); Positioning (prone); PONV Plan (ondansetron); Pain (per surgical team); PostOp (PACU)Standard Attestation    Informed Consent     Risks:          Risks discussed were commensurate with  the plan listed above with the following  specific points: N/V, aspiration, sore throat, infection and fatigue, Damage to: teeth, allergic Rx.    Anesthetic Consent:         Anesthetic plan (and risks as noted above) were discussed with patient and sibling    Blood products Consent:        Use of blood products discussed with: patient and they consented    Plan also discussed with team members including:       CRNA    Responsible Anesthesia Provider Attestation:  I attest that the patient or proxy understands and accepts the risks and benefits of the anesthesia plan. I also attest that I have personally performed a pre-anesthetic examination and evaluation, and prescribed the anesthetic plan for this particular location within 48 hours prior to the anesthetic as documented. Gail Bologna, MD  03/16/22, 11:41 AM

## 2022-03-05 ENCOUNTER — Ambulatory Visit: Payer: Self-pay | Admitting: Orthopaedic Surgery

## 2022-03-05 ENCOUNTER — Other Ambulatory Visit: Payer: Self-pay

## 2022-03-05 ENCOUNTER — Ambulatory Visit
Admission: RE | Admit: 2022-03-05 | Discharge: 2022-03-05 | Disposition: A | Discharge status: Home / Self Care | Payer: BLUE CROSS/BLUE SHIELD | Source: Ambulatory Visit | Attending: Orthopaedic Surgery | Admitting: Orthopaedic Surgery

## 2022-03-05 DIAGNOSIS — M5416 Radiculopathy, lumbar region: Secondary | ICD-10-CM | POA: Insufficient documentation

## 2022-03-05 LAB — CBC
Hematocrit: 42 % (ref 34–49)
Hemoglobin: 13.4 g/dL (ref 11.2–16.0)
MCH: 30 pg (ref 26–32)
MCHC: 32 g/dL (ref 32–36)
MCV: 94 fL (ref 75–100)
Platelets: 224 10*3/uL (ref 150–450)
RBC: 4.4 MIL/uL (ref 4.0–5.5)
RDW: 12.4 % (ref 0.0–15.0)
WBC: 4.8 10*3/uL (ref 3.5–11.0)

## 2022-03-05 LAB — COMPREHENSIVE METABOLIC PANEL
ALT: 21 U/L (ref 0–35)
AST: 22 U/L (ref 0–35)
Albumin: 4.6 g/dL (ref 3.5–5.2)
Alk Phos: 55 U/L (ref 35–105)
Anion Gap: 11 (ref 7–16)
Bilirubin,Total: 0.3 mg/dL (ref 0.0–1.2)
CO2: 28 mmol/L (ref 20–28)
Calcium: 8.9 mg/dL (ref 8.6–10.2)
Chloride: 103 mmol/L (ref 96–108)
Creatinine: 0.82 mg/dL (ref 0.51–0.95)
Glucose: 100 mg/dL — ABNORMAL HIGH (ref 60–99)
Lab: 12 mg/dL (ref 6–20)
Potassium: 4.7 mmol/L (ref 3.3–5.1)
Sodium: 142 mmol/L (ref 133–145)
Total Protein: 6.5 g/dL (ref 6.3–7.7)
eGFR BY CREAT: 82 *

## 2022-03-09 ENCOUNTER — Ambulatory Visit: Payer: BLUE CROSS/BLUE SHIELD

## 2022-03-09 NOTE — Progress Notes (Deleted)
Department of Physical Medicine & Rehabilitation  Physical Therapy Discharge Summary    History/Subjective    Diagnosis: Acute mechanical low back pain with bilateral sciatica     Referring practitioner: Evelena Asa, MD    Onset date of symptoms:  01/23/2022    Subjective: Gail Jensen is a 61 y.o. female presenting to therapy with a chief complaint of acute mechanical low back pain that radiates down the posterior aspect of both LEs. She had a L4-L5 hemilaminectomy in March 2023 for L lumbar radiculopathy. She expresses that initially she was having symptoms mostly in the L lumbar spine down the L leg, but recently has been experiencing increased pain and radiating symptoms on the R side. She had some pain relief after surgery and attended physical therapy for 2 sessions, but has not been back since. She tends to manage her pain with heat, TENS, and ice, which gives her the most relief. She has recently had an exacerbation after paddle boarding earlier this month. She expresses that she has since had difficulty with housework, sleeping, personal care, ascending and descending stairs, and walking her dog secondary to pain. She lives alone in a 2-story house and gets assistance with her daily activities from her sister. She expresses that after walking for about an hour and a half, she begins to experience increased pain and numbness down her legs.     Kimmy reports that ***.      Mechanism of injury: Onset of increased symptoms after paddleboarding earlier this month.     Work Status: Not working because of pain.     Symptoms Worsen With: Walking for long periods of time >1 hour, bending, sleeping.     Symptoms Better With: Standing, supine, lumbar extension, and less than 1 hr of walking.    No past medical history on file.  No past surgical history on file.    Comorbidities affecting treatment/recovery:  none noted  Personal factors affecting treatment/recovery:  Advanced age  Unemployed  Clinical  presentation:  evolving    Patient complexity:    moderate level as indicated by above stability of condition, personal factors, environmental factors and comorbidities in addition to their impairments found on physical exam.    Pain:   Today:       Objective    Observation: Patient is a pleasant and cooperative 61 y.o. female NAD.    Gait: Decreased stance time on the L LE, antalgic gait pattern, decreased lumbar rotation with ambulation.    Red Flags:  No bladder/bowel dysfunction, focal motor loss, saddle anaesthesia    ROM Lumbar Spine %   Flexion Mod limitation **, sciatica symptoms   Extension Mod limitation *, no sciatica symptoms, some relief once extended more   Right Sidebending Min limitation, pain returning to starting position   Left Sidebending Min limitation, pain returning to starting position   Right Rotation WFL, pain returning to starting position   Left Rotation WFL   *indicates pain    Strength:      LE Muscle Right  Left    HIP     Flexion 5  5   Abduction 2 4-    Internal Rotation 5 5   External Rotation 4-** 4+   KNEE     Extension 5  5   Flexion  4- 5   ANKLE     Dorsiflexion  5 5   *indicates pain       SPECIAL TEST Lumbar Spine + / -  SLR + L side    SLR with DF + L side   + reproduction of symptoms    Oswestry Disability Index:   Previously: 78%    Assessment:    Gail Jensen is a 61 y.o. with a diagnosis of acute mechanical low back pain with bilateral sciatica. She has consistently attended therapy and adhered to her HEP. Upon re-assessment, she continues to have impairments including increased pain, decreased range of motion, decreased strength, radicular symptoms, as well as an antalgic gait pattern, which are continuing to prevent her ability to stand and walk for longer periods of time, sleep through the night, as well as complete many of her daily tasks without increased dysfunction. Kimmy has decided to go forward with surgery in order to address this condition and will be discharged at  this time. She has not been able to reach her goals for therapy, but has been educated on the importance of continuing her mobility and core stabilization exercises for her lower back prior to her surgery. She will likely need physical therapy after her surgery, but due to potential lifting restrictions and other precautionary measures after surgery, she will be discharged at this time.     Gail Jensen is a 61 y.o. female     Rehab potential/prognosis: good  Patient's understanding: good    Patient's Goal:  To return to her normal daily activities.     Plan  Plan of Care: Appropriate for discharge.     PT interventions: AROM/PROM/Therapeutic exercise, Balance activites, Closed chain activites, Cold, Flexibility, Gait training/Functional activities, General conditioning, Heat, Home exercise program instruction, Joint mobilizations, Lumbar traction, mechanical, Manual therapy, McKenzie exercises, Myofacial release, Neuromuscular Re-education, Patient/Family Education, Postural training/body Curator education, Strengthening, Therapeutic Activities    PT frequency:  Once a week    PT duration: 8 weeks      Short term goals (4 weeks):  1. Initiate HEP  2. Patient will improve Oswestry Disability Index score to 50% in order to complete daily tasks with decreased pain and to demonstrate decreased disability.   3. Patient will report being able to ambulate for >=1.5 hours without increased reports of pain.   4. Patient will report being able to complete household tasks for >=30 minutes without increased level of pain.     Long term goals (8 weeks): -NOT MET  1. Independent with final HEP  2. Patient will improve Oswestry Disability Index score to 25% in order to complete daily tasks with decreased pain and to demonstrate decreased disability.  3. Patient will report being able to ambulate for >=3 hours without increased reports of pain.   4. Patient will report being able to complete household tasks for >=1 hour without  increased level of pain.       Thank you for the referral.  If you have any questions and/or concerns, please feel free to contact me at [phone].      Guinevere Scarlet, Student PT  Andee Poles, PT, DPT

## 2022-03-11 ENCOUNTER — Ambulatory Visit: Payer: BLUE CROSS/BLUE SHIELD | Admitting: Obstetrics and Gynecology

## 2022-03-11 ENCOUNTER — Encounter: Payer: Self-pay | Admitting: Obstetrics and Gynecology

## 2022-03-11 ENCOUNTER — Other Ambulatory Visit
Admission: RE | Admit: 2022-03-11 | Discharge: 2022-03-11 | Disposition: A | Discharge status: Home / Self Care | Payer: BLUE CROSS/BLUE SHIELD | Source: Ambulatory Visit | Attending: Obstetrics and Gynecology | Admitting: Obstetrics and Gynecology

## 2022-03-11 ENCOUNTER — Other Ambulatory Visit: Payer: Self-pay

## 2022-03-11 VITALS — BP 102/82 | Ht 68.0 in | Wt 154.0 lb

## 2022-03-11 DIAGNOSIS — Z780 Asymptomatic menopausal state: Secondary | ICD-10-CM | POA: Insufficient documentation

## 2022-03-11 DIAGNOSIS — Z01818 Encounter for other preprocedural examination: Secondary | ICD-10-CM | POA: Insufficient documentation

## 2022-03-11 DIAGNOSIS — Z79890 Hormone replacement therapy (postmenopausal): Secondary | ICD-10-CM

## 2022-03-11 LAB — CBC
Hematocrit: 43 % (ref 34–49)
Hemoglobin: 14.1 g/dL (ref 11.2–16.0)
MCH: 31 pg (ref 26–32)
MCHC: 33 g/dL (ref 32–36)
MCV: 93 fL (ref 75–100)
Platelets: 262 10*3/uL (ref 150–450)
RBC: 4.6 MIL/uL (ref 4.0–5.5)
RDW: 12.2 % (ref 0.0–15.0)
WBC: 5.8 10*3/uL (ref 3.5–11.0)

## 2022-03-11 LAB — COMPREHENSIVE METABOLIC PANEL
ALT: 27 U/L (ref 0–35)
AST: 27 U/L (ref 0–35)
Albumin: 4.8 g/dL (ref 3.5–5.2)
Alk Phos: 58 U/L (ref 35–105)
Anion Gap: 9 (ref 7–16)
Bilirubin,Total: 0.4 mg/dL (ref 0.0–1.2)
CO2: 29 mmol/L — ABNORMAL HIGH (ref 20–28)
Calcium: 9.9 mg/dL (ref 8.6–10.2)
Chloride: 101 mmol/L (ref 96–108)
Creatinine: 0.92 mg/dL (ref 0.51–0.95)
Glucose: 95 mg/dL (ref 60–99)
Lab: 16 mg/dL (ref 6–20)
Potassium: 4.7 mmol/L (ref 3.3–5.1)
Sodium: 139 mmol/L (ref 133–145)
Total Protein: 7.1 g/dL (ref 6.3–7.7)
eGFR BY CREAT: 72 *

## 2022-03-11 LAB — ESTRADIOL: Estradiol: 32 pg/mL

## 2022-03-11 LAB — MULTIPLE ORDERING DOCS

## 2022-03-11 MED ORDER — ESTRADIOL 0.0375 MG/24HR TD PTTW *A*
1.0000 | MEDICATED_PATCH | TRANSDERMAL | 2 refills | Status: DC
Start: 2022-03-12 — End: 2022-04-11

## 2022-03-11 NOTE — Progress Notes (Signed)
Gail Jensen is a 61 y.o. female here for new patient intake and discussion of menopause symptoms.      HPI: Gail Jensen states she has been in menopause since she was 31, she has a history of hot flashes and sleep/mood impact. She has used estradiol patch in the past but was found to have low testosterone thought to be impacting her libido and her energy levels. She uses Prometrium 200 mg one po daily for 15 days every three months. She did have a history of break through bleeding in the past, but has been doing well in the recent past. She is using Sertraline at 100 mg to help with menopausal symptoms. Gail Jensen recently moved back to the [place] area. She is to have TLIF for recurrent back pain and sciatica. She had an injury in January of this year, ad a laminectomy in March. She finds she has had weight gain in relationship to difficulty with being active.  She is starting with a new PCP a week or so after surgery.   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 She has 2 grown children, who are doing well. Her father and her sister live in this area and she has supportive friends nearby.  Pap negative with negative HPV 08/2021, Mammogram is due, Colonoscopy up to date      Gynecologic History:  No LMP recorded. Patient is postmenopausal.         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.9      Allergies   Allergen Reactions    Ciprofloxacin Hives    Sulfa Antibiotics Hives     hives    Sulfasalazine Other (See Comments)     hives       Current Outpatient Medications   Medication    bimatoprost (LATISSE) 0.03 % ophthalmic solution    fluorouracil (EFUDEX) 5 % cream    NONFORMULARY, OTHER, ORDER *I*    RABEprazole (ACIPHEX) 20 MG tablet     valACYclovir (VALTREX) 500 mg tablet    PROGESTERONE PO    sertraline (ZOLOFT) 100 mg tablet    Multiple Vitamin (DAILY-VITE MULTIVITAMIN) TABS    lamoTRIgine (LAMICTAL) 100 mg tablet    [START ON 03/12/2022] estradiol (VIVELLE-DOT) 0.0375 MG/24HR    clobetasol (TEMOVATE) 0.05 % external solution    ibuprofen (ADVIL,MOTRIN) 800 mg tablet    lidocaine (XYLOCAINE) 5 % ointment    meloxicam (MOBIC) 15 mg tablet    traZODone (DESYREL) 50 mg tablet     No current facility-administered medications for this visit.       ROS:  Per HPI  CONSTITUTIONAL: Appetite good, no fevers, night sweats or weight loss Difficulty with sleep  Cardiac: No noted concerns  GI: Constipation at times  GU: Some vaginal dryness  PSYCH: As per HPI     Objective:  BP 102/82 (BP Location: Right arm, Patient Position: Sitting, Cuff Size: adult)   Ht 1.727 m (5\' 8" )   Wt 69.9 kg (154 lb)   BMI 23.42 kg/m      GENERAL: 61 y.o. year y/o female in NAD.  Cardiac: Apical rate and rhythm are regular without noted rub or murmur  Lungs: Clear to auscultation all lobes, bilaterally   BREASTS: Breast augmentation, bilaterally. No palpable masses or nipple discharge. No skin changes. No axillary or clavicular adenopathy.  MENTAL STATUS: Alert, normal MS. Answers all questions appropriately.      Assessment:  New patient intake  Post menopause  Hormone replacement    Plan:   Will stop estratest, to start Estradiol patch at 0.0375 mg one patch twice weekly  Continue with Prometrium 200 mg one po daily for 14 days every three months, will call if needs refill  Sertraline 100 mg one po daily, will call if needs refills, using for menopausal concerns   Return to office: Six months well woman exam

## 2022-03-15 NOTE — Invasive Procedure Plan of Care (Signed)
[facility]  [facility]  [facility]    CONSENT FOR MEDICAL  OR SURGICAL PROCEDURE                            Patient Name: Gail Jensen  [facility] [address]                                                              DOB: [birth year]           Please read this form or have someone read it to you.   It's important to understand all parts of this form. If something isn't clear, ask [place] to explain.   When you sign it, that means you understand the form and give [place] permission to do this surgery or procedure.     I agree for Orthopaedic Spine Surgery Attending Evelena Asa MD along with any assistants* they may choose, to treat the following condition(s): chronic left L5 radiculopathy, L4-5 degenerative spondylolisthesis, s/p left L4-5 hemilaminectomy   By doing this surgery or procedure on me: L4-5 TLIF   This is also known as: L4-5 laminectomy and fusion with interbody device    *if you'd like a list of the assistants, please ask. We can give that to you.    1. The care provider has explained my condition to me. They have told me how the procedure can help me. They have told me about other ways of treating my condition. I understand the care provider cannot guarantee the result of the procedure. If I don't have this procedure, my other choices are: No surgery/observation; Non-operative management    2. The care provider has told me the risks (problems that can happen) of the procedure. I understand there may be unwanted results. The risks that are related to this procedure include:   - Infection  - Blood loss  - Blood clots   - Dural tear  - Damage to blood vessels  - Problems with bowel and bladder  - Damage to nerves resulting in weakness, numbness, pain  - Persistent or incomplete relief of pain/sensory disturbance/weakness  - Temporary or permanent paralysis  - Risks of anesthesia up to and including stroke, blindness, death, pain, incomplete relief of pain or other symptoms   - Need for  additional surgery  - Failure to fuse/non-union  - Failure of instrumentation      3. I understand that during the procedure, my care provider may find a condition that we didn't know about before the treatment started. Therefore, I agree that my care provider can perform any other treatment which they think is necessary and available.    4. I understand the care provider may remove tissue, body parts, or materials during this procedure. These materials may be used to help with my diagnosis and treatment. They might also be used for teaching purposes or for research studies that I have separately agreed to participate in. Otherwise they will be disposed of as required by law.    5. My care provider might want a representative from a medical device company to be there during my procedure. I understand that person works for:   [employer], [employer]        The ways  they might help my care provider during my procedure include:        Helping the operating room staff prepare the special device my care provider wants to use and providing information and support to operating room staff regarding the device    6. Here are my decisions about receiving blood, blood products, or tissues. I understand my decisions cover the time before, during and after my procedure, my treatment, and my time in the hospital. After my procedure, if my condition changes a lot, my care provider will talk with me again about receiving blood or blood products. At that time, my care provider might need me to review and sign another consent form, about getting or refusing blood.    I understand that the blood is from the community blood supply. Volunteers donated the blood, the volunteers were screened for health problems. The blood was examined with very sensitive and accurate tests to look for hepatitis, HIV/AIDS, and other diseases. Before I receive blood, it is tested again to make sure it is the correct type.    My chances of getting a sickness  from blood products are small. But no transfusion is 100% safe. I understand that my care provider feels the good I will receive from the blood is greater than the chances of something going wrong. My care provider has answered my questions about blood products.      My decision  about blood or  blood products   Yes, I agree to receive blood or blood products if my care provider thinks they're needed.  YES      My decision   about tissue  Implants     Yes, I agree to receive tissue implants if my care provider thinks they're needed.   - tissues may include: cavaderic bone graft.       I understand this  form.    My care provider  or his/her  assistants have  explained:   What I am having done and why I need it.  What other choices I can make instead of having this done.  The benefits and possible risks (problems) to me of having this done.  The benefits and possible risks (problems) to me of receiving transplants, blood, or blood products.  There is no guarantee of the results.  The care provider may not stay with me the entire time that I am in the operating or procedure room.  My provider has explained how this may affect my procedure. My provider has answered my  questions about this.         I give my  permission for  this surgery or  procedure.            _______________________________________________                                     My signature  (or parent or other person authorized to sign for you, if you are unable to sign for  yourself or if you are under 70 years old)    In to order prevent possible spread of disease, this form was completed with a clear verbal understanding by the patient and/or family/care providers and physical signature was not obtained.        03/15/2022  ______           Date   8:42 PM  _____        Time   Electronic Signatures will display at the bottom of the consent form.    Care provider's statement: I have discussed the planned procedure, including the possibility for  transfusion of blood  products or receipt of tissue as necessary; expected benefits; the possible complications and risks; and possible alternatives  and their benefits and risks with the patients or the patient's surrogate. In my opinion, the patient or the patient's surrogate  understands the proposed procedure, its risks, benefits and alternatives.              Electronically signed by: Evelena Asa, MD                             03/15/2022          Date        8:42 PM          Time   Your doctor or someone your doctor has appointed has told you that you may need blood or a blood product transfusion, which has been collected from volunteers, as part of your treatment as a patient.    The reasons you might need blood or blood products include, but are not limited to:    Significant loss of your own blood   Your body may not be getting enough oxygen to its tissues    Treatment of bleeding disorders caused by low platelet counts or platelets that do not work right (platelets are part of a cell that helps to form clots and keeps you from bleeding too much).   You may not have enough of other substances that help your blood clot or stop you from bleeding more  The risks of getting a transfusion of blood or blood products include, but are not limited to:    Damage to the lungs   Difficulty breathing due to fluid in the lungs   The product may contain bacteria or rarely a virus (which includes HIV and Hepatitis)   Blood from the community blood supply has been collected from volunteer donors who have been screened for health risk. The blood has been tested for major blood transmitted disease, but no transfusion is 100% safe. The blood is tested with very sensitive and accurate tests to screen for hepatitis, AIDS, and other disease, which makes the risks very small.   You may have side effects from the transfusion (rash, fever, chills) or an allergic reaction   The transfusion increases your risks of getting  infection or cancer coming back   The transfusion can increase the time you have to stay in the hospital   The transfusion can potentially cause death if the wrong blood is given or your body rejects the blood   Before blood is transfused, it is tested again to make sure it is the correct type  There are other options than getting blood or blood products from other people and they include:    Drugs which can decrease bleeding   Drugs which can cause your body to make more blood (used in elective procedures with advance notice)   Autologous (your own blood) donation (needs pre-arrangement)   No transfusion  If you exercise your right to refuse to be transfused with blood or blood products; these things listed below, among others, could happen to you:   Your body may not get enough oxygen and suffer damage   You may  have a higher chance of bleeding   You may limit other options for your condition   You may die from losing too much blood    In to order prevent possible spread of disease, this form was completed with a clear verbal understanding by the patient and/or family/care providers and physical signature was not obtained.

## 2022-03-16 ENCOUNTER — Inpatient Hospital Stay: Payer: BLUE CROSS/BLUE SHIELD

## 2022-03-16 ENCOUNTER — Other Ambulatory Visit: Payer: Self-pay

## 2022-03-16 ENCOUNTER — Inpatient Hospital Stay
Admission: RE | Admit: 2022-03-16 | Discharge: 2022-03-18 | Disposition: A | Discharge status: Home / Self Care | Payer: BLUE CROSS/BLUE SHIELD | Source: Ambulatory Visit | Attending: Orthopaedic Surgery | Admitting: Orthopaedic Surgery

## 2022-03-16 ENCOUNTER — Inpatient Hospital Stay: Payer: BLUE CROSS/BLUE SHIELD | Admitting: Nurse Practitioner

## 2022-03-16 ENCOUNTER — Encounter
Admission: RE | Disposition: A | Discharge status: Home / Self Care | Payer: Self-pay | Source: Ambulatory Visit | Attending: Orthopaedic Surgery

## 2022-03-16 ENCOUNTER — Encounter: Payer: Self-pay | Admitting: Orthopaedic Surgery

## 2022-03-16 DIAGNOSIS — Z9889 Other specified postprocedural states: Secondary | ICD-10-CM

## 2022-03-16 DIAGNOSIS — F419 Anxiety disorder, unspecified: Secondary | ICD-10-CM | POA: Insufficient documentation

## 2022-03-16 DIAGNOSIS — M5416 Radiculopathy, lumbar region: Secondary | ICD-10-CM

## 2022-03-16 DIAGNOSIS — M48061 Spinal stenosis, lumbar region without neurogenic claudication: Secondary | ICD-10-CM

## 2022-03-16 DIAGNOSIS — G9782 Other postprocedural complications and disorders of nervous system: Secondary | ICD-10-CM | POA: Insufficient documentation

## 2022-03-16 DIAGNOSIS — K219 Gastro-esophageal reflux disease without esophagitis: Secondary | ICD-10-CM | POA: Insufficient documentation

## 2022-03-16 DIAGNOSIS — G4733 Obstructive sleep apnea (adult) (pediatric): Secondary | ICD-10-CM | POA: Insufficient documentation

## 2022-03-16 HISTORY — PX: PR ARTHRODESIS COMBINED TQ 1NTRSPC LUMBAR: 22633

## 2022-03-16 LAB — TYPE AND SCREEN
ABO RH Blood Type: A POS
Antibody Screen: NEGATIVE

## 2022-03-16 SURGERY — FUSION, SPINE, LUMBAR, TLIF, 1 LEVEL
Anesthesia: General | Site: Back | Wound class: Clean

## 2022-03-16 MED ORDER — LIDOCAINE HCL 2 % IJ SOLN *I*
INTRAMUSCULAR | Status: DC | PRN
Start: 2022-03-16 — End: 2022-03-16
  Administered 2022-03-16: 100 mg via INTRAVENOUS

## 2022-03-16 MED ORDER — LACTATED RINGERS IV SOLN *I*
20.0000 mL/h | INTRAVENOUS | Status: DC
Start: 2022-03-16 — End: 2022-03-16
  Administered 2022-03-16: 20 mL/h via INTRAVENOUS

## 2022-03-16 MED ORDER — SERTRALINE HCL 100 MG PO TABS *I*
100.0000 mg | ORAL_TABLET | Freq: Every day | ORAL | Status: DC
Start: 2022-03-16 — End: 2022-03-18
  Administered 2022-03-16 – 2022-03-17 (×2): 100 mg via ORAL
  Filled 2022-03-16 (×4): qty 1

## 2022-03-16 MED ORDER — THROMBIN (RECOMBINANT) 5000 UNIT EX SOLR *I* WRAPPED
CUTANEOUS | Status: AC
Start: 2022-03-16 — End: 2022-03-16
  Filled 2022-03-16: qty 1

## 2022-03-16 MED ORDER — DOCUSATE SODIUM 100 MG PO CAPS *I*
200.0000 mg | ORAL_CAPSULE | Freq: Every evening | ORAL | Status: DC
Start: 2022-03-16 — End: 2022-03-18
  Administered 2022-03-17 (×2): 200 mg via ORAL

## 2022-03-16 MED ORDER — DEXMEDETOMIDINE HCL 200 MCG/2ML IV SOLN WRAPPED *I*
INTRAVENOUS | Status: DC | PRN
  Administered 2022-03-16: 2 ug via INTRAVENOUS
  Administered 2022-03-16: 10 ug via INTRAVENOUS
  Administered 2022-03-16 (×2): 4 ug via INTRAVENOUS

## 2022-03-16 MED ORDER — HYDROMORPHONE HCL PF 1 MG/ML IJ SOLN *WRAPPED*
0.5000 mg | INTRAMUSCULAR | Status: DC | PRN
Start: 2022-03-16 — End: 2022-03-16
  Administered 2022-03-16 (×2): 0.5 mg via INTRAVENOUS

## 2022-03-16 MED ORDER — BISACODYL 10 MG RE SUPP *I*
10.0000 mg | Freq: Every day | RECTAL | Status: DC | PRN
Start: 2022-03-16 — End: 2022-03-18

## 2022-03-16 MED ORDER — SENNOSIDES 8.6 MG PO TABS *I*
2.0000 | ORAL_TABLET | Freq: Every day | ORAL | Status: DC
Start: 2022-03-17 — End: 2022-03-18
  Administered 2022-03-17 – 2022-03-18 (×2): 2 via ORAL

## 2022-03-16 MED ORDER — SODIUM CHLORIDE 0.9 % IR SOLN *I*
Status: AC | PRN
Start: 2022-03-16 — End: 2022-03-16
  Administered 2022-03-16: 1000 mL

## 2022-03-16 MED ORDER — SODIUM CHLORIDE 0.9 % 25 ML IV SOLN *I*
10.0000 mg | Freq: Four times a day (QID) | INTRAVENOUS | Status: DC | PRN
Start: 2022-03-16 — End: 2022-03-18

## 2022-03-16 MED ORDER — ACETAMINOPHEN 10 MG/ML IV SOLN *I*
1000.0000 mg | Freq: Once | INTRAVENOUS | Status: DC
Start: 2022-03-16 — End: 2022-03-16

## 2022-03-16 MED ORDER — HYDROMORPHONE HCL PF 1 MG/ML IJ SOLN *WRAPPED*
0.2500 mg | INTRAMUSCULAR | Status: DC | PRN
Start: 2022-03-16 — End: 2022-03-16

## 2022-03-16 MED ORDER — LAMOTRIGINE 25 MG PO TABS *I*
150.0000 mg | ORAL_TABLET | Freq: Every day | ORAL | Status: DC
Start: 2022-03-16 — End: 2022-03-18
  Administered 2022-03-16 – 2022-03-17 (×2): 150 mg via ORAL
  Filled 2022-03-16 (×4): qty 2

## 2022-03-16 MED ORDER — FLUORESCEIN SODIUM 1 MG OP STRP *I*
ORAL_STRIP | OPHTHALMIC | Status: AC
Start: 2022-03-16 — End: 2022-03-16
  Filled 2022-03-16: qty 5

## 2022-03-16 MED ORDER — BUPIVACAINE-EPINEPHRINE 0.5 % IJ SOLUTION *WRAPPED*
INTRAMUSCULAR | Status: AC
Start: 2022-03-16 — End: 2022-03-16
  Filled 2022-03-16: qty 30

## 2022-03-16 MED ORDER — NEOMYCIN-BACITRACIN ZN-POLYMYX 3.5-400-10000 OP OINT *I*
TOPICAL_OINTMENT | Freq: Three times a day (TID) | OPHTHALMIC | Status: DC
Start: 2022-03-16 — End: 2022-03-18
  Filled 2022-03-16 (×2): qty 3.5

## 2022-03-16 MED ORDER — BSS IO SOLN *I*
INTRAOCULAR | Status: AC
Start: 2022-03-16 — End: 2022-03-16
  Filled 2022-03-16: qty 15

## 2022-03-16 MED ORDER — MIDAZOLAM HCL 1 MG/ML IJ SOLN *I* WRAPPED
INTRAMUSCULAR | Status: AC
Start: 2022-03-16 — End: 2022-03-16
  Filled 2022-03-16: qty 2

## 2022-03-16 MED ORDER — FENTANYL CITRATE 50 MCG/ML IJ SOLN *WRAPPED*
25.0000 ug | INTRAMUSCULAR | Status: AC | PRN
Start: 2022-03-16 — End: 2022-03-16
  Administered 2022-03-16 (×4): 25 ug via INTRAVENOUS

## 2022-03-16 MED ORDER — CEFAZOLIN 2000 MG IN STERILE WATER 20ML SYRINGE *I*
2000.0000 mg | PREFILLED_SYRINGE | Freq: Three times a day (TID) | INTRAVENOUS | Status: AC
Start: 2022-03-16 — End: 2022-03-17
  Administered 2022-03-16 – 2022-03-17 (×2): 2000 mg via INTRAVENOUS

## 2022-03-16 MED ORDER — ACETAMINOPHEN 500 MG PO TABS *I*
ORAL_TABLET | ORAL | Status: AC
Start: 2022-03-16 — End: 2022-03-16
  Filled 2022-03-16: qty 2

## 2022-03-16 MED ORDER — BUPIVACAINE-EPINEPHRINE 0.5 % IJ SOLUTION *WRAPPED*
INTRAMUSCULAR | Status: DC | PRN
Start: 2022-03-16 — End: 2022-03-16
  Administered 2022-03-16: 20 mL via SUBCUTANEOUS

## 2022-03-16 MED ORDER — OXYCODONE HCL 5 MG PO TABS *I*
5.0000 mg | ORAL_TABLET | ORAL | Status: DC | PRN
Start: 2022-03-16 — End: 2022-03-17

## 2022-03-16 MED ORDER — LIDOCAINE HCL 1% IJ SOLN *WRAPPED* (FOR LINE PLACEMENT ONLY)
0.1000 mL | INTRAMUSCULAR | Status: DC | PRN
Start: 2022-03-16 — End: 2022-03-16
  Administered 2022-03-16: 0.1 mL via SUBCUTANEOUS

## 2022-03-16 MED ORDER — PROPOFOL 10 MG/ML IV EMUL (INTERMITTENT DOSING) WRAPPED *I*
INTRAVENOUS | Status: DC | PRN
Start: 2022-03-16 — End: 2022-03-16
  Administered 2022-03-16: 200 mg via INTRAVENOUS

## 2022-03-16 MED ORDER — ACETAMINOPHEN 160 MG/5 ML WRAPPED *I*
1000.0000 mg | Freq: Once | Status: AC
Start: 2022-03-16 — End: 2022-03-16

## 2022-03-16 MED ORDER — DEXTROSE 5 % FLUSH FOR PUMPS *I*
0.0000 mL/h | INTRAVENOUS | Status: DC | PRN
Start: 2022-03-16 — End: 2022-03-18

## 2022-03-16 MED ORDER — ACETAMINOPHEN 500 MG PO TABS *I*
1000.0000 mg | ORAL_TABLET | Freq: Three times a day (TID) | ORAL | Status: DC
Start: 2022-03-16 — End: 2022-03-17
  Administered 2022-03-16: 1000 mg via ORAL

## 2022-03-16 MED ORDER — FENTANYL CITRATE 50 MCG/ML IJ SOLN *WRAPPED*
INTRAMUSCULAR | Status: AC
Start: 2022-03-16 — End: 2022-03-16
  Filled 2022-03-16: qty 2

## 2022-03-16 MED ORDER — POLYETHYLENE GLYCOL 3350 PO PACK 17 GM *I*
17.0000 g | PACK | Freq: Every day | ORAL | Status: DC | PRN
Start: 2022-03-16 — End: 2022-03-18

## 2022-03-16 MED ORDER — ACETAMINOPHEN 500 MG PO TABS *I*
1000.0000 mg | ORAL_TABLET | Freq: Once | ORAL | Status: AC
Start: 2022-03-16 — End: 2022-03-16
  Administered 2022-03-16: 1000 mg via ORAL

## 2022-03-16 MED ORDER — MIDAZOLAM HCL 1 MG/ML IJ SOLN *I* WRAPPED
INTRAMUSCULAR | Status: DC | PRN
Start: 2022-03-16 — End: 2022-03-16
  Administered 2022-03-16: 2 mg via INTRAVENOUS

## 2022-03-16 MED ORDER — SODIUM CHLORIDE 0.9 % IV SOLN WRAPPED *I*
20.0000 mL/h | Status: DC
Start: 2022-03-16 — End: 2022-03-16

## 2022-03-16 MED ORDER — THROMBIN (RECOMBINANT) 5000 UNIT EX SOLR *I* WRAPPED
CUTANEOUS | Status: DC | PRN
Start: 2022-03-16 — End: 2022-03-16
  Administered 2022-03-16: 10000 [IU] via TOPICAL

## 2022-03-16 MED ORDER — ONDANSETRON HCL 2 MG/ML IV SOLN *I*
4.0000 mg | Freq: Four times a day (QID) | INTRAMUSCULAR | Status: DC | PRN
Start: 2022-03-16 — End: 2022-03-18

## 2022-03-16 MED ORDER — ARTIFICIAL TEARS OP OINT *WRAPPED* *I*
TOPICAL_OINTMENT | OPHTHALMIC | Status: AC
Start: 2022-03-16 — End: 2022-03-16
  Filled 2022-03-16: qty 3.5

## 2022-03-16 MED ORDER — SUGAMMADEX SODIUM 100 MG/1ML IV SOLN *WRAPPED*
INTRAVENOUS | Status: DC | PRN
Start: 2022-03-16 — End: 2022-03-16
  Administered 2022-03-16: 100 mg via INTRAVENOUS

## 2022-03-16 MED ORDER — PANTOPRAZOLE SODIUM 40 MG PO TBEC *I*
40.0000 mg | DELAYED_RELEASE_TABLET | Freq: Every morning | ORAL | Status: DC
Start: 2022-03-17 — End: 2022-03-18
  Administered 2022-03-17 – 2022-03-18 (×2): 40 mg via ORAL

## 2022-03-16 MED ORDER — MAGNESIUM HYDROXIDE 400 MG/5ML PO SUSP *I*
30.0000 mL | Freq: Every day | ORAL | Status: DC | PRN
Start: 2022-03-16 — End: 2022-03-18

## 2022-03-16 MED ORDER — OXYCODONE HCL 5 MG PO TABS *I*
10.0000 mg | ORAL_TABLET | ORAL | Status: DC | PRN
Start: 2022-03-16 — End: 2022-03-17
  Administered 2022-03-16 – 2022-03-17 (×3): 10 mg via ORAL

## 2022-03-16 MED ORDER — ONDANSETRON HCL 2 MG/ML IV SOLN *I*
INTRAMUSCULAR | Status: DC | PRN
Start: 2022-03-16 — End: 2022-03-16
  Administered 2022-03-16: 4 mg via INTRAVENOUS

## 2022-03-16 MED ORDER — FENTANYL CITRATE 50 MCG/ML IJ SOLN *WRAPPED*
INTRAMUSCULAR | Status: DC | PRN
Start: 2022-03-16 — End: 2022-03-16
  Administered 2022-03-16: 100 ug via INTRAVENOUS

## 2022-03-16 MED ORDER — HYDROMORPHONE HCL PF 1 MG/ML IJ SOLN *WRAPPED*
0.5000 mg | INTRAMUSCULAR | Status: DC | PRN
Start: 2022-03-16 — End: 2022-03-18
  Administered 2022-03-17 – 2022-03-18 (×5): 0.5 mg via INTRAVENOUS

## 2022-03-16 MED ORDER — POVIDONE-IODINE 10 % EX SWAB *I*
4.0000 | Freq: Once | CUTANEOUS | Status: AC
Start: 2022-03-16 — End: 2022-03-16
  Administered 2022-03-16: 4 via TOPICAL

## 2022-03-16 MED ORDER — CEFAZOLIN 2000 MG IN STERILE WATER 20ML SYRINGE *I*
2000.0000 mg | PREFILLED_SYRINGE | Freq: Once | INTRAVENOUS | Status: AC
Start: 2022-03-16 — End: 2022-03-16
  Administered 2022-03-16: 2000 mg via INTRAVENOUS

## 2022-03-16 MED ORDER — PLASMA-LYTE IV SOLN *WRAPPED*
Status: DC | PRN
Start: 2022-03-16 — End: 2022-03-16

## 2022-03-16 MED ORDER — TIZANIDINE HCL 4 MG PO TABS *I*
4.0000 mg | ORAL_TABLET | Freq: Four times a day (QID) | ORAL | Status: DC | PRN
Start: 2022-03-16 — End: 2022-03-18
  Administered 2022-03-17: 4 mg via ORAL
  Filled 2022-03-16 (×2): qty 1

## 2022-03-16 MED ORDER — OXYCODONE HCL 10 MG PO TABS *I*
ORAL_TABLET | ORAL | Status: AC
Start: 2022-03-16 — End: 2022-03-16
  Filled 2022-03-16: qty 1

## 2022-03-16 MED ORDER — HYDROMORPHONE HCL PF 1 MG/ML IJ SOLN *WRAPPED*
INTRAMUSCULAR | Status: AC
Start: 2022-03-16 — End: 2022-03-16
  Filled 2022-03-16: qty 0.5

## 2022-03-16 MED ORDER — DEXAMETHASONE SODIUM PHOSPHATE 4 MG/ML INJ SOLN *WRAPPED*
INTRAMUSCULAR | Status: DC | PRN
Start: 2022-03-16 — End: 2022-03-16
  Administered 2022-03-16: 8 mg via INTRAVENOUS

## 2022-03-16 MED ORDER — CEFAZOLIN 2000 MG IN STERILE WATER 20ML SYRINGE *I*
PREFILLED_SYRINGE | INTRAVENOUS | Status: AC
Start: 2022-03-16 — End: 2022-03-16
  Filled 2022-03-16: qty 20

## 2022-03-16 MED ORDER — ROCURONIUM BROMIDE 10 MG/ML IV SOLN *WRAPPED*
Status: DC | PRN
Start: 2022-03-16 — End: 2022-03-16
  Administered 2022-03-16: 20 mg via INTRAVENOUS
  Administered 2022-03-16: 10 mg via INTRAVENOUS
  Administered 2022-03-16: 50 mg via INTRAVENOUS

## 2022-03-16 MED ORDER — ONDANSETRON HCL 2 MG/ML IV SOLN *I*
4.0000 mg | Freq: Once | INTRAMUSCULAR | Status: DC | PRN
Start: 2022-03-16 — End: 2022-03-16

## 2022-03-16 MED ORDER — SODIUM CHLORIDE 0.9 % FLUSH FOR PUMPS *I*
0.0000 mL/h | INTRAVENOUS | Status: DC | PRN
Start: 2022-03-16 — End: 2022-03-18

## 2022-03-16 MED ORDER — ACETAMINOPHEN 500 MG PO TABS *I*
1000.0000 mg | ORAL_TABLET | Freq: Three times a day (TID) | ORAL | Status: DC
Start: 2022-03-16 — End: 2022-03-18
  Administered 2022-03-17 – 2022-03-18 (×4): 1000 mg via ORAL

## 2022-03-16 MED ORDER — TETRACAINE HCL 0.5 % OP SOLN *I*
OPHTHALMIC | Status: AC
Start: 2022-03-16 — End: 2022-03-16
  Filled 2022-03-16: qty 1

## 2022-03-16 SURGICAL SUPPLY — 45 items
APPLICATOR CHLORAPREP 26ML ORANGE LARGE (Solution) ×2 IMPLANT
BLADE GUARDED EXTN 6.5IN (Supply) IMPLANT
BLADE TEFLON 4IN MODIFIED (Supply) ×1 IMPLANT
BLADE THE MILL BONE MED 5MM DISP (Other) IMPLANT
BUR SURGICAL L14CM HEAD L3.8MM DIA3MM MATCH HEAD FLUTED LARGE BORE MR8 (Supply) ×1 IMPLANT
COVER HANDLE FOR STERIS LIGHTING STERILE (Supply) ×3 IMPLANT
COVER MAYO STD W24XL53IN 3 LAYR SMS RECOIL TECH DISP (Drape) ×1 IMPLANT
CUTTER MTL HD L18.3MM DIA3MM CARB LEGEND MIDAS REX (Other) IMPLANT
DRAIN BULB RESERVE 400CC (Supply) IMPLANT
DRAIN WOUND RND SIL 15 FR HUBLESS TROCAR (Supply) IMPLANT
DRAPE C ARM TRNSPAR POLY PROTCT BARR FOR ~~LOC~~ FLROSCP C-ARMOR (Drape) ×1 IMPLANT
DRAPE C-ARM 42 IN X 120 IN (Supply) ×1 IMPLANT
DRESSING TEGADERM 4 X 10IN (Dressing) ×3 IMPLANT
ELECTRODE BLADE MODIFIED 2.5IN (Supply) ×1 IMPLANT
ENDCAP SPNL DIA5.5MM LOK CREO (Implant) ×4 IMPLANT
FILTER NEPTUNE 4PORT MANIFOLD (Supply) ×1 IMPLANT
GLOVE BIOGEL PI MICRO SZ 6.5 (Glove) ×6 IMPLANT
GLOVE SURG GAMMEX DERMAPRENE ULTRA SZ6.5 PF LF (Glove) ×4 IMPLANT
GRAFT BONE SUBSTITUTE 5CC PEPTIDE ENHANCED PUTTY I-FACTOR (Implant) ×1 IMPLANT
GRAFT DBX PUTTY 10CC VOL DEMINERALIZED BONE FRZ DRIED (Tissue) ×1 IMPLANT
KIT CARE PATIENT ORHTO NL (Supply) ×1 IMPLANT
KIT MATRIX HEMOSTATIC SURGIFLO 8ML (Supply) ×2 IMPLANT
Light source ×1 IMPLANT
NEEDLE HYPO SAFETYGLIDE 22G X 1.5IN (Needle) ×1
NEEDLE HYPO SAFETYGLIDE 22G X 1.5IN GREEN (Needle) ×1 IMPLANT
PACK CUSTOM ORTHO SPINE CDS (Pack) ×1 IMPLANT
PACK TOWEL LIGHT BLUE STERILE (Pack) ×2 IMPLANT
PATTIES SURG 1/2 X 1.5IN (Sponge) IMPLANT
PENCIL SMOKE EVAC COATED PB (Supply) ×1 IMPLANT
PILLOW CUSHION INSERT PRONE VW DE-TEC (Supply) ×1 IMPLANT
PROTECTOR ULNA NERVE ~~LOC~~ (Supply) ×2 IMPLANT
ROD TI ALLOY CURVED  5.5MM X 40MM LEN (Rod) ×2 IMPLANT
SCREW CORT MODULAR CREO AMP 6.5-5.5 X 40MM (Screw) ×3 IMPLANT
SCREW CORT MODULAR CREO AMP 6.5-5.5 X 45MM (Screw) ×1 IMPLANT
SLEEVE COMP KNEE HI MED (Supply) ×1 IMPLANT
SOL PREP POVIDONE IODINE 10 PCT 3/4OZ (Solution) ×1 IMPLANT
SOL SODIUM CHLORIDE IRRIG 1000ML BTL (Solution) ×1 IMPLANT
SPONGE SUR W0.25XL9/16IN WHT COT RND KTNR DISECT RADPQ DISP (Supply) ×1 IMPLANT
STAPLER SKIN WIDE STPL LEG L3.9MM WIRE DIA0.58MM FIX HD PROX (Supply) ×1 IMPLANT
SUTR ETHILON MONO 3-0 PS-1 BLACK (Suture) ×2 IMPLANT
SUTR VICRYL ANTIB 1 CT-1 27 VIOLET (Suture) ×2 IMPLANT
SUTR VICRYL ANTIB BRD 2-0 CP-2 18 UNDYED (Suture) ×2 IMPLANT
SYRINGE CONTROL 12CC LUER LOCK (Syringe) ×1 IMPLANT
TUBING NONCONDUC CONN 12FT X 3/16IN (Tubing) ×1 IMPLANT
TULIP POLYAXIAL CREO AMP 5.5MM (Implant) ×4 IMPLANT

## 2022-03-16 NOTE — H&P (Signed)
UPDATES TO PATIENT'S CONDITION on the DAY OF SURGERY/PROCEDURE     I. Updates to Patient's Condition (to be completed by a provider privileged to complete a H&P, following reassessment of the patient by the provider):     Day of Surgery/Procedure Update:  History  History reviewed and no change     Physical  Physical exam updated and no change        II. Procedure Readiness   I have reviewed the patient's H&P and updated condition. By completing and signing this form, I attest that this patient is ready for surgery/procedure.     I explained the risks and benefits of the surgery with the patient.  The risks include infection, nerve root damage, bleeding, paralysis, death, stroke, chronic pain, wound healing problems, need for reoperation, and the need for additional surgery.  I also informed the patient that there was no guarantee that the surgery would be successful.  I answered all the patient's questions to the level of satisfaction.  We will proceed with the operation.        III. Attestation   I have reviewed the updated information regarding the patient's condition and it is appropriate to proceed with the planned surgery/procedure.

## 2022-03-16 NOTE — INTERIM OP NOTE (Signed)
Interim Op Note (Surgical Log ID: [id number])       Date of Surgery: 03/16/2022       Surgeons: Surgeon(s) and Role:     * Evelena Asa, MD - Primary     * Gwinda Passe, MD - Fellow   Assistants:         Pre-op Diagnosis: Pre-Op Diagnosis Codes:     * Lumbar radiculopathy, chronic [M54.16]       Post-op Diagnosis: Post-Op Diagnosis Codes:     * Lumbar radiculopathy, chronic [M54.16]       Procedure(s) Performed: Procedure(s) (LRB):  L4-5 PSF, decompression       Anesthesia Type: General        Fluid Totals: I/O this shift:  09/25 1500 - 09/25 2259  In: 500 (7.1 mL/kg) [I.V.:500]  Out: 100 (1.4 mL/kg) [Blood:100]  Net: 400  Weight: 70 kg        Estimated Blood Loss: 100 mL       Specimens to Pathology:  * No specimens in log *       Temporary Implants:        Packing:                 Patient Condition: good       Findings (Including unexpected complications): As expected, no complications     SignedGwinda Passe, MD  on 03/16/2022 at 4:27 PM    Planned return to OR: none  Antibiotic regimen: ancef x23h  Pain Management: APAP/oxy/tizanidine  DVT/HO prophylaxis: SCDs, ambulation  Weight bearing status: Basic spine precautions (no bending, twisting, heavy lifting). No brace needed.   Pin/Wound Care: dressings per ortho  Further Imaging: none  Additional Specifics: admit to spine, reg diet

## 2022-03-16 NOTE — Progress Notes (Signed)
[place] [facility]   ORTHOPAEDIC SURGERY POST-OP CHECK         Subjective:  No acute events since surgery. VSS. Patient reports pain is controlled. Denies numbness, tingling, nausea, or vomiting. Denies CP/SOB. Has not voided yet.    Objective  Last Filed Vitals    03/16/22 2005   BP: 133/79   Pulse: 83   Resp: 20   Temp: 36.3 C (97.3 F)   SpO2: 97%     General: NAD, Pleasant, Appropriate, AAOx3   Respiratory: Breathing comfortably on RA    Neuro:   Motor Exam:    Right Left   L2 Hip flexors 5 5   L3 Knee extensors 5 5   L4 Ankle dorsiflexors 5 5   L5 Extensor hallucis longus 5 5   S1 Ankle plantar flexors 5 5     Sensory Exam:   Light touch    R L   L1 2 2   L2 2 2   L3 2 2   L4 2 2   L5 2 2   S1 2 2   S2 2 2   S3 2 2   S4 2 2           Assessment/Plan: Gail Jensen is a 61 y.o. female admitted on 03/16/2022, now s/p Procedure(s):  L4-5 PSF    Planned return to OR: none  Antibiotic regimen: ancef x23h  Pain Management: APAP/oxy/tizanidine  DVT/HO prophylaxis: SCDs, ambulation  Weight bearing status: Basic spine precautions (no bending, twisting, heavy lifting). No brace needed.   Pin/Wound Care: dressings per ortho  Further Imaging: none  Additional Specifics: admit to spine, reg diet         Gwinda Passe, MD  Orthopaedic Spine Surgery Fellow  03/16/2022 8:43 PM

## 2022-03-16 NOTE — Anesthesia Case Conclusion (Signed)
CASE CONCLUSION  Emergence  Actions:  Suctioned and extubated  Criteria Used for Airway Removal:  Adequate Tv & RR, acceptable O2 saturation, following commands, strong hand grip, 5 sec head lift and sustained tetany  Assessment:  Routine  Transport  Directly to: PACU  Airway:  Nasal cannula  Oxygen Delivery:  2 lpm  Position:  Supine  Patient Condition on Handoff  Level of Consciousness:  Alert/talking/calm  Patient Condition:  Stable  Handoff Report to:  RN

## 2022-03-16 NOTE — Anesthesia Postprocedure Evaluation (Signed)
Anesthesia Post-Op Note    Patient: Gail Jensen    Procedure(s) Performed:  Procedure Summary  Date:  03/16/2022 Anesthesia Start: 03/16/2022 12:33 PM Anesthesia Stop: 03/16/2022  4:28 PM Room / Location:  [id number] / [facility] MAIN OR   Procedure(s):  L4-5 TLIF Diagnosis:  Lumbar radiculopathy, chronic [M54.16] Surgeon(s):  Evelena Asa, MD  Gwinda Passe, MD Responsible Anesthesia Provider:  Lynann Bologna, MD         Recovery Vitals  BP: 132/69 (03/16/2022  6:15 PM)  Heart Rate: 88 (03/16/2022  6:15 PM)  Heart Rate (via Pulse Ox): 89 (03/16/2022  6:15 PM)  Resp: 23 (03/16/2022  6:15 PM)  Temp: 36.7 C (98.1 F) (03/16/2022  6:00 PM)  SpO2: 98 % (03/16/2022  6:15 PM)  O2 Flow Rate: 2 L/min (03/16/2022  6:15 PM)   0-10 Scale: 5 (03/16/2022  6:00 PM)    Anesthesia type:  general  Complications Noted During Procedure or in PACU:  None, Other   Comment:  Left eye red. Irrigated for comfort in PACU  Patient Location:  PACU  Level of Consciousness:    Alert, oriented and awake  Patient Participation:     Able to participate  Temperature Status:    Normothermic  Oxygen Saturation:    Within patient's normal range  Cardiac Status:   within patient's normal range  Fluid Status:    Stable  Airway Patency:     Yes  Pulmonary Status:    Baseline  Pain Management:    Adequate analgesia  Nausea and Vomiting:  None    Post Op Assessment:    Tolerated procedure well  Responsible Anesthesia Provider Attestation:  All indicated post anesthesia care provided     -

## 2022-03-16 NOTE — Op Note (Addendum)
Operative Note (Surgical Case/Log ID: [id number])       Date of Surgery: 03/16/2022       Surgeons: Moishe Spice) and Role:     * Gail Asa, MD - Primary     Gwinda Passe, MD - Fellow   Assistants:         Pre-op Diagnosis: Pre-Op Diagnosis Codes:     * Lumbar radiculopathy, chronic [M54.16]       Post-op Diagnosis: Post-Op Diagnosis Codes:     * Lumbar radiculopathy, chronic [M54.16]       Procedure(s) Performed: L4-5 revision laminectomy and posterior instrumented spinal fusion  22612, 22840, 20930, 20936, 30865-78       Anesthesia Type: General        Fluid Totals: No intake/output data recorded.       Estimated Blood Loss: 100cc       Specimens to Pathology:  * No specimens in log *       Implants: Globus Creo Cortical screws: right L4 6.5/5.5x79mm, left L4 and bilateral L5 5.5/6.5x9mm  Two 40mm prebent lordotic rods and 4 modular tulip heads, 4 set screws       Packing:  none               Patient Condition: good       Indications: Gail Jensen has persistent left leg pain now for more than 2 months despite multimodal conservative management as indicated in my clinic note (PT, multiple medications, activity modification).  She underwent a left L4-5 hemilaminectomy for the same issue earlier this year which only gave her about 50% pain relief until this recent flareup of left leg pain over the summer.  Leg pain significantly impacted her quality of life and ADLs. She was unable to stand for more than a few minutes due to leg pain. She is interested in operative intervention and I think she is an excellent candidate for L4-5 revision laminectomy and fusion surgery. The risks, benefits and goals of the procedure as well as the expected recovery time were discussed with the patient in detail. Risks include, but are not limited to bleeding, infection, nerve injury, permanent numbness or weakness, persistent pain, CSF leak/dural tear, hardware failure or malpositioning, nonunion, adjacent level degeneration, need for  further surgery, and medical/anesthesia complications. Complications were described in layperson terminology.  I emphasized that since this is a revision surgery there is a higher risk of spinal fluid leak.  Patient has a good understanding and reasonable expectations with regard to expected surgical outcomes, goals of surgery, and recovery time. Patient wishes to proceed with surgery and informed consent was signed.       Findings (Including unexpected complications): Extensive epidural scar tissue in bilateral lateral recesses, unable to place interbody device     Description of Procedure: Patient was seen and identified in the preoperative holding area.  The operative site was marked.  Patient was brought back to the operating room placed under general endotracheal anesthesia without complication.  Foley catheter was placed.  Patient was then transferred into the prone position on the [facility] spine table.  I ensured that all bony prominences were well-padded.  Lumbar region was then shaved, prepped and draped in the usual sterile fashion.  Formal timeout was done prior to the start of the procedure and I confirmed that antibiotics were delivered and that SCDs were on throughout the case for DVT prophylaxis.   I placed a spinal needle in the lower lumbar region and obtained a lateral  x-ray to plan the level of the skin incision.  Skin was sharply incised in the midline centered over the operative level.  Subcutaneous tissues were dissected down using a Bovie to maintain hemostasis.  Identified the lumbar fascia and cleared to each side using a Cobb and Bovie.  I reflected the paraspinal muscles off of the lamina in a subperiosteal manner.  Care was taken to maintain the integrity of the L3-4 facet joint capsules.  I then placed a Woodson elevator at the level of the L5 and L4 pars and obtained a lateral x-ray to confirm I was at the appropriate operative level.  Once I completed dissection on both sides out to the  lateral border of the pars, I placed a deep self-retaining retractor with a light source.  I then turned my attention to placement of the cortical trajectory screws.  Under AP fluoroscopic guidance, I used a high-speed bur to create the entry point at the 5 and 7 o'clock positions on the left and right pedicles respectively at both L4 and L5.  I then transitioned to a lateral x-ray and used a hand drill starting at 25 mm to drill the cortical trajectory screw path.  I increased the drill length and 5 mm increments and intermittently checked the trajectory with a ball-tipped probe ensuring that there is a good bony endpoint and no medial or lateral breaches.  Each screw was then tapped line to line.  The modular screw was then placed under lateral fluoroscopic guidance.  All screws had excellent bony purchase.    I then turned my attention to the decompression portion of the procedure.  I removed the L4 spinous process with a rongeur.  I thinned down the lamina using a high-speed bur.  I entered the spinal canal at the caudal aspect of the L4 lamina using a 2-0 angled curette. I then carried out a central laminectomy using 3mm Kerrisons. There was extensive scar tissue from her previous surgery. The decompression took about twice as long as it normally does because of the scar tissue. I worked on both the right and left lateral recesses and performed a partial medial facetectomy to attempt to localize the disc space but I was unable to safely mobilize the L5 nerve root to access the ventral portion of the canal and enter the disc space due to scar tissue around the L5 nerve roots in the lateral recesses.  I then used a Public house manager to palpate the medial borders of the L4 and L5 pedicles and ensured there was no further bony stenosis in the lateral recesses. I left scar tissue adherent to the thecal sac dorsally to avoid incidental durotomy but felt confident that I achieved adequate lateral recess decompression.      Modular heads were placed on the cortical screws and they were advanced into final position.  Pre-bent lordotic rods were placed in the tulip heads and set screws were placed and final-tightened.  I ensured adequate hemostasis prior to closure. I decorticated what was left of the L4-5 facet joint on the left and placed a mixture of local allograft, I-factor and DBX putty for posterior fusion.  Deep self-retaining retractor was removed. I obtained final AP and lateral x-rays to confirm appropriate placement of all hardware.  I then closed the fascia with running and interrupted #1 vicryl suture.  Subcutaneous tissues were closed with interrupted 2-0 vicryl and skin was closed with interrupted 3-0 nylon sutures.  I washed the skin with betadine solution.  Sterile dressings were placed.  Marcaine was used for local anesthetic.  Patient was then gently transferred back into the supine position on the hospital bed.  Patient was extubated without complication.    All counts were correct x2 at the completion of the procedure.  As the attending surgeon of record I attest that I was present for the entire operation and there were no complications.     Signed:  Evelena Asa, MD  on 03/16/2022 at 3:45 PM

## 2022-03-16 NOTE — Progress Notes (Signed)
SUBJECTIVE: The patient suffered a left corneal desiccation 2 hour(s) ago.     OBJECTIVE: She appears well, vitals are normal. Corneal abrasion noted. PERLA, fundi normal. Visual acuity baseline.    ASSESSMENT: Corneal abrasion    PLAN: tetracaine eyedrops applied, ointment ordered, counseled patient that irritation should self-resolve, but to contact her surgical team if discomfort persists.     Carol Ada, MD  Anesthesia PGY 4  03/16/22  6:11 PM

## 2022-03-16 NOTE — Anesthesia Procedure Notes (Signed)
---------------------------------------------------------------------------------------------------------------------------------------    AIRWAY   GENERAL INFORMATION AND STAFF    Patient location during procedure: OR       Date of Procedure: 03/16/2022 1:08 PM  CONDITION PRIOR TO MANIPULATION     Current Airway/Neck Condition:  Normal        For more airway physical exam details, see Anesthesia PreOp Evaluation  AIRWAY METHOD     Patient Position:  Sniffing    Preoxygenated: yes      Maintained In-Line Stability: not needed, normal c-spine condition          To see details of medications used, see MAR    Induction: IV, NDMR and Routine    Mask Difficulty Assessment:  1 - vent by mask      Mask NMB: 1 - vent by mask      Technique Used for Successful ETT Placement:  Direct laryngoscopy    Devices/Methods Used in Placement:  Intubating stylet    Blade Type:  Macintosh    Laryngoscope Blade/Video laryngoscope Blade Size:  3    Cormack-Lehane Classification:  Grade IIb - view of arytenoids or posterior of glottis only    Placement Verified by: capnometry, auscultation and equal breath sounds      Number of Attempts at Approach:  1  FINAL AIRWAY DETAILS    Final Airway Type:  Endotracheal airway    Final Endotracheal Airway:  ETT      Cuffed: cuffed    Insertion Site:  Oral    ETT Size (mm):  7.0    Cuff Volume (mL):  6    Distance inserted from Lips (cm):  21  ----------------------------------------------------------------------------------------------------------------------------------------

## 2022-03-17 LAB — CBC AND DIFFERENTIAL
Baso # K/uL: 0 10*3/uL (ref 0.0–0.2)
Basophil %: 0.1 %
Eos # K/uL: 0 10*3/uL (ref 0.0–0.5)
Eosinophil %: 0 %
Hematocrit: 38 % (ref 34–49)
Hemoglobin: 12.8 g/dL (ref 11.2–16.0)
IMM Granulocytes #: 0 10*3/uL (ref 0.0–0.0)
IMM Granulocytes: 0.4 %
Lymph # K/uL: 0.9 10*3/uL — ABNORMAL LOW (ref 1.0–5.0)
Lymphocyte %: 8.9 %
MCH: 31 pg (ref 26–32)
MCHC: 34 g/dL (ref 32–36)
MCV: 93 fL (ref 75–100)
Mono # K/uL: 0.3 10*3/uL (ref 0.1–1.0)
Monocyte %: 3.2 %
Neut # K/uL: 8.8 10*3/uL — ABNORMAL HIGH (ref 1.5–6.5)
Nucl RBC # K/uL: 0 10*3/uL (ref 0.0–0.0)
Nucl RBC %: 0 /100 WBC (ref 0.0–0.2)
Platelets: 243 10*3/uL (ref 150–450)
RBC: 4.1 MIL/uL (ref 4.0–5.5)
RDW: 12.1 % (ref 0.0–15.0)
Seg Neut %: 87.4 %
WBC: 10.1 10*3/uL (ref 3.5–11.0)

## 2022-03-17 LAB — BASIC METABOLIC PANEL
Anion Gap: 10 (ref 7–16)
CO2: 27 mmol/L (ref 20–28)
Calcium: 8.7 mg/dL (ref 8.6–10.2)
Chloride: 103 mmol/L (ref 96–108)
Creatinine: 0.75 mg/dL (ref 0.51–0.95)
Glucose: 177 mg/dL — ABNORMAL HIGH (ref 60–99)
Lab: 15 mg/dL (ref 6–20)
Potassium: 4.1 mmol/L (ref 3.3–5.1)
Sodium: 140 mmol/L (ref 133–145)
eGFR BY CREAT: 91 *

## 2022-03-17 MED ORDER — PANTOPRAZOLE SODIUM 40 MG PO TBEC *I*
DELAYED_RELEASE_TABLET | ORAL | Status: AC
Start: 2022-03-17 — End: 2022-03-17
  Filled 2022-03-17: qty 1

## 2022-03-17 MED ORDER — OXYCODONE HCL 5 MG PO TABS *I*
5.0000 mg | ORAL_TABLET | ORAL | Status: DC | PRN
Start: 2022-03-17 — End: 2022-03-18

## 2022-03-17 MED ORDER — OXYCODONE HCL 10 MG PO TABS *I*
ORAL_TABLET | ORAL | Status: AC
Start: 2022-03-17 — End: 2022-03-17
  Filled 2022-03-17: qty 1

## 2022-03-17 MED ORDER — HYDROMORPHONE HCL PF 1 MG/ML IJ SOLN *WRAPPED*
0.5000 mg | Freq: Once | INTRAMUSCULAR | Status: AC
Start: 2022-03-17 — End: 2022-03-17
  Administered 2022-03-17: 0.5 mg via INTRAVENOUS

## 2022-03-17 MED ORDER — SENNOSIDES 8.6 MG PO TABS *I*
ORAL_TABLET | ORAL | Status: AC
Start: 2022-03-17 — End: 2022-03-17
  Filled 2022-03-17: qty 2

## 2022-03-17 MED ORDER — HYDROMORPHONE HCL PF 1 MG/ML IJ SOLN *WRAPPED*
INTRAMUSCULAR | Status: AC
Start: 2022-03-17 — End: 2022-03-17
  Filled 2022-03-17: qty 0.5

## 2022-03-17 MED ORDER — ACETAMINOPHEN 500 MG PO TABS *I*
ORAL_TABLET | ORAL | Status: AC
Start: 2022-03-17 — End: 2022-03-17
  Filled 2022-03-17: qty 2

## 2022-03-17 MED ORDER — OXYCODONE HCL 10 MG PO TABS *I*
10.0000 mg | ORAL_TABLET | ORAL | Status: DC | PRN
Start: 2022-03-17 — End: 2022-03-18
  Administered 2022-03-17 – 2022-03-18 (×4): 10 mg via ORAL

## 2022-03-17 MED ORDER — DOCUSATE SODIUM 100 MG PO CAPS *I*
ORAL_CAPSULE | ORAL | Status: AC
Start: 2022-03-17 — End: 2022-03-17
  Filled 2022-03-17: qty 2

## 2022-03-17 MED ORDER — DEXAMETHASONE SOD PHOSPHATE PF 10 MG/ML IJ SOLN *I*
10.0000 mg | Freq: Three times a day (TID) | INTRAMUSCULAR | Status: AC
Start: 2022-03-17 — End: 2022-03-18
  Administered 2022-03-17 – 2022-03-18 (×3): 10 mg via INTRAVENOUS
  Filled 2022-03-17 (×3): qty 1

## 2022-03-17 MED ORDER — TRAZODONE HCL 50 MG PO TABS *I*
50.0000 mg | ORAL_TABLET | Freq: Every evening | ORAL | Status: DC
Start: 2022-03-17 — End: 2022-03-18
  Administered 2022-03-17: 50 mg via ORAL
  Filled 2022-03-17: qty 1

## 2022-03-17 MED ORDER — CEFAZOLIN 2000 MG IN STERILE WATER 20ML SYRINGE *I*
PREFILLED_SYRINGE | INTRAVENOUS | Status: AC
Start: 2022-03-17 — End: 2022-03-17
  Filled 2022-03-17: qty 20

## 2022-03-17 MED ORDER — DIPHENHYDRAMINE HCL 25 MG ORAL SOLID *WRAPPED*
25.0000 mg | Freq: Once | ORAL | Status: AC
Start: 2022-03-17 — End: 2022-03-17
  Administered 2022-03-17: 25 mg via ORAL

## 2022-03-17 MED ORDER — DIPHENHYDRAMINE HCL 25 MG ORAL SOLID *WRAPPED*
ORAL | Status: AC
Start: 2022-03-17 — End: 2022-03-17
  Filled 2022-03-17: qty 1

## 2022-03-17 NOTE — Plan of Care (Signed)
Problem: Impaired Bed Mobility  Goal: STG - IMPROVE BED MOBILITY  Note: Patient will perform bed mobility without rails and the head of bed flat with No assist (Independently)     Time frame: 1-3 Visits     Problem: Impaired Transfers  Goal: STG - IMPROVE TRANSFERS  Note: Patient will complete Sit to stand transfers using no device with No assist (Independently)     Time frame: 1-3 Visits     Problem: Impaired Ambulation  Goal: STG - IMPROVE AMBULATION  Note: Patient will ambulate 150-299 feet using no device with No assist (Independently)    Time frame: 1-3 Visits       Problem: Impaired Stair Navigation  Goal: STG - IMPROVE STAIR NAVIGATION  Note: Patient will navigate greater than 12 steps with 1  rail(s) and no device and No assist (Independently)     Time frame: 1-3 Visits

## 2022-03-17 NOTE — Progress Notes (Signed)
Occupational Therapy Evaluation        Discharge Recommendations:  Prior Living Environment, Assist with IADLs - Pt demonstrates modified independence with ADLs. Pt may benefit from sister's assistance with IADLs as needed.    OT Discharge Equipment Recommended: (P)  (Pt has adequate equipment setup at home.)     Hospital Stay Recommendations:  Encourage active participation with ADLs, Toilet transfer status independent, and OOB for meals    OT Referral Recommendations : (P)  (N/A)        HPI:  Admitting Dx: Active Hospital Problems    *Lumbar radiculopathy        PMH: History reviewed. No pertinent past medical history.    PSH:   Past Surgical History:   Procedure Laterality Date    BREAST ENHANCEMENT SURGERY Bilateral 11/1998    CESAREAN SECTION, UNSPECIFIED  1996    LAMINECTOMY  08/2021       ASSESSMENT       03/17/22 0906   Visit Details [facility])   Visit Type ([facility]) Eval-General   Tx Prioritization 4 - Normal   [facility] OT Tracking   [facility] OT Tracking OT Discontinue   Current Pain Assessment   Pain Assessment / Reassessment Assessment    0-10 Scale 7   Pain Comments Pt reports 4/10 pain in low back at rest, increasing up to 7/10 with coughing and movement. Refer to RN for pain mgmt.   Plan and Onset date   Plan of Care Date 03/17/22   Onset Date 03/16/22   Treatment Start Date 03/17/22   OT Last Visit   Visit (#)  1   Precautions   Precautions used Yes   Fall Precautions General falls precautions   Spinal Precautions Yes   LDA Observation IV lines   Patient Wearing Mask No   Writer wearing PPE including Gloves;Mask   Vitals Vital signs stable   Activity Order Activity as tolerated   Home Living (Prior to Admission)   Prior Living Situation Reported by patient   Type of Home 2 Story home   # Steps to Enter Home 4   # Of Steps In Home 9   Bathroom Shower/Tub Tub/Shower unit   Current Home Equipment Transfer tub bench;Toilet seat (raised w/arms);Walker (rolling);Cane (straight)   Additional Comments Above reflects pt's  home however, pt reports she will be staying at sister's house this weekend after d/c and before going to her home next week. Her sister lives in a 2 story home with 2 steps to enter, 12 steps to second floor and a tub shower. Pt's sister works from home and will be available to assist pt as needed upon return home.   Prior Function   Prior Function Reported by patient   Level of Independence Independent with ADLs;Independent ambulation;Independent with ADL functional transfers;Driving;Independent with homemaking with ambulation   Lives With Alone   Receives Help From Independent   IADL Independent   Additional Comments Pt reports 15 minute endurance limit for standing activity prior to surgery. At true baseline, pt is independent with ADLs and IADLs.   Vision    Current Vision Wears corrective lenses  (Pt denies changes in vision from baseline, able to read writer's badge and clock on wall ok.)   Cognition   Cognition No deficit noted   Additional Comments Pt is pleasant and agreeable to therapy, in NAD. Pt demonstrates good awareness, insight and safety throughout functional activity. Good recall of spinal precautions.   Medication Management   Medication  Management System Yes   Uses Pill box   Perception   Perception No deficit noted  (No apparent deficit noted, pt discerns right from left.)   Coordination   Coordination Additional Comments  [facility] as demonstrated during functional activity.)   Sensation   Sensation Deficit noted   Additional Comments Pt reports numbness/tingling in toes since surgery, providers aware.   UE Assessment   UE Assessment Full AROM RUE;Full AROM LUE   Additional Comments Strength WFL bilaterally, assessed during functional activity.   Bed Mobility   Supine to Sit Modified Independent;HOB flat  (log roll)   Functional Transfers   Sit to Stand Independent   Stand to Sit Independent   Functional Mobility Modified Independent  (pt. wall surfing while ambulating to bathroom, no LOB noted.  Declines use of cane for stabilization. Reports she has a Licensed conveyancer at home for use if needed.)   Additional Comments Pt completes toilet transfer with modified independence to standard height toilet with use of grab bars. Pt has raised toilet seat with arms at home.   Balance   Sitting - Static Independent ;Unsupported;Supported   Sitting - Solicitor;Unsupported   Standing - Static Independent;Unsupported   Standing - Dynamic Independent;Unsupported   ADL Assessment   Eating Independent   Oral Care Independent  (clinical estimate)   Grooming Independent  (clinical estimate)   UE Dressing Independent   LE Dressing Modified independent  (Education provided for donning socks and pants within limitations of spinal precautions. Pt demonstrates independence after education.)   Bathing Modified independence  (clinical estimate with tub transfer bench and shower in seated)   Toileting Modified independence  (simulated - education provided on peri hygiene technique within limits of spinal precautions. Pt demonstrates understanding after education.)   Additional Comments Pt educated on availability of AE for dressing should she need it in the future. However, anticipate pt will continue with modified independence without use of AE as demonstrated during today's session.   Activity Tolerance   Endurance Endurance does not limit participation in activity   OT Functional Outcome Measures   Functional Outcome Measures Yes   OT AM-PAC Self Care   Putting on and taking off regular lower body clothing? 4   Bathing (including washing, rinsing, drying)? 4   Toileting, which includes using toilet, bedpan, or urinal? 4   Putting on and taking off regular upper body clothing? 4   Taking care of personal grooming such as brushing teeth? 4   Eating Meals 4   Total Raw Score 24   CMS Score - Calculated 0.00%   Plan   OT Frequency OT Discontinue   No acute OT needs Pt demonstrates adequate ADL skills for return to prior living  environment   Multidisciplinary Communication   Multidisciplinary Communication RN, pt.   Recommendation   OT Discharge Recommendations Prior Living Environment;Intermittent supervision  (assist from sister as needed)   OT Referral Recommendations    (N/A)   OT Discharge Equipment Recommended   (Pt has adequate equipment setup at home.)   OT Additional Comments Pt demonstrates modified independence with ADLs. Pt may benefit from sister's assistance with IADLs as needed.   OT Hospital Stay Recommendations Encourage independence with ADLs.   OT needs to see patient prior to DC  No          OCCUPATIONAL THERAPY PROVIDER     Electronically Signed By:   Judge Stall, OT    Please contact the OT via Secure Chat to:  [facility] Occupational Therapy First Call with any questions/concerns and/or update requests.    Timed Calculations:  Timed Codes:  0  Untimed Codes: 24 min x OT Eval  Unbilled Time: 0  Total Time:  24 min     OT EVALUATION COMPLEXITY     1.  Occupational Profile & History (Medical/Therapy)   Moderate (Expanded Review)    2.  Public relations account executive (Includes occupations from: ADL, IADL, Rest/Sleep, Education, Work, Copywriter, advertising, Leisure, Social Participation)   Low (1-3 occupations/performance deficits)    3.  Clinical Decision Making   i  Assessment    Moderate (Detailed)   ii  Co-Morbidities    Low (None)   iii  Modifications    Low (None)   iv Treatment Options (Approaches include: Create, Promote, Establish, Restore, Maintain, Modify, Prevent)    Low (Limited)     Low    4.  EVALUATION COMPLEXITY as based on the above provided information   Low

## 2022-03-17 NOTE — Progress Notes (Signed)
Orthopaedic Spine Surgery Progress Note  AVW:[id number] DOB:[birth year]   DOA: 03/16/2022   LOS: 0 days  Full Code       Subjective:  Painful overnight in low back and R thight. VSS. Tolerating PO full diet. Voiding spontaneously. No numbness/tingling. Denies nausea/vomiting, fever, chills, SOB/CP. Dressing with drainage, changed overnight.       Objective:    Vital Signs    Current Vitals Vitals Range (24 hours)   BP 108/65 (BP Location: Left arm)   Pulse 85   Temp 36.5 C (97.7 F) (Temporal)   Resp 16   Ht 1.72 m (5' 7.72")   Wt 70 kg (154 lb 5.2 oz)   SpO2 97%   BMI 23.66 kg/m  BP: (108-137)/(62-107)   Temp:  [35.8 C (96.4 F)-36.7 C (98.1 F)]   Temp src: Temporal (09/26 0727)  Heart Rate:  [79-106]   Resp:  [10-23]   SpO2:  [94 %-100 %]   Height:  [172 cm (5' 7.72")]   Weight:  [70 kg (154 lb 5.2 oz)]        Physical Exam:  General: Alert, resting comfortably in bed in NAD  Skin: Posterior dressing clean, dry and intact.  Neuro:  Motor Exam:    Right Left   L2 Hip flexors 5 5   L3 Knee extensors 5 5   L4 Ankle dorsiflexors 5 5   L5 Extensor hallucis longus 5 5   S1 Ankle plantar flexors 5 5     Sensory Exam:   Light touch    R L   L1 2 2   L2 2 2   L3 2 2   L4 2 2   L5 2 2   S1 2 2   S2 2 2   S3 2 2   S4 2 2           Laboratory Studies  Recent Labs   Lab 03/16/22  2359 03/11/22  1335   Sodium 140 139   Potassium 4.1 4.7   Chloride 103 101   CO2 27 29*   UN 15 16   Creatinine 0.75 0.92   Glucose 177* 95   Calcium 8.7 9.9    Recent Labs   Lab 03/16/22  2359 03/11/22  1335   WBC 10.1 5.8   Hemoglobin 12.8 14.1   Hematocrit 38 43   Platelets 243 262    Recent Labs   Lab 03/11/22  1335   Bilirubin,Total 0.4   AST 27   ALT 27   Alk Phos 58   Total Protein 7.1   Albumin 4.8           Microbiology:  none    Imaging:  none    Drain output:  none    Assessment/Plan: Gail Jensen is a 61 y.o. female with lumbar stenosis, now POD 1 s/p L4-5 posterior decompression, PSF    Planned return to OR: none  Antibiotic  regimen: ancef x23h  Pain Management: APAP/oxy/tizanidine  DVT/HO prophylaxis: SCDs, ambulation  Weight bearing status: Basic spine precautions (no bending, twisting, heavy lifting). No brace needed.   Pin/Wound Care: dressings per ortho  Further Imaging: none  Additional Specifics: will give decadron for leg symptom, dispo pending pain control, PT    Mansi Tokar, MD  Spine Fellow, Orthopaedic Surgery  03/17/2022 at 7:34 AM

## 2022-03-17 NOTE — Progress Notes (Signed)
Physical Therapy Initial Evaluation    Therapy Recommendations:  Discharge Recommendations:  Discharge to Planned Living Arrangement with Caregiver Assistance:     Patient's mobility is currently a barrier to discharge and further PT visits will be needed. .   Recommendations:   PT Discharge Equipment Recommended: None   Additional justification:   N/A     PT Mobility Recommendations: SBA amb ad lib  PT Referral Recommendations: OT    History of Present Admission: Gail Jensen is a 61 y.o. female with lumbar stenosis, now POD 1 s/p L4-5 posterior decompression, PSF     History reviewed. No pertinent past medical history.     Past Surgical History:   Procedure Laterality Date    BREAST ENHANCEMENT SURGERY Bilateral 11/1998    CESAREAN SECTION, UNSPECIFIED  1996    LAMINECTOMY  08/2021       Personal factors affecting treatment/recovery:  None identified    Comorbidities affecting treatment/recovery:  none noted    Clinical presentation:  evolving due to: pain and lightheadedness with OOB activity limiting mobility this date     Patient complexity:    low level as indicated by above stability of condition, personal factors, environmental factors and comorbidities in addition to their impairments found on physical exam.       03/17/22 0810   Prior Living    Prior Living Situation Reported by patient   Lives With Alone   Type of Home 2 Story Home   # Steps to Enter Home 4   # Rails to [employer] Home 2   # Of Steps In Home 12   # Rails in Home 1   Location of Bedrooms 2nd floor   Location of Bathrooms 1st floor;2nd floor   Bathroom Accessibility Tub/Shower unit   Current Home Equipment Toilet seat (raised w/arms);Walker (rolling);Cane (straight);Transfer tub bench   Additional Comments patient is going to stay at her sisters house until the weekend, sister has [facility] with 2 STE with no HR, flight to second floor where bed/bathroom is however she can stay on first floor on couch with half bath if needed. sisters house also  has raised toilet set and shower chair. then sister is going to work from home at the patients house when she returns home   Prior Function Level   Prior Function Level Reported by patient   Transfers Teacher, English as a foreign language None   Walking Independent   Walking assistive devices used None   Stair negotiation Independent   History of Falls No   Additional Comments patient states she would bring [facility] with her when she knew she would be walking far distances/be on her feet for awhile   PT Tracking   [facility]) PT TRACKING PT Assigned   Visit Number   Visit Number [facility]) / Treatment Day (HH) 1   Visit Details [facility])   Visit Type [facility] Evaluation   Precautions/Observations   Precautions used Yes   Fall Precautions General falls precautions   Activity Orders Present Yes   LDA Observation None   Vital Signs Response with Therapy stable - states slight lightheadedness due to pain medications   Was patient wearing a mask? No   PPE worn by Clinical research associate [facility]   Patient Subjective pt agreeable to skilled PT   Current Pain Assessment   Pain Assessment / Reassessment Assessment   Pain Scale 0-10 (Numeric Scale for Pain Intensity)    0-10 Scale 7   Pain Location/Orientation Back;Incision   Pain Descriptors Sore;Sharp  Vision    Current Vision Adequate for PT session   Communication   Communication Communication Style   Communication Style Verbal   Cognition   Cognition Tested   Arousal/Alertness Appropriate responses to stimuli   Orientation A&Ox4   Ability to Follow Instructions Follows all commands and directions without difficulty   UE Assessment   Assessment Focus   (BUE ROM and strength WFL)   LE Assessment   Assessment Focus   (BLE ROM and strength WFL 4+/5 MMT)   Sensation   Sensation Deficit noted   Light Touch Impaired (comment)   Additional Comments decreased sensation over R toes and medial ankle compared to LLE   Bed Mobility   Bed mobility Not tested   Additional comments patient seated up at edge of bed at start and  end of session   Transfers   Transfers Tested   Sit to Stand Contact guard   Stand to sit Contact guard   Transfer Assistive Device none   Additional comments STS from EOBx2, CGA with VC to scoot to edge o bed, engage core, push through BLE to come to stance and avoid hoding breath. pt sttaes most pain with these transition movements however no overt LOB noted. VC to step by until she feels the bed on posterior aspect of BLE and reach back to sit down at end of session. pt left seated EOB with call bell in reach and all needs met.   Mobility   Mobility: Gait/Stairs Tested   Gait Pattern 2 point;Decreased cadence;Decreased R step length;Decreased R step height   Ambulation Assist Stand by   Ambulation Distance (Feet) 100'x2   Ambulation Assistive Device None   Stairs Assistance Stand by   Stair Management Technique One rail;Step to pattern   Number of Stairs 7+7   Additional comments patient tolerated amb to stairs and back, increased time to complete task due to pain and slight lightheadedness. patient demonstrates short BLE step height and length with BUE in high guard position throughout with minimal arm swing. no overt LOB noted during amb with improved fluidity of gait noted at end of session. pt able to ascend/descend 7+7 steps with 1 HR with increased time and SBA. VC for sequencing to ascend with LLE and descend with RLE. pt tolerated well however needed standing breaks throughout.   Training and Education   Patient pt edu on role of skilled PT, safety with all mobility, basic spinal precautions, and d/c planning   Balance   Balance Tested   Sitting - Static Independent   Sitting - Dynamic Supervision   Standing - Static Standby assist   Standing - Dynamic Standby assist;Contact guard   Functional Outcome Measures   Functional Outcome Measures Yes   PT AM-PAC Mobility   Turning over in bed? 4   Moving from lying on back to sitting on the side of the bed? 4   Moving to and from a bed to a chair? 3   Sitting  down on and standing up from a chair with arms? 3   Need to walk in hospital room? 3   Climbing 3 - 5 steps with a railing? 3   Total Raw Score 20   AM-PAC T-Scale Score 43.99   Cumulative Ambulation Score (CAM)   Get in and out of bed 2   Sit to stand, stand to sit from chair 1   Walking 1   Total CAM Score (max 6) 4   Assessment   Brief  Assessment Appropriate for skilled therapy   Problem List Impaired endurance;Impaired transfers;Impaired ambulation;Impaired bed mobility;Impaired stair navigation;Pain contributing to impairment;Impaired functional mobility   Patient / Family Goal decrease pain, get home   Overall Assessment patient is currently functioning below baseline level of mobility at this time due to increased pain with mobility, decreased endurance and lightheadedness with OOB activity this date. Anticipate patient to progress well towards home d/c once medically ready, with additional PT sessions to maximize safety and independence in mobility. Will continue to progress patient as able.    Plan/Recommendation   PT Treatment Interventions Bed mobility training;Transfers training;Stair training;Pt/Family education;Gait training;Will work to minimize pain while promoting mobility whenever possible;D/C planning;Family Training;Home exercise program instruction   PT Frequency 3-5 x/wk   PT Mobility Recommendations SBA amb ad lib   PT Referral Recommendations OT   PT Discharge Recommendations Anticipate return to prior living arrangement;Intermittent supervision/assist   PT Discharge Equipment Recommended None   Transportation Recommendations any   PT Assessment/Recommendations Reviewed With: Patient;Nursing;Advanced Practice Provider;Physician   Next PT Visit assess bed mobility from flat bed, progess transfers, amb and stairs as able for d/c   PT needs to see patient prior to DC  Yes   Time Calculation   Total Time Therapeutic Activities (minutes) 0   Total Time Gait Training (minutes) 0   Total Time  Therapeutic Exercises (minutes) 0   Total Time Neuromuscular Re-education (minutes) 0   Total Time Group Therapy (minutes) 0   PT Timed Codes 0   PT Untimed Codes 38   PT Unbilled Time 0   PT Total Treatment 38   Plan and Onset date   Plan of Care Date 03/17/22   Onset Date 03/16/22   Treatment Start Date 03/17/22     Lauris Poag, PT, DPT    Please contact physical therapist on the treatment team via secure chat or via the secure chat group for your unit Physical Therapist with any questions.  On weekends, please utilize the secure chat group, [facility] Physical Therapy 1st call, to contact PT.

## 2022-03-17 NOTE — Plan of Care (Signed)
Problem: Impaired ADL  Goal: Increase ADL Independence  Outcome: Completed or Resolved  Note: BATHING - Patient will complete upper body bathing with tub transfer bench and modified independence on shower/tub bench - Time Frame: 1 Visit - MET  LOWER BODY DRESSING - Patient will complete lower body dressing with no device and modified independence at edge of bed - Time Frame: 1 Visit - MET  TOILETING - Patient will complete toileting with no device and modified independence on standard toilet - Time Frame: 1 Visit - MET     Kylia Grajales, OTR/L    Please contact the OT via Secure Chat to: [facility] Occupational Therapy First Call with any questions/concerns and/or update requests.

## 2022-03-17 NOTE — Progress Notes (Signed)
Patient seen in 23 hour unit - ambulating with her sister. Dressing is clean and dry-  Pain control is improving.    Plan for home in am.    Estevan Ryder PA  Orthopaedic Spine Team    [phone] UR Iphone  [phone] Cell

## 2022-03-18 ENCOUNTER — Encounter: Payer: Self-pay | Admitting: Orthopaedic Surgery

## 2022-03-18 ENCOUNTER — Encounter: Payer: Self-pay | Admitting: Family Medicine

## 2022-03-18 LAB — TESTOSTERONE, FREE (ADULT FEMALES, CHILDREN, OR INDIVIDUALS ON TESTOSTERONE SUPPRESSION THERAPY)
TESTOSTERONE,FREE: 0.32 ng/dL (ref 0.13–0.90)
Testosterone,Total: 14 ng/dL (ref 8–60)

## 2022-03-18 LAB — CBC AND DIFFERENTIAL
Baso # K/uL: 0 10*3/uL (ref 0.0–0.2)
Basophil %: 0.1 %
Eos # K/uL: 0 10*3/uL (ref 0.0–0.5)
Eosinophil %: 0 %
Hematocrit: 36 % (ref 34–49)
Hemoglobin: 11.7 g/dL (ref 11.2–16.0)
IMM Granulocytes #: 0.1 10*3/uL — ABNORMAL HIGH (ref 0.0–0.0)
IMM Granulocytes: 0.4 %
Lymph # K/uL: 1 10*3/uL (ref 1.0–5.0)
Lymphocyte %: 8.6 %
MCH: 31 pg (ref 26–32)
MCHC: 33 g/dL (ref 32–36)
MCV: 94 fL (ref 75–100)
Mono # K/uL: 0.5 10*3/uL (ref 0.1–1.0)
Monocyte %: 3.8 %
Neut # K/uL: 10.2 10*3/uL — ABNORMAL HIGH (ref 1.5–6.5)
Nucl RBC # K/uL: 0 10*3/uL (ref 0.0–0.0)
Nucl RBC %: 0 /100 WBC (ref 0.0–0.2)
Platelets: 235 10*3/uL (ref 150–450)
RBC: 3.8 MIL/uL — ABNORMAL LOW (ref 4.0–5.5)
RDW: 12.2 % (ref 0.0–15.0)
Seg Neut %: 87.1 %
WBC: 11.7 10*3/uL — ABNORMAL HIGH (ref 3.5–11.0)

## 2022-03-18 MED ORDER — DEXAMETHASONE SODIUM PHOSPHATE 4 MG/ML INJ SOLN *WRAPPED*
INTRAMUSCULAR | Status: AC
Start: 2022-03-18 — End: 2022-03-18
  Filled 2022-03-18: qty 3

## 2022-03-18 MED ORDER — ACETAMINOPHEN 500 MG PO TABS *I*
ORAL_TABLET | ORAL | Status: AC
Start: 2022-03-18 — End: 2022-03-18
  Filled 2022-03-18: qty 2

## 2022-03-18 MED ORDER — SENNOSIDES 8.6 MG PO TABS *I*
ORAL_TABLET | ORAL | Status: AC
Start: 2022-03-18 — End: 2022-03-18
  Filled 2022-03-18: qty 2

## 2022-03-18 MED ORDER — OXYCODONE HCL 10 MG PO TABS *I*
10.0000 mg | ORAL_TABLET | ORAL | 0 refills | Status: DC | PRN
Start: 2022-03-18 — End: 2022-04-02

## 2022-03-18 MED ORDER — ACETAMINOPHEN 500 MG PO TABS *I*
1000.0000 mg | ORAL_TABLET | Freq: Three times a day (TID) | ORAL | 0 refills | Status: DC
Start: 2022-03-18 — End: 2023-03-02

## 2022-03-18 MED ORDER — OXYCODONE HCL 10 MG PO TABS *I*
ORAL_TABLET | ORAL | Status: AC
Start: 2022-03-18 — End: 2022-03-18
  Filled 2022-03-18: qty 1

## 2022-03-18 MED ORDER — PANTOPRAZOLE SODIUM 40 MG PO TBEC *I*
DELAYED_RELEASE_TABLET | ORAL | Status: AC
Start: 2022-03-18 — End: 2022-03-18
  Filled 2022-03-18: qty 1

## 2022-03-18 MED ORDER — HYDROMORPHONE HCL PF 1 MG/ML IJ SOLN *WRAPPED*
INTRAMUSCULAR | Status: AC
Start: 2022-03-18 — End: 2022-03-18
  Filled 2022-03-18: qty 0.5

## 2022-03-18 NOTE — Progress Notes (Signed)
Orthopaedic Spine Surgery Progress Note  ZOX:[id number] DOB:12-09-1960   DOA: 03/16/2022   LOS: 0 days  Full Code       Subjective:  NAEO. VSS. Reports pain control is much improved this AM. Tolerating PO full diet. Voiding spontaneously. No new numbness/tingling. Reports right foot numbness and tingling is improving. Denies nausea/vomiting, fever, chills, SOB/CP.OOB ambulating with PT. Requires additional sessions.       Objective:    Vital Signs    Current Vitals Vitals Range (24 hours)   BP 122/74 (BP Location: Left arm)   Pulse 73   Temp 36.3 C (97.3 F) (Temporal)   Resp 16   Ht 1.72 m (5' 7.72")   Wt 70 kg (154 lb 5.2 oz)   SpO2 96%   BMI 23.66 kg/m  BP: (108-139)/(65-81)   Temp:  [36.3 C (97.3 F)-36.7 C (98.1 F)]   Temp src: Temporal (09/27 0612)  Heart Rate:  [55-85]   Resp:  [15-16]   SpO2:  [95 %-97 %]        Physical Exam:  General: Alert, resting comfortably in bed in NAD  Skin: Posterior dressing clean, dry and intact.  Neuro:  Motor Exam:    Right Left   L2 Hip flexors 5 5   L3 Knee extensors 5 5   L4 Ankle dorsiflexors 5 5   L5 Extensor hallucis longus 5 5   S1 Ankle plantar flexors 5 5     Sensory Exam:   Light touch    R L   L1 2 2   L2 2 2   L3 2 2   L4 2 2   L5 2 2   S1 2 2   S2 2 2   S3 2 2   S4 2 2       Superficial dressing c/d/I without strikethrough  Deep dressing appears saturated distally.     Laboratory Studies  Recent Labs   Lab 03/16/22  2359 03/11/22  1335   Sodium 140 139   Potassium 4.1 4.7   Chloride 103 101   CO2 27 29*   UN 15 16   Creatinine 0.75 0.92   Glucose 177* 95   Calcium 8.7 9.9      Recent Labs   Lab 03/18/22  0000 03/16/22  2359 03/11/22  1335   WBC 11.7* 10.1 5.8   Hemoglobin 11.7 12.8 14.1   Hematocrit 36 38 43   Platelets 235 243 262      Recent Labs   Lab 03/11/22  1335   Bilirubin,Total 0.4   AST 27   ALT 27   Alk Phos 58   Total Protein 7.1   Albumin 4.8             Microbiology:  none    Imaging:  none    Drain output:  none    Assessment/Plan:  Gail Jensen is a 61 y.o. female with lumbar stenosis, now POD 2 s/p L4-5 posterior decompression, PSF    Planned return to OR: none  Antibiotic regimen: ancef x23h  Pain Management: APAP/oxy/tizanidine  DVT/HO prophylaxis: SCDs, ambulation  Weight bearing status: Basic spine precautions (no bending, twisting, heavy lifting). No brace needed.   Pin/Wound Care: dressings per ortho  Further Imaging: none  Additional Specifics: will give decadron for leg symptom, dispo pending pain control, PT    Wynn Banker, MD  Orthopedic Surgery Resident  6:24 AM 03/18/22

## 2022-03-18 NOTE — Progress Notes (Signed)
Patient states readiness for discharge.  IV discontinued, tolerating good oral intake, denies nausea, denies pain, ambulating independently with steady gait, voiding without difficulty.   AVS reviewed with patient and sister, both verbalized good understanding. Patient agreeable with plan for discharge home.    Pandora Leiter, RN

## 2022-03-18 NOTE — Discharge Summary (Signed)
Name: Gail Jensen MRN: [id number] DOB: 23-Aug-1960     Admit Date: 03/16/2022   Date of Discharge: 03/18/2022     Patient was accepted for discharge to   Home or Self Care [1]           Discharge Attending Physician: Evelena Asa, MD      Hospitalization Summary    CONCISE NARRATIVE: Admitted via SDA.  Uncomplicated postop course on 23 hour unit.  Pain eventually controlled on oral medications.  Voiding without difficulty and tolerating regular diet.  Cleared by Physical Therapy.  Ready for discharge on POD # 2.            OR PROCEDURE: s/p L4-5 posterior decompression, PSF 03/16/22            SIGNIFICANT MED CHANGES: Yes  Oxycodone         Signed: Candelaria Stagers, PA  On: 03/18/2022  at: 10:18 AM

## 2022-03-18 NOTE — Discharge Instructions (Addendum)
We understand that pain management is an important part of your surgical recovery. We are committed to keeping you comfortable within the following guidelines:   We require 48 hour notice for all refills.   No refills will be granted during off hours, weekends, or holidays. Please plan ahead if you are running low on your medications.    Medications will be discontinued at a specific date depending upon your surgery and/ or injury. This will be discussed at the time of your pre-operative evaluation or at your first post-op appointment.      Call:  Call your Surgeon's office promptly if you experience any of these symptoms:  fever > 101.5 F, redness around surgical site, drainage from incision, change in numbness or tingling or weakness, inability to urinate or have a bowel movement, chills, night sweats, pain not relieved by medications.    If you cannot reach your Surgeon, then call the Answering Service (on-call phone number:  [phone]).  If you experience a medical emergency (such as chest pain or shortness of breath) then proceed directly to the Emergency Department.       Activity:  Walk for activity. No stretching or strengthening. No lifting.  No excessive or repetitive bending or twisting.  Do not drive until instructed that you may do so by your physician.    Medications: Take tylenol for pain as directed. For increased pain, take your Oxycodone as directed. If you are taking Oxycodone, take a stool softener every day.  Do NOT take Advil, Motrin, Ibuprofen, Aleve, Naproxyn, Toradol    Incision/Wound Care:  o For patients being discharged home on the same day of surgery after Fusion /   laminectomy, please maintain the dressing you are discharged home with for 3 days or   change the dressing earlier if it is saturated before the three days after surgery. You can   shower beginning 3 days after your surgery but keep the incision dry with occlusive   dressing when you shower.   You may shower while  incision is covered.  No tub baths or swimming.    Dr. Charlton Amor:  appointments [phone]

## 2022-03-18 NOTE — Progress Notes (Signed)
Physical Therapy Discharge Note    Therapy Recommendations:  Discharge Recommendations:  Discharge to Planned Living Arrangement with Caregiver Assistance:     Patient's mobility is currently not a barrier to discharge. No further PT visits needed. .   Recommendations:   PT Discharge Equipment Recommended: (P) None   Additional justification:   N/A     PT Mobility Recommendations: (P) Supervision during amb  PT Referral Recommendations: (P) OT     * Physical Therapist to review for discharge planning *      03/18/22 0822   PT Tracking   [facility]) PT TRACKING PT Discontinue   Visit Number   Visit Number [facility]) / Treatment Day (HH) 0   Visit Details [facility])   Visit Type [facility]) Follow Up-Home D/C   Precautions/Observations   Precautions used Yes   Fall Precautions General falls precautions   Activity Orders Present Yes   LDA Observation None   Vital Signs Response with Therapy VSS   Was patient wearing a mask? No   PPE worn by Clinical research associate [facility]   Patient Subjective pt agreeable to skilled PT   Current Pain Assessment   Pain Assessment / Reassessment Assessment   Pain Scale 0-10 (Numeric Scale for Pain Intensity)   Pain Location/Orientation Back;Incision   No Intervention/MAR Intervention(s) No Intervention required   Vision    Current Vision Adequate for PT session   Communication   Communication Communication Style   Communication Style Verbal   Cognition   Cognition Tested   Arousal/Alertness Appropriate responses to stimuli   Orientation A&Ox4   Ability to Follow Instructions Follows all commands and directions without difficulty   Bed Mobility   Bed mobility Tested   Supine to Sit Independent   Sit to Supine Independent   Additional comments Pt performed bed mobility well. Good utilization of log roll technique.   Transfers   Transfers Tested   Sit to Stand Supervision   Stand to sit Supervision   Transfer Assistive Device none   Additional comments Pt performed transfers well. Slow to start due to stiffness and some  pain in the lower back. Cued patient to breath throughout movements to decrease pain and promote blood flow.   Mobility   Mobility: Gait/Stairs Tested   Gait Pattern Decreased cadence   Ambulation Assist Supervision   Ambulation Distance (Feet) 100' x 2   Ambulation Assistive Device None   Stairs Assistance Supervision   Stair Management Technique One rail;Step to pattern;Forwards   Number of Stairs 7, 7, 7   Additional comments Pt tolerated gait and stairs with increased time but improving compared to day before due to decreased pain. Pt in high guard position throughout ambulation due to back pain and location of incision. No imbalances noted. Performed 3 flights of stairs with a step to pattern. Good control throughout with no imbalances noted.   Balance   Balance Tested   Sitting - Static Independent   Sitting - Dynamic Supervision   Standing - Static Supervision   Standing - Dynamic Supervision   Functional Outcome Measures   Functional Outcome Measures Yes   PT AM-PAC Mobility   Turning over in bed? 4   Moving from lying on back to sitting on the side of the bed? 4   Moving to and from a bed to a chair? 3   Sitting down on and standing up from a chair with arms? 3   Need to walk in hospital room? 3   Climbing 3 -  5 steps with a railing? 3   Total Raw Score 20   AM-PAC T-Scale Score 43.99   Cumulative Ambulation Score (CAM)   Get in and out of bed 2   Sit to stand, stand to sit from chair 1   Walking 1   Total CAM Score (max 6) 4   Assessment   Brief Assessment Patient demonstrates adequate mobility to discharge to planned living arrangement   Problem List Impaired transfers;Impaired ambulation;Impaired stair navigation;Pain contributing to impairment   Patient / Family Goal Decrease pain   Overall Assessment Pt tolerated treatment session well. Adaquate for home discharge with sister having 24/7 care due to her working from home. Recommend Home PT.   Plan/Recommendation   PT Treatment Interventions No further  PT interventions   PT Frequency none further   PT Mobility Recommendations Supervision during amb   PT Referral Recommendations OT   PT Discharge Recommendations Anticipate return to prior living arrangement;Intermittent supervision/assist;Home PT   PT Discharge Equipment Recommended None   Transportation Recommendations any   PT Assessment/Recommendations Reviewed With: Patient;Nursing;Advanced Practice Provider;Physician   Next PT Visit n/a   PT needs to see patient prior to DC  No   Time Calculation   Total Time Therapeutic Activities (minutes) 0   Total Time Gait Training (minutes) 10   Total Time Therapeutic Exercises (minutes) 0   Total Time Neuromuscular Re-education (minutes) 0   Total Time Group Therapy (minutes) 0   PT Timed Codes 10   PT Untimed Codes 0   PT Unbilled Time 0   PT Total Treatment 10   Plan and Onset date   Plan of Care Date   (d/c 9/27023)   Onset Date 03/16/22   Treatment Start Date 03/17/22       Adolph Pollack, Physical Therapist Assistant    (Please contact physical therapist on the treatment team via secure chat or via the secure chat group for your unit Physical Therapist (PT) with any questions.  On weekends/holidays, please utilize the secure chat group, [facility] 1st call, to contact PT.)

## 2022-03-18 NOTE — Progress Notes (Signed)
Orthopaedic Surgery Brief Progress Note      Dressing changed.  Incisions c/d/i.  No erythema, drainage, or induration seen.  New dry dressing applied.  Patient tolerated well. Cont current regimen.        Wynn Banker, MD   Orthopaedic Surgery Resident  03/18/2022 7:52 AM

## 2022-03-18 NOTE — Plan of Care (Signed)
Problem: Safety  Goal: Patient will remain free of falls  Outcome: Maintaining     Problem: Pain/Comfort  Goal: Patient's pain or discomfort is manageable  Outcome: Maintaining     Problem: Nutrition  Goal: Patient's nutritional status is maintained or improved  Outcome: Maintaining     Problem: Mobility  Goal: Patient's functional status is maintained or improved  Outcome: Maintaining     Problem: Psychosocial  Goal: Demonstrates ability to cope with illness  Outcome: Maintaining     Problem: Cognitive function  Goal: Cognitive function will be maintained or return to baseline  Description: Interventions:  Delirium Assessment  LIVEBAR Assessment    Outcome: Maintaining

## 2022-03-19 ENCOUNTER — Encounter: Payer: Self-pay | Admitting: Obstetrics and Gynecology

## 2022-03-20 ENCOUNTER — Telehealth: Payer: Self-pay | Admitting: Orthopaedic Surgery

## 2022-03-20 MED ORDER — METHYLPREDNISOLONE 4 MG PO TBPK *A*
ORAL_TABLET | ORAL | 0 refills | Status: DC
Start: 2022-03-20 — End: 2022-04-01

## 2022-03-20 NOTE — Telephone Encounter (Signed)
Patient called and wanted to speak with someone this morning, she is post op from 9/25, having severe pain in her right leg. She is worried about this pain. Best call back number is [phone]    [facility]

## 2022-03-24 ENCOUNTER — Ambulatory Visit: Payer: BLUE CROSS/BLUE SHIELD | Admitting: Family Medicine

## 2022-03-24 ENCOUNTER — Encounter: Payer: Self-pay | Admitting: Orthopaedic Surgery

## 2022-03-24 ENCOUNTER — Encounter: Payer: Self-pay | Admitting: Family Medicine

## 2022-03-24 ENCOUNTER — Other Ambulatory Visit
Admission: RE | Admit: 2022-03-24 | Discharge: 2022-03-24 | Disposition: A | Discharge status: Home / Self Care | Payer: BLUE CROSS/BLUE SHIELD | Source: Ambulatory Visit | Attending: Family Medicine | Admitting: Family Medicine

## 2022-03-24 ENCOUNTER — Other Ambulatory Visit: Payer: Self-pay

## 2022-03-24 VITALS — BP 120/80 | HR 72 | Temp 97.3°F | Ht 68.5 in | Wt 152.3 lb

## 2022-03-24 DIAGNOSIS — Z0000 Encounter for general adult medical examination without abnormal findings: Secondary | ICD-10-CM | POA: Insufficient documentation

## 2022-03-24 DIAGNOSIS — R12 Heartburn: Secondary | ICD-10-CM

## 2022-03-24 LAB — LIPID PANEL
Chol/HDL Ratio: 3.9
Cholesterol: 182 mg/dL
HDL: 47 mg/dL (ref 40–60)
LDL Calculated: 107 mg/dL
Non HDL Cholesterol: 135 mg/dL
Triglycerides: 141 mg/dL

## 2022-03-24 LAB — HEMOGLOBIN A1C: Hemoglobin A1C: 5.7 % — ABNORMAL HIGH

## 2022-03-24 NOTE — Progress Notes (Signed)
Subjective:   Gail Jensen is a 61 y.o. female with No past medical history on file.     Presented to the office today for:  Chief Complaint   Patient presents with    New Patient Visit       HPI    Gail Jensen is a pleasant 61 year old female who presents to our clinic to establish care, she recently, about 6 months ago moved back to [place] from [place] where she was to the past 20 years.    We reviewed her medications, she has multiple chronic medical conditions but most are being treated as needed.  This includes heartburn for which she takes a PPI, Valtrex for suppression, estradiol and sertraline for menopause along with mood stability, also takes lamotrigine for mood stability.    Vitals are appropriate today, weight is appropriate.      In regards to health maintenance, she is going to schedule her mammogram, had a Pap smear and colonoscopy already.  We will get a flu shot at her pharmacy.    Notably she is also about a week post L4-L5 decompression procedure, follows with spinal surgery and will see them again next week.  She is still having some pain in her back, sciatica as well, is on a Medrol Dosepak currently.      No recent A1c or lipid panel on file, we will check those as well.  She has also establish care with GYN in the area as well.    We will be responsible for refilling some of her medications and the others will be from the woman's health.    Review of Systems   Constitutional:  Negative for activity change, fatigue, fever and unexpected weight change.   Eyes:  Negative for visual disturbance.   Respiratory:  Negative for cough, chest tightness and shortness of breath.    Cardiovascular:  Negative for chest pain and palpitations.   Gastrointestinal:  Negative for abdominal pain, blood in stool, constipation, diarrhea and nausea.   Endocrine: Negative for cold intolerance and heat intolerance.   Genitourinary:  Negative for hematuria and vaginal bleeding.   Musculoskeletal:  Negative  for arthralgias.   Skin:  Negative for rash.   Neurological:  Negative for headaches.   Psychiatric/Behavioral:  Negative for sleep disturbance. The patient is not nervous/anxious.                  The patient has a   Family History   Problem Relation Age of Onset    Bladder Cancer Mother     Liver cancer Mother     Anesthesia problems Neg Hx        Objective:   BP 120/80 (BP Location: Left arm, Patient Position: Sitting)   Pulse 72   Temp 36.3 C (97.3 F) (Temporal)   Ht 1.74 m (5' 8.5")   Wt 69.1 kg (152 lb 4.8 oz)   SpO2 99%   BMI 22.82 kg/m    BP Readings from Last 3 Encounters:   03/24/22 120/80   03/18/22 122/74   03/11/22 102/82       Physical Exam  Constitutional:       Appearance: Normal appearance.   HENT:      Head: Normocephalic and atraumatic.      Mouth/Throat:      Mouth: Mucous membranes are moist.   Cardiovascular:      Rate and Rhythm: Normal rate and regular rhythm.   Pulmonary:      Effort:  Pulmonary effort is normal.      Breath sounds: Normal breath sounds.   Musculoskeletal:         General: No swelling or tenderness.   Skin:     General: Skin is warm.   Neurological:      General: No focal deficit present.      Mental Status: She is alert and oriented to person, place, and time.   Psychiatric:         Mood and Affect: Mood normal.         Lab Results   Component Value Date    WBC 11.7 (H) 03/18/2022    HGB 11.7 03/18/2022    HCT 36 03/18/2022    PLT 235 03/18/2022    ALT 27 03/11/2022    AST 27 03/11/2022    NA 140 03/16/2022    K 4.1 03/16/2022    CL 103 03/16/2022    CO2 27 03/16/2022     No components found for: CALCIUM, PHOS  No components found for: LDLCALC, LDLCHOLESTEROL, LDLDIRECT    Assessment and Plan:     1. Health care maintenance  - Hemoglobin A1c; Future  - Lipid Panel (Reflex to Direct  LDL if Triglycerides more than 400); Future            Requested Prescriptions      No prescriptions requested or ordered in this encounter       There are no discontinued  medications.    Gail Jensen received counseling on the following healthy behaviors: nutrition, exercise and medication adherence    Discussed use,benefit, and side effects of prescribed medications.  Barriers to medication compliance addressed.      All patient questions answered.  Pt voiced understanding.     Follow up in about 6 months (around 09/23/2022).        Disclaimer: Some orall of this note was transcribed using voice-recognition software.This may cause typographical errors occasionally. Although all effort is made to fix these errors, please do not hesitate to contact our office if there isany concern with the understanding of this note.

## 2022-03-25 ENCOUNTER — Ambulatory Visit: Payer: BLUE CROSS/BLUE SHIELD | Attending: Orthopaedic Surgery | Admitting: Orthopaedic Surgery

## 2022-03-25 ENCOUNTER — Other Ambulatory Visit: Payer: Self-pay | Admitting: Orthopaedic Surgery

## 2022-03-25 ENCOUNTER — Ambulatory Visit
Admission: RE | Admit: 2022-03-25 | Discharge: 2022-03-25 | Disposition: A | Discharge status: Home / Self Care | Payer: BLUE CROSS/BLUE SHIELD | Source: Ambulatory Visit

## 2022-03-25 VITALS — BP 118/57 | HR 74 | Ht 68.0 in | Wt 152.8 lb

## 2022-03-25 DIAGNOSIS — M5416 Radiculopathy, lumbar region: Secondary | ICD-10-CM

## 2022-03-25 DIAGNOSIS — Z981 Arthrodesis status: Secondary | ICD-10-CM

## 2022-03-25 MED ORDER — IBUPROFEN 800 MG PO TABS *I*
800.0000 mg | ORAL_TABLET | Freq: Three times a day (TID) | ORAL | 1 refills | Status: DC | PRN
Start: 2022-03-25 — End: 2023-12-27

## 2022-03-25 NOTE — Progress Notes (Signed)
HISTORY OF PRESENT ILLNESS    61 y.o. female returns to clinic today for early first postop visit. They underwent L4-5 revision laminectomy and fusion on 03/16/22 for left lumbar radiculopathy. I attempted to place an interbody device from the right side but there was a lot of scar tissue which prevented me from safely mobilizing the right L5 nerve root to access the disc space.  She called the office on Friday having increasing right leg pain which did not improve with a steroid taper over the weekend so she called yesterday to move up for postop appointment.  She has a lot of pain in the right buttock which radiates down the posterior thigh and into the calf.  She has an area of numbness over the anterolateral right leg and paresthesias on the dorsal aspect of the right foot.  Currently taking Tylenol 1000 mg every 8 hours, oxycodone 5 mg at night, and gabapentin 300 mg at night for pain control.  She denies any fevers or chills.  No issues with her incision at all.        REVIEW OF SYSTEMS:   Noncontributory for head, eyes, ear, nose, and throat, cardiac, pulmonary, gastrointestinal, genitourinary, integumentary, psychiatric, endocrine, neurologic and musculoskeletal systems.       PHYSICAL EXAMINATION:   Vitals:    03/25/22 0901   BP: 118/57   Pulse: 74   Weight: 69.3 kg (152 lb 12.8 oz)   Height: 1.727 m (5\' 8" )       Patient is  well-appearing and in no apparent distress.  They ambulate with a normal gait, using a cane for assistance.     Incision is healing well with no surrounding erythema or drainage.  Nylon sutures are intact.     Overall posture and alignment are within normal limits.    She has normal strength and sensation throughout the left lower extremity.  Dysesthesias in the right L5 distribution.  Trace weakness in the right tibialis anterior compared to the left side.  Extremities are warm and well-perfused without evidence of peripheral edema.       IMAGING:    Upright lumbar spine x-rays were  obtained in the office today and personally reviewed.  Overall alignment is within normal limits.  No evidence of acute hardware failure.  Unchanged from intraoperative fluoroscopy.    ASSESSMENT AND PLAN:    [name] has a flareup of right lumbar radiculopathy s/p L4-5 revision laminectomy and fusion.  I had like to order a CT scan to better evaluate the position of the hardware.  In the meantime she will be finishing out her steroid taper today.  She should continue with the Tylenol 1000 mg every 8 hours.  I sent in a prescription for ibuprofen 800 mg 3 times daily.  She can increase her gabapentin to 300 mg 3 times daily instead of just taking it at night.  She can continue using the oxycodone sparingly as needed for surgical site pain.  She has scheduled follow-up with me next Monday so hopefully the CT scan can get done before then and we can touch base early next week to see how her pain is on this new medication regiment and review the CT scan together.  She knows to call the office in the meantime with any new questions or concerns.    Follow up:  Next Monday, 03/30/2022    X-rays/studies at follow up:  Review CT scan, no new x-rays needed.

## 2022-03-27 ENCOUNTER — Other Ambulatory Visit: Payer: BLUE CROSS/BLUE SHIELD

## 2022-03-27 ENCOUNTER — Other Ambulatory Visit: Payer: Self-pay

## 2022-03-27 ENCOUNTER — Ambulatory Visit
Admission: RE | Admit: 2022-03-27 | Discharge: 2022-03-27 | Disposition: A | Discharge status: Home / Self Care | Payer: BLUE CROSS/BLUE SHIELD | Source: Ambulatory Visit | Attending: Orthopaedic Surgery | Admitting: Orthopaedic Surgery

## 2022-03-27 DIAGNOSIS — Z981 Arthrodesis status: Secondary | ICD-10-CM | POA: Insufficient documentation

## 2022-03-27 DIAGNOSIS — M4726 Other spondylosis with radiculopathy, lumbar region: Secondary | ICD-10-CM | POA: Insufficient documentation

## 2022-03-27 DIAGNOSIS — M48061 Spinal stenosis, lumbar region without neurogenic claudication: Secondary | ICD-10-CM | POA: Insufficient documentation

## 2022-03-30 ENCOUNTER — Ambulatory Visit: Payer: BLUE CROSS/BLUE SHIELD | Admitting: Orthopaedic Surgery

## 2022-03-30 ENCOUNTER — Encounter: Payer: Self-pay | Admitting: Orthopaedic Surgery

## 2022-03-30 ENCOUNTER — Other Ambulatory Visit: Payer: Self-pay

## 2022-03-30 VITALS — BP 144/65 | HR 65 | Ht 68.0 in | Wt 153.0 lb

## 2022-03-30 DIAGNOSIS — M5416 Radiculopathy, lumbar region: Secondary | ICD-10-CM

## 2022-03-30 DIAGNOSIS — Z981 Arthrodesis status: Secondary | ICD-10-CM | POA: Insufficient documentation

## 2022-03-30 NOTE — Progress Notes (Signed)
HISTORY OF PRESENT ILLNESS    61 y.o. female returns to clinic today to review the results of her CT scan. She underwent L4-5 revision laminectomy and fusion on 03/16/22 for left lumbar radiculopathy. Her left leg pain improved following surgery, however she was having increased right leg pain postoperatively. Since I saw her last week, she reports that her symptoms are unchanged despite increasing her gabapentin dose and finishing the steroid taper. Denies fever/chills or other infectious symptoms.        REVIEW OF SYSTEMS:   Noncontributory for head, eyes, ear, nose, and throat, cardiac, pulmonary, gastrointestinal, genitourinary, integumentary, psychiatric, endocrine, neurologic and musculoskeletal systems.       PHYSICAL EXAMINATION:   Vitals:    03/30/22 1405   BP: 144/65   Pulse: 65   Weight: 69.4 kg (153 lb)   Height: 1.727 m (5\' 8" )         Patient is  well-appearing and in no apparent distress.  They ambulate with a normal gait, no cane today.  Incision is healing well with no surrounding erythema or drainage.  Nylon sutures are intact.     Overall posture and alignment are within normal limits. Tender to palpation in the right lumbar paraspinal muscles.   She has normal strength and sensation throughout the left lower extremity.  Dysesthesias in the right L5 distribution.  Trace weakness in the right tibialis anterior compared to the left side.  Extremities are warm and well-perfused without evidence of peripheral edema.       IMAGING:    CT lumbar spine from 03/27/22 was reviewed. Radiologist's report not available yet. Right L4 screw is positioned medially. Otherwise hardware position looks acceptable.    ASSESSMENT AND PLAN:    Gail Jensen has had right lumbar radiculopathy s/p L4-5 revision laminectomy and fusion. I think her right leg pain is a result of right L4 screw malposition. I would like to return to the OR to review the screw and we can add this on to the white sheet trauma room on Wednesday 04/01/22. I  reviewed the risks, benefits and goals of the surgery. I explained that I will try to reposition the cortical trajectory screw but if I cannot get acceptable position, then I can convert to a traditional pedicle screw. Worst case scenario I would leave the right sided hardware out and add more bone graft for fusion. Additional surgical risks are similar to her index procedure and include but are not limited to bleeding, infection, nerve injury, permanent numbness or weakness, persistent pain, CSF leak/dural tear, hardware failure or malpositioning, nonunion, adjacent level degeneration, need for further surgery, and medical/anesthesia complications.  She understands and wishes to proceed with surgery on Wednesday as discussed. She knows to call the office in the meantime with any new questions or concerns.    Follow up:Wednesday for surgery

## 2022-04-01 ENCOUNTER — Encounter: Payer: Self-pay | Admitting: Orthopaedic Surgery

## 2022-04-01 ENCOUNTER — Inpatient Hospital Stay: Payer: BLUE CROSS/BLUE SHIELD

## 2022-04-01 ENCOUNTER — Other Ambulatory Visit: Payer: Self-pay

## 2022-04-01 ENCOUNTER — Inpatient Hospital Stay
Admission: RE | Admit: 2022-04-01 | Discharge: 2022-04-02 | Disposition: A | Discharge status: Home / Self Care | Payer: BLUE CROSS/BLUE SHIELD | Source: Ambulatory Visit | Attending: Orthopaedic Surgery | Admitting: Orthopaedic Surgery

## 2022-04-01 ENCOUNTER — Inpatient Hospital Stay: Payer: BLUE CROSS/BLUE SHIELD | Admitting: Nurse Practitioner

## 2022-04-01 ENCOUNTER — Encounter
Admission: RE | Disposition: A | Discharge status: Home / Self Care | Payer: Self-pay | Source: Ambulatory Visit | Attending: Orthopaedic Surgery

## 2022-04-01 DIAGNOSIS — Z981 Arthrodesis status: Secondary | ICD-10-CM

## 2022-04-01 DIAGNOSIS — Z4789 Encounter for other orthopedic aftercare: Secondary | ICD-10-CM

## 2022-04-01 DIAGNOSIS — T84296A Other mechanical complication of internal fixation device of vertebrae, initial encounter: Secondary | ICD-10-CM

## 2022-04-01 DIAGNOSIS — M5416 Radiculopathy, lumbar region: Secondary | ICD-10-CM | POA: Insufficient documentation

## 2022-04-01 DIAGNOSIS — T84226A Displacement of internal fixation device of vertebrae, initial encounter: Secondary | ICD-10-CM | POA: Insufficient documentation

## 2022-04-01 DIAGNOSIS — Y828 Other medical devices associated with adverse incidents: Secondary | ICD-10-CM | POA: Insufficient documentation

## 2022-04-01 HISTORY — PX: PR REINSERTION SPINAL FIXATION DEVICE: 22849

## 2022-04-01 LAB — CBC AND DIFFERENTIAL
Baso # K/uL: 0 10*3/uL (ref 0.0–0.2)
Baso # K/uL: 0 10*3/uL (ref 0.0–0.2)
Basophil %: 0.3 %
Basophil %: 0.2 %
Eos # K/uL: 0 10*3/uL (ref 0.0–0.5)
Eos # K/uL: 0 10*3/uL (ref 0.0–0.5)
Eosinophil %: 0.1 %
Eosinophil %: 0.1 %
Hematocrit: 37 % (ref 34–49)
Hematocrit: 34 % (ref 34–49)
Hemoglobin: 11.9 g/dL (ref 11.2–16.0)
Hemoglobin: 10.8 g/dL — ABNORMAL LOW (ref 11.2–16.0)
IMM Granulocytes #: 0 10*3/uL (ref 0.0–0.0)
IMM Granulocytes #: 0 10*3/uL (ref 0.0–0.0)
IMM Granulocytes: 0.3 %
IMM Granulocytes: 0.3 %
Lymph # K/uL: 0.5 10*3/uL — ABNORMAL LOW (ref 1.0–5.0)
Lymph # K/uL: 1.1 10*3/uL (ref 1.0–5.0)
Lymphocyte %: 4.9 %
Lymphocyte %: 12.3 %
MCH: 31 pg (ref 26–32)
MCH: 30 pg (ref 26–32)
MCHC: 32 g/dL (ref 32–36)
MCHC: 32 g/dL (ref 32–36)
MCV: 95 fL (ref 75–100)
MCV: 94 fL (ref 75–100)
Mono # K/uL: 0.1 10*3/uL (ref 0.1–1.0)
Mono # K/uL: 0.4 10*3/uL (ref 0.1–1.0)
Monocyte %: 0.6 %
Monocyte %: 4.1 %
Neut # K/uL: 9 10*3/uL — ABNORMAL HIGH (ref 1.5–6.5)
Neut # K/uL: 7.6 10*3/uL — ABNORMAL HIGH (ref 1.5–6.5)
Nucl RBC # K/uL: 0 10*3/uL (ref 0.0–0.0)
Nucl RBC # K/uL: 0 10*3/uL (ref 0.0–0.0)
Nucl RBC %: 0 /100 WBC (ref 0.0–0.2)
Nucl RBC %: 0 /100 WBC (ref 0.0–0.2)
Platelets: 303 10*3/uL (ref 150–450)
Platelets: 288 10*3/uL (ref 150–450)
RBC: 3.9 MIL/uL — ABNORMAL LOW (ref 4.0–5.5)
RBC: 3.6 MIL/uL — ABNORMAL LOW (ref 4.0–5.5)
RDW: 11.9 % (ref 0.0–15.0)
RDW: 11.9 % (ref 0.0–15.0)
Seg Neut %: 93.8 %
Seg Neut %: 83 %
WBC: 9.6 10*3/uL (ref 3.5–11.0)
WBC: 9.2 10*3/uL (ref 3.5–11.0)

## 2022-04-01 LAB — BASIC METABOLIC PANEL
Anion Gap: 13 (ref 7–16)
Anion Gap: 10 (ref 7–16)
CO2: 25 mmol/L (ref 20–28)
CO2: 27 mmol/L (ref 20–28)
Calcium: 8.9 mg/dL (ref 8.6–10.2)
Calcium: 8.8 mg/dL (ref 8.6–10.2)
Chloride: 103 mmol/L (ref 96–108)
Chloride: 102 mmol/L (ref 96–108)
Creatinine: 0.71 mg/dL (ref 0.51–0.95)
Creatinine: 0.85 mg/dL (ref 0.51–0.95)
Glucose: 169 mg/dL — ABNORMAL HIGH (ref 60–99)
Glucose: 99 mg/dL (ref 60–99)
Lab: 14 mg/dL (ref 6–20)
Lab: 13 mg/dL (ref 6–20)
Potassium: 4.2 mmol/L (ref 3.3–5.1)
Potassium: 4.4 mmol/L (ref 3.3–5.1)
Sodium: 141 mmol/L (ref 133–145)
Sodium: 139 mmol/L (ref 133–145)
eGFR BY CREAT: 98 *
eGFR BY CREAT: 79 *

## 2022-04-01 SURGERY — LENGTHENING, GROWTH ROD
Anesthesia: General | Site: Back | Wound class: Clean

## 2022-04-01 MED ORDER — DEXAMETHASONE SODIUM PHOSPHATE 4 MG/ML INJ SOLN *WRAPPED*
INTRAMUSCULAR | Status: DC | PRN
Start: 2022-04-01 — End: 2022-04-01
  Administered 2022-04-01: 8 mg via INTRAVENOUS

## 2022-04-01 MED ORDER — CEFAZOLIN 2000 MG IN STERILE WATER 20ML SYRINGE *I*
2000.0000 mg | PREFILLED_SYRINGE | Freq: Once | INTRAVENOUS | Status: AC
Start: 2022-04-01 — End: 2022-04-01
  Administered 2022-04-01: 2000 mg via INTRAVENOUS

## 2022-04-01 MED ORDER — SODIUM CHLORIDE 0.9 % IV SOLN WRAPPED *I*
20.0000 mL/h | Status: DC
Start: 2022-04-01 — End: 2022-04-01

## 2022-04-01 MED ORDER — ROCURONIUM BROMIDE 10 MG/ML IV SOLN *WRAPPED*
Status: DC | PRN
Start: 2022-04-01 — End: 2022-04-01
  Administered 2022-04-01: 10 mg via INTRAVENOUS
  Administered 2022-04-01: 70 mg via INTRAVENOUS

## 2022-04-01 MED ORDER — SUGAMMADEX SODIUM 100 MG/1ML IV SOLN *WRAPPED*
INTRAVENOUS | Status: DC | PRN
Start: 2022-04-01 — End: 2022-04-01
  Administered 2022-04-01: 200 mg via INTRAVENOUS

## 2022-04-01 MED ORDER — TIZANIDINE HCL 4 MG PO TABS *I*
4.0000 mg | ORAL_TABLET | Freq: Four times a day (QID) | ORAL | Status: DC | PRN
Start: 2022-04-01 — End: 2022-04-02
  Administered 2022-04-01 – 2022-04-02 (×2): 4 mg via ORAL
  Filled 2022-04-01 (×3): qty 1

## 2022-04-01 MED ORDER — TRAZODONE HCL 50 MG PO TABS *I*
50.0000 mg | ORAL_TABLET | Freq: Every evening | ORAL | Status: DC | PRN
Start: 2022-04-01 — End: 2022-04-02
  Administered 2022-04-02: 50 mg via ORAL
  Filled 2022-04-01 (×2): qty 1

## 2022-04-01 MED ORDER — SODIUM CHLORIDE 0.9 % INJ (FLUSH) WRAPPED *I*
Status: AC
Start: 2022-04-01 — End: 2022-04-01
  Filled 2022-04-01: qty 30

## 2022-04-01 MED ORDER — BUPIVACAINE-EPINEPHRINE 0.5 % IJ SOLUTION *WRAPPED*
INTRAMUSCULAR | Status: DC | PRN
Start: 2022-04-01 — End: 2022-04-01
  Administered 2022-04-01: 25 mL via SUBCUTANEOUS

## 2022-04-01 MED ORDER — MIDAZOLAM HCL 1 MG/ML IJ SOLN *I* WRAPPED
INTRAMUSCULAR | Status: DC | PRN
Start: 2022-04-01 — End: 2022-04-01
  Administered 2022-04-01: 2 mg via INTRAVENOUS

## 2022-04-01 MED ORDER — SENNOSIDES 8.6 MG PO TABS *I*
ORAL_TABLET | ORAL | Status: AC
Start: 2022-04-01 — End: 2022-04-01
  Filled 2022-04-01: qty 2

## 2022-04-01 MED ORDER — HYDROMORPHONE HCL PF 1 MG/ML IJ SOLN *WRAPPED*
0.5000 mg | INTRAMUSCULAR | Status: DC | PRN
Start: 2022-04-01 — End: 2022-04-02
  Administered 2022-04-01 (×2): 0.5 mg via INTRAVENOUS

## 2022-04-01 MED ORDER — THROMBIN (RECOMBINANT) 5000 UNIT EX SOLR *I* WRAPPED
CUTANEOUS | Status: AC
Start: 2022-04-01 — End: 2022-04-01
  Filled 2022-04-01: qty 1

## 2022-04-01 MED ORDER — DEXMEDETOMIDINE HCL 200 MCG/2ML IV SOLN WRAPPED *I*
INTRAVENOUS | Status: DC | PRN
  Administered 2022-04-01: 8 ug via INTRAVENOUS
  Administered 2022-04-01: 4 ug via INTRAVENOUS
  Administered 2022-04-01: 8 ug via INTRAVENOUS

## 2022-04-01 MED ORDER — MEPERIDINE HCL 25 MG/ML IJ SOLN *I*
INTRAMUSCULAR | Status: AC
Start: 2022-04-01 — End: 2022-04-01
  Filled 2022-04-01: qty 1

## 2022-04-01 MED ORDER — CEFAZOLIN 2000 MG IN STERILE WATER 20ML SYRINGE *I*
PREFILLED_SYRINGE | INTRAVENOUS | Status: AC
Start: 2022-04-01 — End: 2022-04-01
  Filled 2022-04-01: qty 20

## 2022-04-01 MED ORDER — POLYETHYLENE GLYCOL 3350 PO PACK 17 GM *I*
17.0000 g | PACK | Freq: Every day | ORAL | Status: DC | PRN
Start: 2022-04-01 — End: 2022-04-02

## 2022-04-01 MED ORDER — DEXTROSE 5 % FLUSH FOR PUMPS *I*
0.0000 mL/h | INTRAVENOUS | Status: DC | PRN
Start: 2022-04-01 — End: 2022-04-01

## 2022-04-01 MED ORDER — ACETAMINOPHEN 500 MG PO TABS *I*
ORAL_TABLET | ORAL | Status: AC
Start: 2022-04-01 — End: 2022-04-01
  Filled 2022-04-01: qty 2

## 2022-04-01 MED ORDER — FENTANYL CITRATE 50 MCG/ML IJ SOLN *WRAPPED*
INTRAMUSCULAR | Status: AC
Start: 2022-04-01 — End: 2022-04-01
  Filled 2022-04-01: qty 5

## 2022-04-01 MED ORDER — HYDROMORPHONE HCL PF 1 MG/ML IJ SOLN *WRAPPED*
INTRAMUSCULAR | Status: AC
Start: 2022-04-01 — End: 2022-04-01
  Filled 2022-04-01: qty 0.5

## 2022-04-01 MED ORDER — ONDANSETRON HCL 2 MG/ML IV SOLN *I*
4.0000 mg | Freq: Once | INTRAMUSCULAR | Status: DC | PRN
Start: 2022-04-01 — End: 2022-04-01

## 2022-04-01 MED ORDER — LACTATED RINGERS IV SOLN *I*
20.0000 mL/h | INTRAVENOUS | Status: DC
Start: 2022-04-01 — End: 2022-04-01
  Administered 2022-04-01: 20 mL/h via INTRAVENOUS

## 2022-04-01 MED ORDER — LIDOCAINE HCL 1% IJ SOLN *WRAPPED* (FOR LINE PLACEMENT ONLY)
0.1000 mL | INTRAMUSCULAR | Status: DC | PRN
Start: 2022-04-01 — End: 2022-04-01
  Administered 2022-04-01: 0.1 mL via SUBCUTANEOUS

## 2022-04-01 MED ORDER — THROMBIN (RECOMBINANT) 5000 UNIT EX SOLR *I* WRAPPED
INTRAMUSCULAR | Status: DC | PRN
Start: 2022-04-01 — End: 2022-04-01
  Administered 2022-04-01: 30 mL via TOPICAL

## 2022-04-01 MED ORDER — MEPERIDINE HCL 25 MG/ML IJ SOLN *I*
25.0000 mg | Freq: Once | INTRAMUSCULAR | Status: AC
Start: 2022-04-01 — End: 2022-04-01
  Administered 2022-04-01: 25 mg via INTRAVENOUS

## 2022-04-01 MED ORDER — LAMOTRIGINE 25 MG PO TABS *I*
150.0000 mg | ORAL_TABLET | Freq: Every day | ORAL | Status: DC
Start: 2022-04-01 — End: 2022-04-02
  Administered 2022-04-01: 150 mg via ORAL
  Filled 2022-04-01 (×3): qty 2

## 2022-04-01 MED ORDER — BUPIVACAINE-EPINEPHRINE 0.5 % IJ SOLUTION *WRAPPED*
INTRAMUSCULAR | Status: AC
Start: 2022-04-01 — End: 2022-04-01
  Filled 2022-04-01: qty 30

## 2022-04-01 MED ORDER — SERTRALINE HCL 100 MG PO TABS *I*
100.0000 mg | ORAL_TABLET | Freq: Every day | ORAL | Status: DC
Start: 2022-04-01 — End: 2022-04-02
  Administered 2022-04-01: 100 mg via ORAL
  Filled 2022-04-01 (×3): qty 1

## 2022-04-01 MED ORDER — ACETAMINOPHEN 500 MG PO TABS *I*
1000.0000 mg | ORAL_TABLET | Freq: Three times a day (TID) | ORAL | Status: DC
Start: 2022-04-01 — End: 2022-04-02
  Administered 2022-04-01 – 2022-04-02 (×3): 1000 mg via ORAL

## 2022-04-01 MED ORDER — PANTOPRAZOLE SODIUM 20 MG PO TBEC *I*
20.0000 mg | DELAYED_RELEASE_TABLET | Freq: Every morning | ORAL | Status: DC
Start: 2022-04-01 — End: 2022-04-02
  Administered 2022-04-01 – 2022-04-02 (×2): 20 mg via ORAL
  Filled 2022-04-01 (×3): qty 1

## 2022-04-01 MED ORDER — EPHEDRINE 5MG/ML IN NS IV/IJ *WRAPPED*
INTRAMUSCULAR | Status: DC | PRN
Start: 2022-04-01 — End: 2022-04-01
  Administered 2022-04-01: 10 mg via INTRAVENOUS
  Administered 2022-04-01 (×2): 5 mg via INTRAVENOUS

## 2022-04-01 MED ORDER — OXYCODONE HCL 10 MG PO TABS *I*
ORAL_TABLET | ORAL | Status: AC
Start: 2022-04-01 — End: 2022-04-01
  Filled 2022-04-01: qty 1

## 2022-04-01 MED ORDER — CALCIUM CARBONATE ANTACID 500 MG PO CHEW *I*
CHEWABLE_TABLET | ORAL | Status: AC
Start: 2022-04-01 — End: 2022-04-01
  Filled 2022-04-01: qty 2

## 2022-04-01 MED ORDER — CALCIUM CARBONATE ANTACID 500 MG PO CHEW *I*
1000.0000 mg | CHEWABLE_TABLET | Freq: Two times a day (BID) | ORAL | Status: DC | PRN
Start: 2022-04-01 — End: 2022-04-02
  Administered 2022-04-01: 1000 mg via ORAL

## 2022-04-01 MED ORDER — SODIUM CHLORIDE 0.9 % FLUSH FOR PUMPS *I*
0.0000 mL/h | INTRAVENOUS | Status: DC | PRN
Start: 2022-04-01 — End: 2022-04-01

## 2022-04-01 MED ORDER — VALACYCLOVIR HCL 500 MG PO TABS *I*
500.0000 mg | ORAL_TABLET | Freq: Two times a day (BID) | ORAL | Status: DC
Start: 2022-04-01 — End: 2022-04-02
  Administered 2022-04-01 – 2022-04-02 (×3): 500 mg via ORAL
  Filled 2022-04-01 (×5): qty 1

## 2022-04-01 MED ORDER — PHENYLEPHRINE 100 MCG/ML IN NS 10 ML *WRAPPED*
INTRAMUSCULAR | Status: DC | PRN
Start: 2022-04-01 — End: 2022-04-01
  Administered 2022-04-01 (×5): 100 ug via INTRAVENOUS

## 2022-04-01 MED ORDER — MIDAZOLAM HCL 1 MG/ML IJ SOLN *I* WRAPPED
INTRAMUSCULAR | Status: AC
Start: 2022-04-01 — End: 2022-04-01
  Filled 2022-04-01: qty 2

## 2022-04-01 MED ORDER — HYDROMORPHONE HCL PF 1 MG/ML IJ SOLN *WRAPPED*
0.5000 mg | INTRAMUSCULAR | Status: AC | PRN
Start: 2022-04-01 — End: 2022-04-01
  Administered 2022-04-01 (×3): 0.5 mg via INTRAVENOUS

## 2022-04-01 MED ORDER — SENNOSIDES 8.6 MG PO TABS *I*
2.0000 | ORAL_TABLET | Freq: Every day | ORAL | Status: DC
Start: 2022-04-01 — End: 2022-04-02
  Administered 2022-04-01 – 2022-04-02 (×2): 2 via ORAL

## 2022-04-01 MED ORDER — HYDROMORPHONE HCL PF 1 MG/ML IJ SOLN *WRAPPED*
0.2500 mg | INTRAMUSCULAR | Status: AC | PRN
Start: 2022-04-01 — End: 2022-04-01

## 2022-04-01 MED ORDER — PROPOFOL 10 MG/ML IV EMUL (INTERMITTENT DOSING) WRAPPED *I*
INTRAVENOUS | Status: DC | PRN
Start: 2022-04-01 — End: 2022-04-01
  Administered 2022-04-01: 50 mg via INTRAVENOUS
  Administered 2022-04-01: 20 mg via INTRAVENOUS
  Administered 2022-04-01: 30 mg via INTRAVENOUS
  Administered 2022-04-01: 150 mg via INTRAVENOUS

## 2022-04-01 MED ORDER — OXYCODONE HCL 5 MG PO TABS *I*
10.0000 mg | ORAL_TABLET | ORAL | Status: DC | PRN
Start: 2022-04-01 — End: 2022-04-02
  Administered 2022-04-01 – 2022-04-02 (×3): 10 mg via ORAL

## 2022-04-01 MED ORDER — FENTANYL CITRATE 50 MCG/ML IJ SOLN *WRAPPED*
INTRAMUSCULAR | Status: DC | PRN
Start: 2022-04-01 — End: 2022-04-01
  Administered 2022-04-01: 50 ug via INTRAVENOUS
  Administered 2022-04-01: 100 ug via INTRAVENOUS

## 2022-04-01 MED ORDER — LIDOCAINE HCL 2 % IJ SOLN *I*
INTRAMUSCULAR | Status: DC | PRN
Start: 2022-04-01 — End: 2022-04-01
  Administered 2022-04-01: 100 mg via INTRAVENOUS

## 2022-04-01 MED ORDER — PLASMA-LYTE IV SOLN *WRAPPED*
Status: DC | PRN
Start: 2022-04-01 — End: 2022-04-01

## 2022-04-01 MED ORDER — OXYCODONE HCL 5 MG PO TABS *I*
5.0000 mg | ORAL_TABLET | ORAL | Status: DC | PRN
Start: 2022-04-01 — End: 2022-04-02
  Administered 2022-04-02: 5 mg via ORAL

## 2022-04-01 MED ORDER — ONDANSETRON HCL 2 MG/ML IV SOLN *I*
INTRAMUSCULAR | Status: DC | PRN
Start: 2022-04-01 — End: 2022-04-01
  Administered 2022-04-01: 4 mg via INTRAVENOUS

## 2022-04-01 MED ORDER — CEFAZOLIN 2000 MG IN STERILE WATER 20ML SYRINGE *I*
2000.0000 mg | PREFILLED_SYRINGE | Freq: Three times a day (TID) | INTRAVENOUS | Status: AC
Start: 2022-04-01 — End: 2022-04-02
  Administered 2022-04-01 – 2022-04-02 (×3): 2000 mg via INTRAVENOUS

## 2022-04-01 SURGICAL SUPPLY — 42 items
APPLICATOR CHLORAPREP 26ML ORANGE LARGE (Solution) ×2 IMPLANT
BLADE GUARDED EXTN 6.5IN (Supply) IMPLANT
BLADE TEFLON 4IN MODIFIED (Supply) ×1 IMPLANT
BLADE THE MILL BONE MED 5MM DISP (Other) IMPLANT
BUR SURGICAL L14CM HEAD L3.8MM DIA3MM MATCH HEAD FLUTED LARGE BORE MR8 (Supply) IMPLANT
COVER HANDLE FOR STERIS LIGHTING STERILE (Supply) ×3 IMPLANT
CUTTER MTL HD L18.3MM DIA3MM CARB LEGEND MIDAS REX (Other) IMPLANT
DRAIN BULB RESERVE 400CC (Supply) IMPLANT
DRAIN WOUND RND SIL 15 FR HUBLESS TROCAR (Supply) IMPLANT
DRAPE C ARM TRNSPAR POLY PROTCT BARR FOR ~~LOC~~ FLROSCP C-ARMOR (Drape) ×1 IMPLANT
DRAPE C-ARM 42 IN X 120 IN (Supply) ×1 IMPLANT
DRAPE STERI INST.POUCH (Drape) ×1 IMPLANT
DRESSING TEGADERM 4 X 10IN (Dressing) ×3 IMPLANT
ELECTRODE BLADE MODIFIED 2.5IN (Supply) ×1 IMPLANT
ENDCAP SPNL DIA5.5MM LOK CREO (Implant) ×2 IMPLANT
FILTER NEPTUNE 4PORT MANIFOLD (Supply) ×1 IMPLANT
GLOVE BIOGEL PI MICRO SZ 6.5 (Glove) ×3 IMPLANT
GLOVE SURG GAMMEX DERMAPRENE ULTRA SZ6.5 PF LF (Glove) ×2 IMPLANT
KIT CARE PATIENT ORHTO NL (Supply) ×1 IMPLANT
KIT MATRIX HEMOSTATIC SURGIFLO 8ML (Supply) ×2 IMPLANT
NEEDLE HYPO SAFETYGLIDE 22G X 1.5IN (Needle) ×1
NEEDLE HYPO SAFETYGLIDE 22G X 1.5IN GREEN (Needle) ×1 IMPLANT
PACK CUSTOM ORTHO SPINE CDS (Pack) ×1 IMPLANT
PACK TOWEL LIGHT BLUE STERILE (Pack) ×2 IMPLANT
PATTIES SURG 1/2 X 1.5IN (Sponge) IMPLANT
PENCIL SMOKE EVAC COATED PB (Supply) ×1 IMPLANT
PILLOW CUSHION INSERT PRONE VW DE-TEC (Supply) ×1 IMPLANT
PROTECTOR ULNA NERVE ~~LOC~~ (Supply) ×2 IMPLANT
ROD TI ALLOY CURVED  5.5MM X 40MM LEN (Rod) ×1 IMPLANT
SCREW CORT MODULAR CREO AMP 6.5-5.5 X 35MM (Screw) ×1 IMPLANT
SLEEVE COMP KNEE HI MED (Supply) ×1 IMPLANT
SOL SODIUM CHLORIDE IRRIG 1000ML BTL (Solution) ×1 IMPLANT
SPONGE SUR W0.25XL9/16IN WHT COT RND KTNR DISECT RADPQ DISP (Supply) ×1 IMPLANT
STAPLER SKIN WIDE STPL LEG L3.9MM WIRE DIA0.58MM FIX HD PROX (Supply) ×1 IMPLANT
SUTR ETHILON 3/0 18IN PS-1 P-14 BLACK (Suture) ×3 IMPLANT
SUTR ETHILON MONO 3-0 PS-1 BLACK (Suture) ×1 IMPLANT
SUTR ETHILON MONO 3-0 PS-2 BLACK (Suture) ×2 IMPLANT
SUTR VICRYL ANTIB 1 CT-1 27 VIOLET (Suture) ×3 IMPLANT
SUTR VICRYL ANTIB BRD 2-0 CP-2 18 UNDYED (Suture) ×2 IMPLANT
SYRINGE CONTROL 12CC LUER LOCK (Syringe) ×1 IMPLANT
TUBING NONCONDUC CONN 12FT X 3/16IN (Tubing) ×1 IMPLANT
TULIP POLYAXIAL CREO AMP 5.5MM (Implant) ×1 IMPLANT

## 2022-04-01 NOTE — Anesthesia Preprocedure Evaluation (Signed)
Anesthesia Pre-operative History and Physical for [facility]  .  .  .  Anesthesia Evaluation Information Source: patient, records     ANESTHESIA HISTORY     Denies anesthesia history    GENERAL     Denies general issues     PULMONARY     Denies pulmonary issues    CARDIOVASCULAR  Good(4+METs) Exercise Tolerance    GI/HEPATIC/RENAL   NPO: > 8hrs ago (solids) and > 2hrs ago (clears)       Denies GI/hepatic/renal issues NEURO/PSYCH/ORTHO    + Neuropsychiatric Issues    + Neuromuscular disease    + Gait/Mobility issues                 Physical Exam    Airway            Mallampati: II            TM distance (fb): >3 FB            TM distance (cm): 3            Neck ROM: full            Airway Impression: easy     Cardiovascular  Normal Exam           Rhythm: regular           Rate: normal         Pulmonary   Normal Exam    breath sounds clear to auscultation             ________________________________________________________________________  PLAN  ASA Score  1  Anesthetic Plan general       Induction (routine IV) General Anesthesia/Sedation Maintenance Plan (inhaled agents and IV bolus);  Airway Manipulation (direct laryngoscopy); Airway (cuffed ETT); Line ( use current access and additional large bore IV); Monitoring (standard ASA); PONV Plan (dexamethasone and ondansetron); Pain (per surgical team); PostOp (PACU)Standard Attestation    Informed Consent     Risks:          Risks discussed were commensurate with the plan listed above with the following specific points: N/V and sore throat, Damage to:Marland Kitchen    Anesthetic Consent:         Anesthetic plan (and risks as noted above) were discussed with patient    Blood products Consent:        Use of blood products discussed with: patient and they consented    Plan also discussed with team members including:       CRNA    Responsible Anesthesia Provider Attestation:  I attest that the patient or proxy understands and accepts the risks and benefits of the anesthesia plan. I  also attest that I have personally performed a pre-anesthetic examination and evaluation, and prescribed the anesthetic plan for this particular location within 48 hours prior to the anesthetic as documented. Elberta Spaniel, MD  04/01/22, 7:14 AM

## 2022-04-01 NOTE — Anesthesia Case Conclusion (Signed)
CASE CONCLUSION  Emergence  Actions:  Suctioned and OPA  Criteria Used for Airway Removal:  Adequate Tv & RR and acceptable O2 saturation  Assessment:  Routine  Transport  Directly to: PACU  Airway:  Facemask  Oxygen Delivery:  6 lpm  Position:  Recumbent  Patient Condition on Handoff  Level of Consciousness:  Alert/talking/calm  Patient Condition:  Stable  Handoff Report to:  RN

## 2022-04-01 NOTE — Progress Notes (Addendum)
Patient:Gail Jensen  MRN: [id number]  DOA: 04/01/2022    Ortho Spine Progress Note for 04/01/2022    Events:   No acute events since surgery. Denies CP/SOB/N/V. Pain controlled in 23 hr unit.  Rigors post op - demerol given      Vitals:  Vitals:    04/01/22 1045 04/01/22 1100 04/01/22 1130 04/01/22 1147   BP: 107/64 111/60 116/55 (!) 137/91   BP Location: Right arm Right arm Right arm Right arm   Pulse: 88 82 90 108   Resp: 18 12 10 14    Temp:    36.4 C (97.5 F)   TempSrc:    Temporal   SpO2: 93% 100% 100% 98%   Weight:       Height:           Intake/output:  I/O this shift:  10/11 0700 - 10/11 1459  In: 1100 (15.7 mL/kg) [I.V.:1100]  Out: 25 (0.4 mL/kg) [Blood:25]  Net: 1075  Weight: 70 kg       Labs:  No results for input(s): WBC, HCT, CREAT, INR, PLT, K, GLU in the last 168 hours.      Physical Exam:  NAD, AAOx3  Lumbar Dressing c/d/i      RLE  4 hip flexion,4 ankle dorsiflexion, 4 great toe extension, 5 great toe flexion  SILT L2-S1 - Small toe remains numb.  Foot WWP    LLE  5 hip flexion,5 ankle dorsiflexion, 5 great toe extension, 5 great toe flexion  SILT L2-S1   Foot WWP    Assessment and Plan:  61 y.o. female admitted on 04/01/2022 now s/p Removal of right L4 cortical screw, replaced with 5.5/6.5x64mm screw  04/01/22 with Dr. Charlton Amor.    Post-op abx x 24hr  PCA for pain control  Diet: reg  IV fluids  Foley out POD 1  Drains until output low  PT/OT, activity as tolerated  Post-operative X-rays on day of discharge  DVT ppx: TEDs, SCDs; Ambulate  Disposition planning: Home       Candelaria Stagers, [place]  Orthopaedic Spine Surgery  12:57 PM 04/01/2022

## 2022-04-01 NOTE — Anesthesia Procedure Notes (Signed)
---------------------------------------------------------------------------------------------------------------------------------------    AIRWAY   GENERAL INFORMATION AND STAFF    Patient location during procedure: OR       Date of Procedure: 04/01/2022 8:10 AM  CONDITION PRIOR TO MANIPULATION     Current Airway/Neck Condition:  Normal        For more airway physical exam details, see Anesthesia PreOp Evaluation  AIRWAY METHOD     Patient Position:  Sniffing    Preoxygenated: yes      Maintained In-Line Stability: not needed, normal c-spine condition          To see details of medications used, see MAR    Induction: IV    Mask Difficulty Assessment:  1 - vent by mask      Mask NMB: 2 - vent by mask + OPA/NPA      Technique Used for Successful ETT Placement:  Direct laryngoscopy    Devices/Methods Used in Placement:  Intubating stylet    Blade Type:  Miller    Laryngoscope Blade/Video laryngoscope Blade Size:  2    Cormack-Lehane Classification:  Grade IIa - partial view of glottis    Placement Verified by: capnometry, auscultation, palpation of cuff and equal breath sounds      Number of Attempts at Approach:  1    Number of Other Approaches Attempted:  0  FINAL AIRWAY DETAILS    Final Airway Type:  Endotracheal airway    Adjunct Airway: soft bite block    Final Endotracheal Airway:  ETT      Cuffed: cuffed    Insertion Site:  Oral    ETT Size (mm):  7.0    Distance inserted from Lips (cm):  21  ADDITIONAL COMMENTS   Atraumatic intubation, dentition and mucosa intact as per baseline.  ----------------------------------------------------------------------------------------------------------------------------------------

## 2022-04-01 NOTE — Plan of Care (Signed)
Problem: Alteration in elimination  Goal: Adequate urinary output  Outcome: Progressing towards goal     Problem: Alteration in comfort related to surgical procedure  Goal: Pain is relieved or minimized  Description: Pain is relieved or minimized to equal or less than 5 on a 0-10 scale  Outcome: Progressing towards goal     Problem: Alteration in respiratory function  Goal: Sa02 > 92% or at pre-op level  Outcome: Maintaining     Problem: Impaired cardiovascular/circulatory function  Goal: BP, Pulse within 20% of baseline and Cardiac rhythm stable  Outcome: Maintaining     Problem: Impaired tissue perfusion  Goal: CMS and pulses WDL  Outcome: Maintaining     Problem: Alteration in mobility  related to anesthesia or surgery  Goal: Returning motor/sensory function  Description: When monitoring for returning motor/sensory function make sure:  -Epidural/spinal anesthesia level descending  -Monitor for hemodynamic compromise  Outcome: Maintaining     Problem: Alteration of skin integrity related to surgical procedure  Goal: Maintain skin integrity with incision line approximated  Outcome: Maintaining     Problem: Thermoregulation  Goal: Maintain body temperature of 36 C or above  Outcome: Maintaining     Problem: Anxiety related surgical procedure/anesthesia and findings  Goal: Anxiety under control or perceived as acceptable to patient  Outcome: Maintaining

## 2022-04-01 NOTE — Plan of Care (Signed)
Patient with good pain control with PRN dilaudid. On Room air, ready for next phase of care.  Problem: Alteration in respiratory function  Goal: Sa02 > 92% or at pre-op level  Reactivated     Problem: Alteration in comfort related to surgical procedure  Goal: Pain is relieved or minimized  Description: Pain is relieved or minimized to equal or less than 5 on a 0-10 scale  Reactivated

## 2022-04-01 NOTE — H&P (Signed)
HISTORY OF PRESENT ILLNESS     61 y.o. female returns to clinic today to review the results of her CT scan. She underwent L4-5 revision laminectomy and fusion on 03/16/22 for left lumbar radiculopathy. Her left leg pain improved following surgery, however she was having increased right leg pain postoperatively. Since I saw her last week, she reports that her symptoms are unchanged despite increasing her gabapentin dose and finishing the steroid taper. Denies fever/chills or other infectious symptoms.          REVIEW OF SYSTEMS:   Noncontributory for head, eyes, ear, nose, and throat, cardiac, pulmonary, gastrointestinal, genitourinary, integumentary, psychiatric, endocrine, neurologic and musculoskeletal systems.         PHYSICAL EXAMINATION:   Vitals       Vitals:     03/30/22 1405   BP: 144/65   Pulse: 65   Weight: 69.4 kg (153 lb)   Height: 1.727 m (5\' 8" )               Patient is  well-appearing and in no apparent distress.  They ambulate with a normal gait, no cane today.  RRR  Non-labored breathing  Incision is healing well with no surrounding erythema or drainage.  Nylon sutures are intact.      Overall posture and alignment are within normal limits. Tender to palpation in the right lumbar paraspinal muscles.   She has normal strength and sensation throughout the left lower extremity.  Dysesthesias in the right L5 distribution.  Trace weakness in the right tibialis anterior compared to the left side.  Extremities are warm and well-perfused without evidence of peripheral edema.        IMAGING:     CT lumbar spine from 03/27/22 was reviewed. Radiologist's report not available yet. Right L4 screw is positioned medially. Otherwise hardware position looks acceptable.     ASSESSMENT AND PLAN:     Gail Jensen has had right lumbar radiculopathy s/p L4-5 revision laminectomy and fusion. I think her right leg pain is a result of right L4 screw malposition. I would like to return to the OR to review the screw and we can add this  on to the white sheet trauma room on Wednesday 04/01/22. I reviewed the risks, benefits and goals of the surgery. I explained that I will try to reposition the cortical trajectory screw but if I cannot get acceptable position, then I can convert to a traditional pedicle screw. Worst case scenario I would leave the right sided hardware out and add more bone graft for fusion. Additional surgical risks are similar to her index procedure and include but are not limited to bleeding, infection, nerve injury, permanent numbness or weakness, persistent pain, CSF leak/dural tear, hardware failure or malpositioning, nonunion, adjacent level degeneration, need for further surgery, and medical/anesthesia complications.  She understands and wishes to proceed with surgery on Wednesday as discussed. She knows to call the office in the meantime with any new questions or concerns.     Follow up:Wednesday for surgery

## 2022-04-01 NOTE — Anesthesia Postprocedure Evaluation (Signed)
Anesthesia Post-Op Note    Patient: Gail Jensen    Procedure(s) Performed:  Procedure Summary  Date:  04/01/2022 Anesthesia Start: 04/01/2022  7:56 AM Anesthesia Stop: 04/01/2022 10:09 AM Room / Location:  [id number] / [facility] MAIN OR   Procedure(s):  Revision of right L4 screw, Globus (120 min) Diagnosis:  S/P lumbar fusion [Z98.1] Surgeon(s):  Evelena Asa, MD  Franne Grip, MD Responsible Anesthesia Provider:  Elberta Spaniel, MD         Recovery Vitals  BP: 116/55 (04/01/2022 11:30 AM)  Heart Rate: 90 (04/01/2022 11:30 AM)  Heart Rate (via Pulse Ox): 90 (04/01/2022 11:30 AM)  Resp: 10 (04/01/2022 11:30 AM)  Temp: 36.1 C (97 F) (04/01/2022 10:06 AM)  SpO2: 100 % (04/01/2022 11:30 AM)  O2 Flow Rate: 2 L/min (04/01/2022 11:30 AM)   0-10 Scale: 4 (04/01/2022 11:00 AM)    Anesthesia type:  general  Complications Noted During Procedure or in PACU:  None   Comment:    Patient Location:  PACU  Level of Consciousness:    Recovered to baseline  Patient Participation:     Able to participate  Temperature Status:    Normothermic  Oxygen Saturation:    Within patient's normal range  Cardiac Status:   within patient's normal range  Fluid Status:    Stable  Airway Patency:     Yes  Pulmonary Status:    Baseline  Pain Management:    Adequate analgesia  Nausea and Vomiting:  None    Post Op Assessment:    Tolerated procedure well  Responsible Anesthesia Provider Attestation:  All indicated post anesthesia care provided       -

## 2022-04-01 NOTE — Op Note (Signed)
Operative Note (Surgical Case/Log ID: [id number])       Date of Surgery: 04/01/2022       Surgeons: Surgeon(s) and Role:     * Evelena Asa, MD - Primary     * Franne Grip, MD - Assisting   Assistants:         Pre-op Diagnosis: Pre-Op Diagnosis Codes:     * S/P lumbar fusion [Z98.1]       Post-op Diagnosis: Post-Op Diagnosis Codes:     * S/P lumbar fusion [Z98.1]       Procedure(s) Performed: Procedure(s) (LRB):  Revision of right L4 screw, Globus (120 min) (N/A)  [id number]       Anesthesia Type: General        Fluid Totals: I/O this shift:  10/11 0700 - 10/11 1459  In: 1100 (15.7 mL/kg) [I.V.:1100]  Out: 25 (0.4 mL/kg) [Blood:25]  Net: 1075  Weight: 70 kg        Estimated Blood Loss: 25 mL       Specimens to Pathology:  * No specimens in log *       Implants: Removal of right L4 cortical screw, replaced with 5.5/6.5x63mm screw       Packing:  none               Patient Condition: good       Indications: Gail Jensen underwent L4-5 fusion with cortical trajectory screws on 03/16/22. Her preop left lumbar radiculopathy resolved, however she had new onset of right lumbar radiculopathy which was unresponsive to steroids and gabapentin. She had trace 4/5 TA strength on the right as well. CT showed a medially placed L4 cortical screw. Based on the CT scan, I recommended return to the OR for revision of the right L4 screw. I reviewed the risks and goals of surgery as outline in my clinic note. She expressed understanding and she agrees to proceed.       Findings (Including unexpected complications): Medially placed right L4 screw     Description of Procedure: Patient was seen and identified in the preoperative holding area.  The operative site was marked.  Patient was brought back to the operating room placed under general endotracheal anesthesia without complication.  Foley catheter was placed.  Patient was then transferred into the prone position on the [facility] spine table.  I ensured that all bony prominences were  well-padded.  Lumbar region was then shaved, prepped and draped in the usual sterile fashion.  Formal timeout was done prior to the start of the procedure and I confirmed that antibiotics were delivered and that SCDs were on throughout the case for DVT prophylaxis.   I removed the nylon sutures and incised the previous skin incision.  There is a superficial seroma but all tissues appeared healthy without signs of infection.  I incised the lumbar fascia and remove the previous sutures.  Wound was copiously irrigated.  Again all tissues looked healthy and free of signs of infection.  I placed a deep retractors and identified the previous hardware.  I removed the setscrews on the right at L4 and L5.  I removed the right L4 cortical screw.  I identified the pars area of L4.  Under AP fluoroscopic guidance I repositioned the starting point more superior and lateral to the old screw hole.  I then transition to a lateral x-ray.  Starting with the hand drill set at 25 mm I sequentially drilled up to 35 mm.  At 35 mm I broke  through the distal cortex.  I confirmed with a ball-tipped probe that there is no medial or lateral breaches.  I chose to place a 35 mm screw in the new trajectory.  I tapped the right L4 screw with a 5.5/6.5 mm tap.  I then placed the 5.5/6.5 mm x 35 mm screw.  The screw had excellent purchase.  I then used a Public house manager to clear out scar tissue in the right L4-5 lateral recess.  I confirmed that there is no further compression on the right L5 nerve root and at the foramen was open.  I did not palpate any screw threads medial to the right L4 pedicle.  I then placed a modular head of the new right L4 cortical screw.  I advanced it into final position.  I replaced the 40 mm rod and setscrews were placed and final tightened.  I copiously irrigated the wound.  I placed Floseal over the previous laminectomy bed.  Fascia was closed with running and interrupted #1 Vicryl suture.  Marcaine was used for  local anesthetic.  Subcutaneous tissues were closed with interrupted 2-0 Vicryl and skin was closed with interrupted 3-0 nylon sutures.  Skin was wiped down with Betadine solution.  Sterile dressings were placed.  Patient was then gently transferred back in the supine position on the stretcher.  All counts were correct x2.  She was extubated without complication.  As the attending surgeon of record I attest that I was present for the entire operation and there are no complications.      Signed:  Evelena Asa, MD  on 04/01/2022 at 12:30 PM

## 2022-04-01 NOTE — Invasive Procedure Plan of Care (Signed)
[facility]  [facility]  [facility]    CONSENT FOR MEDICAL  OR SURGICAL PROCEDURE                            Patient Name: Gail Jensen  [facility] [address]                                                              DOB: 12-26-1960           Please read this form or have someone read it to you.   It's important to understand all parts of this form. If something isn't clear, ask [place] to explain.   When you sign it, that means you understand the form and give [place] permission to do this surgery or procedure.     I agree for Orthopaedic Spine Surgery Attending Evelena Asa MD along with any assistants* they may choose, to treat the following condition(s): Right lumbar radiculopathy, medially place screw at L4   By doing this surgery or procedure on me: revision of right L4 screw   This is also known as: moving the right L4 to a different position    *if you'd like a list of the assistants, please ask. We can give that to you.    1. The care provider has explained my condition to me. They have told me how the procedure can help me. They have told me about other ways of treating my condition. I understand the care provider cannot guarantee the result of the procedure. If I don't have this procedure, my other choices are: No surgery/observation; Non-operative management    2. The care provider has told me the risks (problems that can happen) of the procedure. I understand there may be unwanted results. The risks that are related to this procedure include:   - Infection  - Blood loss  - Blood clots   - Dural tear  - Damage to blood vessels  - Problems with bowel and bladder  - Damage to nerves resulting in weakness, numbness, pain  - Persistent or incomplete relief of pain/sensory disturbance/weakness  - Temporary or permanent paralysis  - Risks of anesthesia up to and including stroke, blindness, death, pain, incomplete relief of pain or other symptoms   - Need for additional surgery  - Failure to  fuse/non-union  - Failure of instrumentation    - persistent leg pain, weakness    3. I understand that during the procedure, my care provider may find a condition that we didn't know about before the treatment started. Therefore, I agree that my care provider can perform any other treatment which they think is necessary and available.    4. I understand the care provider may remove tissue, body parts, or materials during this procedure. These materials may be used to help with my diagnosis and treatment. They might also be used for teaching purposes or for research studies that I have separately agreed to participate in. Otherwise they will be disposed of as required by law.    5. My care provider might want a representative from a medical device company to be there during my procedure. I understand that person works for:   [employer]  The ways they might help my care provider during my procedure include:        Helping the operating room staff prepare the special device my care provider wants to use and providing information and support to operating room staff regarding the device    6. Here are my decisions about receiving blood, blood products, or tissues. I understand my decisions cover the time before, during and after my procedure, my treatment, and my time in the hospital. After my procedure, if my condition changes a lot, my care provider will talk with me again about receiving blood or blood products. At that time, my care provider might need me to review and sign another consent form, about getting or refusing blood.    I understand that the blood is from the community blood supply. Volunteers donated the blood, the volunteers were screened for health problems. The blood was examined with very sensitive and accurate tests to look for hepatitis, HIV/AIDS, and other diseases. Before I receive blood, it is tested again to make sure it is the correct type.    My chances of getting a sickness from blood  products are small. But no transfusion is 100% safe. I understand that my care provider feels the good I will receive from the blood is greater than the chances of something going wrong. My care provider has answered my questions about blood products.      My decision  about blood or  blood products   Yes, I agree to receive blood or blood products if my care provider thinks they're needed.       My decision   about tissue  Implants     Yes, I agree to receive tissue implants if my care provider thinks they're needed.   - tissues may include: cavaderic bone graft.       I understand this  form.    My care provider  or his/her  assistants have  explained:   What I am having done and why I need it.  What other choices I can make instead of having this done.  The benefits and possible risks (problems) to me of having this done.  The benefits and possible risks (problems) to me of receiving transplants, blood, or blood products.  There is no guarantee of the results.  The care provider may not stay with me the entire time that I am in the operating or procedure room.  My provider has explained how this may affect my procedure. My provider has answered my  questions about this.         I give my  permission for  this surgery or  procedure.            _______________________________________________                                     My signature  (or parent or other person authorized to sign for you, if you are unable to sign for  yourself or if you are under 20 years old)    In to order prevent possible spread of disease, this form was completed with a clear verbal understanding by the patient and/or family/care providers and physical signature was not obtained.        04/01/2022  ______           Date  _____        Time   Electronic Signatures will display at the bottom of the consent form.    Care provider's statement: I have discussed the planned procedure, including the possibility for transfusion of  blood  products or receipt of tissue as necessary; expected benefits; the possible complications and risks; and possible alternatives  and their benefits and risks with the patients or the patient's surrogate. In my opinion, the patient or the patient's surrogate  understands the proposed procedure, its risks, benefits and alternatives.              Electronically signed by: Evelena Asa, MD                             04/01/2022          Date        5:16 AM          Time   Your doctor or someone your doctor has appointed has told you that you may need blood or a blood product transfusion, which has been collected from volunteers, as part of your treatment as a patient.    The reasons you might need blood or blood products include, but are not limited to:    Significant loss of your own blood   Your body may not be getting enough oxygen to its tissues    Treatment of bleeding disorders caused by low platelet counts or platelets that do not work right (platelets are part of a cell that helps to form clots and keeps you from bleeding too much).   You may not have enough of other substances that help your blood clot or stop you from bleeding more  The risks of getting a transfusion of blood or blood products include, but are not limited to:    Damage to the lungs   Difficulty breathing due to fluid in the lungs   The product may contain bacteria or rarely a virus (which includes HIV and Hepatitis)   Blood from the community blood supply has been collected from volunteer donors who have been screened for health risk. The blood has been tested for major blood transmitted disease, but no transfusion is 100% safe. The blood is tested with very sensitive and accurate tests to screen for hepatitis, AIDS, and other disease, which makes the risks very small.   You may have side effects from the transfusion (rash, fever, chills) or an allergic reaction   The transfusion increases your risks of getting infection or  cancer coming back   The transfusion can increase the time you have to stay in the hospital   The transfusion can potentially cause death if the wrong blood is given or your body rejects the blood   Before blood is transfused, it is tested again to make sure it is the correct type  There are other options than getting blood or blood products from other people and they include:    Drugs which can decrease bleeding   Drugs which can cause your body to make more blood (used in elective procedures with advance notice)   Autologous (your own blood) donation (needs pre-arrangement)   No transfusion  If you exercise your right to refuse to be transfused with blood or blood products; these things listed below, among others, could happen to you:   Your body may not get enough oxygen and suffer damage   You may  have a higher chance of bleeding   You may limit other options for your condition   You may die from losing too much blood    In to order prevent possible spread of disease, this form was completed with a clear verbal understanding by the patient and/or family/care providers and physical signature was not obtained.

## 2022-04-01 NOTE — INTERIM OP NOTE (Signed)
Procedure performed: Revision of L4 screw.    Planned return to OR: None.  Antibiotic regimen: Ancef 24 hr.  Pain Management: oxy 5/10, Dilaudid 0.5 q4hr  DVT prophylaxis: Ambulation.  Weight bearing status and activity restrictions: WBAT.  Wound Care: Dressing to stay in place.  Diet: Regular.  X-rays and imaging: None.  Additional Specifics (as needed): Zanaflex for muscle spasms  Intra-op clinical photos when pertinent (pre-op photos should be in H+P): None.  Anticipated outpatient clinic follow-up date: Two week follow with Dr. Charlton Amor. Week of October 30th.     Interim Op Note (Surgical Log ID: [id number])       Date of Surgery: 04/01/2022       Surgeons: Surgeon(s) and Role:     * Evelena Asa, MD - Primary     * Franne Grip, MD - Assisting   Assistants:         Pre-op Diagnosis: Pre-Op Diagnosis Codes:     * S/P lumbar fusion [Z98.1]       Post-op Diagnosis: Post-Op Diagnosis Codes:     * S/P lumbar fusion [Z98.1]       Procedure(s) Performed: Procedure(s) (LRB):  Revision of right L4 screw, Globus (120 min) (N/A)       Anesthesia Type: General        Fluid Totals: I/O this shift:  10/11 0700 - 10/11 1459  In: 1000 (14.3 mL/kg) [I.V.:1000]  Out: - (0 mL/kg)   Net: 1000  Weight: 70 kg        Estimated Blood Loss: * No values recorded between 04/01/2022  7:56 AM and 04/01/2022  9:58 AM *       Specimens to Pathology:  * No specimens in log *       Temporary Implants:        Packing:                 Patient Condition: good       Findings (Including unexpected complications): See OP note.     Signed:  Franne Grip, MD  on 04/01/2022 at 9:58 AM

## 2022-04-01 NOTE — Interval H&P Note (Signed)
UPDATES TO PATIENT'S CONDITION on the DAY OF SURGERY/PROCEDURE    I. Updates to Patient's Condition (to be completed by a provider privileged to complete a H&P, following reassessment of the patient by the provider):    Day of Surgery/Procedure Update:  History  History reviewed and no change    Physical  Physical exam updated and no change            II. Procedure Readiness   I have reviewed the patient's H&P and updated condition. By completing and signing this form, I attest that this patient is ready for surgery/procedure.    III. Attestation   I have reviewed the updated information regarding the patient's condition and it is appropriate to proceed with the planned surgery/procedure.      Evelena Asa, MD as of 7:16 AM 04/01/2022

## 2022-04-02 ENCOUNTER — Encounter: Payer: Self-pay | Admitting: Orthopaedic Surgery

## 2022-04-02 ENCOUNTER — Other Ambulatory Visit: Payer: BLUE CROSS/BLUE SHIELD | Admitting: Radiology

## 2022-04-02 MED ORDER — OXYCODONE HCL 5 MG PO TABS *I*
ORAL_TABLET | ORAL | Status: AC
Start: 2022-04-02 — End: 2022-04-02
  Filled 2022-04-02: qty 1

## 2022-04-02 MED ORDER — OXYCODONE HCL 5 MG PO TABS *I*
5.0000 mg | ORAL_TABLET | Freq: Once | ORAL | Status: AC
Start: 2022-04-02 — End: 2022-04-02
  Administered 2022-04-02: 5 mg via ORAL

## 2022-04-02 MED ORDER — OXYCODONE HCL 10 MG PO TABS *I*
ORAL_TABLET | ORAL | Status: AC
Start: 2022-04-02 — End: 2022-04-02
  Filled 2022-04-02: qty 1

## 2022-04-02 MED ORDER — CEFAZOLIN 2000 MG IN STERILE WATER 20ML SYRINGE *I*
PREFILLED_SYRINGE | INTRAVENOUS | Status: AC
Start: 2022-04-02 — End: 2022-04-02
  Filled 2022-04-02: qty 20

## 2022-04-02 MED ORDER — OXYCODONE HCL 10 MG PO TABS *I*
10.0000 mg | ORAL_TABLET | ORAL | 0 refills | Status: DC | PRN
Start: 2022-04-02 — End: 2022-09-23

## 2022-04-02 MED ORDER — SENNOSIDES 8.6 MG PO TABS *I*
ORAL_TABLET | ORAL | Status: AC
Start: 2022-04-02 — End: 2022-04-02
  Filled 2022-04-02: qty 2

## 2022-04-02 MED ORDER — ACETAMINOPHEN 500 MG PO TABS *I*
ORAL_TABLET | ORAL | Status: AC
Start: 2022-04-02 — End: 2022-04-02
  Filled 2022-04-02: qty 2

## 2022-04-02 MED ORDER — PANTOPRAZOLE SODIUM 40 MG PO TBEC *I*
DELAYED_RELEASE_TABLET | ORAL | Status: AC
Start: 2022-04-02 — End: 2022-04-02
  Filled 2022-04-02: qty 1

## 2022-04-02 NOTE — Discharge Summary (Signed)
Name: Shelisa Grist MRN: [id number] DOB: [birth year]     Admit Date: 04/01/2022   Date of Discharge: 04/02/2022     Patient was accepted for discharge to   Home or Self Care [1]           Discharge Attending Physician: Evelena Asa, MD      Hospitalization Summary    CONCISE NARRATIVE: Admitted via SDA.  Uncomplicated postop course on 23 hour unit.  Pain eventually controlled on oral medications.  Voiding without difficulty and tolerating regular diet. Cleared by Physical Therapy.  Ready for discharge on POD # 1.            OR PROCEDURE: 04/01/22  Revision of L4 screw.            SIGNIFICANT MED CHANGES: Yes  Oxycodone       Signed: Candelaria Stagers, PA  On: 04/02/2022  at: 10:47 AM

## 2022-04-02 NOTE — Discharge Instructions (Signed)
We understand that pain management is an important part of your surgical recovery. We are committed to keeping you comfortable within the following guidelines:   We require 48 hour notice for all refills.   No refills will be granted during off hours, weekends, or holidays. Please plan ahead if you are running low on your medications.    Medications will be discontinued at a specific date depending upon your surgery and/ or injury. This will be discussed at the time of your pre-operative evaluation or at your first post-op appointment.      Call:  Call your Surgeon's office promptly if you experience any of these symptoms:  fever > 101.5 F, redness around surgical site, drainage from incision, change in numbness or tingling or weakness, inability to urinate or have a bowel movement, chills, night sweats, pain not relieved by medications.    If you cannot reach your Surgeon, then call the Answering Service (on-call phone number:  [phone]).  If you experience a medical emergency (such as chest pain or shortness of breath) then proceed directly to the Emergency Department.       Activity:  Walk for activity. No stretching or strengthening. No lifting.  No excessive or repetitive bending or twisting.  Do not drive until instructed that you may do so by your physician.    Medications: Take tylenol for pain as directed. For increased pain, take your Oxycodone as directed. If you are taking Oxycodone, take a stool softener every day.      Incision/Wound Care:  Dressing changes every 2-3 days, or if the dressing is not clean, white and dry or after shower if the dressing gets wet.   You may shower while incision is covered.  No tub baths or swimming.    Dr. Charlton Amor:  appointments [phone]    DISCHARGE PLAN AND INSTRUCTIONS AFTER SPINAL SURGERY    o Upon discharge from the hospital, you can shower with the incision closed with occlusive dressing. No direct water flow onto the incision. After shower, remove the  dressing, pad the incision dry and change your dressings.    No soaking in a bathtub or swimming pool and no swimming until you are cleared to do so during your clinic visits after surgery.    Please call the office if concern for wound drainage, redness, or other signs of infection    Please call the office if symptoms of fevers or temperature greater than 101.5 F.    We urge you to follow-up with your primary care physician with 2-3 weeks after surgery to ensure all your medications and medical conditions including diabetes, high blood pressure, rheumatoid arthritis etc. are appropriate managed as they may change after surgery.    We encourage to walk daily and avoid excessive twisting of your spine (neck and low back), avoid running and jumping until you are cleared to do so by your surgeon.    Please follow discharge instructions and only take your medications as prescribed upon discharge        If symptoms of chest pain or shortness of breath, please call [phone]

## 2022-04-02 NOTE — Plan of Care (Signed)
Problem: Impaired cardiovascular/circulatory function  Goal: BP, Pulse within 20% of baseline and Cardiac rhythm stable  Outcome: Maintaining     Problem: Impaired tissue perfusion  Goal: CMS and pulses WDL  Outcome: Maintaining     Problem: Alteration of skin integrity related to surgical procedure  Goal: Maintain skin integrity with incision line approximated  Outcome: Maintaining     Problem: Thermoregulation  Goal: Maintain body temperature of 36 C or above  Outcome: Maintaining     Problem: Anxiety related surgical procedure/anesthesia and findings  Goal: Anxiety under control or perceived as acceptable to patient  Outcome: Maintaining     Problem: Alteration in respiratory function  Goal: Sa02 > 92% or at pre-op level  Outcome: Completed or Resolved     Problem: Alteration in mobility  related to anesthesia or surgery  Goal: Returning motor/sensory function  Description: When monitoring for returning motor/sensory function make sure:  -Epidural/spinal anesthesia level descending  -Monitor for hemodynamic compromise  Outcome: Completed or Resolved     Problem: Alteration in elimination  Goal: Adequate urinary output  Outcome: Completed or Resolved     Problem: Alteration in comfort related to surgical procedure  Goal: Pain is relieved or minimized  Description: Pain is relieved or minimized to equal or less than 5 on a 0-10 scale  Outcome: Completed or Resolved

## 2022-04-02 NOTE — Progress Notes (Signed)
Orthopaedic Spine Surgery Progress Note    Patient: Gail Jensen  MRN: [id number]  DOA: 04/01/2022  Ortho Progress Note for 04/02/2022    Subjective:   NAEON. VSS. ON RA. Pain is well controlled. Voiding spontaneously. +Flatus. States the the numbness and tingling in her leg is resolved since pre op. Endorsing right anterior ankle numbness and tingling. Denies any CP, SOB, saddle anesthesia, or new numbness or tingling.     Objective:  Vital Signs    Current Vitals Vitals Range (24 hours)   BP 113/70 (BP Location: Right arm)   Pulse 69   Temp 36.5 C (97.7 F) (Temporal)   Resp 16   Ht 1.73 m (5' 8.11")   Wt 70 kg (154 lb 5.2 oz)   SpO2 96%   BMI 23.39 kg/m  BP: (106-141)/(50-91)   Temp:  [36.1 C (97 F)-36.8 C (98.2 F)]   Temp src: Temporal (10/12 0435)  Heart Rate:  [69-108]   Resp:  [10-23]   SpO2:  [93 %-100 %]   Height:  [173 cm (5' 8.11")]   Weight:  [70 kg (154 lb 5.2 oz)]      Physical Exam    General  Alert and interactive, no acute distress  Unlabored breathing on RA.    Spine  Dressing clean, dry, and intact.    Neurological Examination    Feels that some of her hip weakness is attributed to pain over the area.  Motor Function RIGHT LEFT   Shoulder abduction 5 5   Elbow flexion 5 5   Wrist extension 5 5   Elbow extension 5 5   Wrist flexion 5 5   Grip strength 5 5   Finger abduction (interossei) 5 5   Hip flexion 4 5   Knee extension 4 5   Ankle dorsiflexion 4 5   Hallux extension 4 5   Ankle plantarflexion 5 5        Sensory Exam  Sensation intact to light touch in all dermatomes in the bilateral upper extremities and bilateral lower extremities except the anterior aspect of the right ankle.    Extremities  Circulation: Distal extremities well-perfused. 2+ radial pulses bilateral. Toes WWP.    Imaging:  None new.    Assessment and Plan:  61 y.o. female s/p L4-5 PSF and decompression on 9/25. Now s/p revision of R L4 screw on 10/11.     Planned return to OR: None.  Antibiotic regimen: Ancef 24  hr.  Pain Management: oxy 5/10, Dilaudid 0.5 q4hr  DVT prophylaxis: Ambulation.  Weight bearing status and activity restrictions: WBAT.  Wound Care: Dressing to stay in place.  Diet: Regular.  X-rays and imaging: None.  Additional Specifics (as needed): Zanaflex for muscle spasms  Anticipated outpatient clinic follow-up date: Two week follow with Dr. Charlton Amor. Week of October 30th.      Franne Grip, MD   Orthopaedic Surgery Resident  04/02/2022 at 5:12 AM

## 2022-04-02 NOTE — Progress Notes (Signed)
Occupational Therapy Evaluation        Discharge Recommendations:  Prior Living Environment, Intermittent assist LB dressing/bathing, Assist with IADLs, No Further Skilled Acute OT Needs    OT Discharge Equipment Recommended: (P) None     Hospital Stay Recommendations:  Encourage active participation with ADLs, Toilet transfer status independent, and OOB for meals           HPI:  Admitting Dx: There are no active hospital problems to display for this patient.      PMH: No past medical history on file.    PSH:   Past Surgical History:   Procedure Laterality Date    BREAST ENHANCEMENT SURGERY Bilateral 11/1998    CESAREAN SECTION, UNSPECIFIED  1996    LAMINECTOMY  08/2021    PR ARTHRODESIS COMBINED TQ 1NTRSPC LUMBAR N/A 03/16/2022    Procedure: L4-5 TLIF;  Surgeon: Evelena Asa, MD;  Location: [facility] MAIN OR;  Service: Orthopedics    PR REINSERTION SPINAL FIXATION DEVICE N/A 04/01/2022    Procedure: Revision of right L4 screw, Globus (120 min);  Surgeon: Evelena Asa, MD;  Location: [facility] MAIN OR;  Service: Orthopedics       ASSESSMENT       04/02/22 0810   Visit Details [facility])   Visit Type ([facility]) Eval-General   Tx Prioritization 4 - Normal   [facility] OT Tracking   [facility] OT Tracking OT Discontinue   Current Pain Assessment   Pain Assessment / Reassessment Assessment    0-10 Scale 7   (T) Pain Intervention(s) for current pain Repositioned   Plan and Onset date   Plan of Care Date 04/02/22   Treatment Start Date 04/02/22   OT Last Visit   Visit (#)  1   Precautions   Precautions used Yes   Home Living (Prior to Admission)   Prior Living Situation Reported by patient   Type of Home 2 Story home   # Steps to Enter Home 2   # Of Steps In Home 12   Location of Bedroom Second floor   Location of Bathroom Second floor   Bathroom Shower/Tub Tub/Shower unit   Current Home Equipment Toilet seat (raised w/arms);Transfer tub bench;Walker (rolling);Cane (straight)   Additional Comments Above setup is that of pt's sister's home where  she has been staying since last admission several weeks ago. She plans to stay there as needed upon this discharge. She reports she has a shower chair available to her however she has been standing with supervision during bathing tasks.   Prior Function   Prior Function Reported by patient   Level of Independence Independent with ADLs;Independent ambulation;Independent with homemaking with ambulation;Driving;Working   Lives With [employer] Help From Family   IADL Needs assistance   Additional Comments Pt reports her sister has been assisting her with LB dressing, supervision for ADLs and assist all IADLs since her last admission. She has been ambulatory without DME most of the time but intermittently used cane when pain increased. Reports will continue to have assistance from her sister as needed upon d/c   Vision    Additional Comments   (wears lenses for reading/distance, denies changes in vision)   Cognition   Cognition No deficit noted   Medication Management   Medication Management System Yes   Additional Comments independent with pillbox use   Perception   Perception No deficit noted   Coordination   Coordination Finger Opposition   Finger Opposition Within Normal Limits  Sensation   Sensation No apparent deficit  (reports intermittent paresthesia throughout posterior RLE, not currently experiencing)   UE Assessment   UE Assessment Full AROM RUE;Full AROM LUE   Bed Mobility   Additional Comments N/T, left seated at EOB at end of session   Functional Transfers   Sit to Stand Independent   Stand to Sit Independent   Functional Mobility Independent   Additional Comments Pt demonstrates independence with all functional transfers and independence for mobility without use of device. However, mobility noted to be slow and cautious, pt intermittently reaching for environmental items as a cautionary measure.   Balance   Sitting - Static Independent    Sitting - Dynamic Independent   Standing - Static  Independent   Standing - Dynamic Independent   ADL Assessment   Eating Independent   Grooming Independent   UE Dressing Independent   LE Dressing Moderate Assist   Bathing Minimal Assist;Other (Comment)  (estimate)   Toileting Independent  (simulated)   Additional Comments Pt requires mod A for LB dressing this date for assist with distal aspects (threading pants/donning socks) as she is unable to achieve figure four position due to pain. Reports has had assistance with this and will continue to have assist, not interested in DME   Activity Tolerance   Endurance Endurance does not limit participation in activity   OT Functional Outcome Measures   Functional Outcome Measures Yes   OT AM-PAC Self Care   Putting on and taking off regular lower body clothing? 2   Bathing (including washing, rinsing, drying)? 3   Toileting, which includes using toilet, bedpan, or urinal? 4   Putting on and taking off regular upper body clothing? 4   Taking care of personal grooming such as brushing teeth? 4   Eating Meals 4   Total Raw Score 21   CMS Score - Calculated 32.79%   Assessment   Assessment Impaired ADL status;Impaired endurance;Impaired self-care transfers;Impaired instrumental ADL's   Plan   OT Frequency One-time visit   Patient Will Benefit From ADL retraining;Functional transfer training;Compensatory technique education;IADL training;Exercises   No acute OT needs Pt demonstrates adequate ADL skills for return to prior living environment   Additional Comments Pt is functioning below level of baseline, however demonstrates adequate independence and safety to d/c home with caregiver assist once medically appropriate. Pt requires assistance with LB dressing and bathing, and assist with IADL tasks. No further acute OT needs identified.   Multidisciplinary Communication   Multidisciplinary Communication RN, pt, PT, APP   Recommendation   OT Discharge Recommendations Prior Living Environment;No further OT;Supervision during ADLs    OT Discharge Equipment Recommended None   OT needs to see patient prior to DC  No          OCCUPATIONAL THERAPY PROVIDER     Electronically Signed By:   Lawerance Cruel, OT    Please contact the OT via Secure Chat to: [facility] Occupational Therapy First Call with any questions/concerns and/or update requests.    Timed Calculations:  Timed Codes:  0  Untimed Codes: 12 minute OT eval   Unbilled Time: 0  Total Time:  12 minutes     OT EVALUATION COMPLEXITY     1.  Occupational Profile & History (Medical/Therapy)   Low (Brief Review)    2.  Public relations account executive (Includes occupations from: ADL, IADL, Rest/Sleep, Education, Work, Copywriter, advertising, Leisure, Social Participation)   Low (1-3 occupations/performance deficits)    3.  Clinical Decision Making  i  Assessment    Low (Problem-focused)   ii  Co-Morbidities    Low (None)   iii  Modifications    Low (None)   iv Treatment Options (Approaches include: Create, Promote, Establish, Restore, Maintain, Modify, Prevent)    Low (Limited)     Low    4.  EVALUATION COMPLEXITY as based on the above provided information   Low

## 2022-04-02 NOTE — Progress Notes (Signed)
Physical Therapy Initial Evaluation    Therapy Recommendations:  Discharge Recommendations:  Discharge to Planned Living Arrangement with Caregiver Assistance:     Patient's mobility is currently not a barrier to discharge. No further PT visits needed. .   Recommendations:   PT Discharge Equipment Recommended: (P) None   Additional justification:   N/A     PT Mobility Recommendations: (P) IND, amb ad lib  PT Referral Recommendations: (P) OT    History of Present Admission: 61 y.o. female s/p L4-5 PSF and decompression on 9/25. Now s/p revision of R L4 screw on 10/11.     No past medical history on file.     Past Surgical History:   Procedure Laterality Date    BREAST ENHANCEMENT SURGERY Bilateral 11/1998    CESAREAN SECTION, UNSPECIFIED  1996    LAMINECTOMY  08/2021    PR ARTHRODESIS COMBINED TQ 1NTRSPC LUMBAR N/A 03/16/2022    Procedure: L4-5 TLIF;  Surgeon: Evelena Asa, MD;  Location: [facility] MAIN OR;  Service: Orthopedics       Personal factors affecting treatment/recovery:  None identified    Comorbidities affecting treatment/recovery:  none noted    Clinical presentation:  stable    Patient complexity:    low level as indicated by above stability of condition, personal factors, environmental factors and comorbidities in addition to their impairments found on physical exam.       04/02/22 0820   Prior Living    Prior Living Situation Reported by patient   Lives With Alone   Type of Home 2 Story Home   # Steps to Enter Home 4   # Rails to Enter Home 2   # Of Steps In Home 12   # Rails in Home 1   Location of Bedrooms 2nd floor   Location of Bathrooms 1st floor;2nd floor   Bathroom Accessibility Tub/Shower unit   Current Home Equipment Transfer tub bench;Toilet seat (raised w/arms);Walker (rolling);Cane (straight)   Additional Comments patient has been staying at her sisters house since initial surgery, sister has [facility] with 2 STE with no HR, flight to second floor where bed/bathroom is however she can stay on  first floor on couch with half bath if needed. sisters house also has raised toilet set and shower chair. sister is going to work from home at the patients house when she returns home   Prior Function Level   Prior Function Level Reported by patient   Transfers Independent   Audiological scientist None   Walking Independent   Walking assistive devices used None   Stair negotiation Independent   Additional Comments patient states due to increased pain in RLE, she was using a cane at her sisters  house for stability. normally IND with no AD   PT Tracking   [facility]) PT TRACKING PT Discontinue   Visit Number   Visit Number [facility]) / Treatment Day (HH) 0   Visit Details [facility])   Visit Type [facility]) Evaluation   Precautions/Observations   Precautions used Yes   Fall Precautions General falls precautions   Activity Orders Present Yes   LDA Observation None   Vital Signs Response with Therapy stable   Was patient wearing a mask? No   PPE worn by Clinical research associate [facility]   Patient Subjective Pt agreeable to PT   Current Pain Assessment   Pain Assessment / Reassessment Assessment   Pain Scale 0-10 (Numeric Scale for Pain Intensity)    0-10 Scale 7   Pain Location/Orientation Back  Pain Descriptors Aching;Sore;Discomfort   Vision    Current Vision Adequate for PT session   Communication   Communication Communication Style   Communication Style Verbal   Cognition   Cognition Tested   Arousal/Alertness Appropriate responses to stimuli   Orientation A&Ox4   Ability to Follow Instructions Follows all commands and directions without difficulty   UE Assessment   Assessment Focus   (BUE ROM and strength WFL)   LE Assessment   Assessment Focus   (LLE ROM and strength WFL, RLE limited due to pain and feeling of "heaviness" however functional throughout session for transfers/amb/stairs)   Bed Mobility   Bed mobility Tested   Supine to Sit Independent   Sit to Supine Independent   Additional comments no assist needed, able to recall log roll   Transfers    Transfers Tested   Sit to Stand Independent   Stand to sit Independent   Transfer Assistive Device none   Additional comments patient requires increased time throughout transitional movements such as sitting<>standing due to increased pain however no physical assist required.   Mobility   Mobility: Gait/Stairs Tested   Gait Pattern Decreased cadence   Ambulation Assist Independent   Ambulation Distance (Feet) 80'x2   Ambulation Assistive Device None   Stairs Assistance Independent   Stair Management Technique One rail;Step to pattern   Number of Stairs 7   Additional comments patient tolerated all amb and stairs with no assist needed this date, ambulates slowly due to pain however demonstrates good stability and safety with mobilization. patient able to ascend/descend stairs with step to gt pattern, VC for ascending with strong leg and descending with weaker leg.   Training and Education   Patient pt edu on role of skilled PT, safety with all mobility, basic spinal precautions, and d/c planning   Balance   Balance Tested   Sitting - Static Independent   Sitting - Dynamic Independent   Standing - Static Independent   Standing - Dynamic Independent   PT AM-PAC Mobility   Turning over in bed? 4   Moving from lying on back to sitting on the side of the bed? 4   Moving to and from a bed to a chair? 4   Sitting down on and standing up from a chair with arms? 4   Need to walk in hospital room? 4   Climbing 3 - 5 steps with a railing? 4   Total Raw Score 24   AM-PAC T-Scale Score 57.68   Assessment   Brief Assessment Patient demonstrates adequate mobility to discharge to planned living arrangement   Patient / Family Goal get home, decrease pain   Plan/Recommendation   PT Treatment Interventions No further PT interventions   PT Frequency none further   PT Mobility Recommendations IND, amb ad lib   PT Referral Recommendations OT   PT Discharge Recommendations Intermittent supervision/assist;Home   PT Discharge Equipment  Recommended None   Transportation Recommendations family vehicle   PT Assessment/Recommendations Reviewed With: Patient;Nursing;Advanced Practice Provider;Physician   Next PT Visit n/a   PT needs to see patient prior to DC  No   Time Calculation   Total Time Therapeutic Activities (minutes) 0   Total Time Gait Training (minutes) 0   Total Time Therapeutic Exercises (minutes) 0   Total Time Neuromuscular Re-education (minutes) 0   Total Time Group Therapy (minutes) 0   PT Timed Codes 0   PT Untimed Codes 10   PT Unbilled Time 0  PT Total Treatment 10   Plan and Onset date   Plan of Care Date 04/02/22   Onset Date 04/01/22   Treatment Start Date 04/02/22     Lauris Poag, PT, DPT    Please contact physical therapist on the treatment team via secure chat or via the secure chat group for your unit Physical Therapist with any questions.  On weekends, please utilize the secure chat group, [facility] Physical Therapy 1st call, to contact PT.

## 2022-04-04 ENCOUNTER — Encounter: Payer: Self-pay | Admitting: Orthopedic Surgery

## 2022-04-04 ENCOUNTER — Encounter: Payer: Self-pay | Admitting: Orthopaedic Surgery

## 2022-04-06 ENCOUNTER — Encounter: Payer: Self-pay | Admitting: Orthopedic Surgery

## 2022-04-06 MED ORDER — PREDNISONE 20 MG PO TABS *I*: ORAL_TABLET | ORAL | 0 refills | Status: DC

## 2022-04-08 ENCOUNTER — Ambulatory Visit: Payer: BLUE CROSS/BLUE SHIELD | Admitting: Orthopaedic Surgery

## 2022-04-11 ENCOUNTER — Encounter: Payer: Self-pay | Admitting: Family Medicine

## 2022-04-11 ENCOUNTER — Other Ambulatory Visit: Payer: Self-pay | Admitting: Obstetrics and Gynecology

## 2022-04-11 ENCOUNTER — Other Ambulatory Visit: Payer: Self-pay | Admitting: Orthopedic Surgery

## 2022-04-13 MED ORDER — ESTRADIOL 0.0375 MG/24HR TD PTTW *A*
1.0000 | MEDICATED_PATCH | TRANSDERMAL | 0 refills | Status: DC
Start: 2022-04-13 — End: 2022-07-01

## 2022-04-13 MED ORDER — RABEPRAZOLE SODIUM 20 MG PO TBEC *I*
20.0000 mg | DELAYED_RELEASE_TABLET | Freq: Two times a day (BID) | ORAL | 5 refills | Status: DC
Start: 2022-04-13 — End: 2022-11-02

## 2022-04-13 NOTE — Telephone Encounter (Signed)
Gail Jensen patient- patient requesting refill of estradiol 0.0375 mg patch as a 90 day supply. Last seen 03/11/22 for NPV, switched to estradiol patch at that time from estratest. Is to follow up in 6 months for well woman exam. 3 month supply pended, thank you

## 2022-04-15 ENCOUNTER — Ambulatory Visit: Payer: BLUE CROSS/BLUE SHIELD | Admitting: Orthopaedic Surgery

## 2022-04-15 ENCOUNTER — Other Ambulatory Visit: Payer: Self-pay | Admitting: Gastroenterology

## 2022-04-16 ENCOUNTER — Ambulatory Visit
Admission: RE | Admit: 2022-04-16 | Discharge: 2022-04-16 | Disposition: A | Discharge status: Home / Self Care | Payer: BLUE CROSS/BLUE SHIELD | Source: Ambulatory Visit

## 2022-04-16 ENCOUNTER — Other Ambulatory Visit: Payer: Self-pay

## 2022-04-16 ENCOUNTER — Ambulatory Visit: Payer: BLUE CROSS/BLUE SHIELD | Attending: Orthopaedic Surgery | Admitting: Orthopaedic Surgery

## 2022-04-16 VITALS — BP 113/53 | HR 69 | Ht 68.0 in | Wt 154.0 lb

## 2022-04-16 DIAGNOSIS — Z981 Arthrodesis status: Secondary | ICD-10-CM

## 2022-04-16 DIAGNOSIS — Z4789 Encounter for other orthopedic aftercare: Secondary | ICD-10-CM

## 2022-04-16 NOTE — Progress Notes (Signed)
HISTORY OF PRESENT ILLNESS    Gail Jensen presents to clinic today for first postop visit. They underwent revision of right L4 cortical screw on 04/01/2022.  She called the office last week with persistent severe radicular symptoms down the right leg, however today in the office she says that her leg pain is now 70% improved compared to last week.  She has some stiffness in her lower back.  She feels the strength in her right ankle is improving as well.  Still denies any left leg pain.  Currently taking Tylenol, gabapentin and Diclofenac for pain control. No issues with her incision.       REVIEW OF SYSTEMS:   Noncontributory for head, eyes, ear, nose, and throat, cardiac, pulmonary, gastrointestinal, genitourinary, integumentary, psychiatric, endocrine, neurologic and musculoskeletal systems.       PHYSICAL EXAMINATION:   Vitals:    04/16/22 1105   BP: 113/53   Pulse: 69   Weight: 69.9 kg (154 lb)   Height: 1.727 m (5\' 8" )       Patient is  well-appearing and in no apparent distress.  They ambulate with a normal gait, no assist device.  Incision is healing well with no surrounding erythema or drainage.  Nylon sutures are intact.   Overall posture and alignment are within normal limits.    No gross motor or sensory deficits throughout the left lower extremity.  She has trace weakness in the right TA compared to the left, which is improved following her screw revision.  Extremities are warm and well-perfused without evidence of peripheral edema.       IMAGING:    Upright lumbar spine x-rays were obtained in the office today and personally reviewed.  Overall alignment is within normal limits.  No evidence of acute hardware failure.  Position of the right L4 cortical screw was improved.    ASSESSMENT AND PLAN:    Status post revision of right L4 cortical screw, doing well with improvement of her right lower extremity radiculopathy.  I counseled her that nerve recovery can happen for up to a year postoperatively and  I am very encouraged that she has made such tremendous progress in the first 2 weeks.  We will continue to monitor her strength exam.  Nylon sutures were removed in the office today.  She can leave the incision area open to air and shower and bathe normally.    Follow up:  Approximately 4 weeks, at that point we will likely start some outpatient physical therapy    X-rays/studies at follow up:  None      Evelena Asa, MD  Orthopaedic Spine Surgeon  [facility]    Voice recognition software was used to create this note. Please excuse any typos.    Answers submitted by the patient for this visit:  Right leg (Submitted on 04/11/2022)  Chief Complaint: Right leg pain  Date of onset: : 03/17/2022  Was this the result of an injury?: No  What is your pain level?: 6/10  Please describe the quality of your pain: : radiating  What diagnostic workup have you had for this condition?: CT scan  What treatments have you tried for this condition?: acetaminophen, gabapentin, heat, oral steroids  Is this a work related condition? : No  Current work status: : no work

## 2022-04-17 ENCOUNTER — Encounter: Payer: Self-pay | Admitting: Family Medicine

## 2022-04-21 MED ORDER — AMPHETAMINE-DEXTROAMPHETAMINE 30 MG PO TABS *I*
30.0000 mg | ORAL_TABLET | Freq: Every morning | ORAL | 0 refills | Status: DC
Start: 2022-04-21 — End: 2022-06-08

## 2022-04-21 NOTE — Telephone Encounter (Signed)
Adderall prescsribed. 30 mg daily for 30 days.

## 2022-04-27 ENCOUNTER — Ambulatory Visit: Payer: BLUE CROSS/BLUE SHIELD | Admitting: Orthopaedic Surgery

## 2022-05-06 ENCOUNTER — Telehealth: Payer: Self-pay | Admitting: *Deleted

## 2022-05-06 NOTE — Patient Outreach (Signed)
  Care Coordination   05/06/2022 Name: Jill Mcintyre MRN: [id number] DOB: August 13, 1960   Care Coordination Outreach Attempts:  An unsuccessful telephone outreach was attempted today to offer the patient information about available care coordination services as a benefit of their health plan.   Follow Up Plan:  Additional outreach attempts will be made to offer the patient care coordination information and services.   Encounter Outcome:  No Answer  Care Coordination Interventions Activated:  No   Care Coordination Interventions:  No, not indicated    Raina Mina, RN Care Management Coordinator [place] Office [phone]

## 2022-05-13 ENCOUNTER — Encounter: Payer: Self-pay | Admitting: Orthopaedic Surgery

## 2022-05-13 ENCOUNTER — Ambulatory Visit: Payer: BLUE CROSS/BLUE SHIELD | Admitting: Orthopaedic Surgery

## 2022-05-13 ENCOUNTER — Other Ambulatory Visit: Payer: Self-pay

## 2022-05-13 VITALS — BP 113/56 | HR 75 | Ht 68.5 in | Wt 151.0 lb

## 2022-05-13 DIAGNOSIS — Z981 Arthrodesis status: Secondary | ICD-10-CM

## 2022-05-13 NOTE — Progress Notes (Signed)
HISTORY OF PRESENT ILLNESS    Gail Jensen presents to clinic today for second postop visit. They underwent revision of right L4 cortical screw on 04/01/2022, L4-5 instrumented fusion on 03/16/2022.  She is making excellent progress since revision of her screw.  She denies any numbness or tingling throughout the right lower extremity.  She has tightness in her lower back and still feels like she gets fatigued after too much activity.  Her right ankle strength is also improving.  She denies any significant radiating leg pain.      REVIEW OF SYSTEMS:   Noncontributory for head, eyes, ear, nose, and throat, cardiac, pulmonary, gastrointestinal, genitourinary, integumentary, psychiatric, endocrine, neurologic and musculoskeletal systems.       PHYSICAL EXAMINATION:   Vitals:    05/13/22 1425   BP: 113/56   Pulse: 75   Weight: 68.5 kg (151 lb)   Height: 1.74 m (5' 8.5")       Patient is  well-appearing and in no apparent distress.  They ambulate with a normal gait, no assist device.  Incision is healing well with no surrounding erythema or drainage.  Overall posture and alignment are within normal limits.    No gross motor or sensory deficits throughout the left lower extremity.  Trace weakness in the right tibialis anterior compared to the left.  Extremities are warm and well-perfused without evidence of peripheral edema.       IMAGING:    None    ASSESSMENT AND PLAN:    All findings were reviewed with Gail Jensen in the office today.  She is making excellent progress following her surgery.  Will start her on a course of outpatient physical therapy.  I cautioned her to listen to her body and not overdo it early on.  We discussed doing some core strengthening at home while she is waiting to get into physical therapy.  I like to see her back in the office in about 2 to 3 months for routine follow-up and x-ray check.  She knows to call the office in the meantime with any questions or concerns.    Follow up:  Approximately 2 to 3  months    X-rays/studies at follow up:  Lumbar spine AP lateral x-rays    Evelena Asa, MD  Orthopaedic Spine Surgeon  [facility]    Voice recognition software was used to create this note. Please excuse any typos.    Answers submitted by the patient for this visit:  Right leg (Submitted on 04/11/2022)  Chief Complaint: Right leg pain  Date of onset: : 03/17/2022  Was this the result of an injury?: No  What is your pain level?: 6/10  Please describe the quality of your pain: : radiating  What diagnostic workup have you had for this condition?: CT scan  What treatments have you tried for this condition?: acetaminophen, gabapentin, heat, oral steroids  Is this a work related condition? : No  Current work status: : no work    Answers submitted by the patient for this visit:  Back (Submitted on 05/08/2022)  Chief Complaint: Back Pain  What is your pain level?: 4/10  Please describe the quality of your pain: : aching, discomfort, stiffness  Current work status: : no work

## 2022-05-16 ENCOUNTER — Other Ambulatory Visit: Payer: Self-pay | Admitting: Family Medicine

## 2022-05-19 ENCOUNTER — Other Ambulatory Visit: Payer: Self-pay | Admitting: Family Medicine

## 2022-05-19 NOTE — Telephone Encounter (Signed)
Last office visit:   03/24/2022  Patients upcoming appointments:  Future Appointments   Date Time Provider Department Center   07/13/2022  1:30 PM Rogerson, Ashley, MD [facility] None   09/23/2022  9:40 AM Rahmatullah, Syed Ali, MD [facility] None     Recent Lab results:  GENERAL CHEMISTRY   Recent Labs     04/01/22  2208 04/01/22  1425 03/16/22  2359   NA 139 141 140   K 4.4 4.2 4.1   CL 102 103 103   CO2 27 25 27   GAP 10 13 10   UN 13 14 15   CREAT 0.85 0.71 0.75   GLU 99 169* 177*   CA 8.8 8.9 8.7      LIPID PROFILE   Recent Labs     03/24/22  1034   CHOL 182   TRIG 141   HDL 47   LDLC 107      LIVER PROFILE   Recent Labs     03/11/22  1335 03/05/22  1108   ALT 27 21   AST 27 22   ALK 58 55   TB 0.4 0.3      DIABETES THYROID   Recent Labs     03/24/22  1034   HA1C 5.7*    No value within the past 365 days      Pending/Orders Labs:  Lab Frequency Next Occurrence   Mammography screening BILATERAL Once 03/11/2022

## 2022-06-02 ENCOUNTER — Encounter: Payer: Self-pay | Admitting: Gastroenterology

## 2022-06-08 ENCOUNTER — Other Ambulatory Visit: Payer: Self-pay | Admitting: Family Medicine

## 2022-06-08 NOTE — Telephone Encounter (Signed)
Last office visit:   03/24/2022  Patients upcoming appointments:  Future Appointments   Date Time Provider Department Center   07/13/2022  1:30 PM Evelena Asa, MD [facility] None   09/23/2022  9:40 AM Kriste Basque, Gaye Pollack, MD [facility] None     Recent Lab results:  GENERAL CHEMISTRY   Recent Labs     04/01/22  2208 04/01/22  1425 03/16/22  2359   NA 139 141 140   K 4.4 4.2 4.1   CL 102 103 103   CO2 27 25 27    GAP 10 13 10    UN 13 14 15    CREAT 0.85 0.71 0.75   GLU 99 169* 177*   CA 8.8 8.9 8.7      LIPID PROFILE   Recent Labs     03/24/22  1034   CHOL 182   TRIG 141   HDL 47   LDLC 107      LIVER PROFILE   Recent Labs     03/11/22  1335 03/05/22  1108   ALT 27 21   AST 27 22   ALK 58 55   TB 0.4 0.3      DIABETES THYROID   Recent Labs     03/24/22  1034   HA1C 5.7*    No value within the past 365 days      Pending/Orders Labs:  Lab Frequency Next Occurrence   Mammography screening BILATERAL Once 03/11/2022

## 2022-06-28 ENCOUNTER — Encounter: Payer: Self-pay | Admitting: Orthopaedic Surgery

## 2022-06-29 NOTE — Telephone Encounter (Signed)
Hello,    I responded back to the patient.  I told her I would send her note to Dr. Charlton Amor.    Thanks,    Sue Lush

## 2022-07-01 ENCOUNTER — Other Ambulatory Visit: Payer: Self-pay | Admitting: Obstetrics and Gynecology

## 2022-07-01 NOTE — Telephone Encounter (Signed)
Received a refill request from the patient's pharmacy for estradiol 0.0375 mg twice weekly patch. Last seen 03/11/22 for NPV. Was to follow up in 6 months for a well woman exam. Pended if ok, thank you

## 2022-07-07 ENCOUNTER — Encounter: Payer: Self-pay | Admitting: Gastroenterology

## 2022-07-10 ENCOUNTER — Encounter: Payer: Self-pay | Admitting: Gastroenterology

## 2022-07-13 ENCOUNTER — Encounter: Payer: Self-pay | Admitting: Orthopaedic Surgery

## 2022-07-13 ENCOUNTER — Other Ambulatory Visit: Payer: Self-pay

## 2022-07-13 ENCOUNTER — Telehealth: Payer: Self-pay | Admitting: Obstetrics and Gynecology

## 2022-07-13 ENCOUNTER — Ambulatory Visit: Payer: BLUE CROSS/BLUE SHIELD | Admitting: Orthopaedic Surgery

## 2022-07-13 ENCOUNTER — Encounter: Payer: Self-pay | Admitting: Obstetrics and Gynecology

## 2022-07-13 ENCOUNTER — Ambulatory Visit: Payer: BLUE CROSS/BLUE SHIELD | Admitting: Obstetrics and Gynecology

## 2022-07-13 VITALS — BP 130/74 | HR 77 | Ht 68.5 in | Wt 152.0 lb

## 2022-07-13 DIAGNOSIS — Z780 Asymptomatic menopausal state: Secondary | ICD-10-CM

## 2022-07-13 DIAGNOSIS — M542 Cervicalgia: Secondary | ICD-10-CM

## 2022-07-13 DIAGNOSIS — Z79890 Hormone replacement therapy (postmenopausal): Secondary | ICD-10-CM

## 2022-07-13 DIAGNOSIS — Z981 Arthrodesis status: Secondary | ICD-10-CM

## 2022-07-13 DIAGNOSIS — R232 Flushing: Secondary | ICD-10-CM

## 2022-07-13 MED ORDER — ESTRADIOL-LEVONORGESTREL 0.045-0.015 MG/DAY TD PTWK *A*
1.0000 | MEDICATED_PATCH | TRANSDERMAL | 2 refills | Status: DC
Start: 2022-07-13 — End: 2022-10-06

## 2022-07-13 MED ORDER — ESTRADIOL-LEVONORGESTREL 0.045-0.015 MG/DAY TD PTWK *A*
1.0000 | MEDICATED_PATCH | TRANSDERMAL | 2 refills | Status: DC
Start: 2022-07-13 — End: 2022-07-13

## 2022-07-13 NOTE — Progress Notes (Signed)
Orthopaedic Surgery Follow Up Note    Patient: Gail Jensen  MRN: [id number]  Date of visit: 07/13/2022     Reason for visit:  Follow-up for   1. Myofascial neck pain    S/p revision of right L4 cortical screw on 04/01/2022, L4-5 instrumented fusion on 03/16/2022     Interval history:   Danesa states she has been doing well. She has minimal pain. She has been attending PT and diligently performing her home exercises. She feels her right leg strength is improving. She is pleased with her progress. No new issues with respect tot the lumbar spine.     She does report she has some neck pain. This does not radiate to the BUE and is not associated with numbness/tingling/weakness, clumsiness, gait instability, loss of balance. She would like to attend PT for this.     Patient's medications, allergies, past medical, surgical, social and family histories were reviewed and updated as appropriate.    Review of systems:  Pertinent positives/negatives: see interval history above  Otherwise negative for 12 system review    Physical Exam:  Vitals:    07/13/22 1305   BP: 130/74   Pulse: 77   Weight: 68.9 kg (152 lb)   Height: 1.74 m (5' 8.5")       General: Alert, no acute distress    Neurological Examination    Motor Exam      RIGHT LEFT   C5 Elbow flexors 5 5   C6 Wrist extensor 5 5   C7 Elbow extensor 5 5   C8 Finger flexors 5 5   T1 Finger abductor 5 5   L2 Hip flexors 5 5   L3 Knee extensors 5 5   L4 Ankle dorsiflexors 5 5   L5 Extensor hallucis longus 5 5   S1 Ankle plantar flexors 5 5      Sensory Exam    Light touch     RIGHT LEFT   C2 2 2   C3 2 2   C4 2 2   C5 2 2   C6 2 2   C7 2 2   C8 2 2   T1 2 2   L1 2 2   L2 2 2   L3 2 2   L4 2 2   L5 2 2   S1 2 2      Special    RIGHT LEFT   Hoffman's NEG NEG        Biceps       2+        2+   Brachioradialis       2+        2+   Triceps       1+        1+   Patella 2+ 2+   Achilles 1+ 1+           Babinski Down Down   Clonus (>5 beats) None None             Imaging:   None new      Assessment/Plan: 62 y.o. female  1. Myofascial neck pain    2. S/p revision of right L4 cortical screw on 04/01/2022, L4-5 instrumented fusion on 03/16/2022     She is doing very well. We have provided her with a new PT script for the cervical spine, we suspect this is myofascial in nature. She will continue home PT for the lumbar spine and further  strengthening of the RLE. She will return to clinic in three months with repeat films of the lumbar spine at that time.     Weight Bearing: WBAT BLE  DVT PPx: Ambulation  Follow-up: 3 months  X-rays at next Visit: Lumbar spine     Quita Skye. Remonia Richter., MD  Orthopaedic Surgery Resident   07/13/2022 1:30 PM    Answers submitted by the patient for this visit:  Back (Submitted on 07/06/2022)  Chief Complaint: Back Pain  What is your goal for today's visit?: Post op  Handedness: Right Handed

## 2022-07-13 NOTE — Telephone Encounter (Signed)
Patient seen today for telemedicine med check. Script sent for Climara Pro patch. Please see patient message, she has found it for a cheaper cost with [employer]. Script pended with pharmacy adjustment, thank you

## 2022-07-13 NOTE — Progress Notes (Signed)
Telephone Visit     This is an established patient visit.    Mode of Communication with Patient for This Visit    Mode of Communication with Patient for This Visit: Audio Only  Audio Only: Reason: Patient prefers Audio Only  Patient Location: Home  Provider Location: Clinic         The service provided today can be effectively delivered without a visual or in-person component and includes all clinically appropriate documentation requirements as if provided in-person. Audio-visual will continue to be an option for this patient as well as in-person visits.      Reason for visit: Follow-up    HPI:  Gail Jensen notes she has had some increase in hot flash, brain fog, fatigue, and body aches. She states she has difficulty remembering to use her patch biweekly and wonders if there is an alternative. She has had lumbar fusion and a revision of this in the Fall of this year and is gradually recovering with physical therapy She has had no side effects with the use of HRT, no break through bleeding, breast tenderness, leg pain or shortness of breath    Patient's problem list, allergies, and medications were reviewed and updated as appropriate. Please see the EHR for full details.    Physical Exam:  Review last visit note    Assessment Plan:  Post menopause  Hormone replacement  Hot flashes, brain fog, general aches    The plan was discussed with the patient and the patient/patient rep demonstrated understanding to the provider's satisfaction.    Consent was previously obtained from the patient to complete this telephone consult; including the potential for financial liability.    11-20 minutes were spent on the phone with the patient, in chart review and in care planning       Seleta Rhymes, NP

## 2022-07-13 NOTE — Telephone Encounter (Signed)
Called and spoke with the patient to get more information from her insurance card:    Rx bin: [phone]  RX pcn: IS  Rx grp: Glyn Ade

## 2022-07-13 NOTE — Telephone Encounter (Signed)
Case: [id number] is pending for Climara Pro  Sub on Cover My Meds

## 2022-07-13 NOTE — Telephone Encounter (Signed)
Received a fax that patient's script for Climara Pro patches requires prior authorization.     Medication name: estradiol-levonorgestrel (Climara Pro)  Dispense as written or generic ok: generic ok  Medication form: patch  Application: transdermal  Dosage: 1 patch  Frequency: once weekly  Quantity ordered: 4 patches  How many days or months supply: 1 month supply  ICD-10 code: R23.2, G47.9, N95.1    Patient has tried the estradiol 0.0375 mg patch. This has not been effective as she has had an increase in hot flash, brain fog, fatigue, and body aches.     Thank you

## 2022-07-14 ENCOUNTER — Encounter: Payer: Self-pay | Admitting: Pediatrics

## 2022-07-14 NOTE — Telephone Encounter (Signed)
Case: [id number] is APPROVED for Climara Pro  1/22/243 - 07/13/23  VM left at pharmacy

## 2022-07-27 ENCOUNTER — Other Ambulatory Visit: Payer: Self-pay | Admitting: Family Medicine

## 2022-07-27 MED ORDER — AMPHETAMINE-DEXTROAMPHETAMINE 30 MG PO TABS *I*
1.0000 | ORAL_TABLET | Freq: Every morning | ORAL | 0 refills | Status: DC
Start: 2022-07-27 — End: 2022-10-06

## 2022-07-27 NOTE — Telephone Encounter (Signed)
Last appointment: 03/24/2022  Next appointment: 09/23/2022

## 2022-07-27 NOTE — Telephone Encounter (Signed)
MyChart message sent to the patient relaying message from Verlin Grills, NP below.     Maud Deed, NP   to Me   MR    07/27/22  3:50 PM  Yes, she should be using progesterone, for sure. We had just talked and I don't think she had mentioned she wanted an increase, just a once weekly patch. What symptoms does she want to work on? Thank You

## 2022-07-27 NOTE — Telephone Encounter (Signed)
Please see patient message. Last seen 07/13/22. Was prescribed Combipatch, which was improved by her insurance, but is now out of stock at her pharmacy. She never received this and has continued the estradiol patch in the meantime. She wonders if the dosage of the estradiol patch and progesterone need to be increased. She does note she had stopped the progesterone and wonders if she should start again today. Thank you

## 2022-07-30 ENCOUNTER — Other Ambulatory Visit: Payer: Self-pay | Admitting: Family Medicine

## 2022-07-30 MED ORDER — TRAZODONE HCL 50 MG PO TABS *I*
50.0000 mg | ORAL_TABLET | Freq: Every evening | ORAL | 1 refills | Status: DC
Start: 2022-07-30 — End: 2022-08-10

## 2022-07-30 NOTE — Telephone Encounter (Signed)
Last appointment: 03/24/2022  Next appointment: 09/23/2022

## 2022-08-03 ENCOUNTER — Encounter: Payer: Self-pay | Admitting: Gastroenterology

## 2022-08-07 ENCOUNTER — Encounter: Payer: Self-pay | Admitting: Family Medicine

## 2022-08-10 MED ORDER — TRAZODONE HCL 50 MG PO TABS *I*
150.0000 mg | ORAL_TABLET | Freq: Every evening | ORAL | 1 refills | Status: DC
Start: 2022-08-10 — End: 2022-09-23

## 2022-08-10 NOTE — Telephone Encounter (Signed)
Last appointment: 03/24/2022  Next appointment: 09/23/2022

## 2022-08-13 ENCOUNTER — Other Ambulatory Visit: Payer: Self-pay | Admitting: Family Medicine

## 2022-08-13 MED ORDER — TRAZODONE HCL 150 MG PO TABS *A*
150.0000 mg | ORAL_TABLET | Freq: Every evening | ORAL | 1 refills | Status: DC
Start: 2022-08-13 — End: 2023-02-03

## 2022-08-13 NOTE — Telephone Encounter (Signed)
Insurance will not cover trazodone 50 mg take 3 tablets nightly.  It needs to be resent for Trazodone 150 mg take 1 tab by mouth nightly.

## 2022-08-20 ENCOUNTER — Encounter: Payer: Self-pay | Admitting: Family Medicine

## 2022-08-21 ENCOUNTER — Telehealth: Payer: Self-pay | Admitting: Family Medicine

## 2022-08-21 ENCOUNTER — Other Ambulatory Visit: Payer: Self-pay | Admitting: Family Medicine

## 2022-08-21 MED ORDER — BIMATOPROST 0.03 % EX SOLN *A*
1.0000 [drp] | CUTANEOUS | 1 refills | Status: DC
Start: 2022-08-21 — End: 2022-08-21

## 2022-08-21 MED ORDER — BIMATOPROST 0.03 % EX SOLN *A*
1.0000 [drp] | CUTANEOUS | 1 refills | Status: DC
Start: 2022-08-21 — End: 2023-06-11

## 2022-08-21 NOTE — Telephone Encounter (Signed)
Spoke to patient. Patient stated that previous provider directed her to use as an eyelash serum on both eyes, once daily at night.

## 2022-08-21 NOTE — Telephone Encounter (Signed)
New patient to practice, refill should ideally come from her ophthalmologist. Please confirm with patient how she is meant to use these eye drops from her initial prescriber, 1 eye or both eyes etc.. so we can send the correct script to pharmacy

## 2022-08-21 NOTE — Telephone Encounter (Signed)
Informed patient via MyChart

## 2022-08-21 NOTE — Telephone Encounter (Signed)
Pt requesting Rx resent to [facility] in [place].           Last office visit:   03/24/2022  Patients upcoming appointments:  Future Appointments   Date Time Provider [place]   09/23/2022  9:40 AM Aldean Ast, Lillia Dallas, MD [facility] None   10/12/2022  1:00 PM Wyline Copas, MD [facility] None     Recent Lab results:  GENERAL CHEMISTRY   Recent Labs     04/01/22  2208 04/01/22  1425 03/16/22  2359   NA 139 141 140   K 4.4 4.2 4.1   CL 102 103 103   CO2 '27 25 27   '$ GAP '10 13 10   '$ UN '13 14 15   '$ CREAT 0.85 0.71 0.75   GLU 99 169* 177*   CA 8.8 8.9 8.7      LIPID PROFILE   Recent Labs     03/24/22  1034   CHOL 182   TRIG 141   HDL 47   LDLC 107      LIVER PROFILE   Recent Labs     03/11/22  1335 03/05/22  1108   ALT 27 21   AST 27 22   ALK 58 55   TB 0.4 0.3      DIABETES THYROID   Recent Labs     03/24/22  1034   HA1C 5.7*    No value within the past 365 days      Pending/Orders Labs:  Lab Frequency Next Occurrence   Mammography screening BILATERAL Once 03/11/2022   Spine lumbar standard AP and Lateral views Once 10/12/2022

## 2022-08-21 NOTE — Telephone Encounter (Signed)
Can you please send this Generic Rx to [facility] this week?      I have attached the [employer] [facility] that is available

## 2022-08-21 NOTE — Telephone Encounter (Signed)
New rx with correct sig sent to pharmacy.

## 2022-08-21 NOTE — Telephone Encounter (Signed)
Last office visit:   03/24/2022  Patients upcoming appointments:  Future Appointments   Date Time Provider [place]   09/23/2022  9:40 AM Rahmatullah, Lillia Dallas, MD [facility] None   10/12/2022  1:00 PM Wyline Copas, MD [facility] None     Recent Lab results:  GENERAL CHEMISTRY   Recent Labs     04/01/22  2208 04/01/22  1425 03/16/22  2359   NA 139 141 140   K 4.4 4.2 4.1   CL 102 103 103   CO2 '27 25 27   '$ GAP '10 13 10   '$ UN '13 14 15   '$ CREAT 0.85 0.71 0.75   GLU 99 169* 177*   CA 8.8 8.9 8.7      LIPID PROFILE   Recent Labs     03/24/22  1034   CHOL 182   TRIG 141   HDL 47   LDLC 107      LIVER PROFILE   Recent Labs     03/11/22  1335 03/05/22  1108   ALT 27 21   AST 27 22   ALK 58 55   TB 0.4 0.3      DIABETES THYROID   Recent Labs     03/24/22  1034   HA1C 5.7*    No value within the past 365 days      Pending/Orders Labs:  Lab Frequency Next Occurrence   Mammography screening BILATERAL Once 03/11/2022   Spine lumbar standard AP and Lateral views Once 10/12/2022

## 2022-08-21 NOTE — Telephone Encounter (Signed)
Joe, pharmacist from [facility], is needing clarification on the following medicaiton.    bimatoprost (LATISSE) 0.03 % ophthalmic solution 3 mL 1 08/21/2022 --    Sig - Route: Apply 1 drop to eye once a week  friday - Ophthalmic      He stated this is to be applied to the base of eyelashes. He is not sure if provider has a special plan.    Please advise

## 2022-09-23 ENCOUNTER — Ambulatory Visit: Payer: BLUE CROSS/BLUE SHIELD | Admitting: Family Medicine

## 2022-09-23 ENCOUNTER — Other Ambulatory Visit: Payer: Self-pay

## 2022-09-23 ENCOUNTER — Encounter: Payer: Self-pay | Admitting: Family Medicine

## 2022-09-23 ENCOUNTER — Other Ambulatory Visit
Admission: RE | Admit: 2022-09-23 | Discharge: 2022-09-23 | Disposition: A | Discharge status: Home / Self Care | Payer: BLUE CROSS/BLUE SHIELD | Source: Ambulatory Visit | Attending: Family Medicine | Admitting: Family Medicine

## 2022-09-23 VITALS — BP 126/78 | HR 70 | Temp 78.2°F | Wt 153.0 lb

## 2022-09-23 DIAGNOSIS — R7303 Prediabetes: Secondary | ICD-10-CM | POA: Insufficient documentation

## 2022-09-23 DIAGNOSIS — K219 Gastro-esophageal reflux disease without esophagitis: Secondary | ICD-10-CM

## 2022-09-23 DIAGNOSIS — Z780 Asymptomatic menopausal state: Secondary | ICD-10-CM

## 2022-09-23 DIAGNOSIS — G4733 Obstructive sleep apnea (adult) (pediatric): Secondary | ICD-10-CM

## 2022-09-23 LAB — HEMOGLOBIN A1C: Hemoglobin A1C: 5.8 % — ABNORMAL HIGH

## 2022-09-23 NOTE — Progress Notes (Signed)
Subjective:   Gail Jensen is a 62 y.o. female with No past medical history on file.     Presented to the office today for:  Chief Complaint   Patient presents with    Follow-up    Apnea       HPI    Gail Jensen is here for follow-up of prediabetes, menopausal symptoms, GERD and OSA.    Reviewed her lab work from previous visit including A1c lipid panel, lipid panel is pristine, A1c is 5.7%, she is currently working on just her diet and exercise.    Regarding menopausal symptoms, medications reviewed, well-controlled with estradiol as well as follow-up on atrium 200 mg daily for the first 15 days of the month.  She follows with GYN and they will take care of this condition.    Her GERD is well-controlled on the PPI and she does have OSA, uses CPAP machine, still snores a lot and will try to get into see sleep medicine    Health maintenance was reviewed, nothing is pending, colon cancer screening is due next year, mammogram is up-to-date.  Pap smears are up-to-date.        Review of Systems   Constitutional:  Negative for activity change, fatigue, fever and unexpected weight change.   Eyes:  Negative for visual disturbance.   Respiratory:  Negative for cough, chest tightness and shortness of breath.    Cardiovascular:  Negative for chest pain and palpitations.   Gastrointestinal:  Negative for abdominal pain, blood in stool, constipation, diarrhea and nausea.   Endocrine: Negative for cold intolerance and heat intolerance.   Genitourinary:  Negative for hematuria and vaginal bleeding.   Musculoskeletal:  Negative for arthralgias.   Skin:  Negative for rash.   Neurological:  Negative for headaches.   Psychiatric/Behavioral:  Negative for sleep disturbance. The patient is not nervous/anxious.                  The patient has a   Family History   Problem Relation Age of Onset    Bladder Cancer Mother     Liver cancer Mother     Anesthesia problems Neg Hx        Objective:   BP 126/78 (BP Location: Left arm, Patient  Position: Sitting, Cuff Size: adult)   Pulse 70   Temp (!) 25.7 C (78.2 F)   Wt 69.4 kg (153 lb)   SpO2 96%   BMI 22.93 kg/m    BP Readings from Last 3 Encounters:   09/23/22 126/78   07/13/22 130/74   05/13/22 113/56       Physical Exam    Lab Results   Component Value Date    WBC 9.2 04/01/2022    HGB 10.8 (L) 04/01/2022    HCT 34 04/01/2022    PLT 288 04/01/2022    CHOL 182 03/24/2022    TRIG 141 03/24/2022    HDL 47 03/24/2022    ALT 27 03/11/2022    AST 27 03/11/2022    NA 139 04/01/2022    K 4.4 04/01/2022    CL 102 04/01/2022    CO2 27 04/01/2022     No components found for: "CALCIUM", "PHOS"  No components found for: "LDLCALC", "LDLCHOLESTEROL", "LDLDIRECT"    Assessment and Plan:     1. Prediabetes  - Continue diet and exercise, recheck A1c  - Hemoglobin A1c; Future    2. Menopause present  -Continue estradiol and Prometrium 20 mg daily  3. Gastroesophageal reflux disease without esophagitis  -Continue PPI, condition stable    4. OSA (obstructive sleep apnea)  -Continue CPAP machine            Requested Prescriptions      No prescriptions requested or ordered in this encounter       Medications Discontinued During This Encounter   Medication Reason    oxyCODONE (ROXICODONE) 10 mg immediate release tablet Clinically indicated    predniSONE (DELTASONE) 20 mg tablet Clinically indicated    traZODone (DESYREL) 50 mg tablet Med List Cleanup (does not show as 'Stop Taking' on AVS)       [name] received counseling on the following healthy behaviors: nutrition, exercise and medication adherence    Discussed use,benefit, and side effects of prescribed medications.  Barriers to medication compliance addressed.      All patient questions answered.  Pt voiced understanding.     Follow up in about 6 months (around 03/25/2023).        Disclaimer: Some orall of this note was transcribed using voice-recognition software.This may cause typographical errors occasionally. Although all effort is made to fix these  errors, please do not hesitate to contact our office if there isany concern with the understanding of this note.

## 2022-09-28 ENCOUNTER — Encounter: Payer: Self-pay | Admitting: Family Medicine

## 2022-10-06 ENCOUNTER — Other Ambulatory Visit: Payer: Self-pay | Admitting: Family Medicine

## 2022-10-06 ENCOUNTER — Encounter: Payer: Self-pay | Admitting: Obstetrics and Gynecology

## 2022-10-06 MED ORDER — AMPHETAMINE-DEXTROAMPHETAMINE 30 MG PO TABS *I*
1.0000 | ORAL_TABLET | Freq: Every morning | ORAL | 0 refills | Status: DC
Start: 2022-10-06 — End: 2022-11-24

## 2022-10-06 MED ORDER — ESTRADIOL-LEVONORGESTREL 0.045-0.015 MG/DAY TD PTWK *A*
1.0000 | MEDICATED_PATCH | TRANSDERMAL | 0 refills | Status: DC
Start: 2022-10-06 — End: 2022-10-06

## 2022-10-06 MED ORDER — ESTRADIOL 0.0375 MG/24HR TD PTTW *A*
1.0000 | MEDICATED_PATCH | TRANSDERMAL | 0 refills | Status: DC
Start: 2022-10-08 — End: 2022-11-12

## 2022-10-06 NOTE — Telephone Encounter (Addendum)
Called the patient regarding her patches.  She requested estrogen patches and the wrong patch was selected.  She can't afford the Climara pro patch and is requesting the Vivelle-dot patches.  90 day supply pended as per request.  Irving Burton, RN

## 2022-10-06 NOTE — Telephone Encounter (Signed)
Gail Jensen patient- Rx request- last telemedicine 07/13/22.  Sending MyChart message to follow up on HRT for June.  Irving Burton, RN

## 2022-10-06 NOTE — Telephone Encounter (Signed)
Last office visit:   09/23/2022  Patients upcoming appointments:  Future Appointments   Date Time Provider Department Center   10/19/2022  1:00 PM Evelena Asa, MD [facility] None   03/30/2023  9:40 AM Kriste Basque, Gaye Pollack, MD [facility] None     Recent Lab results:  GENERAL CHEMISTRY   Recent Labs     04/01/22  2208 04/01/22  1425 03/16/22  2359   NA 139 141 140   K 4.4 4.2 4.1   CL 102 103 103   CO2 27 25 27    GAP 10 13 10    UN 13 14 15    CREAT 0.85 0.71 0.75   GLU 99 169* 177*   CA 8.8 8.9 8.7      LIPID PROFILE   Recent Labs     03/24/22  1034   CHOL 182   TRIG 141   HDL 47   LDLC 107      LIVER PROFILE   Recent Labs     03/11/22  1335 03/05/22  1108   ALT 27 21   AST 27 22   ALK 58 55   TB 0.4 0.3      DIABETES THYROID   Recent Labs     09/23/22  1027 03/24/22  1034   HA1C 5.8* 5.7*    No value within the past 365 days      Pending/Orders Labs:  Lab Frequency Next Occurrence   Mammography screening BILATERAL Once 03/11/2022   Spine lumbar standard AP and Lateral views Once 10/12/2022

## 2022-10-06 NOTE — Addendum Note (Signed)
Addended by: Delight Ovens A on: 10/06/2022 03:28 PM     Modules accepted: Orders

## 2022-10-08 ENCOUNTER — Encounter: Payer: Self-pay | Admitting: Gastroenterology

## 2022-10-09 ENCOUNTER — Ambulatory Visit: Payer: BLUE CROSS/BLUE SHIELD | Admitting: Orthopaedic Surgery

## 2022-10-12 ENCOUNTER — Ambulatory Visit: Payer: BLUE CROSS/BLUE SHIELD | Admitting: Orthopaedic Surgery

## 2022-10-19 ENCOUNTER — Ambulatory Visit: Payer: BLUE CROSS/BLUE SHIELD | Attending: Orthopaedic Surgery | Admitting: Orthopaedic Surgery

## 2022-10-19 ENCOUNTER — Ambulatory Visit
Admission: RE | Admit: 2022-10-19 | Discharge: 2022-10-19 | Disposition: A | Discharge status: Home / Self Care | Payer: BLUE CROSS/BLUE SHIELD | Source: Ambulatory Visit

## 2022-10-19 ENCOUNTER — Other Ambulatory Visit: Payer: Self-pay

## 2022-10-19 VITALS — BP 148/73 | HR 72 | Ht 68.5 in | Wt 153.0 lb

## 2022-10-19 DIAGNOSIS — M48061 Spinal stenosis, lumbar region without neurogenic claudication: Secondary | ICD-10-CM

## 2022-10-19 DIAGNOSIS — M4316 Spondylolisthesis, lumbar region: Secondary | ICD-10-CM | POA: Insufficient documentation

## 2022-10-19 DIAGNOSIS — Z981 Arthrodesis status: Secondary | ICD-10-CM

## 2022-10-19 MED ORDER — TIZANIDINE HCL 4 MG PO CAPS *A*
4.0000 mg | ORAL_CAPSULE | Freq: Two times a day (BID) | ORAL | 1 refills | Status: DC | PRN
Start: 2022-10-19 — End: 2023-03-18

## 2022-10-19 NOTE — Progress Notes (Signed)
HISTORY OF PRESENT ILLNESS    Gail Jensen presents to clinic today for routine postop visit. She also underwent revision of right L4 cortical screw on 04/01/2022, L4-5 instrumented fusion on 03/16/2022.  She is doing well overall but about 2 weeks ago she went on trip to [place] for a concert.  She had really bad back pain when she got back from that trip.  It tended to affect the right more than the left side.  It has subsided somewhat over the last week.  She has been using heat and 800 mg ibuprofen.  She denies any radicular symptoms.  Her right ankle is still slightly weaker than the left.  She is doing home physical therapy exercises.  No issues with her incision.    REVIEW OF SYSTEMS:   Noncontributory for head, eyes, ear, nose, and throat, cardiac, pulmonary, gastrointestinal, genitourinary, integumentary, psychiatric, endocrine, neurologic and musculoskeletal systems.       PHYSICAL EXAMINATION:   Vitals:    10/19/22 1303   BP: 148/73   Pulse: 72   Weight: 69.4 kg (153 lb)   Height: 1.74 m (5' 8.5")       Patient is  well-appearing and in no apparent distress.  They ambulate with a normal gait, no assist device.  Incision is healing well with no surrounding erythema or drainage.  Overall posture and alignment are within normal limits.    No gross motor or sensory deficits throughout the left lower extremity.  Trace weakness in the right tibialis anterior compared to the left, stable from prior exam.  Extremities are warm and well-perfused without evidence of peripheral edema.       IMAGING:    Upright lumbar spine x-rays obtained in the office today were reviewed.  No evidence of hardware failure or change in alignment compared to previous x-rays in October.    ASSESSMENT AND PLAN:    All findings were reviewed with Gail Jensen in the office today.  We reviewed her x-rays together and I reassured her that everything looks good.  I think she had a flareup of some muscular low back pain following a long car trip  and standing on her feet for the concert.  I will send in a prescription for Zanaflex to use as needed if she encounters these flareups of low back pain.  I encouraged her to stay active over the summer and let me know if she has any worsening back pain or recurrence of the leg symptoms.  Otherwise I will see her back in the office for 1 year follow-up in October.  All questions were answered and she agrees with the plan.    Follow up:  6 months    X-rays/studies at follow up:  Lumbar spine AP lateral x-rays    Evelena Asa, MD  Orthopaedic Spine Surgeon  [facility]    Voice recognition software was used to create this note. Please excuse any typos.    Answers submitted by the patient for this visit:  Back (Submitted on 10/15/2022)  Chief Complaint: Back Pain  What is your goal for today's visit?: XRay, Lumbar left and right. Low back pain  Handedness: Right Handed  Date of onset: : 06/22/2021  Was this the result of an injury?: No  What is your pain level?: 6/10  Please describe the quality of your pain: : aching, sharp, sore  What diagnostic workup have you had for this condition?: MRI scan, X-ray  What treatments have you tried for this condition?: acetaminophen,  activity modification, bracing, heat, home exercise program, ice, physical therapy, surgery  Progression since onset: : gradually improving  Is this a work related condition? : No  Current work status: : no work

## 2022-11-02 ENCOUNTER — Other Ambulatory Visit: Payer: Self-pay | Admitting: Family Medicine

## 2022-11-02 NOTE — Telephone Encounter (Signed)
Last office visit:   09/23/2022  Patients upcoming appointments:  Future Appointments   Date Time Provider Department Center   11/12/2022 10:00 AM Reisig, Emelda Fear, NP [facility]   03/30/2023  9:40 AM Kriste Basque, Gaye Pollack, MD [facility] None   04/19/2023  3:30 PM Evelena Asa, MD [facility] None     Recent Lab results:  GENERAL CHEMISTRY   Recent Labs     04/01/22  2208 04/01/22  1425 03/16/22  2359   NA 139 141 140   K 4.4 4.2 4.1   CL 102 103 103   CO2 27 25 27    GAP 10 13 10    UN 13 14 15    CREAT 0.85 0.71 0.75   GLU 99 169* 177*   CA 8.8 8.9 8.7      LIPID PROFILE   Recent Labs     03/24/22  1034   CHOL 182   TRIG 141   HDL 47   LDLC 107      LIVER PROFILE   Recent Labs     03/11/22  1335 03/05/22  1108   ALT 27 21   AST 27 22   ALK 58 55   TB 0.4 0.3      DIABETES THYROID   Recent Labs     09/23/22  1027 03/24/22  1034   HA1C 5.8* 5.7*    No value within the past 365 days      Pending/Orders Labs:  Lab Frequency Next Occurrence   Mammography screening BILATERAL Once 03/11/2022

## 2022-11-12 ENCOUNTER — Encounter: Payer: Self-pay | Admitting: Obstetrics and Gynecology

## 2022-11-12 ENCOUNTER — Other Ambulatory Visit
Admission: RE | Admit: 2022-11-12 | Discharge: 2022-11-12 | Disposition: A | Discharge status: Home / Self Care | Payer: BLUE CROSS/BLUE SHIELD | Source: Ambulatory Visit | Attending: Obstetrics and Gynecology | Admitting: Obstetrics and Gynecology

## 2022-11-12 ENCOUNTER — Other Ambulatory Visit: Payer: Self-pay

## 2022-11-12 ENCOUNTER — Encounter: Payer: Self-pay | Admitting: Family Medicine

## 2022-11-12 ENCOUNTER — Ambulatory Visit: Payer: BLUE CROSS/BLUE SHIELD | Attending: Obstetrics and Gynecology | Admitting: Obstetrics and Gynecology

## 2022-11-12 VITALS — BP 82/62 | Ht 68.0 in | Wt 145.0 lb

## 2022-11-12 DIAGNOSIS — Z79890 Hormone replacement therapy (postmenopausal): Secondary | ICD-10-CM | POA: Insufficient documentation

## 2022-11-12 DIAGNOSIS — Z01419 Encounter for gynecological examination (general) (routine) without abnormal findings: Secondary | ICD-10-CM

## 2022-11-12 DIAGNOSIS — R5383 Other fatigue: Secondary | ICD-10-CM | POA: Insufficient documentation

## 2022-11-12 DIAGNOSIS — G479 Sleep disorder, unspecified: Secondary | ICD-10-CM

## 2022-11-12 LAB — PROGESTERONE: Progesterone: <0.4 ng/mL

## 2022-11-12 LAB — TSH: TSH: 2.08 u[IU]/mL (ref 0.27–4.20)

## 2022-11-12 LAB — DHEA-SULFATE: DHEA Sulfate: 125 ug/dL (ref 19–205)

## 2022-11-12 LAB — ESTRADIOL: Estradiol: 54 pg/mL

## 2022-11-12 MED ORDER — ESTRADIOL 0.0375 MG/24HR TD PTTW *A*
1.0000 | MEDICATED_PATCH | TRANSDERMAL | 1 refills | Status: DC
Start: 2022-11-12 — End: 2023-03-18

## 2022-11-12 MED ORDER — PROGESTERONE 200 MG PO CAPS *I*
ORAL_CAPSULE | ORAL | 1 refills | Status: DC
Start: 2022-11-12 — End: 2023-03-18

## 2022-11-12 NOTE — Patient Instructions (Signed)
Healthy Practices for Women of all Ages     In addition to your regular OB/GYN history, physical, cholesterol and diabetes screenings, cervical cancer screen, and other recommended cancer screening tests, we ask that you review this list of "healthy practices."  Modifying your daily activities according to these practices may improve your overall health and well-being.  In the event of questions about this list, ask your health care provider.  Additionally, there are specially written pamphlets available, which offer further information.    Diet and Exercise:  Limit fat and cholesterol; emphasize fruits, grains and vegetables.  Consume dairy products or use calcium supplementation for adequate calcium intake (1200 mg or more).  Add folic acid supplementation 0.4 mg (400 micrograms, present in most daily multivitamins) at least two months before considering pregnancy to reduce the risks of birth defects.  Participate in regular exercise for 30 minutes at least five times a week, and consider weight training.    Injury Prevention:  Seat/lap belts should be worn while in a moving car.  Helmet should be used when using motorcycles, bicycles, roller blades and ATV's or skiing.  Place approved smoke detectors in your house and replace the batteries twice a year.    Guns and other firearms should be stored unloaded and in a locked area.  Trigger locks should be used as well.  Consider CPR training for household members.  Do not Text and Drive.    Dental Health:  Schedule regular visits to the dentist.  Floss and brush with fluoride toothpaste daily.    Immunizations:  A tetanus/diphtheria booster shot (d/T) is recommended every 10 years.  An MMR vaccine is recommended for non-pregnant women born after 1954 without proof of immunity of documentation of previous immunization.  Adults who are susceptible to varicella (chicken pox) should be vaccinated.  Influenza vaccine is indicated yearly for all patients.  Pneumococcal  pneumonia vaccine is indicated for age 65 and older once in your life.  Hepatitis A and/or B vaccines are recommended for high-risk individuals.    Substance Abuse:  Stop smoking; do not use any other tobacco products.  Avoid alcohol use when driving, boating, swimming or operating other machinery. Avoid excessive use of alcoholic beverages.  Recreational drug use (marijuana, cocaine, etc.) is dangerous and can be habit-forming.    Sexual Behavior:  Be sure to use contraception if pregnancy is not desired.  Regular use of female or female condoms with spermicide helps prevent STD's.  Consider HIV testing if:  You have had more than one sexual partner.  You have had any STD's.  You have used intravenous drugs.  You have a sexual partner with these (the above) risk factors.  Your sexual partner has had female homosexual exposure.  You received a blood transfusion during 1978-1985.    Breast Health:  A mammogram should be done once at age 40, then every 1-2 years based on risk factors.  If there is a strong family history of premenopausal breast cancer, you may need to start with mammograms earlier than age 40.    Colon Cancer Surveillance:  Beginning at age 45, colonoscopy. Follow up colonoscopy as is advised, thereafter     Women and Alcohol  For women, alcohol use carries some unique and special concerns. Most studies on alcohol have been on men, but we now know women may respond differently to alcohol.   Concerns: Liver damage, cancer, menstrual cycle changes, birth defects, fetal alcohol syndrome, intoxication, nutrition and weight management.    All women should avoid all alcohol during pregnancy.  Help for Problem Drinking:  One in every three members of Alcoholics Anonymous (AA) is now a woman.  AA meetings are held regularly and have led the way in demonstrating the effectiveness of self-help.  Outpatient and Inpatient Treatment are also available.  Ask your health care provider for a referral.  The first step in  successful treatment is to recognize that there is a problem and accept help.    Dietary and Supplemental Calcium  It is important that you build and protect your bone mass through a program of regular exercise and proper nutrition with adequate amounts of calcium in your diet. Dairy products are considered to be the most important sources of calcium.

## 2022-11-12 NOTE — Telephone Encounter (Signed)
Patient is scheduled for 11/18/22 with NP
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
## 2022-11-18 ENCOUNTER — Other Ambulatory Visit: Payer: Self-pay

## 2022-11-18 ENCOUNTER — Ambulatory Visit: Payer: BLUE CROSS/BLUE SHIELD | Attending: Primary Care | Admitting: Family Medicine

## 2022-11-18 ENCOUNTER — Other Ambulatory Visit
Admission: RE | Admit: 2022-11-18 | Discharge: 2022-11-18 | Disposition: A | Discharge status: Home / Self Care | Payer: BLUE CROSS/BLUE SHIELD | Source: Ambulatory Visit | Attending: Family Medicine | Admitting: Family Medicine

## 2022-11-18 VITALS — BP 130/80 | HR 96 | Temp 97.8°F | Ht 68.0 in | Wt 143.0 lb

## 2022-11-18 DIAGNOSIS — G4733 Obstructive sleep apnea (adult) (pediatric): Secondary | ICD-10-CM

## 2022-11-18 DIAGNOSIS — D649 Anemia, unspecified: Secondary | ICD-10-CM

## 2022-11-18 LAB — TIBC
Iron: 81 ug/dL (ref 34–165)
TIBC: 296 ug/dL (ref 250–450)
Transferrin Saturation: 27 % (ref 15–50)

## 2022-11-18 LAB — FERRITIN: Ferritin: 80 ng/mL (ref 10–120)

## 2022-11-18 LAB — CBC
Hematocrit: 41 % (ref 34–49)
Hemoglobin: 13.4 g/dL (ref 11.2–16.0)
MCV: 93 fL (ref 75–100)
Platelets: 261 10*3/uL (ref 150–450)
RBC: 4.4 MIL/uL (ref 4.0–5.5)
RDW: 11.9 % (ref 0.0–15.0)
WBC: 6.4 10*3/uL (ref 3.5–11.0)

## 2022-11-18 NOTE — Progress Notes (Signed)
Subjective: Gail Jensen is a 62 year old female with a history of severe OSA who presents today to discuss a referral for Inspire.  She wears her CPAP consistently but still wakes up in the night with it.  She has an app that tracks her snoring and she continues to snore while wearing the CPAP.  She was living in [place] she looked into getting the surgery, however, then she needs to have back surgery.  She remembers being told she would not be able to have MRIs or CT scans if she had the Insire device implanted.  She postponed this and had her back surgeries and is feeling great.  She has been feeling very fatigued, low energy, poor memory, and occasionally dizziness or lightheaded.  She has a history of anemia which improved after being diagnosed with celiac disease almost 10 years ago.  She has not had her iron levels checked in a while.    Medication list is reviewed and updated in Epic. Medication changes are made.     Past medical history including medications and allergies reviewed in Epic, no changes.     Current Outpatient Medications on File Prior to Visit   Medication Sig Dispense Refill    estradiol (VIVELLE-DOT) 0.0375 MG/24HR Apply 1 patch onto the skin twice a week. 24 patch 1    progesterone (PROMETRIUM) 200 MG capsule Use one po from day 1 to day 15 of the month 45 capsule 1    RABEprazole (ACIPHEX) 20 MG tablet TAKE 1 TABLET BY MOUTH 2 TIMES DAILY 60 tablet 5    tiZANidine (ZANAFLEX) 4 mg capsule Take 1 capsule (4 mg total) by mouth 2 times daily as needed for Muscle spasms. 30 capsule 1    amphetamine-dextroamphetamine (ADDERALL) 30 mg tablet Take 1 tablet by mouth every morning. Max daily dose: 1 tablet 30 tablet 0    bimatoprost (LATISSE) 0.03 % ophthalmic solution Place 1 drop into both eyes once a week  Use on bottom lashes at night. 3 mL 1    traZODone (DESYREL) 150 mg tablet Take 1 tablet (150 mg total) by mouth nightly 90 tablet 1    ibuprofen (ADVIL,MOTRIN) 800 mg tablet Take 1 tablet  (800 mg total) by mouth 3 times daily as needed for Pain 90 tablet 1    acetaminophen (TYLENOL) 500 mg tablet Take 2 tablets (1,000 mg total) by mouth 3 times daily  for Pain 100 tablet 0    lamoTRIgine (LAMICTAL) 100 mg tablet Take 1.5 tablets (150 mg total) by mouth daily.      clobetasol (TEMOVATE) 0.05 % external solution Apply 1 g topically as needed      fluorouracil (EFUDEX) 5 % cream Apply 1 g topically as needed      lidocaine (XYLOCAINE) 5 % ointment Apply small amount topically ever 6 hours as needed for hemorrhoid pain      NONFORMULARY, OTHER, ORDER *I* 1 each  CPAP      valACYclovir (VALTREX) 500 mg tablet Take 1 tablet (500 mg total) by mouth 2 times daily.      sertraline (ZOLOFT) 100 mg tablet Take 1 tablet (100 mg total) by mouth daily.      Multiple Vitamin (DAILY-VITE MULTIVITAMIN) TABS Take 1 tablet by mouth daily       No current facility-administered medications on file prior to visit.         Patient Active Problem List   Diagnosis Code    Lumbar radiculopathy M54.16    Attention  deficit hyperactivity disorder (ADHD) F90.9    Celiac disease K90.0    Gastroesophageal reflux disease K21.9    Psoriasis of scalp L40.9    Bipolar disorder F31.9        Objective:   BP 130/80   Pulse 96   Temp 36.6 C (97.8 F) (Temporal)   Ht 1.727 m (5\' 8" )   Wt 64.9 kg (143 lb)   SpO2 98%   BMI 21.74 kg/m    General: Well developed, well nourished, no acute distress.  Cardiovascular: Regular rate and rhythm, no murmurs, no rubs, normal S1 and S2.  Respiratory: Lungs clear to auscultation, no wheezes, no rhonchi.    Assessment/Plan:   1. OSA (obstructive sleep apnea)  AMB REFERRAL TO OTOLARYNGOLOGY      2. Anemia, unspecified type  CBC    TIBC    Ferritin         1.  OSA: I reviewed the Inspire website and Dr. Wynn Maudlin with [facility] is a certified provider.  Referral placed for ENT.  We discussed they will call her to schedule.  She has her previous sleep study information at home and she  will bring this to the consult.    2.  Anemia: She will get CBC, TIBC, ferritin level checked today.    Also see patient instructions.    After visit summary is printed and given to patient.    This note was dictated wholly or in part with Dragon voice recognition software.

## 2022-11-19 ENCOUNTER — Telehealth: Payer: Self-pay

## 2022-11-19 ENCOUNTER — Telehealth: Payer: Self-pay | Admitting: Family Medicine

## 2022-11-19 NOTE — Telephone Encounter (Signed)
Pt advised of below and agreeable to plan.

## 2022-11-19 NOTE — Telephone Encounter (Signed)
..  Please review and advise this inspire referral.    Thank you

## 2022-11-19 NOTE — Telephone Encounter (Signed)
-----   Message from Albertina Senegal, NP sent at 11/19/2022  7:49 AM EDT -----  Please let Roselee know her blood work is normal.

## 2022-11-20 LAB — TESTOSTERONE, FREE BY LC-MS/MS (ADULT FEMALES, CHILDREN OR INDIVIDUALS ON TESTOSTERONE-SUPPRESSING HORMONE THERAPY): Testosterone,Total: 13 ng/dL (ref 8–60)

## 2022-11-20 NOTE — Telephone Encounter (Signed)
LVM

## 2022-11-23 ENCOUNTER — Encounter: Payer: Self-pay | Admitting: Family Medicine

## 2022-11-23 LAB — TESTOSTERONE, FREE BY LC-MS/MS (ADULT FEMALES, CHILDREN OR INDIVIDUALS ON TESTOSTERONE-SUPPRESSING HORMONE THERAPY): TESTOSTERONE,FREE: 0.3 ng/dL (ref 0.13–0.90)

## 2022-11-24 ENCOUNTER — Other Ambulatory Visit: Payer: Self-pay | Admitting: Family Medicine

## 2022-11-24 ENCOUNTER — Other Ambulatory Visit: Payer: Self-pay

## 2022-11-24 ENCOUNTER — Encounter: Payer: Self-pay | Admitting: Otolaryngology

## 2022-11-24 ENCOUNTER — Encounter: Payer: Self-pay | Admitting: Obstetrics and Gynecology

## 2022-11-24 NOTE — Telephone Encounter (Signed)
Patient's insurance has been updated.

## 2022-11-25 MED ORDER — AMPHETAMINE-DEXTROAMPHETAMINE 30 MG PO TABS *I*
1.0000 | ORAL_TABLET | Freq: Every morning | ORAL | 0 refills | Status: DC
Start: 2022-11-25 — End: 2023-03-02

## 2022-11-25 NOTE — Telephone Encounter (Signed)
Last appointment: 11/18/2022  Next appointment: 03/30/2023

## 2023-01-02 ENCOUNTER — Encounter: Payer: Self-pay | Admitting: Family Medicine

## 2023-01-12 ENCOUNTER — Encounter: Payer: Self-pay | Admitting: Family Medicine

## 2023-01-13 ENCOUNTER — Ambulatory Visit: Payer: Commercial Managed Care - PPO | Admitting: Family Medicine

## 2023-01-13 ENCOUNTER — Telehealth: Payer: Self-pay

## 2023-01-13 ENCOUNTER — Other Ambulatory Visit: Payer: Self-pay

## 2023-01-13 VITALS — BP 118/80 | HR 85 | Temp 98.0°F | Ht 68.0 in | Wt 136.0 lb

## 2023-01-13 DIAGNOSIS — R42 Dizziness and giddiness: Secondary | ICD-10-CM

## 2023-01-13 NOTE — Progress Notes (Signed)
Subjective:   Linn Geerdes is a 62 y.o. female with No past medical history on file.     Presented to the office today for:  Chief Complaint   Patient presents with    Follow-up     Light headed, dizzy - a couple episodes, loss of appetite       HPI    Here complaining lightheadedness, dizziness, couple episodes the last month to 2 months, sometimes it happens when she is walking the grocery store, sometimes happens when she gets out of the car, denies any positional changes related symptoms and    , Also has loss of appetite and decreased appetite    No change in medication dosage or additions    Echocardiogram will order    She also admits to sleep deprivation and decreased sleep which could also cause the symptoms    No other complaints or concerns    Review of Systems              The patient has a   Family History   Problem Relation Age of Onset    Bladder Cancer Mother     Liver cancer Mother     Anesthesia problems Neg Hx        Objective:   BP 118/80   Pulse 85   Temp 36.7 C (98 F)   Ht 1.727 m (5\' 8" )   Wt 61.7 kg (136 lb)   SpO2 97%   BMI 20.68 kg/m    BP Readings from Last 3 Encounters:   01/13/23 118/80   11/18/22 130/80   11/12/22 (!) 82/62       Physical Exam    Lab Results   Component Value Date    WBC 6.4 11/18/2022    HGB 13.4 11/18/2022    HCT 41 11/18/2022    PLT 261 11/18/2022    CHOL 182 03/24/2022    TRIG 141 03/24/2022    HDL 47 03/24/2022    ALT 27 03/11/2022    AST 27 03/11/2022    NA 139 04/01/2022    K 4.4 04/01/2022    CL 102 04/01/2022    CO2 27 04/01/2022    TSH 2.08 11/12/2022     No components found for: "CALCIUM", "PHOS"  No components found for: "LDLCALC", "LDLCHOLESTEROL", "LDLDIRECT"    Assessment and Plan:     1. Dizziness  - Echo Complete; Future            Requested Prescriptions      No prescriptions requested or ordered in this encounter       There are no discontinued medications.    Cala Bradford received counseling on the following healthy behaviors: nutrition,  exercise and medication adherence    Discussed use,benefit, and side effects of prescribed medications.  Barriers to medication compliance addressed.      All patient questions answered.  Pt voiced understanding.     No follow-ups on file.        Disclaimer: Some orall of this note was transcribed using voice-recognition software.This may cause typographical errors occasionally. Although all effort is made to fix these errors, please do not hesitate to contact our office if there isany concern with the understanding of this note.

## 2023-01-13 NOTE — Telephone Encounter (Signed)
Informed patient via MyChart message regarding Echo scheduling.

## 2023-02-03 ENCOUNTER — Other Ambulatory Visit: Payer: Self-pay | Admitting: Family Medicine

## 2023-02-03 NOTE — Telephone Encounter (Signed)
Last appointment: 01/13/2023  Next appointment: 03/30/2023

## 2023-02-19 ENCOUNTER — Other Ambulatory Visit: Payer: Commercial Managed Care - PPO

## 2023-03-02 ENCOUNTER — Encounter: Payer: Self-pay | Admitting: Otolaryngology

## 2023-03-02 ENCOUNTER — Ambulatory Visit: Payer: Commercial Managed Care - PPO | Attending: Otolaryngology | Admitting: Otolaryngology

## 2023-03-02 ENCOUNTER — Other Ambulatory Visit: Payer: Self-pay

## 2023-03-02 VITALS — BP 133/69 | HR 63 | Temp 96.8°F | Ht 68.0 in | Wt 136.0 lb

## 2023-03-02 DIAGNOSIS — Z789 Other specified health status: Secondary | ICD-10-CM

## 2023-03-02 DIAGNOSIS — G4700 Insomnia, unspecified: Secondary | ICD-10-CM

## 2023-03-02 DIAGNOSIS — G4733 Obstructive sleep apnea (adult) (pediatric): Secondary | ICD-10-CM

## 2023-03-02 NOTE — Progress Notes (Signed)
Gail Jensen is a 62 y.o. female who presents on 03/02/23 with concerns over problems with sleep. She was diagnosed with OSA in '16. This was diagnosed as severe. She was prescribed CPAP. When she uses it she develops insomnia as it prevents her to sleep. She's tried various masks and finds one of them fit. She's tried nasal, nasal pillows, full face mask.  They don't work for her.  When she tries it, she'll only get 3 hours of sleep per night. Last sleep study in '16.     She has been feeling very fatigued, low energy, poor memory, and occasionally dizziness or lightheaded. She has a history of anemia which improved after being diagnosed with celiac disease almost 10 years ago.     She sleeps about 3 hours per night.     Surgery of the head/neck:  no.      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 Not employed under [employer].     Insomnia:  Yes. Gummies/trazodone doesn't help.      Nightmare / other sleep disorders : No.    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 Not triggered by startle response.     Answers submitted by the patient for this visit:  Other - Sleep Problem (Submitted on 02/23/2023)  Chief Complaint: Sleeping problems  What is your goal for today's visit?: To determine why i cant sleep, better brain functionality. Inspire implant.     Epworth Sleepiness Scale of 7 as of 03/02/23    Sitting and reading ________________________________________3  Watching TV __________________________________________1  Sitting, inactive in a public place (e.g. a theatre or a meeting) _______0  As a passenger in a car for an hour without a break ____________2  Lying down to rest in the afternoon when circumstances permit ______1  Sitting and talking to someone _____________________________0  Sitting quietly after a lunch without alcohol _______________________0  In a car, while stopped for a few minutes in the traffic ___________0    History:    has a past medical history of Anemia, Anxiety, Arthritis, Depression,  and GERD (gastroesophageal reflux disease).   has a past surgical history that includes Cesarean Section, Unspecified (1996); Breast enhancement surgery (Bilateral, 11/1998); laminectomy (08/2021); pr arthrodesis combined tq 1ntrspc lumbar (N/A, 03/16/2022); pr reinsertion spinal fixation device (N/A, 04/01/2022); Eye surgery; and Spine surgery (09-05-21, Sept. 2023, Oct. 2024).   reports that she has never smoked. She has never used smokeless tobacco. She reports that she does not currently use alcohol after a past usage of about 5.0 standard drinks of alcohol per week. She reports that she does not currently use drugs after having used the following drugs: Other-see comments.  family history includes Anxiety disorder in her mother; Arthritis in her maternal grandmother; Bladder Cancer in her mother; Cancer in her mother; Depression in her daughter, mother, sister, and son; Diabetes in her maternal aunt; GERD in her mother; Kidney Disease in her mother; Liver cancer in her mother.    Allergies:   Ciprofloxacin, Sulfa antibiotics, and Sulfasalazine     Medications:     Current Outpatient Medications   Medication Sig    traZODone (DESYREL) 150 mg tablet TAKE 1 TABLET BY MOUTH NIGHTLY    estradiol (VIVELLE-DOT) 0.0375 MG/24HR Apply 1 patch onto the skin twice a week.    progesterone (PROMETRIUM) 200 MG capsule Use one po from day 1 to day 15 of the month    RABEprazole (ACIPHEX) 20 MG tablet TAKE 1 TABLET BY MOUTH 2 TIMES  DAILY    tiZANidine (ZANAFLEX) 4 mg capsule Take 1 capsule (4 mg total) by mouth 2 times daily as needed for Muscle spasms.    bimatoprost (LATISSE) 0.03 % ophthalmic solution Place 1 drop into both eyes once a week  Use on bottom lashes at night.    ibuprofen (ADVIL,MOTRIN) 800 mg tablet Take 1 tablet (800 mg total) by mouth 3 times daily as needed for Pain    lamoTRIgine (LAMICTAL) 100 mg tablet Take 1.5 tablets (150 mg total) by mouth daily.    clobetasol (TEMOVATE) 0.05 % external solution Apply 1  g topically as needed    fluorouracil (EFUDEX) 5 % cream Apply 1 g topically as needed    lidocaine (XYLOCAINE) 5 % ointment Apply small amount topically ever 6 hours as needed for hemorrhoid pain    NONFORMULARY, OTHER, ORDER *I* 1 each  CPAP    valACYclovir (VALTREX) 500 mg tablet Take 1 tablet (500 mg total) by mouth 2 times daily.    sertraline (ZOLOFT) 100 mg tablet Take 1 tablet (100 mg total) by mouth daily.    Multiple Vitamin (DAILY-VITE MULTIVITAMIN) TABS Take 1 tablet by mouth daily    amphetamine-dextroamphetamine (ADDERALL) 30 mg tablet Take 1 tablet by mouth every morning. Max daily dose: 1 tablet    acetaminophen (TYLENOL) 500 mg tablet Take 2 tablets (1,000 mg total) by mouth 3 times daily  for Pain       BP 133/69   Pulse 63   Temp 36 C (96.8 F) (Temporal)   Ht 1.727 m (5\' 8" )   Wt 61.7 kg (136 lb)   BMI 20.68 kg/m   Pain Score:  0 - No pain       General:   Normocephalic, well nourished, with appropriate affect in NAD.  A &O x 3.     Head and Face:  The head and face reveal normal facial symmetry without lesions or scars. The salivary glands are palpated and appear normal without tenderness or mass.    EYES:  Non icteric, normal conjunctiva.  EOMI.       Nose:    External nose is normal. The nasal mucosa is not inflamed. The nasal septum is generally midline and there is no evidence of septal perforation or hematoma. The inferior turbinates are normal without masses or obstructions. No polyps are visualized. The paranasal sinuses are non tender.   Mouth / Throat:   The lips, teeth, tongue and buccal mucosa appear normal without lesions or inflammation.  The uvula and soft palate are normal.  The tonsils are non erythematous nor swollen.  Oropharynx is without lesion, inflammation or swelling.   Neck:  There is no asymmetry or cervical lymphadenopathy noted. There are no evident masses, trachea is midline and the thyroid gland reveals no enlargement, tenderness nor mass effect.          62  y.o. female with OSA, CPAP intolerance, COMISA     Plan:     CPAP discussed as the gold standard. I also discussed that about 50% of patients cannot tolerate therapy.  CPAP alternatives were discussed and depends on OSA severity.  Our visit focused mostly on hypoglossal nerve stimulation surgery. The work up for CPAP alternatives was discussed including a drug induced sleep endoscopy with or without PAP.  Handout on Insomnia is given with options for online/mobile phone apps for CBTi.  Sleep study ordered. Follow up after sleep study results.

## 2023-03-05 ENCOUNTER — Encounter: Payer: Self-pay | Admitting: Family Medicine

## 2023-03-08 ENCOUNTER — Ambulatory Visit: Payer: Commercial Managed Care - PPO | Admitting: Primary Care

## 2023-03-08 ENCOUNTER — Encounter: Payer: Self-pay | Admitting: Primary Care

## 2023-03-08 DIAGNOSIS — F319 Bipolar disorder, unspecified: Secondary | ICD-10-CM

## 2023-03-08 DIAGNOSIS — Z659 Problem related to unspecified psychosocial circumstances: Secondary | ICD-10-CM

## 2023-03-08 DIAGNOSIS — Z9149 Other personal history of psychological trauma, not elsewhere classified: Secondary | ICD-10-CM

## 2023-03-08 DIAGNOSIS — F909 Attention-deficit hyperactivity disorder, unspecified type: Secondary | ICD-10-CM

## 2023-03-08 NOTE — Patient Instructions (Addendum)
How to find a therapist  Through insurance - https://www.psychologytoday.com/us  Not through insurance - ListingMagazine.si . Anne Shutter, [facility] www.heldandfreetherapy.com   Other mental health resources  Betterhelp   Netflix - headspace   Calm   https://www.therapistaid.com    Start Betterhelp   Refer to psychiatry to help with med management  - put in referral   DoubleCredits.dk - will send mychart message
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
## 2023-03-09 ENCOUNTER — Encounter: Payer: Self-pay | Admitting: Primary Care
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
## 2023-03-11 ENCOUNTER — Telehealth: Payer: Self-pay | Admitting: Otolaryngology

## 2023-03-11 ENCOUNTER — Telehealth: Payer: Self-pay

## 2023-03-11 NOTE — Telephone Encounter (Signed)
Copied from CRM [id number]. Topic: Appointments - Appointment Information  >> Mar 11, 2023  1:35 PM Sharyon Cable wrote:  Patient, Gail Jensen telephoned to schedule HST ordered by Vallery Sa, MD for OSA (obstructive sleep apnea); Intolerance of continuous positive airway pressure (CPAP) ventilation / No Auth Needed    HST Scheduled for March 16, 2023 @ 3:00 pm     Patient is hoping to p/up the Kit around 1:00 pm as she is scheduled to work and would like a callback to discuss / Phone #[phone].

## 2023-03-11 NOTE — Telephone Encounter (Signed)
Spoke to patient, she will pick up HST at 12:45 pm on her way to work.  Sent HST instructions through [employer].

## 2023-03-11 NOTE — Telephone Encounter (Signed)
Lm asking patient to call back and schedule HST direct ENT. No auth req and ok to schedule first available.

## 2023-03-11 NOTE — Telephone Encounter (Signed)
HST  Tarek Siala  No authorization required.

## 2023-03-15 NOTE — Progress Notes (Signed)
[facility]          HOME SLEEP STUDY POST TEST QUESTIONNAIRE      Patient Name:        Gail Jensen    MRN #  [id number]      What was your approximate bedtime? _________________________       How long did it take you to fall asleep? _________________________      How many times did you wake up? _________________________      Approximately what time? / How long were you awake?    _____________/_______________    _____________/_______________    _____________/_______________    _____________/_______________    At what time did you get up for the day? __________________________    Any problems or comments?  __________________________               Patient signature:______________________   Date:_______________            Please Complete this page  &  Return with Sleep Study Case.Marland KitchenMarland KitchenThank You!                                                                                                   [facility]  HOME SLEEP TESTING EQUIPMENT LOAN AGREEMENT  Please help [place] care for your family, friends, and neighbors by returning the equipment on-time.  If you don't return the equipment by the date stated below, you may delay another patient's diagnosis and treatment.      PATIENT NAME:  Gail Jensen    MRN#:   [id number]  PHONE NUMBER: [phone]  I have received the Home Sleep Monitor ("Equipment") identified below from the [facility]") and have been instructed on how to use it. I agree that if I have questions on use of the equipment to call [facility] at [phone]; iN AN EMERGENCY I WILL CALL 911.   Serial Number:    [id number]  I agree to return the equipment to [facility], in the case provided, by________________________, between 6:30 AM and 4:30 PM, TO the SLEEP CENTER'S FRONT DESK, via curbside service directly to a staff member or in hsat drop-box at [address] [facility] [place], [place] [address].  I agree to return the Equipment promptly  and in the same condition that I borrowed it by the date stated above. I understand the Equipment is and at all times shall remain [facility]'s property. I acknowledge the cost to replace the Equipment is at least $3,000. I agree to be responsible for the cost of repairing or replacing the Equipment, if it is damaged, lost, confiscated, or stolen, until it is returned to [facility].    I understand and agree that if I fail to return the Equipment, including because it is lost, stolen, or confiscated, by the date stated above, or if the Equipment is damaged when returned, that [facility] may: i) bill me for the cost of the equipment, including by adding the cost to any outstanding balance; ii) sue me to recover the cost of replacing or  repairing the Equipment; and iii) take steps to terminate my physician-patient relationship with the Sleep Center. I agree to pay the [facility] costs, including reasonable attorneys' fees, for collecting the cost of repairing or replacing the Equipment.   IF YOU CANNOT RETURN THE EQUIPMENT BY THE DATE STATED ABOVE OR IF THE EQUPMENT IS lost, stolen or damaged, you must immediately contact us AT [phone].                                 [facility]  Home Sleep Study Guide      For Home Sleep Study set-up instructions watch the following video: Click the link sent via a MyChart message [email or web address]  or search the Internet for [employer] video.  Refer to the provided instructions as needed.    To initiate recording simply place the blue belt around your chest making sure both ends of the belt are fully connected to the device.  This belt action automatically starts and stops the apnea screening device. The On/Off quadrant light spins for 30 seconds.  The 3 sensor lights flash yellow until they recognize a good signal changing to solid green.  Once all 3 turn green the On/Off light also turns solid green for 1 minute, then they'll shut off for the  night one by one.  The device does not need to be on your skin, can go over top of nightwear.    We suggest that you adequately tape the cannula and oximeter, and sleep in whatever position you prefer.  If you get up during the night you can leave the equipment on, feel free to remove the finger probe briefly while up, just put back on a finger when returning to bed.      In the morning, unplug one side of the belt to stop the study and discard nasal cannula before placing equipment back in the box.  All other equipment can remain attached to the unit.      You can return your testing equipment in our after-hours drop-box available 24/7.  If you would like an immediate download to confirm we captured adequate quality data please return the device during the preferred hours of 6:30 AM & 4:30 PM    To report a curbside device drop-off please park in one of our designated HST parking spaces and call [phone] upon arrival.  We can retrieve the device from your vehicle and check to make sure we have enough sleep data sample for MD review within a few minutes.  Staff is available for drop-off and immediate downloading of the device until 4:30 pm Monday through Friday.    If unable to return the device timely please call us @ [phone].    Results will be communicated via MyChart or during your scheduled follow up visit.      If you or someone in your home develop COVID-19 symptoms within 24 hours of the study please call the lab at [phone].                                                    [facility]  HOME SLEEP TESTING EQUIPMENT LOAN AGREEMENT  Please help [place] care for your family, friends, and neighbors by returning the equipment  on-time.  If you don't return the equipment by the date stated below, you may delay another patient's diagnosis and treatment.      PATIENT NAME:  Gail Jensen     MRN#:   [id number]   PHONE NUMBER: [phone]    I have received the Home Sleep Monitor  ("Equipment") identified below from the [facility]") and have been instructed on how to use it. I agree that if I have questions on use of the equipment to call [facility] at [phone]; iN AN EMERGENCY I WILL CALL 911.   Serial Number:   [id number]  I agree to return the equipment to [facility], in the case provided, by________________________, between 6:30 AM and 4:30 PM, TO the SLEEP CENTER'S FRONT DESK, via curbside service directly to a staff member or in hsat drop-box at [address] [facility] [place], [place] [address].  I agree to return the Equipment promptly and in the same condition that I borrowed it by the date stated above. I understand the Equipment is and at all times shall remain [place]'s property. I acknowledge the cost to replace the Equipment is at least $3,000. I agree to be responsible for the cost of repairing or replacing the Equipment, if it is damaged, lost, confiscated, or stolen, until it is returned to [facility].    I understand and agree that if I fail to return the Equipment, including because it is lost, stolen, or confiscated, by the date stated above, or if the Equipment is damaged when returned, that [facility] may: i) bill me for the cost of the equipment, including by adding the cost to any outstanding balance; ii) sue me to recover the cost of replacing or repairing the Equipment; and iii) take steps to terminate my physician-patient relationship with the Sleep Center. I agree to pay the [facility] costs, including reasonable attorneys' fees, for collecting the cost of repairing or replacing the Equipment.   IF YOU CANNOT RETURN THE EQUIPMENT BY THE DATE STATED ABOVE OR IF THE EQUPMENT IS lost, stolen or damaged, you must immediately contact us AT [phone].    Signed:  ____________________________  Date: _________      Name: Gail Jensen

## 2023-03-16 ENCOUNTER — Encounter: Payer: Self-pay | Admitting: Obstetrics and Gynecology

## 2023-03-16 ENCOUNTER — Ambulatory Visit: Payer: Commercial Managed Care - PPO | Attending: Sleep Medicine | Admitting: Sleep Medicine

## 2023-03-16 ENCOUNTER — Other Ambulatory Visit: Payer: Self-pay

## 2023-03-16 DIAGNOSIS — Z789 Other specified health status: Secondary | ICD-10-CM | POA: Insufficient documentation

## 2023-03-16 DIAGNOSIS — G4733 Obstructive sleep apnea (adult) (pediatric): Secondary | ICD-10-CM | POA: Insufficient documentation

## 2023-03-18 ENCOUNTER — Encounter: Payer: Self-pay | Admitting: Obstetrics and Gynecology

## 2023-03-18 ENCOUNTER — Ambulatory Visit: Payer: Commercial Managed Care - PPO | Attending: Obstetrics and Gynecology | Admitting: Obstetrics and Gynecology

## 2023-03-18 DIAGNOSIS — R61 Generalized hyperhidrosis: Secondary | ICD-10-CM

## 2023-03-18 DIAGNOSIS — Z780 Asymptomatic menopausal state: Secondary | ICD-10-CM

## 2023-03-18 DIAGNOSIS — Z79890 Hormone replacement therapy (postmenopausal): Secondary | ICD-10-CM

## 2023-03-18 MED ORDER — ESTRADIOL 0.05 MG/24HR TD PTTW *I*
1.0000 | MEDICATED_PATCH | TRANSDERMAL | 2 refills | Status: AC
Start: 2023-03-18 — End: ?

## 2023-03-18 MED ORDER — PROGESTERONE 200 MG PO CAPS *I*
ORAL_CAPSULE | ORAL | 1 refills | Status: DC
Start: 2023-03-18 — End: 2023-06-14

## 2023-03-18 NOTE — Progress Notes (Signed)
RETURNED HSAT DEVICE AT [facility]    Data quality: Adequate Data - Analysis in progress    2nd Night: No - One Night Only    Patient Questionnaire Info: Completed    Follow up date: Visit date not found / D/R PROVIDER F/U     Lin Givens on 03/18/2023 at 1:26 PM

## 2023-03-18 NOTE — Progress Notes (Signed)
Telephone Visit     This is an established patient visit.    Mode of Communication with Patient for This Visit    Mode of Communication with Patient for This Visit: Audio Only  Audio Only: Reason: Patient prefers Audio Only  Patient Location: Home  Provider Location: Clinic         The service provided today can be effectively delivered without a visual or in-person component and includes all clinically appropriate documentation requirements as if provided in-person. Audio-visual will continue to be an option for this patient as well as in-person visits.      Reason for visit: Follow-up    HPI:  Altamease finds she is still having difficulty with sleep. She is working with sleep medicine and hoping to have an implantable device placed, if this is allowed. She notes she has night sweats that center between her navel and thighs. These are very uncomfortable and distressing. She has not had any change in medications, she notes no change in the generic of the patch she is using. She had been much more comfortable and then began to have sweats again, recently.     Patient's problem list, allergies, and medications were reviewed and updated as appropriate. Please see the EHR for full details.    Physical Exam:  Last visit note  Last PCP note, Sleep medicine note      Assessment Plan:  Post menopause  Night sweats, specific to umbilical area to the thighs    PLAN  Discussed, atypical for hot flash but could trial increased dose of estradiol  Estradiol 0.05 mg one patch twice weekly  Continue with Prometrium one po daily days 15-20 of each month  Discussed medications she takes may increase sweating, discussed potential check in  with dermatology re possible hyperhidrosis if increase estradiol dose does not seem to help  Might consider gabapentin for sleep and for sweats if dose does not improve symptoms  Will continue work with sleep medicine re insomnia     The plan was discussed with the patient and the patient/patient  rep demonstrated understanding to the provider's satisfaction.    Consent was previously obtained from the patient to complete this telephone consult; including the potential for financial liability.    11-20 minutes were spent on the phone with the patient, in chart review, and in care planning            Seleta Rhymes, NP

## 2023-03-18 NOTE — Progress Notes (Signed)
[facility]         [facility] HSAT PATIENT QUESTIONNAIRE    Patient Sleep Diary Entries    1)   What was your approximate bedtime? 9-10 :00 p.m.    2)   How long did it take you to fall asleep? Less than 30 minutes using my Rx Trazadone 150  3)  How many times do you remember waking up? 2-3     A) Approx. Time ? / How long were you awake?    4) What time did you finally wake up for the day?  6:30 a.m.    5)  Any problems or Comments?    None      [employer]

## 2023-03-23 NOTE — Procedures (Addendum)
Home Sleep Apnea Test Report    This is a report for an unattended home sleep apnea test completed utilizing a 6-channel Type III home sleep respiratory recorder via the [facility], on March 17, 2023.    The following parameters were monitored for a duration of 323.5 minutes: snoring (high frequency vibrations in airflow), oronasal pressure airflow, RIP chest effort, SpO2, pulse rate, and body position.  There was no EEG monitoring during this study, as such we were not able to discern between periods of sleep and wake. As such, home sleep apnea testing may underestimate the severity of sleep disordered breathing. The data obtained was generally of good quality.      This home sleep apnea test shows evidence of obstructive sleep apnea (OSA) of moderate severity. Utilizing the 3% oxyhemoglobin desaturation criteria for scoring obstructive hypopneas, in accordance with the American Academy of Sleep Medicine recommendations, the yielded unattended apnea hypopnea index (3% uAHI) was 25.2 respiratory events per hour of recording time. The 4% unattended apnea hypopnea index (4% uAHI, per CMS/Medicare criteria) was 21.9 respiratory events per hour of recording time. She did not have evidence of central sleep apnea. Central and/or mixed apneas comprised ~3% of her respiratory disturbances. Her oxyhemoglobin saturation was normal at baseline, with an oxyhemoglobin saturation nadir of 84%.     This study was electronically signed by Nadara Eaton, MD, interpreting physician, on 03/23/2023 at 5:32 PM.

## 2023-03-23 NOTE — Addendum Note (Signed)
Addended by: Nadara Eaton Z on: 03/23/2023 05:35 PM     Modules accepted: Level of Service

## 2023-03-24 ENCOUNTER — Encounter: Payer: Self-pay | Admitting: Otolaryngology

## 2023-03-24 MED ORDER — ESTRADIOL 0.05 MG/24HR TD PTTW *I*
1.0000 | MEDICATED_PATCH | TRANSDERMAL | 2 refills | Status: DC
Start: 2023-03-25 — End: 2023-06-14

## 2023-03-24 NOTE — Addendum Note (Signed)
Addended by: Marlow Baars on: 03/24/2023 07:55 AM     Modules accepted: Orders

## 2023-03-24 NOTE — Telephone Encounter (Signed)
Please resend estradiol patch prescription to [facility] on [address], thank you

## 2023-03-30 ENCOUNTER — Ambulatory Visit: Payer: BLUE CROSS/BLUE SHIELD | Admitting: Family Medicine

## 2023-04-09 ENCOUNTER — Encounter: Payer: Self-pay | Admitting: Orthopaedic Surgery

## 2023-04-12 ENCOUNTER — Ambulatory Visit: Payer: BLUE CROSS/BLUE SHIELD | Admitting: Orthopaedic Surgery

## 2023-04-12 DIAGNOSIS — Z981 Arthrodesis status: Secondary | ICD-10-CM

## 2023-04-13 ENCOUNTER — Ambulatory Visit: Payer: Self-pay | Admitting: Family Medicine

## 2023-04-15 ENCOUNTER — Encounter: Payer: Self-pay | Admitting: Family Medicine

## 2023-04-19 ENCOUNTER — Ambulatory Visit: Payer: BLUE CROSS/BLUE SHIELD | Admitting: Orthopaedic Surgery

## 2023-04-20 ENCOUNTER — Other Ambulatory Visit: Payer: Self-pay

## 2023-04-20 ENCOUNTER — Encounter: Payer: Self-pay | Admitting: Primary Care

## 2023-04-20 ENCOUNTER — Ambulatory Visit: Payer: Commercial Managed Care - PPO | Admitting: Primary Care

## 2023-04-20 VITALS — BP 140/79 | HR 71 | Temp 97.7°F | Ht 68.0 in | Wt 138.0 lb

## 2023-04-20 DIAGNOSIS — F489 Nonpsychotic mental disorder, unspecified: Secondary | ICD-10-CM

## 2023-04-20 DIAGNOSIS — G479 Sleep disorder, unspecified: Secondary | ICD-10-CM

## 2023-04-20 NOTE — Progress Notes (Signed)
Gail Jensen is a 62 y.o. female here for   Chief Complaint   Patient presents with    ADHD       HPI    Had cataract surgery in oct, still healing     Sleep - sleep study is done. And has follow up with sleep medicine  Mental health - has follow up with psych   Therapy -established with better [facility], had 2 sessions, had to put them on hold  Feeling stable   Needed to move but things are under control   Works at [employer] at [employer]     Is on the estradiol patch from ob   Takes rabeprazole as needed   Trazodone 150mg  nightly   Took 1/4 adderral this morning     Past medical, family and social history. Patient's medications, allergies, past medical, surgical, social and family histories were updated and marked as reviewed as appropriate    Objective  Physical Exam   BP 140/79   Pulse 71   Temp 36.5 C (97.7 F)   Ht 1.727 m (5\' 8" )   Wt 62.6 kg (138 lb)   SpO2 97%   BMI 20.98 kg/m     General - no acute distress   Pulmonary - non labored breathing         Assessment and Plan   1. Difficulty sleeping     Has follow-up with sleep medicine.  Is working with OB/GYN to help manage menopausal symptoms.     2. Mental health problem     She completed her intake with psychiatry and has an evaluation with them soon.  She will look into the genetic testing with the phone number and website that I had provided.         Follow up in about 5 weeks (around 05/25/2023).      Rita Ohara, MD   Family Medicine Locums Attending       [facility]  [address]  [place] [address]  Dept: [phone]  Dept Fax: [phone]        [facility] Line   phone: [phone] (press 1)  text: (321)423-7874 - website: https://www.veteranscrisisline.net [employer] Violence Hotline - Love is Respect   PartyAccommodations.com.br  call (219) 304-1598 or text LOVEIS 502-010-3651  TVComponents.no call 432-582-7527  (safe) or text start to 88788   National Child Abuse Hotline    ToledoInfo.at  call (302) 880-4483 [facility] Domestic Violence 24 hour hotline  MobileSolver.com.au  call (641) 247-1574 or text (405)803-4885 or 711 for head or hard of hearing   Physician Support Line   https://www.physiciansupportline.com/  https://www.physiciansupportline.com/physicians-who-need-help National Sexual Assault Hotline  http://hernandez-miller.com/  call 470-831-1533   Maurice March (serving transgender individuals)  Phone: 909-169-5995  Website: https://www.translifeline.org National Suicide Prevention Lifeline   Phone: 1-800-273-TALK (301) 083-9923)  https://suicidepreventionlifeline.org   Trevor Lifeline [LGBTQ] and people under age 29  Phone: 218-624-8702  Text: text "START" to 245809 - https://www.thetrevorproject.org  LGBT Hotline: 9050731026 Crisis Text Line   Text: (785)102-8305  https://www.crisistextline.org    Substance Abuse and Mental Health Services Administration  https://www.curtis-mann.com/    [employer] On Alcoholism & Drug Dependency Hope Line: 442-122-0924 Help Prevent Suicide   http://humphrey.com/    Self-Harm Hotline: 845-047-5633 lifeline chat and text - Text 988  RetroStamps.it   National Crisis Line - Anorexia And Bulimia: 469-627-3527  National Eating Disorders Association (NEDA): (202)091-4057, https://www.nationaleatingdisorders.org/ [employer]ician [employer] Community Services: 211, http://marquez.com/       Healing through Health, Education, Advocacy, and  Law Licensed conveyancer) Collaborative  Call 254-121-0791  https://www.Emery.CuisineCop.com.ee [facility] Hotline: 1-800-230-PLAN 947-106-0229)

## 2023-04-23 ENCOUNTER — Ambulatory Visit: Payer: Commercial Managed Care - PPO | Attending: Orthopedic Surgery | Admitting: Orthopaedic Surgery

## 2023-04-23 ENCOUNTER — Ambulatory Visit
Admission: RE | Admit: 2023-04-23 | Discharge: 2023-04-23 | Disposition: A | Discharge status: Home / Self Care | Payer: Commercial Managed Care - PPO | Source: Ambulatory Visit

## 2023-04-23 ENCOUNTER — Other Ambulatory Visit: Payer: Self-pay

## 2023-04-23 VITALS — BP 133/61 | HR 76

## 2023-04-23 DIAGNOSIS — M4316 Spondylolisthesis, lumbar region: Secondary | ICD-10-CM | POA: Insufficient documentation

## 2023-04-23 DIAGNOSIS — Z981 Arthrodesis status: Secondary | ICD-10-CM

## 2023-04-23 DIAGNOSIS — M2578 Osteophyte, vertebrae: Secondary | ICD-10-CM

## 2023-04-23 NOTE — Progress Notes (Signed)
HISTORY OF PRESENT ILLNESS    Gail Jensen presents to clinic today for routine postop visit.  Patient underwent revision of her right L4 cortical screw on 04/01/2022, L4-L5 instrumented fusion on 03/16/2022.  Patient is presenting today for a 1 year follow-up status post her most recent surgery.  She reports that overall she is doing well.  She continues to be highly active with no challenges for her lower back.  On a daily basis that she reports she feels that her back hurts, which can be relieved with ice/heat.  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  Patient has performed physical therapy right after her surgery.  Continues to perform home exercises.  Reports that her she utilizes ibuprofen for pain control.  Denies of any radicular symptoms.  She reports that she is interested in acupuncture to help with her lower back pain.  Overall patient is doing well and is delighted with her progress.    REVIEW OF SYSTEMS:   Noncontributory for head, eyes, ear, nose, and throat, cardiac, pulmonary, gastrointestinal, genitourinary, integumentary, psychiatric, endocrine, neurologic and musculoskeletal systems.       PHYSICAL EXAMINATION:   Vitals:    04/23/23 1356   BP: 133/61   Pulse: 76       Patient is  well-appearing and in no apparent distress.  They ambulate with a normal gait, no assist device.  Well healed incision.   Overall posture and alignment are within normal limits.    No gross motor or sensory deficits throughout the left lower extremity.  Slight weakness in the right tibialis anterior compared to the left, much improved from from prior exam.  Extremities are warm and well-perfused without evidence of peripheral edema.       IMAGING:    Upright lumbar spine AP and lateral radiographs obtained during today's visit were personally reviewed, demonstrates well-maintained hardware with no changes in alignment from previous  radiographs.    ASSESSMENT AND PLAN:    We had a lengthy detail with patient during today's visit regarding her clinical, physical examination, and radiographic findings.  We discussed that her hardware appears to be very well stable 1 year out from her surgery.  We encourage patient to continue to be active in her daily activities.  Patient would like to trial acupuncture for her lower back pain, prescription was provided at the end of today's visit.  We encouraged patient to consider utilizing different ergonomic desk's and chairs at work that might help address/improve her back pain.  We discussed that we would like to see her back in 1 year or as needed.  Patient reports that she is in agreement with the plan discussed above.  All questions invited and answered satisfaction.    Follow up:  1 year     X-rays/studies at follow up:  Lumbar spine AP lateral x-rays    Patient was seen and examined with Dr. Charlton Amor.    Kathi Simpers, MD 04/23/2023 2:37 PM   Orthopedic surgery Resident     Evelena Asa, MD  Orthopaedic Spine Surgeon  [facility]    Voice recognition software was used to create this note. Please excuse any typos.      Answers submitted by the patient for this visit:  Back (Submitted on 04/19/2023)  Chief Complaint: Back Pain  What is your goal for today's visit?: Confirm surgery is still place, no issues  Handedness: Right Handed  What  is your pain level?: 2/10  Please describe the quality of your pain: : dull ache, sore, stiffness  What diagnostic workup have you had for this condition?: MRI scan  What treatments have you tried for this condition?: acetaminophen, heat, ice, physical therapy, surgery, TENS  Progression since onset: : rapidly improving  Is this a work related condition? : No  Current work status: : usual activities  Questionnaire about: Back Pain (Submitted on 04/19/2023)  Chief Complaint: Back Pain

## 2023-04-27 ENCOUNTER — Other Ambulatory Visit: Payer: Self-pay | Admitting: Family Medicine

## 2023-04-27 ENCOUNTER — Encounter: Payer: Self-pay | Admitting: Family Medicine

## 2023-04-27 MED ORDER — LAMOTRIGINE 100 MG PO TABS *I*
150.0000 mg | ORAL_TABLET | Freq: Every day | ORAL | 0 refills | Status: DC
Start: 2023-04-27 — End: 2023-05-04

## 2023-04-27 NOTE — Telephone Encounter (Signed)
Last office visit:   04/20/2023  Patients upcoming appointments:  Future Appointments   Date Time Provider Department Center   04/30/2023 10:45 AM Madison Hickman, Britta Mccreedy, MD [facility] None   05/28/2023  1:00 PM Saverio Danker, MD [facility] None   06/14/2023  9:00 AM Sheliah Mends, MD [facility] None     Recent Lab results:  GENERAL CHEMISTRY   No value within the past 365 days   LIPID PROFILE   No value within the past 365 days   LIVER PROFILE   No value within the past 365 days   DIABETES THYROID   Recent Labs     09/23/22  1027   HA1C 5.8*    Recent Labs     11/12/22  1058   TSH 2.08         Pending/Orders Labs:  Lab Frequency Next Occurrence   Echo Complete Once 01/20/2023

## 2023-04-30 ENCOUNTER — Ambulatory Visit: Payer: Commercial Managed Care - PPO | Attending: Psychiatry | Admitting: Psychiatry

## 2023-04-30 DIAGNOSIS — F319 Bipolar disorder, unspecified: Secondary | ICD-10-CM | POA: Insufficient documentation
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
## 2023-05-03 ENCOUNTER — Encounter: Payer: Self-pay | Admitting: Psychiatry

## 2023-05-04 ENCOUNTER — Other Ambulatory Visit: Payer: Self-pay | Admitting: Family Medicine

## 2023-05-04 ENCOUNTER — Encounter: Payer: Self-pay | Admitting: Psychiatry

## 2023-05-04 ENCOUNTER — Encounter: Payer: Self-pay | Admitting: Family Medicine

## 2023-05-04 NOTE — Telephone Encounter (Signed)
Last office visit:   04/20/2023  Patients upcoming appointments:  Future Appointments   Date Time Provider Department Center   05/28/2023  1:00 PM Saverio Danker, MD [facility] None   06/14/2023  9:00 AM Sheliah Mends, MD [facility] None     Recent Lab results:  GENERAL CHEMISTRY   No value within the past 365 days   LIPID PROFILE   No value within the past 365 days   LIVER PROFILE   No value within the past 365 days   DIABETES THYROID   Recent Labs     09/23/22  1027   HA1C 5.8*    Recent Labs     11/12/22  1058   TSH 2.08         Pending/Orders Labs:  Lab Frequency Next Occurrence   Echo Complete Once 01/20/2023

## 2023-05-07 ENCOUNTER — Encounter: Payer: Self-pay | Admitting: Otolaryngology

## 2023-05-12 ENCOUNTER — Telehealth: Payer: Self-pay | Admitting: Otolaryngology

## 2023-05-12 NOTE — Telephone Encounter (Signed)
 Patient is scheduled for a DISE with Dr. Wynn Maudlin at [facility] on 06/11/23. Pre-op/Post-op instructions have been sent to patient via MyChart. Will postpone for nursing follow up.

## 2023-05-20 ENCOUNTER — Other Ambulatory Visit: Payer: Self-pay | Admitting: Family Medicine

## 2023-05-24 NOTE — Telephone Encounter (Signed)
 Last office visit:   04/20/2023  Patients upcoming appointments:  Future Appointments   Date Time Provider Department Center   05/28/2023  1:00 PM Saverio Danker, MD [facility] None   06/14/2023  9:00 AM Sheliah Mends, MD [facility] None     Recent Lab results:  GENERAL CHEMISTRY   No value within the past 365 days   LIPID PROFILE   No value within the past 365 days   LIVER PROFILE   No value within the past 365 days   DIABETES THYROID   Recent Labs     09/23/22  1027   HA1C 5.8*    Recent Labs     11/12/22  1058   TSH 2.08         Pending/Orders Labs:  Lab Frequency Next Occurrence   Echo Complete Once 01/20/2023

## 2023-05-26 ENCOUNTER — Encounter: Payer: Self-pay | Admitting: Family Medicine

## 2023-05-27 ENCOUNTER — Other Ambulatory Visit: Payer: Self-pay

## 2023-05-28 ENCOUNTER — Ambulatory Visit: Payer: Commercial Managed Care - PPO | Attending: Family Medicine | Admitting: Family Medicine

## 2023-05-28 ENCOUNTER — Encounter: Payer: Self-pay | Admitting: Family Medicine

## 2023-05-28 ENCOUNTER — Other Ambulatory Visit: Payer: Self-pay

## 2023-05-28 VITALS — BP 122/82 | HR 76 | Temp 98.3°F | Ht 68.0 in | Wt 139.0 lb

## 2023-05-28 DIAGNOSIS — M79609 Pain in unspecified limb: Secondary | ICD-10-CM | POA: Insufficient documentation

## 2023-05-28 DIAGNOSIS — R202 Paresthesia of skin: Secondary | ICD-10-CM | POA: Insufficient documentation

## 2023-05-28 DIAGNOSIS — F1590 Other stimulant use, unspecified, uncomplicated: Secondary | ICD-10-CM | POA: Insufficient documentation

## 2023-05-28 DIAGNOSIS — F419 Anxiety disorder, unspecified: Secondary | ICD-10-CM | POA: Insufficient documentation

## 2023-05-28 DIAGNOSIS — F909 Attention-deficit hyperactivity disorder, unspecified type: Secondary | ICD-10-CM | POA: Insufficient documentation

## 2023-05-28 DIAGNOSIS — Z7689 Persons encountering health services in other specified circumstances: Secondary | ICD-10-CM | POA: Insufficient documentation

## 2023-05-28 DIAGNOSIS — Z1211 Encounter for screening for malignant neoplasm of colon: Secondary | ICD-10-CM | POA: Insufficient documentation

## 2023-05-28 DIAGNOSIS — R29898 Other symptoms and signs involving the musculoskeletal system: Secondary | ICD-10-CM | POA: Insufficient documentation

## 2023-05-28 MED ORDER — LAMOTRIGINE 100 MG PO TABS *I*
100.0000 mg | ORAL_TABLET | Freq: Every evening | ORAL | 2 refills | Status: DC
Start: 2023-05-28 — End: 2024-05-08

## 2023-05-28 MED ORDER — AMPHETAMINE-DEXTROAMPHETAMINE 10 MG PO CP24 *I*
10.0000 mg | ORAL_CAPSULE | Freq: Every morning | ORAL | 0 refills | Status: DC
Start: 2023-05-28 — End: 2023-07-22

## 2023-05-28 MED ORDER — VALACYCLOVIR HCL 500 MG PO TABS *I*
500.0000 mg | ORAL_TABLET | Freq: Every day | ORAL | 1 refills | Status: DC
Start: 2023-05-28 — End: 2023-12-02

## 2023-05-28 MED ORDER — FAMOTIDINE 40 MG PO TABS *I*
40.0000 mg | ORAL_TABLET | Freq: Every evening | ORAL | 2 refills | Status: DC
Start: 2023-05-28 — End: 2023-08-23

## 2023-05-29 ENCOUNTER — Other Ambulatory Visit: Payer: Self-pay

## 2023-05-30 ENCOUNTER — Encounter: Payer: Self-pay | Admitting: Radiology

## 2023-05-30 ENCOUNTER — Ambulatory Visit
Admission: RE | Admit: 2023-05-30 | Discharge: 2023-05-30 | Disposition: A | Discharge status: Home / Self Care | Payer: Commercial Managed Care - PPO | Source: Ambulatory Visit | Attending: Family Medicine | Admitting: Family Medicine

## 2023-05-30 ENCOUNTER — Other Ambulatory Visit: Payer: Self-pay

## 2023-05-30 DIAGNOSIS — R29898 Other symptoms and signs involving the musculoskeletal system: Secondary | ICD-10-CM | POA: Insufficient documentation

## 2023-05-30 LAB — POCT CREATININE
Creatinine, POCT: 0.9 mg/dL (ref 0.51–0.95)
eGFR BY CREAT: 73 *

## 2023-05-30 MED ORDER — IOHEXOL 350 MG/ML (OMNIPAQUE) IV SOLN *I*
1.0000 mL | Freq: Once | INTRAVENOUS | Status: AC
Start: 2023-05-30 — End: 2023-05-30
  Administered 2023-05-30: 70 mL via INTRAVENOUS
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
## 2023-05-31 ENCOUNTER — Telehealth: Payer: Self-pay

## 2023-05-31 ENCOUNTER — Encounter: Payer: Self-pay | Admitting: Family Medicine

## 2023-05-31 LAB — DRUG SCREEN PANEL, EMERGENCY
Amphetamine,UR: POSITIVE *Unknown
Barbiturate,UR: NEGATIVE *Unknown
Benzodiazepinen,UR: NEGATIVE *Unknown
Cocaine/Metab,UR: NEGATIVE *Unknown
Opiates,UR: NEGATIVE *Unknown
THC Metabolite,UR: NEGATIVE *Unknown

## 2023-05-31 LAB — CONFIRM AMPHET: Confirm Amphet: POSITIVE

## 2023-05-31 NOTE — Telephone Encounter (Signed)
Added to wait list  referral received mychart message sent

## 2023-05-31 NOTE — Telephone Encounter (Signed)
Copied from CRM [id number]. Topic: Appointments - Schedule Appointment  >> May 31, 2023 10:44 AM Bobbie Stack B wrote:  The patient is calling to schedule her colonoscopy per the referral from 05/28/23.     Please callback patient to discuss/schedule. She can be reached at: [phone]

## 2023-06-01 ENCOUNTER — Other Ambulatory Visit: Payer: Self-pay

## 2023-06-01 ENCOUNTER — Ambulatory Visit: Payer: Commercial Managed Care - PPO | Admitting: Family Medicine

## 2023-06-01 VITALS — BP 110/80 | HR 80 | Temp 97.6°F | Ht 68.0 in | Wt 138.0 lb

## 2023-06-01 DIAGNOSIS — R7303 Prediabetes: Secondary | ICD-10-CM

## 2023-06-01 DIAGNOSIS — G4700 Insomnia, unspecified: Secondary | ICD-10-CM

## 2023-06-01 DIAGNOSIS — J349 Unspecified disorder of nose and nasal sinuses: Secondary | ICD-10-CM

## 2023-06-01 DIAGNOSIS — I7090 Unspecified atherosclerosis: Secondary | ICD-10-CM

## 2023-06-01 MED ORDER — MELATONIN 3 MG PO TABS *I*
3.0000 mg | ORAL_TABLET | Freq: Every evening | ORAL | 1 refills | Status: DC | PRN
Start: 2023-06-01 — End: 2024-03-23

## 2023-06-01 NOTE — Telephone Encounter (Signed)
Patient is scheduled for today at 4:20 pm

## 2023-06-01 NOTE — Patient Instructions (Signed)
Magnesium threonate

## 2023-06-02 ENCOUNTER — Ambulatory Visit: Payer: Commercial Managed Care - PPO | Attending: Otolaryngology | Admitting: Otolaryngology

## 2023-06-02 ENCOUNTER — Other Ambulatory Visit
Admission: RE | Admit: 2023-06-02 | Discharge: 2023-06-02 | Disposition: A | Discharge status: Home / Self Care | Payer: Commercial Managed Care - PPO | Source: Ambulatory Visit | Attending: Family Medicine | Admitting: Family Medicine

## 2023-06-02 ENCOUNTER — Encounter: Payer: Self-pay | Admitting: Otolaryngology

## 2023-06-02 ENCOUNTER — Other Ambulatory Visit: Payer: Self-pay

## 2023-06-02 VITALS — BP 144/72 | HR 73 | Temp 98.5°F | Ht 68.0 in | Wt 138.0 lb

## 2023-06-02 DIAGNOSIS — I7090 Unspecified atherosclerosis: Secondary | ICD-10-CM | POA: Insufficient documentation

## 2023-06-02 DIAGNOSIS — G4700 Insomnia, unspecified: Secondary | ICD-10-CM | POA: Insufficient documentation

## 2023-06-02 DIAGNOSIS — R7303 Prediabetes: Secondary | ICD-10-CM | POA: Insufficient documentation

## 2023-06-02 DIAGNOSIS — J341 Cyst and mucocele of nose and nasal sinus: Secondary | ICD-10-CM

## 2023-06-02 LAB — CBC AND DIFFERENTIAL
Baso # K/uL: 0.1 10*3/uL (ref 0.0–0.2)
Eos # K/uL: 0.1 10*3/uL (ref 0.0–0.5)
Hematocrit: 41 % (ref 34–49)
Hemoglobin: 12.8 g/dL (ref 11.2–16.0)
IMM Granulocytes #: 0 10*3/uL (ref 0.0–0.0)
IMM Granulocytes: 0.2 %
Lymph # K/uL: 1.6 10*3/uL (ref 1.0–5.0)
MCV: 93 fL (ref 75–100)
Mono # K/uL: 0.3 10*3/uL (ref 0.1–1.0)
Neut # K/uL: 3.9 10*3/uL (ref 1.5–6.5)
Nucl RBC # K/uL: 0 10*3/uL (ref 0.0–0.0)
Nucl RBC %: 0 /100{WBCs} (ref 0.0–0.2)
Platelets: 277 10*3/uL (ref 150–450)
RBC: 4.5 MIL/uL (ref 4.0–5.5)
RDW: 13 % (ref 0.0–15.0)
Seg Neut %: 64.9 %
WBC: 6.1 10*3/uL (ref 3.5–11.0)

## 2023-06-02 LAB — LIPID PANEL
Chol/HDL Ratio: 2.7
Cholesterol: 210 mg/dL — AB
HDL: 79 mg/dL — ABNORMAL HIGH (ref 40–60)
LDL Calculated: 110 mg/dL
Non HDL Cholesterol: 131 mg/dL
Triglycerides: 106 mg/dL

## 2023-06-02 LAB — COMPREHENSIVE METABOLIC PANEL
ALT: 25 U/L (ref 0–35)
AST: 22 U/L (ref 0–35)
Albumin: 4.7 g/dL (ref 3.5–5.2)
Alk Phos: 56 U/L (ref 35–105)
Anion Gap: 9 (ref 7–16)
Bilirubin,Total: 0.2 mg/dL (ref 0.0–1.2)
CO2: 27 mmol/L (ref 20–28)
Calcium: 9.6 mg/dL (ref 8.6–10.2)
Chloride: 103 mmol/L (ref 96–108)
Creatinine: 0.77 mg/dL (ref 0.51–0.95)
Glucose: 98 mg/dL (ref 60–99)
Lab: 12 mg/dL (ref 6–20)
Potassium: 4.5 mmol/L (ref 3.3–5.1)
Sodium: 139 mmol/L (ref 133–145)
Total Protein: 7.2 g/dL (ref 6.3–7.7)
eGFR BY CREAT: 88 *

## 2023-06-02 LAB — MAGNESIUM: Magnesium: 2.2 mg/dL (ref 1.6–2.5)

## 2023-06-02 NOTE — Progress Notes (Signed)
Patient called the office and writer gave her the information for imaging.

## 2023-06-02 NOTE — Progress Notes (Signed)
Reason for Visit  Incidental CT finding of sinus disease.    HPI  Gail Jensen is a 62 y.o. female who presents for evaluation of incidental finding of sinus disease in her right maxillary sinus. She initially presented to her PCP for right arm pain with radiating numbness, concerning for a stroke. She was then sent for a CT head for evaluation which thankfully was negative for stroke. They did find mucosal disease in the right maxillary sinus which prompted referral for this visit. She denies any symptoms of nasal congestion, rhinorrhea, frequent sinus infections or smell disturbances. She does have crusting in the nose in the morning, she uses vaseline at night and sleeps with a humidifier.         06/02/2023     3:00 PM   Rhinology Scores   NSI Score 2      Nasal Symptom Inventory  Facial or sinus pressure:  Mild   Facial or sinus pain:  None   Headache:  Mild   Nasal congestion:  None   Nasal obstruction:  None   Post-nasal drip:  None   Clear nasal discharge:   None   Discolored nasal discharge:  None   Itchy nose/eyes/throat:  None   Nose bleeds: None   Tiredness:  None   Wheezing:  None   Cough:  None     Allergies  She is allergic to ciprofloxacin, sulfa antibiotics, sulfasalazine, and wheat.    PMH  She has a past medical history of Anxiety, Arthritis, Celiac disease, Depression, and GERD (gastroesophageal reflux disease).    PSH  She has a past surgical history that includes Cesarean Section, Unspecified (1996); Breast enhancement surgery (Bilateral, 11/1998); laminectomy (08/2021); pr arthrodesis combined tq 1ntrspc lumbar (N/A, 03/16/2022); pr reinsertion spinal fixation device (N/A, 04/01/2022); Eye surgery; and Spine surgery (09-05-21, Sept. 2023, Oct. 2024).    FH  Her family history includes Anxiety disorder in her mother; Arthritis in her maternal grandmother; Bladder Cancer in her mother; Cancer in her mother; Depression in her daughter, mother, sister, and son; Diabetes in her maternal  aunt; GERD in her mother; Kidney Disease in her mother; Liver cancer in her mother.    SH  She reports that she has never smoked. She has never used smokeless tobacco. She reports that she does not currently use alcohol after a past usage of about 5.0 standard drinks of alcohol per week. She reports that she does not currently use drugs after having used the following drugs: Other-see comments.     PE  Vitals BP 144/72   Pulse 73   Temp 36.9 C (98.5 F) (Temporal)   Ht 1.727 m (5\' 8" )   Wt 62.6 kg (138 lb)   BMI 20.98 kg/m    General General appearance normal. Alert and cooperative. Voice normal.   Head/Face Normocephalic; atraumatic. No facial skin lesions. Facial strength symmetric.   Eyes Extraocular muscles intact. No nystagmus with lateral gaze.   Ears, Nose, Mouth & Throat Normal EAC. Normal TM appearance.  Weber midline. Clinical speech thresholds normal.   External ears normal. External nose normal. Nasal mucosa normal. Septum midline. Inferior turbinates normal. No polyps. No nasal discharge. No crusting. Lips, teeth, and gums normal. Oral mucosa moist, without lesions. Tongue and FOM without masses. Palate and uvula without lesions and with symmetric elevation. Tonsils symmetric without lesions. Oropharynx clear.   Neck No masses. No palpable cervical adenopathy.   Respiratory Normal respiratory effort. No retractions.   Cardiovascular Upper extremities  well-perfused.   Neuro/Psych CNII-XII grossly normal. Alert and oriented. Mood and affect normal.     Results  CT head (05/30/2023) - Paranasal sinuses grossly clear, there is some inflammation in the floor of right maxillary sinus, likely a mucous retention cyst.     Assessment  Incidental finding of right maxillary sinus floor opacification on CT head for stroke work up, likely mucous retention cyst.     Plan  We personally reviewed the CT head images with the patient.  We discussed that mucous retention cysts in the maxillary sinus is not an  uncommon finding and it is a benign finding. In a systematic review of asymptomatic patients with sinus or head CT imaging spanning 33 articles and almost 17K patients, mucous retention cysts were found in approximately 13% of asymptomatic patients (B Razi et al. doi: 10.1177/01945998211035097 ).  She has no sinonasal symptoms otherwise besides some nasal crusting in the morning. She can trial sinus rinse at night and nasal saline sprays if the crusting becomes bothersome.   Return to clinic on an as-needed basis.    Dorie Rank, MD  Resident Physician  Otolaryngology - Head & Neck Surgery     Attestation  I saw and evaluated the patient with Farrel Gobble, MD (PGY-4). I have reviewed and edited the resident's note and confirm the findings and plan of care as documented above. I personally reviewed the CT images.    Hampton Abbot Anjelina Dung, MD

## 2023-06-03 LAB — HEMOGLOBIN A1C: Hemoglobin A1C: 5.6 %

## 2023-06-04 ENCOUNTER — Encounter: Payer: Self-pay | Admitting: Otolaryngology

## 2023-06-04 NOTE — Progress Notes (Signed)
[facility]  [address]  [place] [address]  Phone: [phone]  Fax: [phone]     Patient ID: Gail Jensen is a 62 y.o. female.    Chief Complaint   Patient presents with    Lab Results     HPI    Patient presented accompanied by her sister who works at [employer].  She would like to review her previous imaging and order blood work for atherosclerosis screening risk factors based on atherosclerosis seen on CT head.      Her sister mentions hyperbaric therapy as a treatment and will look into it more.    Pt used to do cocaine and  wonders if she damaged her nasal mucosa that way (see on CT)  She still drings "at least 2 glasses of wine every night"  and is willing to cut down as she has before.       Medications Reviewed and changes   Current Outpatient Medications   Medication Sig    famotidine (PEPCID) 40 mg tablet Take 1 tablet (40 mg total) by mouth nightly for Gastroesophageal Reflux Disease.    valACYclovir (VALTREX) 500 mg tablet Take 1 tablet (500 mg total) by mouth daily for Herpes Simplex Infection.    lamoTRIgine (LAMICTAL) 100 mg tablet Take 1 tablet (100 mg total) by mouth at bedtime for Manic-Depression.    amphetamine-dextroamphetamine (ADDERALL XR) 10 mg 24 hr capsule Take 1 capsule (10 mg total) by mouth every morning. Max daily dose: 10 mg May sprinkle contents of capsule on food, do not chew capsule.    estradiol 0.05 MG/24HR twice-weekly patch Apply 1 patch onto the skin twice a week.    progesterone (PROMETRIUM) 200 MG capsule Use one po from day 1 to day 15 of the month    traZODone (DESYREL) 150 mg tablet TAKE 1 TABLET BY MOUTH NIGHTLY    bimatoprost (LATISSE) 0.03 % ophthalmic solution Place 1 drop into both eyes once a week  Use on bottom lashes at night.    ibuprofen (ADVIL,MOTRIN) 800 mg tablet Take 1 tablet (800 mg total) by mouth 3 times daily as needed for Pain    clobetasol (TEMOVATE) 0.05 % external solution Apply 1 g topically as needed    lidocaine  (XYLOCAINE) 5 % ointment Apply small amount topically ever 6 hours as needed for hemorrhoid pain    NONFORMULARY, OTHER, ORDER *I* 1 each  CPAP    sertraline (ZOLOFT) 100 mg tablet Take 1 tablet (100 mg total) by mouth daily.    Multiple Vitamin (DAILY-VITE MULTIVITAMIN) TABS Take 1 tablet by mouth daily.    melatonin 3 mg Take 1 tablet (3 mg total) by mouth nightly as needed for Sleep for Trouble Sleeping.           Objective:    BP 110/80   Pulse 80   Temp 36.4 C (97.6 F)   Ht 1.727 m (5\' 8" )   Wt 62.6 kg (138 lb)   SpO2 98%   BMI 20.98 kg/m   Pain    06/01/23 1620   PainSc:   4   PainLoc: Arm      Physical Exam  Vitals and nursing note reviewed.   Constitutional:       Appearance: Normal appearance.   HENT:      Head: Normocephalic and atraumatic.      Nose: Nose normal.      Mouth/Throat:      Mouth: Mucous membranes are moist.  Cardiovascular:      Rate and Rhythm: Normal rate.   Pulmonary:      Effort: Pulmonary effort is normal.   Abdominal:      General: There is no distension.   Musculoskeletal:         General: Normal range of motion.      Cervical back: Normal range of motion.   Skin:     General: Skin is warm and dry.   Neurological:      General: No focal deficit present.      Mental Status: She is alert and oriented to person, place, and time.   Psychiatric:         Mood and Affect: Mood normal.         Behavior: Behavior normal.       Assessment and Plan:  We talked a lot about the whole food plant based diet and Jump start program as the best way to lower cholesterol and athersclerosis naturally but she is willign to get blood work and wants to prevent stroke.     1. Atherosclerosis  - Hemoglobin A1c; Future  - Lipid Panel (Reflex to Direct  LDL if Triglycerides more than 400); Future  - US carotid doppler bilateral; Future  - Lipoprotein A (LPA); Future  - Magnesium; Future    2. Prediabetes  - Hemoglobin A1c; Future  - Lipid Panel (Reflex to Direct  LDL if Triglycerides more than 400);  Future  - Comprehensive metabolic panel; Future  - CBC and differential; Future    3. Insomnia, unspecified type  - Magnesium; Future    4. Sinus disease  - AMB REFERRAL TO [facility]      Orders Placed This Encounter    US carotid doppler bilateral    Hemoglobin A1c    Lipid Panel (Reflex to Direct  LDL if Triglycerides more than 400)    Comprehensive metabolic panel    CBC and differential    Lipoprotein A (LPA)    Magnesium    AMB REFERRAL TO [facility]    melatonin 3 mg     Patient Instructions   Magnesium threonate    Electronically signed by Saverio Danker, MD 06/01/2023 11:15 AM  [facility], Phone: [phone]

## 2023-06-05 LAB — LIPOPROTEIN A (LPA): Lipoprotein a: <6 mg/dL (ref <=29)

## 2023-06-07 ENCOUNTER — Emergency Department: Payer: Commercial Managed Care - PPO

## 2023-06-07 ENCOUNTER — Emergency Department
Admission: EM | Admit: 2023-06-07 | Discharge: 2023-06-07 | Disposition: A | Discharge status: Home / Self Care | Payer: Commercial Managed Care - PPO | Source: Ambulatory Visit | Attending: Emergency Medicine | Admitting: Emergency Medicine

## 2023-06-07 ENCOUNTER — Other Ambulatory Visit: Payer: Self-pay

## 2023-06-07 ENCOUNTER — Emergency Department: Payer: Commercial Managed Care - PPO | Admitting: Radiology

## 2023-06-07 ENCOUNTER — Telehealth: Payer: Self-pay | Admitting: Family Medicine

## 2023-06-07 ENCOUNTER — Encounter: Payer: Self-pay | Admitting: Family Medicine

## 2023-06-07 DIAGNOSIS — M7710 Lateral epicondylitis, unspecified elbow: Secondary | ICD-10-CM

## 2023-06-07 DIAGNOSIS — M7711 Lateral epicondylitis, right elbow: Secondary | ICD-10-CM | POA: Insufficient documentation

## 2023-06-07 DIAGNOSIS — R29818 Other symptoms and signs involving the nervous system: Secondary | ICD-10-CM

## 2023-06-07 DIAGNOSIS — R202 Paresthesia of skin: Secondary | ICD-10-CM | POA: Insufficient documentation

## 2023-06-07 DIAGNOSIS — Z5181 Encounter for therapeutic drug level monitoring: Secondary | ICD-10-CM | POA: Insufficient documentation

## 2023-06-07 DIAGNOSIS — R531 Weakness: Secondary | ICD-10-CM | POA: Insufficient documentation

## 2023-06-07 DIAGNOSIS — R200 Anesthesia of skin: Secondary | ICD-10-CM | POA: Insufficient documentation

## 2023-06-07 DIAGNOSIS — Z79899 Other long term (current) drug therapy: Secondary | ICD-10-CM | POA: Insufficient documentation

## 2023-06-07 LAB — HOLD GREEN WITH GEL

## 2023-06-07 LAB — BASIC METABOLIC PANEL
Anion Gap: 15 (ref 7–16)
CO2: 23 mmol/L (ref 20–28)
Calcium: 9.9 mg/dL (ref 8.6–10.2)
Chloride: 102 mmol/L (ref 96–108)
Creatinine: 0.87 mg/dL (ref 0.51–0.95)
Glucose: 98 mg/dL (ref 60–99)
Lab: 17 mg/dL (ref 6–20)
Potassium: 4.1 mmol/L (ref 3.3–5.1)
Sodium: 140 mmol/L (ref 133–145)
eGFR BY CREAT: 76 *

## 2023-06-07 LAB — PROTIME-INR
INR: 1 (ref 0.9–1.1)
Protime: 10.8 s (ref 10.0–12.9)

## 2023-06-07 LAB — CBC AND DIFFERENTIAL
Baso # K/uL: 0.1 10*3/uL (ref 0.0–0.2)
Eos # K/uL: 0.2 10*3/uL (ref 0.0–0.5)
Hematocrit: 41 % (ref 34–49)
Hemoglobin: 13.4 g/dL (ref 11.2–16.0)
IMM Granulocytes #: 0 10*3/uL (ref 0.0–0.0)
IMM Granulocytes: 0.4 %
Lymph # K/uL: 2.3 10*3/uL (ref 1.0–5.0)
MCV: 90 fL (ref 75–100)
Mono # K/uL: 0.5 10*3/uL (ref 0.1–1.0)
Neut # K/uL: 5.5 10*3/uL (ref 1.5–6.5)
Nucl RBC # K/uL: 0 10*3/uL (ref 0.0–0.0)
Nucl RBC %: 0 /100{WBCs} (ref 0.0–0.2)
Platelets: 251 10*3/uL (ref 150–450)
RBC: 4.6 MIL/uL (ref 4.0–5.5)
RDW: 12.7 % (ref 0.0–15.0)
Seg Neut %: 64.2 %
WBC: 8.5 10*3/uL (ref 3.5–11.0)

## 2023-06-07 LAB — APTT: aPTT: 29.2 s (ref 25.8–37.9)

## 2023-06-07 LAB — BLOOD BANK HOLD PINK

## 2023-06-07 MED ORDER — ACETAMINOPHEN 325 MG PO TABS *I*
975.0000 mg | ORAL_TABLET | Freq: Once | ORAL | Status: AC
Start: 2023-06-07 — End: 2023-06-07
  Administered 2023-06-07: 975 mg via ORAL
  Filled 2023-06-07: qty 3

## 2023-06-07 MED ORDER — LIDOCAINE 4 % EX PATCH *I*
1.0000 | MEDICATED_PATCH | Freq: Once | CUTANEOUS | Status: DC
Start: 2023-06-07 — End: 2023-06-07
  Administered 2023-06-07: 1 via TRANSDERMAL
  Filled 2023-06-07: qty 1

## 2023-06-07 NOTE — Provider Consult (Cosign Needed)
Department of Neurosurgery  Stroke Consultation Note           Reason for Consult:  Ischemic stroke On-Call Attending:  Lorelee Cover, MD, MSc        History of Present Illness (HPI)      Gail Jensen is a 62 y.o. female with history of ADHD, Bipolar Disorder, Lumbar Radiculopathy, L4-5 Radiculopathy, Celiac Disease and GERD, presenting as an acute ischemic stroke with complaints of RUE numbness/tingling/weakness for past 6 weeks. Hand will also get colder than the other. It will last a couple minutes for up to 30 min. Today the numbness and pain was worse so this prompted her to be seen. She was seen in triage where stroke alert was called.     The patient currently takes no medication.    Pre-stroke modified rankin scale:  0 - No symptoms    Stroke Time Documentation     Last Known Well Time:  1000  Symptom Discovery Time:  1000  Neurosurgery Page Time:  1435  Patient Seen:  1445-Seen on arrival to CT scanner    Additional History     Patient's past medical, surgical, social, and family history was reviewed.  Patient's allergies were reviewed.  Please see eRecord for full details.    No current facility-administered medications on file prior to encounter.     Current Outpatient Medications on File Prior to Encounter   Medication Sig Dispense Refill    melatonin 3 mg Take 1 tablet (3 mg total) by mouth nightly as needed for Sleep for Trouble Sleeping. 30 tablet 1    famotidine (PEPCID) 40 mg tablet Take 1 tablet (40 mg total) by mouth nightly for Gastroesophageal Reflux Disease. 30 tablet 2    valACYclovir (VALTREX) 500 mg tablet Take 1 tablet (500 mg total) by mouth daily for Herpes Simplex Infection. 60 tablet 1    lamoTRIgine (LAMICTAL) 100 mg tablet Take 1 tablet (100 mg total) by mouth at bedtime for Manic-Depression. 45 tablet 2    amphetamine-dextroamphetamine (ADDERALL XR) 10 mg 24 hr capsule Take 1 capsule (10 mg total) by mouth every morning. Max daily dose: 10 mg May sprinkle contents  of capsule on food, do not chew capsule. 30 capsule 0    estradiol 0.05 MG/24HR twice-weekly patch Apply 1 patch onto the skin twice a week. 8 patch 2    progesterone (PROMETRIUM) 200 MG capsule Use one po from day 1 to day 15 of the month 45 capsule 1    traZODone (DESYREL) 150 mg tablet TAKE 1 TABLET BY MOUTH NIGHTLY 90 tablet 1    bimatoprost (LATISSE) 0.03 % ophthalmic solution Place 1 drop into both eyes once a week  Use on bottom lashes at night. 3 mL 1    ibuprofen (ADVIL,MOTRIN) 800 mg tablet Take 1 tablet (800 mg total) by mouth 3 times daily as needed for Pain 90 tablet 1    clobetasol (TEMOVATE) 0.05 % external solution Apply 1 g topically as needed      lidocaine (XYLOCAINE) 5 % ointment Apply small amount topically ever 6 hours as needed for hemorrhoid pain      NONFORMULARY, OTHER, ORDER *I* 1 each  CPAP      sertraline (ZOLOFT) 100 mg tablet Take 1 tablet (100 mg total) by mouth daily.      Multiple Vitamin (DAILY-VITE MULTIVITAMIN) TABS Take 1 tablet by mouth daily.       Physical Examination     General Physical Examination  General  appearance:  Alert and appears stated age  Head:  Normocephalic, atraumatic  Lungs:  Non-labored respirations bilaterally  Heart:  Regular rate and rhythm  Pulses:  2+ and symmetric    Neurological Examination  Mental status:  Awake, verbal, following commands    NIH Stroke Scale     NIHSS Performed:  Date: 06/07/23  Time: 1450  NIH Stroke Scale:  Level of Consciousness (1a): 0 - Alert/Normal  LOC Questions (1b): 0 - Answers both questions correctly  LOC Commands (1c): 0 - Performs both tasks correctly  Best Gaze (2): 0 - Normal  Visual (3): 0 - No visual loss  Facial Palsy (4): 0 - Normal symmetrical movements  Motor Arm, Left (5a): 0 - Normal (extends arm 90 degrees for 10 seconds without drift)  Motor Arm, Right (5b): 0 - Normal (extends arm 90 degrees for 10 seconds without drift)  Motor Leg, Left (6a): 0 - Normal holds leg in 30 degree position for full 5 secs  without drift  Motor Leg, Right (6b): 0 - Normal holds leg in 30 degree position for full 5 secs without drift  Limb Ataxia (7): 0 - Absent  Sensory (8): 1 - Mild-to-moderate decrease in sensation due to stroke  Best Language (9): 0 - No aphasia,  Normal  Dysarthria (10): 0 - Normal Articulation  Extinction & Inattention (11): 0 - Normal/no neglect/absent  Total: 1    Lab Results     No results for input(s): "WBC", "HCT", "PLT", "NA", "K", "UN", "CREAT", "GLU", "PTI", "INR", "PTT" in the last 72 hours.    Imaging Studies     Noncontrast CT head: No acute findings  CT angio head and neck:  Deferred in setting of non stroke diagnosis    ASPECTS:  Area Abnormal hypodensity?   caudate No   insular ribbon No   internal capsule No   lentiform nucleus No   M1 - anterior MCA cortex No   M2 - MCA cortex lateral to the insular ribbon No   M3 - posterior MCA cortex No   M4 - anterior MCA superior territory No   M5 - lateral MCA superior territory No   M6 - posterior MCA superior territory No       TOTAL: (10 - #yes) 10          Assessment     62 y.o. female with a history of ADHD, Bipolar Disorder, Lumbar Radiculopathy, L4-5 Radiculopathy, Celiac Disease and GERD, presenting as an acute stroke alert with NIHSS of 1 for RUE decreased sensation.  Initial CT imaging demonstrates no acute findings. CTA deferred in setting of non stroke diagnosis.   Not on antiplatelets/anticoagulation.  Systemic thrombolytic therapy was not given.      EVT Decision Making     This case was discussed with Neurovascular Chief Resident/Fellow on 06/07/23 at  1454.    EVT pursued at this hospital?: No  Clinical Reason(s): * Non-stroke diagnosis          Plan     -CT negative    -CTA deferred in setting of non stroke diagnosis    -Patient symptoms have been on going for the past 6 weeks, intermittent in nature, and associated with pain and coolness to hand. Stroke is an unlikely diagnosis. Would work up other causes of her symptoms.     -No surgical  intervention warranted. Please recall for any questions/concerns.     Author:  Buelah Manis, NP on 06/07/2023 as of 2:54 PM

## 2023-06-07 NOTE — Progress Notes (Signed)
SW responded to stroke alert. Pt reports that her sister Marcelino Duster is aware that she is at [facility]. No SW needs at this time.     Celesta Aver, LMSW  ED Social Worker  [phone] or [phone]

## 2023-06-07 NOTE — ED Notes (Signed)
06/07/23 1436   Expected Patient   Date of notification 06/07/23   Time Notified 1433   Notified by Other (comment)   ED Service Adult Call-in   Pt Info note/Reason for sending Per Adam from Triage, LKW 1000, RUE Weakness and numbness,   Alert Stroke/CVA   Pre-Hospital [place] Stroke Scale Positive   Stroke symptoms RUE Weakness/numbness   Date 06/07/23   Time 1000   Stroke Alert called Yes   Stroke alert date 06/07/23   Stroke alert time 1434  (text 1435)   Was TPA given? No   Expected Call-In Information   Requested Evaluation By Adult ED, Stroke Team   Notify Adult ED, Stroke Team   Report called to Adult ED, call in note

## 2023-06-07 NOTE — ED Triage Notes (Addendum)
Pt reports RUE pain, numbness, and discoloration that started at 1000, states that she attempted to answer a phone at work and noted that she was have difficulty moving her arm. Extremity cool to touch in triage but strong pulses palpated. Pt reports decreased sensation to area. Denies any vision changes, HA, or gait difficulties. Denies injury/trauma to area. Hx of atherosclerosis, has carotid U/S ordered for later this month. No hx of CVA or TIA, not anticoagulated. D. Spivey MD In triage to evaluate and stroke alert called.       Prehospital medications given: No

## 2023-06-07 NOTE — ED Provider Progress Notes (Signed)
Hx of anxiety, MDD, BP1. Here for stroke alert. Rt upper ext forearm pain and numbness at 11am. Some pallor in the distal extremity per patient. Sxs ongoing for months. Neuro has seen her. Strength in rt UE 4/5.  [ ]  Neuro eval  [ ]  CTH without  [ ]  Reassess arm for ?DVT     Glenda Chroman, MD  Resident  06/07/23 [phone]

## 2023-06-07 NOTE — ED Notes (Signed)
Patient safe to discharge home.  Patient CAOx4. Patient dressed appropriately. Patient given discharge instructions and verbalizes understanding.  Patient ambulatory.  Patient IV and telemetry removed.

## 2023-06-07 NOTE — First Provider Contact (Signed)
ED First Provider Contact Note    Initial provider evaluation performed by me on 12/16 @ 0226    Vital signs reviewed.    Assessment: Patient is a 62 year old female with a past medical history significant for anxiety, depression who presents to the ED with concerns for weakness and numbness in her right upper extremity.  She states that she feels as though it may be cool to the touch.  She states that the symptoms started around 10 AM.  She denies any recent trauma.  She has 2+ radial pulse.  She has decreased sensation over the ulnar nerve distribution of her hand and roughly 3 out of 5 strength on the right compared to 5 out of 5 on the left.  Stroke alert called at this time.    Orders placed:  CT     Patient requires further evaluation.     Pete Pelt, MD, 06/07/2023, 2:34 PM     Pete Pelt, MD  Resident  06/07/23 [phone]

## 2023-06-07 NOTE — Telephone Encounter (Signed)
Patient called very concerned with the symptoms she is experiencing today.      - right arm is numb , trouble writing with her right hand  - forearm painful  - fingers turning purple    Patient is very worried and was requesting to speak with Provider but it was Provider's lunch.      Please advise.

## 2023-06-07 NOTE — Discharge Instructions (Addendum)
You are seen in the emergency department today for lower arm numbness.  You were seen by a physician and lab work and imaging was completed.  This was all reassuring.  Additionally you are seen by neurology specialist which was reassuring.    It is likely that you are experiencing some numbness and tingling as well as pain from the lateral  epicondylitis.  You can further discuss this with your physical therapist.  Additionally I recommend that you try taking Tylenol daily for a week and rest as able.      What can be done to prevent this health problem?   Stay active and work out to keep your muscles strong and flexible.  Warm up slowly and stretch before you exercise. Use good training techniques and form for sports. Have an expert look at your technique.  When working out with weights, try to keep your elbow slightly bent instead of straightened all the way. Avoid lifting objects when your elbow is fully straightened or your palm is down.  Take breaks often when doing things that use repeat movements.  Take extra care when playing racquet sports. Ask an expert for help when choosing equipment.  Use a two-handed backhand instead of a one-handed backhand. This puts less stress on your arm.  Racquets with smaller head sizes, a stiffer frame, and looser strings put less stress on your arm.  Keep a healthy weight so there is not extra stress on your joints. Eat a healthy diet to keep your muscles healthy.  When do I need to call the doctor?   You have numbness or tingling in your hands or fingers  You are not feeling better in 2 or 3 days or you are feeling worse

## 2023-06-07 NOTE — ED Provider Notes (Signed)
History     Chief Complaint   Patient presents with    Weakness    Numbness     62 year old female with prior medical history of anxiety, depression, arthritis, celiac disease, bipolar disorder presents to the emergency department due to right upper extremity pain, numbness and perceived discoloration.  Patient was called a stroke alert at time of triage.  Patient reports that she was at work when she started experiencing pain and numbness at around 11:00 in her forearm. She states that it progressively worsened until she tried to answer the phone and had difficulty moving her arm so she decided to come in for further evaluation.     On further evaluation, patient reports that she has been having some of this discomfort ongoing for few months.      Medical/Surgical/Family History     Past Medical History:   Diagnosis Date    Anxiety     Arthritis     Celiac disease     Depression     GERD (gastroesophageal reflux disease)         Patient Active Problem List   Diagnosis Code    Lumbar radiculopathy M54.16    Attention deficit hyperactivity disorder (ADHD) F90.9    Celiac disease K90.0    Gastroesophageal reflux disease K21.9    Psoriasis of scalp L40.9    Bipolar disorder F31.9            Past Surgical History:   Procedure Laterality Date    BREAST ENHANCEMENT SURGERY Bilateral 11/1998    CESAREAN SECTION, UNSPECIFIED  1996    EYE SURGERY      Cataract surgery 9.13.24    LAMINECTOMY  08/2021    PR ARTHRODESIS COMBINED TQ 1NTRSPC LUMBAR N/A 03/16/2022    Procedure: L4-5 TLIF;  Surgeon: Evelena Asa, MD;  Location: [facility] MAIN OR;  Service: Orthopedics    PR REINSERTION SPINAL FIXATION DEVICE N/A 04/01/2022    Procedure: Revision of right L4 screw, Globus (120 min);  Surgeon: Evelena Asa, MD;  Location: [facility] MAIN OR;  Service: Orthopedics    SPINE SURGERY  09-05-21, Sept. 2023, Oct. 2024          Social History     Tobacco Use    Smoking status: Never    Smokeless tobacco: Never   Substance Use Topics    Alcohol  use: Not Currently     Alcohol/week: 5.0 standard drinks of alcohol     Comment: Stopped drinking 02/19/23    Drug use: Not Currently     Types: Other-see comments     Comment: Sleep gummies             Review of Systems    Physical Exam     Triage Vitals  Triage Start: Start, (06/07/23 1426)  First Recorded BP: 163/84, Resp: 18, Temp: 36.6 C (97.9 F), Temp src: TEMPORAL Oxygen Therapy SpO2: 100 %, O2 Device: None (Room air), Heart Rate: 80, (06/07/23 1400)  .      Physical Exam  Constitutional:       General: She is not in acute distress.     Appearance: Normal appearance.   HENT:      Head: Normocephalic and atraumatic.      Nose: Nose normal. No congestion or rhinorrhea.      Mouth/Throat:      Mouth: Mucous membranes are moist.   Eyes:      Extraocular Movements: Extraocular movements intact.   Cardiovascular:  Rate and Rhythm: Normal rate and regular rhythm.      Heart sounds: No murmur heard.     No friction rub. No gallop.   Pulmonary:      Effort: Pulmonary effort is normal. No respiratory distress.      Breath sounds: No wheezing or rales.   Abdominal:      General: Abdomen is flat. Bowel sounds are normal. There is no distension.      Palpations: Abdomen is soft.      Tenderness: There is no abdominal tenderness.   Musculoskeletal:         General: No swelling. Normal range of motion.      Right elbow: No swelling or deformity. Tenderness present in lateral epicondyle.      Right forearm: Tenderness present.      Cervical back: Normal range of motion.   Skin:     General: Skin is warm and dry.      Capillary Refill: Capillary refill takes less than 2 seconds.   Neurological:      Mental Status: She is alert and oriented to person, place, and time.      Motor: Weakness (4/5 with hand grip strength) present.      Comments:   NIH Stroke Scale    1a  Level of consciousness: 0=alert; keenly responsive  1b. LOC questions:  0=Performs both tasks correctly  1c. LOC commands: 0=Performs both tasks  correctly  2.  Best Gaze: 0=normal  3. Visual: 0=No visual loss  4. Facial Palsy: 0=Normal symmetric movement  5a. Motor left arm: 0=No drift, limb holds 90 (or 45) degrees for full 10 seconds  5b.  Motor right arm: 0=No drift, limb holds 90 (or 45) degrees for full 10 seconds  6a. motor left leg: 0=No drift, limb holds 90 (or 45) degrees for full 10 seconds  6b  Motor right leg:  0=No drift, limb holds 90 (or 45) degrees for full 10 seconds  7. Limb Ataxia: 0=Absent  8.  Sensory: 1=Mild to moderate sensory loss; patient feels pinprick is less sharp or is dull on the affected side; there is a loss of superficial pain with pinprick but patient is aware She is being touched  9. Best Language:  0=No aphasia, normal  10. Dysarthria: 0=Normal  11. Extinction and Inattention: 0=No abnormality       Total:   1        Psychiatric:         Mood and Affect: Mood normal.         Behavior: Behavior normal.         Medical Decision Making     Assessment:  61 year old female with prior medical history as mentioned above presents emergency department  and was called a stroke alert at time of triage due to a right upper extremity numbness/weakness.  At time of assessment patient is in no acute distress.  Patient's vitals are notable for borderline hypertension otherwise normal.  At this time it is possible patient could be experiencing an ICH or CVA-patient seen in coordination with stroke team and taken for stroke imaging and labs.  Patient does have point tenderness to the lateral upper condyle and most of the numbness and weakness appears distal to this prompting consideration that this pain could be due to a lateral epicondylitis/radicular pain.  Will trial Tylenol and lidocaine patch.    Differential diagnosis:  ICH, CVA, TIA, lateral epicondylitis, radicular pain, MSK pain, carpal tunnel syndrome  Plan:  Orders Placed This Encounter      CT head without contrast for stroke      CBC and differential      Protime-INR       APTT      Basic metabolic panel      Hold green with gel      Blood bank hold pink      ECG - Adult ED ([place])      acetaminophen (TYLENOL) tablet 975 mg      Lidocaine 4 % patch 1 patch      Independent interpretation of imaging: On my read, CT head with no clear evidence of ICH or ischemia    ED Course and Disposition:  Patient has remained well while in the emergency department.  Patient was seen and evaluated by neurology team with reassuring exam.  Patient was given Tylenol and lidocaine patch to help with pain related to likely lateral epicondylitis.  At this time, patient discharged with follow-up instructions and return precautions provided.           Gust Rung, DO            Gust Rung, DO  Resident  06/07/23 [phone]

## 2023-06-07 NOTE — Telephone Encounter (Signed)
Writer called patient and advised of message from Provider.  Patient verbalized understanding and she will be proceeding to her nearest ED.

## 2023-06-07 NOTE — Progress Notes (Unsigned)
Patient: Gail Jensen   DOB: 04/05/1961   MRN: [id number]    Date: 06/08/2023       Chief Complaint  RUL paresthesias      Subjective    History of Present Illness   The patient has history of  They report  They describe  Symptoms are worse with  Symptoms improve with  They deny numbness, tingling, weakness, bowel and bladder problems.  -At baseline there is DEFICIT.  -There is history of COMORBIDITIES.    Prior Care   -05/28/2023 PCP: Seen for multiple issues including numbness and tingling for about 2 weeks.  Patient reports progressive weakness and numbness in right arm, particularly in the hand. Symptoms include difficulty gripping objects, carrying items, and cold sensation. Tingling 2 cold sensation started at elbow and progressed to hand. No neck or upper arm involvement reported. Symptoms suggestive of possible cervical radiculopathy or ulnar neuropathy.  Physical exam reveals weak grip strength in the right hand. A/P included Sharp, paresthesia to elbow and forearm with palpitation of area superficial to right enlarged nerve. Plans include MRI of the cervical spine to evaluate for c7-c8 dermatome nerve impingement, CT head to rule out old stroke, trial of famotidine for GERD, adjustment of Adderall dosage, and continuation of current medications for anxiety and depression.   -06/07/23 ED: presents to the emergency department due to right upper extremity pain, numbness and perceived discoloration.  Patient was called a stroke alert at time of triage.  Patient reports that she was at work when she started experiencing pain and numbness at around 11:00 in her forearm. She states that it progressively worsened until she tried to answer the phone and had difficulty moving her arm so she decided to come in for further evaluation.  On further evaluation, patient reports that she has been having some of this discomfort ongoing for few months.  Was called a stroke alert at time of triage due to a right upper  extremity numbness/weakness. At time of assessment patient is in no acute distress. Patient's vitals are notable for borderline hypertension otherwise normal. At this time it is possible patient could be experiencing an ICH or CVA-patient seen in coordination with stroke team and taken for stroke imaging and labs. Patient does have point tenderness to the lateral upper condyle and most of the numbness and weakness appears distal to this prompting consideration that this pain could be due to a lateral epicondylitis/radicular pain. Will trial Tylenol and lidocaine patch. A/P included Patient has remained well while in the emergency department. Patient was seen and evaluated by neurology team with reassuring exam. Patient was given Tylenol and lidocaine patch to help with pain related to likely lateral epicondylitis. At this time, patient discharged with follow-up instructions and return precautions provided.   -06/07/23 Neuro: intermittent right arm numbness, tingling, weakness, pain that has been ongoing for the last 6 weeks.  Case was initially called as a stroke alert because she reported that her symptoms started at 1000 today. On further clarification, she reports that the symptoms started 6 weeks ago but have been worsening since then. She describes the onset of pain over her right upper forearm that radiates down her forearm to her right 2nd-3rd fingers, accompanied by numbness, tingling, and weakness. In recent weeks, she has felt that her forearm and hand become cold and change colors (purple, blue).   -06/07/23 NSGY: -Patient symptoms have been on going for the past 6 weeks, intermittent in nature, and associated with pain and  coolness to hand. Stroke is an unlikely diagnosis. Would work up other causes of her symptoms.  No surgical intervention warranted. Please recall for any questions/concerns.     Function  -06/08/2023   -06/08/2023      Behavioral Health  -    Patient Reported Outcomes  06/08/2023  .[email or web address]  I reviewed the patient reported outcomes.  Depression screening came back normal with a score of 54.  Pain Interferences creening came back moderate with a score of 64. Physical Function screening came back moderate with a score of 31.     Conservative Treatment   {LCList - Prior Tx, conservative:46890}    Medication Management   {LCList - Prior Tx, medications:46888}  Topical lidocaine  Ibuprofen  Sertraline  Lamotrigine  Melatonin  Trazodone    Interventional Procedures  None    Relevant Surgeries  None          Objective    Medical History     Past Medical History:   Diagnosis Date    Anxiety     Arthritis     Celiac disease     Depression     GERD (gastroesophageal reflux disease)       Patient Active Problem List   Diagnosis Code    Lumbar radiculopathy M54.16    Attention deficit hyperactivity disorder (ADHD) F90.9    Celiac disease K90.0    Gastroesophageal reflux disease K21.9    Psoriasis of scalp L40.9    Bipolar disorder F31.9   Insomnia       Past Surgical History:   Procedure Laterality Date    BREAST ENHANCEMENT SURGERY Bilateral 11/1998    CESAREAN SECTION, UNSPECIFIED  1996    EYE SURGERY      Cataract surgery 9.13.24    LAMINECTOMY  08/2021    PR ARTHRODESIS COMBINED TQ 1NTRSPC LUMBAR N/A 03/16/2022    Procedure: L4-5 TLIF;  Surgeon: Evelena Asa, MD;  Location: [facility] MAIN OR;  Service: Orthopedics    PR REINSERTION SPINAL FIXATION DEVICE N/A 04/01/2022    Procedure: Revision of right L4 screw, Globus (120 min);  Surgeon: Evelena Asa, MD;  Location: [facility] MAIN OR;  Service: Orthopedics    SPINE SURGERY  09-05-21, Sept. 2023, Oct. 2024       Social History     Socioeconomic History    Marital status: Divorced   Tobacco Use    Smoking status: Never    Smokeless tobacco: Never   Substance and Sexual Activity    Alcohol use: Not Currently     Alcohol/week: 5.0 standard drinks of alcohol     Comment: Stopped drinking 02/19/23    Drug use: Not Currently     Types: Other-see comments      Comment: Sleep gummies    Sexual activity: Not Currently     Partners: Male     Comment: N/a     Assistive Device: {LCLIST - AD (assistive device):46893}      Occupational History  Work Status: {LCLIST - Occupational ZH:08657}  Job duties include: {LCLIST - Occupational Hx, job [phone]      Family History   family history includes Anxiety disorder in her mother; Arthritis in her maternal grandmother; Bladder Cancer in her mother; Cancer in her mother; Depression in her daughter, mother, sister, and son; Diabetes in her maternal aunt; GERD in her mother; Kidney Disease in her mother; Liver cancer in her mother.       Allergies   Allergies   Allergen Reactions  Ciprofloxacin Hives    Sulfa Antibiotics Hives     hives    Sulfasalazine Other (See Comments)     hives    Wheat Other (See Comments)     History of adverse reaction to anesthetics: {LCLIST - No / Yes (no default):46900::"no"}  History of adverse reaction to contrast: {LCLIST - No / Yes (no default):46900::"no"}  History of adverse reaction to steroids: {LCLIST - No / Yes (no default):46900::"no"}      Current Medications  Current Outpatient Medications on File Prior to Visit   Medication Sig Dispense Refill    melatonin 3 mg Take 1 tablet (3 mg total) by mouth nightly as needed for Sleep for Trouble Sleeping. 30 tablet 1    famotidine (PEPCID) 40 mg tablet Take 1 tablet (40 mg total) by mouth nightly for Gastroesophageal Reflux Disease. 30 tablet 2    valACYclovir (VALTREX) 500 mg tablet Take 1 tablet (500 mg total) by mouth daily for Herpes Simplex Infection. 60 tablet 1    lamoTRIgine (LAMICTAL) 100 mg tablet Take 1 tablet (100 mg total) by mouth at bedtime for Manic-Depression. 45 tablet 2    amphetamine-dextroamphetamine (ADDERALL XR) 10 mg 24 hr capsule Take 1 capsule (10 mg total) by mouth every morning. Max daily dose: 10 mg May sprinkle contents of capsule on food, do not chew capsule. 30 capsule 0    estradiol 0.05 MG/24HR twice-weekly patch  Apply 1 patch onto the skin twice a week. 8 patch 2    progesterone (PROMETRIUM) 200 MG capsule Use one po from day 1 to day 15 of the month 45 capsule 1    traZODone (DESYREL) 150 mg tablet TAKE 1 TABLET BY MOUTH NIGHTLY 90 tablet 1    bimatoprost (LATISSE) 0.03 % ophthalmic solution Place 1 drop into both eyes once a week  Use on bottom lashes at night. 3 mL 1    ibuprofen (ADVIL,MOTRIN) 800 mg tablet Take 1 tablet (800 mg total) by mouth 3 times daily as needed for Pain 90 tablet 1    clobetasol (TEMOVATE) 0.05 % external solution Apply 1 g topically as needed      lidocaine (XYLOCAINE) 5 % ointment Apply small amount topically ever 6 hours as needed for hemorrhoid pain      NONFORMULARY, OTHER, ORDER *I* 1 each  CPAP      sertraline (ZOLOFT) 100 mg tablet Take 1 tablet (100 mg total) by mouth daily.      Multiple Vitamin (DAILY-VITE MULTIVITAMIN) TABS Take 1 tablet by mouth daily.       Current Facility-Administered Medications on File Prior to Visit   Medication Dose Route Frequency Provider Last Rate Last Admin    Lidocaine 4 % patch 1 patch  1 patch Transdermal Once Druetto, Kaila, DO   1 patch at 06/07/23 1547     AC/AP use (excluding ASA 81): {LCList - No / Yes Meds:47139}    Opioid use: {LCList - No / Yes Meds:47139}    Benzodiazepine use: {LCList - No / Yes Meds:47139}   ***    Physical Exam   06/08/2023   Insert       Imaging   >>> 06/07/2023 CT head without contrast:  No evidence of acute transcortical ischemic infarct or hemorrhage.        Diagnostic Tests   None            Assessment   62 y.o. female with  -Function is affected  -Treatment as per HPI including  -  Imaging demonstrates  -Exam demonstrates  -Symptoms are     Comorbidities:  -Anxiety, depression, bipolar, insomnia, ADHD  -GERD  -Celiac disease, psoriasis  -S/p lumbar spine surgery (L4-5 TLIF Dr. Charlton Amor 2023 with revision of right L4 screw 2023)      Plan   -  -Follow-up 6 weeks at which point can consider the role for additional  conservative measures, medication management, and/or interventional procedure.    Notes Reviewed Include:  -PCP, ED, Neuro, NSGY

## 2023-06-07 NOTE — Provider Consult (Signed)
Neurology Stroke Consult/Admission Note     Referring Provider/Service:  ED     Reason for Consult:  Stroke     Stroke Documentation     Last Known Well (LKW):  04/26/2023     Discovery of Symptoms (DOS):  04/26/2023     Stroke Team Activated (paged):  06/07/2023  2:36 PM  Stroke Team Arrived:  06/07/2023  2:43 PM    History of Present Illness (HPI)     Gail Jensen is a 62 y.o. female with OSA, celiac disease, ADHD, bipolar disorder, GERD, who presented 06/07/2023 with intermittent right arm numbness, tingling, weakness, pain that has been ongoing for the last 6 weeks.    Case was initially called as a stroke alert because she reported that her symptoms started at 1000 today. On further clarification, she reports that the symptoms started 6 weeks ago but have been worsening since then. She describes the onset of pain over her right upper forearm that radiates down her forearm to her right 2nd-3rd fingers, accompanied by numbness, tingling, and weakness. In recent weeks, she has felt that her forearm and hand become cold and change colors (purple, blue) when the pain is more severe. When her upper forearm is palpated, the pain get worse. The pain also gets worse if she moves her right thumb. These episodes last for minutes at a time and occur daily. They tend to come on when she is using her right hand. She is right-handed. No associated headache, other focal weakness, vision changes, or other focal deficits are associated with this. She denies any preceding injury, change in medications, illness, or other events preceding these episodes. She denies a history of playing sports such as squash or tennis.    She has been seeing her PCP for these complaints. She had a CT head that showed intracranial atherosclerosis. She had a carotid Doppler ultrasound ordered for further workup. She was also referred to physical therapy and has her first appointment scheduled for tomorrow.    The patient is currently on no  antithrombotic medications.    Pre-stroke modified rankin scale: 1 - No significant disability. Able to carry out all usual activities, despite some symptoms.    Additional History     Patient's past medical, surgical, social, and family history was reviewed.  Patient's allergies were reviewed.  Please see eRecord for full details.    No current facility-administered medications on file prior to encounter.     Current Outpatient Medications on File Prior to Encounter   Medication Sig Dispense Refill    melatonin 3 mg Take 1 tablet (3 mg total) by mouth nightly as needed for Sleep for Trouble Sleeping. 30 tablet 1    famotidine (PEPCID) 40 mg tablet Take 1 tablet (40 mg total) by mouth nightly for Gastroesophageal Reflux Disease. 30 tablet 2    valACYclovir (VALTREX) 500 mg tablet Take 1 tablet (500 mg total) by mouth daily for Herpes Simplex Infection. 60 tablet 1    lamoTRIgine (LAMICTAL) 100 mg tablet Take 1 tablet (100 mg total) by mouth at bedtime for Manic-Depression. 45 tablet 2    amphetamine-dextroamphetamine (ADDERALL XR) 10 mg 24 hr capsule Take 1 capsule (10 mg total) by mouth every morning. Max daily dose: 10 mg May sprinkle contents of capsule on food, do not chew capsule. 30 capsule 0    estradiol 0.05 MG/24HR twice-weekly patch Apply 1 patch onto the skin twice a week. 8 patch 2    progesterone (PROMETRIUM) 200 MG  capsule Use one po from day 1 to day 15 of the month 45 capsule 1    traZODone (DESYREL) 150 mg tablet TAKE 1 TABLET BY MOUTH NIGHTLY 90 tablet 1    bimatoprost (LATISSE) 0.03 % ophthalmic solution Place 1 drop into both eyes once a week  Use on bottom lashes at night. 3 mL 1    ibuprofen (ADVIL,MOTRIN) 800 mg tablet Take 1 tablet (800 mg total) by mouth 3 times daily as needed for Pain 90 tablet 1    clobetasol (TEMOVATE) 0.05 % external solution Apply 1 g topically as needed      lidocaine (XYLOCAINE) 5 % ointment Apply small amount topically ever 6 hours as needed for hemorrhoid pain       NONFORMULARY, OTHER, ORDER *I* 1 each  CPAP      sertraline (ZOLOFT) 100 mg tablet Take 1 tablet (100 mg total) by mouth daily.      Multiple Vitamin (DAILY-VITE MULTIVITAMIN) TABS Take 1 tablet by mouth daily.       Physical Examination     Temp:  [36.6 C (97.9 F)] 36.6 C (97.9 F)  Heart Rate:  [80] 80  Resp:  [18] 18  BP: (163)/(84) 163/84    General:  No acute distress  Cardiac: Regular rate and rhythm, no m/r/g  Pulmonary: Clear breath sounds bilaterally, normal WOB    Neurological Examination     Mental Status:  Awake and alert; oriented to age and month; fluent; naming, repetition, and comprehension intact; no evidence of neglect or apraxia; normal affect  Cranial Nerves:  II (R/L):  Pupils constrict from 3/3 to 2/2 to light; visual fields were full to confrontation  III/IV/VI:  Versions were intact without nystagmus; no gaze preference; no ptosis  V:  Facial sensation was symmetric to light touch  VII:  Facial expression was symmetric  VIII:  Hearing was intact to voice  IX/X:  Palate elevated symmetrically; no evidence of dysarthria or dysphonia  XI:  Shoulder shrug appeared symmetric  XII:  Tongue protruded midline  Motor: 4 out of 5 strength with right elbow flexion and extension, right wrist flexion extension, handgrip, and intrinsic muscles of right hand; however, this is secondary to pain in the right distal forearm with examiner resistance. 5 out of 5 strength with right shoulder abduction. 5 out of 5 strength in remainder of extremities. No pronator drift.  Sensory: Endorses decreased sensation to light touch over dorsal aspect of right forearm and hand and fingers 2 through 5. Sensation to pinprick intact. Pain to palpation over proximal lateral forearm near elbow. No evidence of tactile extinction to DSS.  Coordination:  Finger to nose and heel to shin testing were intact bilaterally; no evidence of ataxia or dysmetria  Reflexes (R/L):  Deferred  Gait:  Deferred    NIH Stroke Scale     NIHSS  Performed:  Date: 06/07/23  Time: 1447  NIH Stroke Scale:  Interval: At Admission (Initial)  Level of Consciousness (1a): 0 - Alert/Normal  LOC Questions (1b): 0 - Answers both questions correctly  LOC Commands (1c): 0 - Performs both tasks correctly  Best Gaze (2): 0 - Normal  Visual (3): 0 - No visual loss  Facial Palsy (4): 0 - Normal symmetrical movements  Motor Arm, Left (5a): 0 - Normal (extends arm 90 degrees for 10 seconds without drift)  Motor Arm, Right (5b): 0 - Normal (extends arm 90 degrees for 10 seconds without drift)  Motor Leg, Left (6a):  0 - Normal holds leg in 30 degree position for full 5 secs without drift  Motor Leg, Right (6b): 0 - Normal holds leg in 30 degree position for full 5 secs without drift  Limb Ataxia (7): 0 - Absent  Sensory (8): 1 - Mild-to-moderate decrease in sensation due to stroke  Best Language (9): 0 - No aphasia,  Normal  Dysarthria (10): 0 - Normal Articulation  Extinction & Inattention (11): 0 - Normal/no neglect/absent  Total: 1    Labs/Imaging     I personally reviewed the patient's following laboratory data and radiology studies.    Recent Labs   Lab 06/07/23  1442 06/02/23  1421   WBC 8.5 6.1   RBC 4.6 4.5   Hemoglobin 13.4 12.8   Hematocrit 41 41   Platelets 251 277     Recent Labs   Lab 06/07/23  1442 06/02/23  1421   Sodium 140 139   Potassium 4.1 4.5   Chloride 102 103   CO2 23 27   UN 17 12   Creatinine 0.87 0.77   Glucose 98 98     Recent Labs   Lab 06/07/23  1442   INR 1.0     Recent Labs   Lab 06/02/23  1421   LDL Calculated 110   Hemoglobin A1C 5.6       Brain and vessel imaging:  CT head:  no early ischemic change, no hemorrhage, and no midline shift.      IV Thrombolytic Decision Making     I discussed acute treatment decisions with attending/fellow Dr. Cindie Crumbly, DO who personally reviewed the CT head scan on 06/07/2023 at 2:50 PM.    Was IV thrombolytic administered?: No                              Assessment     62 y.o. female with OSA, celiac  disease, ADHD, bipolar disorder, GERD presenting with intermittent right arm numbness, tingling, weakness, pain that has been ongoing for the last 6 weeks.    Patient's neurological exam is notable for mild weakness in right forearm secondary to pain in proximal lateral forearm, sensory changes in right forearm, and pain that is reproducible with palpation over right epicondyle with NIHSS of 1.  CT head showed no acute infarct or hemorrhage.    The patient's clinical presentation is most consistent with right lateral epicondylitis, particularly as symptoms have developed over the course of weeks and is reproducible with palpation over right epicondyle. Suspicion for stroke is low. She already will be seeing a physical therapist, scheduled for tomorrow. No further workup is indicated from a stroke perspective. She was given information for general neurology clinic in case she has questions in the future. She can be discharged from a neurologic standpoint.    The patient was not administered IV thrombolytic therapy at this hospital.  The patient did not undergo endovascular therapy.    Plan     - No further workup indicated from a stroke perspective  - Follow up with PT as planned  - Patient given contact information for general neurology clinic in case any concerns arise in the future  - Patient can be discharged from a neurologic standpoint  - If new neurological symptoms occur, obtain STAT CT head without contrast and page stroke neurology resident on-call    Patient seen and examined with stroke attending/fellow, Dr. Dola Argyle, Tsosie Billing, DO and  discussed with Dr. Ree Shay, MD    Author:  Maryann Alar, MD 06/07/2023 as of 4:54 PM

## 2023-06-08 ENCOUNTER — Telehealth: Payer: Self-pay

## 2023-06-08 ENCOUNTER — Ambulatory Visit
Payer: Commercial Managed Care - PPO | Attending: Physical Medicine and Rehabilitation | Admitting: Physical Medicine and Rehabilitation

## 2023-06-08 ENCOUNTER — Ambulatory Visit
Admission: RE | Admit: 2023-06-08 | Discharge: 2023-06-08 | Disposition: A | Discharge status: Home / Self Care | Payer: Commercial Managed Care - PPO | Source: Ambulatory Visit

## 2023-06-08 ENCOUNTER — Ambulatory Visit: Payer: Commercial Managed Care - PPO

## 2023-06-08 VITALS — BP 140/81 | HR 73 | Ht 68.0 in | Wt 132.0 lb

## 2023-06-08 DIAGNOSIS — M7711 Lateral epicondylitis, right elbow: Secondary | ICD-10-CM

## 2023-06-08 DIAGNOSIS — M25521 Pain in right elbow: Secondary | ICD-10-CM

## 2023-06-08 DIAGNOSIS — M7710 Lateral epicondylitis, unspecified elbow: Secondary | ICD-10-CM | POA: Insufficient documentation

## 2023-06-08 MED ORDER — METHOCARBAMOL 500 MG PO TABS *I*
ORAL_TABLET | ORAL | 0 refills | Status: DC
Start: 2023-06-08 — End: 2024-04-10

## 2023-06-08 MED ORDER — GENERIC DME *A*
0 refills | Status: AC
Start: 2023-06-08 — End: ?

## 2023-06-08 NOTE — Progress Notes (Signed)
Walk-in Visit  [facility]  [phone]    Patient name: Gail Jensen   MRN: [id number]      ICD-10-CM ICD-9-CM   1. Right lateral epicondylitis  M77.11 726.32       Pain    06/08/23 1438   PainSc:   8       The Patient was provided with the following items:  Device  Side: Right  Size:: Small  Model:: Wrist Lacer  Manufacturer: [employer]  Part Number: [id number]  Warranty:: 90 Days  Quantity: 1  Status: Delivered    Functional Goals:   Immobilization/reduce joint motion and Provide joint stability    ROM settings: N/A    Expected Frequency / Duration of Use:   Daily or Per physician orders    Modifications made: Trimmed straps/padding of the brace to accommodate volume of  upper extremity     Complexity:   Fitting was routine, requiring little to no adjustments    Yes, Device was inspected for safety/security and found to be functioning properly    Ordered/Delivered: Device Delivered    The patient given verbal and written instructions.  The following instructions were reviewed: Purpose of device  Cleaning / Care of device  Potential Risks / Benefits  Frequency / Duration of use  How to report potential failure / malfunctions    Angela Nevin, MSPO, CPO    [facility]  [phone]

## 2023-06-08 NOTE — Patient Instructions (Signed)
Patient Instructions for Wrist Splint  Purpose of device   You have been provided with a writ splint which is designed to treat sprains/strains, carpal tunnel, or following injury.  A wrist splint will immobilize your wrist and help reduce discomfort.     Wearing your wrist splint  Wrist splint is to be applied directly against skin.    Place hand in splint with thumb in side slot.    Stay should be positioned on the palm and in line with forearm.      Slide straps through D-rings and secure. Straps should be worn snugly and to patient comfort.    Cleaning and Care  Skin should be washed daily to prevent   Hand wash in cold water using mild soap, rinse thoroughly, and air dry.   Important to rinse brace thoroughly to avoid skin irritation.     Frequency / Duration of use   Wear your wrist splint as indicated by your physician who will determine the overall length of time you will need to use your wrist splint    Potential Risks / Benefits   Overall benefit of brace wear will depend on compliance.  Discontinue use of wearing splint if pain, swelling, or sensation changes occur.      How to report potential failure/malfunction   If you have any concerns or questions regarding the fit of wrist splint please contact [facility] at [phone]    [facility]      Your physician has determined that you require Prefabricated (off-the-shelf) Orthotic and Prosthetic (O&P) devices and/or Durable Medical Equipment (DME).  O&P devices include items such as braces for the spine or limbs, while DME includes items such as canes, crutches and walkers.  For your convenience you were fitted by the [facility] Department of Orthotics and Prosthetics.    Return Policy:    Prefabricated O&P and DME items are not returnable if used outside of clinic due to hygiene concerns.    Device cannot be returned or refunded.    O&P devices and DME purchased from the [facility] Department  may only be exchanged if the item is faulty or poorly fitting, as determined by the O&P Department Clinical Coordinator.  Exchanges will be made using the same type of device, if within 7 days of the purchase date.    No exchange will be issued for items that were not used in accordance with the manufacturer's recommended standard of care.    Replacement or repair of minor parts could become necessary due to wear.  This may include a charge and an order from you physician may be required.

## 2023-06-08 NOTE — Telephone Encounter (Signed)
Patient reached out as she was told by Member [employer] at [facility] that CPT code 16109 for surgery date 06/11/23 with Dr. Wynn Maudlin was not a covered benefit. Spoke with Elmarie Shiley S this morning at [facility] who confirmed patient's benefits, CPT code 60454 is a covered benefit, and no authorization is required. MyChart message sent to patient to relay information.

## 2023-06-10 ENCOUNTER — Other Ambulatory Visit: Payer: Self-pay | Admitting: Physical Medicine and Rehabilitation

## 2023-06-11 ENCOUNTER — Encounter: Payer: Self-pay | Admitting: Otolaryngology

## 2023-06-11 ENCOUNTER — Ambulatory Visit: Payer: Commercial Managed Care - PPO | Admitting: Anesthesiology

## 2023-06-11 ENCOUNTER — Encounter
Admission: RE | Disposition: A | Discharge status: Home / Self Care | Payer: Self-pay | Source: Ambulatory Visit | Attending: Otolaryngology

## 2023-06-11 ENCOUNTER — Other Ambulatory Visit: Payer: Self-pay

## 2023-06-11 ENCOUNTER — Ambulatory Visit
Admission: RE | Admit: 2023-06-11 | Discharge: 2023-06-11 | Disposition: A | Discharge status: Home / Self Care | Payer: Commercial Managed Care - PPO | Source: Ambulatory Visit | Attending: Otolaryngology | Admitting: Otolaryngology

## 2023-06-11 DIAGNOSIS — Z6820 Body mass index (BMI) 20.0-20.9, adult: Secondary | ICD-10-CM

## 2023-06-11 DIAGNOSIS — G4733 Obstructive sleep apnea (adult) (pediatric): Secondary | ICD-10-CM | POA: Insufficient documentation

## 2023-06-11 DIAGNOSIS — K219 Gastro-esophageal reflux disease without esophagitis: Secondary | ICD-10-CM | POA: Insufficient documentation

## 2023-06-11 HISTORY — PX: PR DISE DYN EVAL SLEEP DISORDERED BREATHING FLX DX: 42975

## 2023-06-11 SURGERY — DRUG INDUCED SLEEP ENDOSCOPY
Anesthesia: Monitor Anesthesia Care | Site: Throat | Wound class: Clean Contaminated

## 2023-06-11 MED ORDER — GLYCOPYRROLATE 0.2 MG/ML IJ SOLN *WRAPPED*
INTRAMUSCULAR | Status: DC | PRN
Start: 2023-06-11 — End: 2023-06-11
  Administered 2023-06-11: .2 mg via INTRAVENOUS

## 2023-06-11 MED ORDER — DEXTROSE 5 % FLUSH FOR PUMPS *I*
0.0000 mL/h | INTRAVENOUS | Status: DC | PRN
Start: 2023-06-11 — End: 2023-06-11

## 2023-06-11 MED ORDER — LACTATED RINGERS IV SOLN *I*
125.0000 mL/h | INTRAVENOUS | Status: DC
Start: 2023-06-11 — End: 2023-06-11
  Administered 2023-06-11: 125 mL/h via INTRAVENOUS

## 2023-06-11 MED ORDER — LIDOCAINE-EPINEPHRINE 1 %-1:100000 IJ SOLN *I*
INTRAMUSCULAR | Status: DC | PRN
Start: 2023-06-11 — End: 2023-06-11
  Administered 2023-06-11: 5 mL

## 2023-06-11 MED ORDER — SODIUM CHLORIDE 0.9 % IV SOLN WRAPPED *I*
20.0000 mL/h | Status: DC
Start: 2023-06-11 — End: 2023-06-11

## 2023-06-11 MED ORDER — DEXMEDETOMIDINE HCL 200 MCG/2ML IV SOLN WRAPPED *I*
INTRAVENOUS | Status: DC | PRN
Start: 2023-06-11 — End: 2023-06-11
  Administered 2023-06-11: 60 ug via INTRAVENOUS

## 2023-06-11 MED ORDER — LIDOCAINE HCL (PF) 1 % IJ SOLN *I*
0.1000 mL | INTRAMUSCULAR | Status: DC | PRN
Start: 2023-06-11 — End: 2023-06-11
  Filled 2023-06-11: qty 2

## 2023-06-11 MED ORDER — SODIUM CHLORIDE 0.9 % FLUSH FOR PUMPS *I*
0.0000 mL/h | INTRAVENOUS | Status: DC | PRN
Start: 2023-06-11 — End: 2023-06-11

## 2023-06-11 MED ORDER — PROPOFOL 10 MG/ML IV EMUL (INTERMITTENT DOSING) WRAPPED *I*
INTRAVENOUS | Status: DC | PRN
Start: 2023-06-11 — End: 2023-06-11
  Administered 2023-06-11: 20 mg via INTRAVENOUS

## 2023-06-11 MED ORDER — HALOPERIDOL LACTATE 5 MG/ML IJ SOLN *I*
0.5000 mg | Freq: Once | INTRAMUSCULAR | Status: DC | PRN
Start: 2023-06-11 — End: 2023-06-11

## 2023-06-11 MED ORDER — LIDOCAINE-EPINEPHRINE 1 %-1:100000 IJ SOLN *I*
INTRAMUSCULAR | Status: AC
Start: 2023-06-11 — End: 2023-06-11
  Filled 2023-06-11: qty 40

## 2023-06-11 MED ORDER — ONDANSETRON HCL 2 MG/ML IV SOLN *I*
4.0000 mg | Freq: Once | INTRAMUSCULAR | Status: DC | PRN
Start: 2023-06-11 — End: 2023-06-11

## 2023-06-11 SURGICAL SUPPLY — 6 items
CUP MEDICINE 60ML 2OZ CLEAR GRADUATED DISP PLASTIC NO LID (Supply) ×1 IMPLANT
ELECTRODE ADULT POLYHESIVE PAT RETURN (Supply) ×1 IMPLANT
PATTIE SURGICAL 1/2IN X 3IN (Supply) ×1 IMPLANT
PEN SURG MARKER REG TIP (Supply) ×1 IMPLANT
PROTECTOR ULNA NERVE ~~LOC~~ (Supply) ×1 IMPLANT
SOLUTION ANTI-FOG W/ FOAM PAD (Supply) ×1 IMPLANT

## 2023-06-11 NOTE — Op Note (Addendum)
INDICATIONS FOR PROCEDURE:     Gail Jensen  is a 62 y.o. female presenting for CPAP-alternative evaluation for the management of obstructive sleep apnea.     The indications, risks and limitations of DISE were reviewed with the patient and written informed consent was obtained.     DESCRIPTION OF PROCEDURE:      The patient was identified in the holding area, written informed consent was obtained and the patient was then brought back to the operating room and laid supine on the operating room table.  A pillow was positioned under the head, room lights were turned down and blanket placed on patient with the neck visible.  The arms were secured.  Preoperative time out was completed. Glycopyrrolate was administered by the anesthesiologist.     The nose was examined with anterior rhinoscopy and 1% lidocaine with 1:100,000 epinephrine was applied on a pledget to the more patient nasal cavity, taking care not to over-saturate the pledget or anesthetize the pharynx. No supplemental oxygen was used during the procedure.     IV sedation with dexmedetomidine and propofol then began. The patient was observed for sleep, snoring, non-response to loud verbal and moderate tactile stimuli, snoring and evidence of obstruction as well as oxygen desaturation in order to assess sedation level and guide administration of anesthetic agents.     Flexible fiberoptic nasopharyngeal laryngoscope was introduced into the more patent nasal cavity.     The following score was obtained at inspiration:     Velopharynx:    Grade 2 , AP collapse   Oropharynx:     Grade 2 lateral collapse   Tongue base:   Grade 0 AP collapse  Epiglottis:         Grade 0 AP collapse  Complete Concentric Collapse of the Velum? No  Snoring: Yes   Other: None      The patient was then returned to the care of the anesthesiologist.  Patient woke up spontaneously without need for invasive airway and was returned to the recovery area in stable  condition.      Signed:  Buel Ream, MD  on 06/11/2023 at 9:06 AM    I was present for the entire procedure  Sheliah Mends, MD  06/11/2023  9:55 AM

## 2023-06-11 NOTE — Progress Notes (Deleted)
 Gail Jensen , a 62 y.o. female, was seen in the Otolaryngology clinic for CPAP alternative treatment for obstructive sleep apnea.     History of present illness:     This patient was diagnosed with obstructive sleep apnea. Most recent HST   demonstrates AHI/uAHI of 25.2 with central/mixed component of 3 % of all events. SHe is unable to tolerate treatment with CPAP due to ***. They are interested in exploring alternatives to PAP treatment for OSA, in particular hypoglossal nerve stimulation. BMI is 20.     Sleep medicine provider: [facility]     Currently treated for OSA? No     Records independently reviewed to complete the above history: sleep study, Sleep Medicine notes.     Past Medical History:    Past Medical History:   Diagnosis Date    Anxiety     Arthritis     Celiac disease     Depression     GERD (gastroesophageal reflux disease)        Patient Active Problem List   Diagnosis Code    Lumbar radiculopathy M54.16    Attention deficit hyperactivity disorder (ADHD) F90.9    Celiac disease K90.0    Gastroesophageal reflux disease K21.9    Psoriasis of scalp L40.9    Bipolar disorder F31.9        Medications:     Medications were reviewed and attested separately in the EMR    Physical Examination:     GENERAL:  Alert and oriented, in no acute distress.  Communicating easily.  Voice quality is within normal limits.   FACE:  The face is atraumatic. Facial skin is without abnormal appearing lesions. The parotid glands are non tender and without masses.   NOSE:  Anterior rhinoscopy demonstrates that the nasal mucosa is intact without inflammation, lesions or polyps, bleeding or evidence of infection. The nasal septum is midline and both anterior nasal cavities are patent.   ORAL CAVITY: There is no trismus and the mucosa of the oral cavity is without lesions.   OROPHARYNX: The mucosa of the oropharynx is without lesions and there is no evidence of infection. Palatine tonsils are ***. Modified Mallampati  grade ***.   NECK:  No palpable abnormal masses. All neck structures are normal to palpation.     Assessment and Plan:     Gail Jensen is a 62 y.o. female with *** obstructive sleep apnea and intolerance of positive air pressure (PAP) treatment. The nature of OSA, relevant pathophysiology, and approach to PAP alternatives for the treatment of adult obstructive sleep apnea was reviewed with he patient. This begins with drug-induced sleep endoscopy (DISE) to characterize relevant obstructive upper airway anatomy in a dynamic fashion during simulated sleep. Further treatment including selective upper airway stimulation will then be planned depending on DISE findings. The DISE procedure was reviewed with the patient and questions were answered; it will be scheduled for the near future with subsequent follow up to discuss treatment options. For patients on on anticoagulation, this does not need to be stopped prior to DISE.     This is a moderate complexity medical decision making encounter.     I appreciate the opportunity to be involved in the care of this patient.     Sheliah Mends, MD      `

## 2023-06-11 NOTE — Anesthesia Preprocedure Evaluation (Addendum)
Anesthesia Pre-operative History and Physical for [facility]  History and Physical Performed at [facility]  Highlighted Issues for this Procedure:  62 y.o. female with Obstructive sleep apnea [G47.33] presenting for Procedure(s) (LRB):  DRUG INDUCED SLEEP ENDOSCOPY (N/A) by Surgeon(s):  Sheliah Mends, MD  Buel Ream, MD scheduled for 35 minutes.  BMI Readings from Last 1 Encounters:  06/11/23 : 20.07 kg/m          .  CPM/PAT Summary:  Gail Jensen presents preoperatively for anesthesia evaluation prior to Drug induced Sleep Endoscopy by Dr. Wynn Maudlin  . She  has a past medical history of Anxiety, Arthritis, Celiac disease, Depression, and GERD (gastroesophageal reflux disease). .    Exercise tolerance:  The patient has no dyspnea, denies chest pain, is moderately active, climbs stairs, and can lie flat     Denies any known personal/family issues with anesthesia.     Denies any current cardiovascular issues today.             CPM H&P performed by Liliane Shi, PA  at 06/11/2023 7:24 AM  Anesthesia Evaluation Information Source: patient, records     ANESTHESIA HISTORY     Denies anesthesia history  Pertinent(-):  No History of anesthetic complications or Family hx of anesthetic complications    GENERAL     Denies general issues  Comment: Denies any open sores, wounds or rashes today     Pertinent (-):  No obesity, substance abuse, history of anesthetic complications or Family Hx of Anesthetic Complications    HEENT     Denies HEENT issues  Pertinent (-):  No hearing loss or neck pain PULMONARY    + Snoring  Pertinent(-):  No smoking, asthma, shortness of breath, recent URI, cough/congestion or COPD    CARDIOVASCULAR     Denies cardiovascular issues  Good(4+METs) Exercise Tolerance  Pertinent(-):  No hypertension, past MI or hx of DVT    GI/HEPATIC/RENAL   NPO: > 8hrs ago (solids) and > 2hrs ago (clears) Last fluids 2100      + GERD          well controlled    + Alcohol use          1  drink/day  Pertinent(-):  No liver  issues, bowel issues or urinary issues  NEURO/PSYCH/ORTHO    + Neuropsychiatric Issues          depression, bipolar    + Neuromuscular disease  Pertinent(-):  No headaches, dizziness/motion sickness, syncope, seizures, cerebrovascular event, gait/mobility issues or positioning issues    ENDO/OTHER     Denies endo issues  Pertinent(-):  No diabetes mellitus, thyroid disease    HEMATOLOGIC    + Arthritis          lumbar     Physical Exam    Airway            Mouth opening: normal            Mallampati: II            Neck ROM: full            Airway Impression: easy  Dental   Normal Exam   Cardiovascular  Normal Exam           Rhythm: regular           Rate: normal  No weak pulses, peripheral edema or murmur      Neurologic    Normal Exam  No sensory deficit and  motor deficit    General Survey    Normal Exam  No rashes, wounds   Pulmonary   Normal Exam    breath sounds clear to auscultation    No cough, rhonchi, decreased breath sounds    Mental Status   Normal Exam    oriented to person, place and time           ________________________________________________________________________  PLAN  ASA Score  2  Anesthetic Plan MAC       Induction (routine IV) General Anesthesia/Sedation Maintenance Plan (propofol infusion and IV bolus);  Airway Manipulation (none); Airway (nasal cannula); Line ( use current access); Monitoring (standard ASA); Positioning (supine); PONV Plan (no volatile agents and propofol infusion); Pain (per surgical team); PostOp (PACU)Standard Attestation    Informed Consent     Risks:          Risks discussed were commensurate with the plan listed above with the following specific points: N/V, aspiration, sore throat, hypotension and infection, Damage to: blood vessels, teeth, nerves and eyes, allergic Rx, unexpected serious injury, awareness and death.    Anesthetic Consent:         Anesthetic plan (and risks as noted above) were discussed with patient    Plan also  discussed with team members including:       attending    Responsible Anesthesia Provider Attestation:  I attest that the patient or proxy understands and accepts the risks and benefits of the anesthesia plan. I also attest that I have personally performed a pre-anesthetic examination and evaluation, and prescribed the anesthetic plan for this particular location within 48 hours prior to the anesthetic as documented. Minerva Fester, MD  06/11/23, 8:58 AM

## 2023-06-11 NOTE — H&P (Signed)
UPDATES TO PATIENT'S CONDITION on the DAY OF SURGERY/PROCEDURE    I. Updates to Patient's Condition (to be completed by a provider privileged to complete a H&P, following reassessment of the patient by the provider):    Day of Surgery/Procedure Update:  History  History reviewed and no change    Physical  Physical exam updated and no change            II. Procedure Readiness   I have reviewed the patient's H&P and updated condition. By completing and signing this form, I attest that this patient is ready for surgery/procedure.    III. Attestation   I have reviewed the updated information regarding the patient's condition and it is appropriate to proceed with the planned surgery/procedure.      Sheliah Mends, MD as of 8:57 AM 06/11/2023

## 2023-06-11 NOTE — Anesthesia Case Conclusion (Signed)
CASE CONCLUSION  Emergence  Assessment:  Routine  Transport  Directly to: PACU  Position:  Recumbent  Patient Condition on Handoff  Level of Consciousness:  Alert/talking/calm  Patient Condition:  Stable  Handoff Report to:  RN

## 2023-06-11 NOTE — Invasive Procedure Plan of Care (Signed)
CONSENT FOR MEDICAL  OR SURGICAL PROCEDURE                            Patient Name: Gail Jensen  [facility] [address]                                                              DOB: Dec 07, 1960         Please read this form or have someone read it to you.   It's important to understand all parts of this form. If something isn't clear, ask [place] to explain.   When you sign it, that means you understand the form and give [place] permission to do this surgery or procedure.     I agree for Sheliah Mends, MD , and ENT Team along with any assistants* they may choose, to treat the following condition(s): Obstructive sleep apnea   By doing this surgery or procedure on me: Observing the airway with a camera during simulated sleep   This is also known as: Drug induced sleep endoscopy   Laterality: Not applicable     *if you'd like a list of the assistants, please ask. We can give that to you.    1. The care provider has explained my condition to me. They have told me how the procedure can help me. They have told me about other ways of treating my condition. I understand the care provider cannot guarantee the result of the procedure. If I don't have this procedure, my other choices are: Observation, CPAP use    2. The care provider has told me the risks (problems that can happen) of the procedure. I understand there may be unwanted results. The risks that are related to this procedure include: Risk of anesthesia, nosebleed, risks to adjacent structures, need for further procedures    3. I understand that during the procedure, my care provider may find a condition that we didn't know about before the treatment started. Therefore, I agree that my care provider can perform any other treatment which they think is necessary and available.    4. I give permission to the hospital and/or its departments to examine and keep tissue, blood, body parts, fluids or materials removed from my body during the procedure(s) to aid in diagnosis and  treatment, after which they may be used for scientific research or teaching by appropriate persons. If these materials are used for science or teaching, my identity will be protected. I will no longer own or have any rights to these materials regardless of how they may be used.    5. My care provider might want a representative from a medical device company to be there during my procedure. I understand that person works for:          The ways they might help my care provider during my procedure include:            6. Here are my decisions about receiving blood, blood products, or tissues. I understand my decisions cover the time before, during and after my procedure, my treatment, and my time in the hospital. After my procedure, if my condition changes a lot, my care provider will talk with me again about receiving blood or blood  products. At that time, my care provider might need me to review and sign another consent form, about getting or refusing blood.    I understand that the blood is from the community blood supply. Volunteers donated the blood, the volunteers were screened for health problems. The blood was examined with very sensitive and accurate tests to look for hepatitis, HIV/AIDS, and other diseases. Before I receive blood, it is tested again to make sure it is the correct type.    My chances of getting a sickness from blood products are small. But no transfusion is 100% safe. I understand that my care provider feels the good I will receive from the blood is greater than the chances of something going wrong. My care provider has answered my questions about blood products.      My decision  about blood or  blood products   N/A        My decision   about tissue  Implants     N/A          I understand this  form.    My care provider  or his/her  assistants have  explained:   What I am having done and why I need it.  What other choices I can make instead of having this done.  The benefits and possible risks  (problems) to me of having this done.  The benefits and possible risks (problems) to me of receiving transplants, blood, or blood products.  There is no guarantee of the results.  The care provider may not stay with me the entire time that I am in the operating or procedure room.  My provider has explained how this may affect my procedure. My provider has answered my  questions about this.         I give my  permission for  this surgery or  procedure.            _______________________________________________                                     My signature  (or parent or other person authorized to sign for you, if you are unable to sign for  yourself or if you are under 60 years old)        ______           Date        _____        Time   Electronic Signatures will display at the bottom of the consent form.    Care provider's statement: I have discussed the planned procedure, including the possibility for transfusion of blood  products or receipt of tissue as necessary; expected benefits; the possible complications and risks; and possible alternatives  and their benefits and risks with the patients or the patient's surrogate. In my opinion, the patient or the patient's surrogate  understands the proposed procedure, its risks, benefits and alternatives.              Electronically signed by: Buel Ream, MD                                                06/11/2023         Date  5:40 AM        Time

## 2023-06-11 NOTE — Discharge Instructions (Signed)
POST-OPERATIVE CARE INSTRUCTIONS (DRUG-INDUCED SLEEP ENDOSCOPY)    You have undergone a drug induced sleep endoscopy.  This information is provided to facilitate proper patient care following surgery.    ACTIVITY:  You are encouraged to rest 1 day following surgery and then gradually increase activity as tolerated.  You are encouraged to move and ambulate in order to prevent blood clots in your legs.       WOUND:  None.      PAIN:  It is common to experience no pain after surgery.  If you do have pain, please take acetaminophen (tylenol) or ibuprofen (advil, motrin or aleve).        DIET:  Start with a clear liquid diet and advance as tolerated to a regular diet.  You may use an anti-emetic if you experience nausea.      FOLLOW-UP:  You should have a follow up appointment with Dr. Revis Whalin to review results and treatment options.     CALL THE OFFICE IF:  Difficulty breathing, nosebleed, other unexpected side effects.    Call our office at [phone] with any questions or concerns.

## 2023-06-11 NOTE — Anesthesia Procedure Notes (Signed)
---------------------------------------------------------------------------------------------------------------------------------------    AIRWAY   GENERAL INFORMATION AND STAFF       Date of Procedure: 06/11/2023 9:10 AM  FINAL AIRWAY DETAILS    Final Airway Type:  None (no supplemental oxygen/airway)  ----------------------------------------------------------------------------------------------------------------------------------------

## 2023-06-11 NOTE — Anesthesia Postprocedure Evaluation (Signed)
Anesthesia Post-Op Note    Patient: Gail Jensen    Procedure(s) Performed:  Procedure Summary  Date:  06/11/2023 Anesthesia Start: 06/11/2023  9:01 AM Anesthesia Stop: 06/11/2023  9:16 AM Room / Location:  H_OR_02 / [facility] MAIN OR   Procedure(s):  DRUG INDUCED SLEEP ENDOSCOPY Diagnosis:  Obstructive sleep apnea [G47.33] Surgeon(s):  Sheliah Mends, MD  Buel Ream, MD Responsible Anesthesia Provider:  Minerva Fester, MD         Recovery Vitals  BP: 111/74 (06/11/2023 10:48 AM)  Heart Rate: 67 (06/11/2023 10:48 AM)  Heart Rate (via Pulse Ox): 71 (06/11/2023 10:00 AM)  Resp: 16 (06/11/2023 10:48 AM)  Temp: 36.3 C (97.3 F) (06/11/2023 10:48 AM)  SpO2: 99 % (06/11/2023 10:48 AM)  O2 Flow Rate: 2 L/min (06/11/2023  9:45 AM)   0-10 Scale: 0 (06/11/2023 10:48 AM)    Anesthesia type:  MAC  Complications Noted During Procedure or in PACU:  None   Comment:    Patient Location:  PACU  Level of Consciousness:    Recovered to baseline  Patient Participation:     Able to participate  Temperature Status:    Normothermic  Oxygen Saturation:    Within patient's normal range  Cardiac Status:   within patient's normal range  Fluid Status:    Stable  Airway Patency:     Yes  Pulmonary Status:    Baseline  Pain Management:    Adequate analgesia  Nausea and Vomiting:  None    Post Op Assessment:    Tolerated procedure well  Responsible Anesthesia Provider Attestation:  All indicated post anesthesia care provided       -

## 2023-06-13 ENCOUNTER — Encounter: Payer: Self-pay | Admitting: Emergency Medicine

## 2023-06-13 ENCOUNTER — Other Ambulatory Visit: Payer: Self-pay | Admitting: Obstetrics and Gynecology

## 2023-06-13 DIAGNOSIS — Z79890 Hormone replacement therapy (postmenopausal): Secondary | ICD-10-CM

## 2023-06-14 ENCOUNTER — Encounter: Payer: Self-pay | Admitting: Otolaryngology

## 2023-06-14 ENCOUNTER — Ambulatory Visit: Payer: Commercial Managed Care - PPO | Admitting: Otolaryngology

## 2023-06-14 MED ORDER — PROGESTERONE 200 MG PO CAPS *I*
ORAL_CAPSULE | ORAL | 0 refills | Status: DC
Start: 2023-06-14 — End: 2023-08-13

## 2023-06-14 NOTE — Progress Notes (Addendum)
 Patient: Gail Jensen   DOB: February 04, 1961   MRN: [id number]    Date: 06/24/2023       Chief Complaint  RUL paresthesias      Subjective    History of Present Illness   The RHD patient has no prior history of RUL symptoms until 02/2023 when she developed right lateral elbow pain that progressively involved the distal RUL, although there is no specific inciting injury symptoms onset during a period that she was moving over a course of 3 weeks.  They describe right lateral elbow pain and paresthesias that radiate down the posterior forearm, posterior hand and posterior digits 2-4; there is associated sensation of her fingers feeling cold and turning purple; and unclear nonfocal weakness question of pain limitation.  Symptoms are worse with holding things in her right hand that weigh more than a pen.  Symptoms improve with rest.    -At baseline there is history of neck pain but this is not radicular.    Prior Care   -05/28/2023 PCP: Seen for multiple issues including numbness and tingling for about 2 weeks.  Patient reports progressive weakness and numbness in right arm, particularly in the hand. Symptoms include difficulty gripping objects, carrying items, and cold sensation. Tingling 2 cold sensation started at elbow and progressed to hand. No neck or upper arm involvement reported. Symptoms suggestive of possible cervical radiculopathy or ulnar neuropathy.  Physical exam reveals weak grip strength in the right hand. A/P included Sharp, paresthesia to elbow and forearm with palpitation of area superficial to right enlarged nerve. Plans include MRI of the cervical spine to evaluate for c7-c8 dermatome nerve impingement, CT head to rule out old stroke, trial of famotidine for GERD, adjustment of Adderall dosage, and continuation of current medications for anxiety and depression.   -06/07/23 ED: presents to the emergency department due to right upper extremity pain, numbness and perceived discoloration.  Patient was called  a stroke alert at time of triage.  Patient reports that she was at work when she started experiencing pain and numbness at around 11:00 in her forearm. She states that it progressively worsened until she tried to answer the phone and had difficulty moving her arm so she decided to come in for further evaluation.  On further evaluation, patient reports that she has been having some of this discomfort ongoing for few months.  Was called a stroke alert at time of triage due to a right upper extremity numbness/weakness. At time of assessment patient is in no acute distress. Patient's vitals are notable for borderline hypertension otherwise normal. At this time it is possible patient could be experiencing an ICH or CVA-patient seen in coordination with stroke team and taken for stroke imaging and labs. Patient does have point tenderness to the lateral upper condyle and most of the numbness and weakness appears distal to this prompting consideration that this pain could be due to a lateral epicondylitis/radicular pain. Will trial Tylenol and lidocaine patch. A/P included Patient has remained well while in the emergency department. Patient was seen and evaluated by neurology team with reassuring exam. Patient was given Tylenol and lidocaine patch to help with pain related to likely lateral epicondylitis. At this time, patient discharged with follow-up instructions and return precautions provided.   -06/07/23 Neuro: intermittent right arm numbness, tingling, weakness, pain that has been ongoing for the last 6 weeks.  Case was initially called as a stroke alert because she reported that her symptoms started at 1000 today. On further  clarification, she reports that the symptoms started 6 weeks ago but have been worsening since then. She describes the onset of pain over her right upper forearm that radiates down her forearm to her right 2nd-3rd fingers, accompanied by numbness, tingling, and weakness. In recent weeks, she has  felt that her forearm and hand become cold and change colors (purple, blue).   -06/07/23 NSGY: -Patient symptoms have been on going for the past 6 weeks, intermittent in nature, and associated with pain and coolness to hand. Stroke is an unlikely diagnosis. Would work up other causes of her symptoms.  No surgical intervention warranted. Please recall for any questions/concerns.     Clinical Course  -06/24/23: At her last OV she was recommended for right elbow x-rays (completed); Dx US  right lateral elbow; wrist brace (obtained); initiate care PT/OT (pending for next week); methocarbamol (trialed); and MRI cervical spine was pending as per PCP (?12/26).  She reports ongoing symptoms and no benefit with methocarbamol.      Function  -06/08/2023 symptoms interfere with her ability to perform tasks that involve prehension such as pushing the side buttons on her phone and with writing.  -06/08/2023 symptoms interfere with holding objects weighing more than a pen, for example even picking up her coffee cup is painful.    Patient Reported Outcomes  06/08/2023   I reviewed the patient reported outcomes.  Depression screening came back normal with a score of 54.  Pain Interferences creening came back moderate with a score of 64. Physical Function screening came back moderate with a score of 31.     Conservative Treatment   PT/OT (06/08/23 recommended.  06/23/22 first appt pending for 06/30/23).  Wrist brace     Medication Management   Topical lidocaine  Ibuprofen  Methocarbamol ( no help )  Sertraline  Lamotrigine  Melatonin  Trazodone    Interventional Procedures  None    Relevant Surgeries  None          Objective    Medical History     Past Medical History:   Diagnosis Date    Anxiety     Arthritis     Celiac disease     Depression     GERD (gastroesophageal reflux disease)       Patient Active Problem List   Diagnosis Code    Lumbar radiculopathy M54.16    Attention deficit hyperactivity disorder (ADHD) F90.9    Celiac  disease K90.0    Gastroesophageal reflux disease K21.9    Psoriasis of scalp L40.9    Bipolar disorder F31.9   Insomnia       Past Surgical History:   Procedure Laterality Date    BREAST ENHANCEMENT SURGERY Bilateral 11/1998    CESAREAN SECTION, UNSPECIFIED  1996    EYE SURGERY      Cataract surgery 9.13.24    LAMINECTOMY  08/2021    PR ARTHRODESIS COMBINED TQ 1NTRSPC LUMBAR N/A 03/16/2022    Procedure: L4-5 TLIF;  Surgeon: Guido Leeks, MD;  Location: [facility] MAIN OR;  Service: Orthopedics    PR DISE DYN EVAL SLEEP DISORDERED BREATHING FLX DX N/A 06/11/2023    Procedure: DRUG INDUCED SLEEP ENDOSCOPY;  Surgeon: Ella Gun, MD;  Location: [facility] MAIN OR;  Service: ENT    PR REINSERTION SPINAL FIXATION DEVICE N/A 04/01/2022    Procedure: Revision of right L4 screw, Globus (120 min);  Surgeon: Guido Leeks, MD;  Location: [facility] MAIN OR;  Service: Orthopedics    SPINE SURGERY  09-05-21, Sept. 2023, Oct. 2024       Social History     Socioeconomic History    Marital status: Divorced   Tobacco Use    Smoking status: Never    Smokeless tobacco: Never   Substance and Sexual Activity    Alcohol use: Not Currently     Alcohol/week: 7.0 standard drinks of alcohol     Types: 7 Glasses of wine per week     Comment: Glass of wine w/ dinner nightly    Drug use: Not Currently     Types: Other-see comments     Comment: Sleep gummies  Last used summer 2024    Sexual activity: Not Currently     Partners: Male     Comment: N/a     Assistive Device: none      Occupational History  Work Status: working  Job duties include: office work      Family History   family history includes Anxiety disorder in her mother; Arthritis in her maternal grandmother; Bladder Cancer in her mother; Cancer in her mother; Depression in her daughter, mother, sister, and son; Diabetes in her maternal aunt; GERD in her mother; Kidney Disease in her mother; Liver cancer in her mother.       Allergies   Allergies   Allergen Reactions    Ciprofloxacin Hives     Sulfa Antibiotics Hives     hives    Sulfasalazine Other (See Comments)     hives    Wheat Other (See Comments)     Celiac disease     History of adverse reaction to anesthetics: no  History of adverse

## 2023-06-14 NOTE — Telephone Encounter (Signed)
Patient's pharmacy requesting a refill of estradiol 0.05 mg twice weekly patch. Last seen 03/18/23 for medication check. Pended if ok, thank you

## 2023-06-16 ENCOUNTER — Other Ambulatory Visit: Payer: Self-pay

## 2023-06-17 ENCOUNTER — Ambulatory Visit
Admission: RE | Admit: 2023-06-17 | Discharge: 2023-06-17 | Disposition: A | Discharge status: Home / Self Care | Payer: Commercial Managed Care - PPO | Source: Ambulatory Visit | Attending: Family Medicine | Admitting: Family Medicine

## 2023-06-17 ENCOUNTER — Other Ambulatory Visit: Payer: Self-pay

## 2023-06-17 DIAGNOSIS — I7090 Unspecified atherosclerosis: Secondary | ICD-10-CM | POA: Insufficient documentation

## 2023-06-17 DIAGNOSIS — I672 Cerebral atherosclerosis: Secondary | ICD-10-CM

## 2023-06-18 ENCOUNTER — Telehealth: Payer: Self-pay | Admitting: Family Medicine

## 2023-06-18 NOTE — Telephone Encounter (Signed)
-----   Message from Saverio Danker, MD sent at 06/18/2023  7:58 AM EST -----  Can you let Gail Jensen know that her carotid doppler looked great!  There was mild calcification in the right side but nothing to worry about at all, no stenosis (narrowing due to blockage) that would cause problems. None on the left!

## 2023-06-18 NOTE — Telephone Encounter (Signed)
Spoke with pt and advised

## 2023-06-21 ENCOUNTER — Encounter: Payer: Self-pay | Admitting: Family Medicine

## 2023-06-23 ENCOUNTER — Other Ambulatory Visit: Payer: Self-pay

## 2023-06-23 ENCOUNTER — Encounter: Payer: Self-pay | Admitting: Family Medicine

## 2023-06-24 ENCOUNTER — Encounter: Payer: Self-pay | Admitting: Physical Medicine and Rehabilitation

## 2023-06-24 ENCOUNTER — Ambulatory Visit: Payer: Commercial Managed Care - PPO | Admitting: Physical Medicine and Rehabilitation

## 2023-06-24 ENCOUNTER — Telehealth: Payer: Self-pay | Admitting: Otolaryngology

## 2023-06-24 VITALS — BP 107/75 | HR 78 | Ht 68.0 in | Wt 132.0 lb

## 2023-06-24 DIAGNOSIS — M7711 Lateral epicondylitis, right elbow: Secondary | ICD-10-CM

## 2023-06-24 DIAGNOSIS — M7710 Lateral epicondylitis, unspecified elbow: Secondary | ICD-10-CM

## 2023-06-24 MED ORDER — TRAZODONE HCL 150 MG PO TABS *A*
150.0000 mg | ORAL_TABLET | Freq: Every evening | ORAL | 1 refills | Status: DC
Start: 2023-06-24 — End: 2023-12-27

## 2023-06-24 MED ORDER — SERTRALINE HCL 100 MG PO TABS *I*
100.0000 mg | ORAL_TABLET | Freq: Every day | ORAL | 1 refills | Status: DC
Start: 2023-06-24 — End: 2023-12-27

## 2023-06-24 NOTE — Progress Notes (Signed)
 Patient: Gail Jensen   DOB: [birth year]   MRN: [id number]     Date: 06/24/2023     Chief Complaint  -RUL paresthesia and lateral elbow pain    Study  -This is a sonographic evaluation of the right lateral elbow.  -A GE Logiq P9 and Linear array with a frequency range of 9-12 MHz was used.     Findings  In order to optimize accuracy and make appropriate recommendations a side-to-side analysis was made and the following structures were evaluated in both short axis and long axis with utilization of dynamic imaging as appropriate: the bony acoustic landmarks of the lateral epicondyle and radial head; common extensor tendon which was followed from its insertion on the lateral epicondyle to the myotendinous junction and supinator muscle; radial ligament; and, radial nerve from the level of the bifurcation into the superficial radial nerve and PIN at the level of the arm.      The asymptomatic left side was evaluated for comparison.      The symptomatic right side did not have any joint effusion.  The common extensor tendon was in continuity with thickening, heterogenous echotexture change,osteophyte at the insertion, intrasubstance partial thickness tear of the undersurface approximating the radial collateral ligament, and neovascularity on PDI.  The radial collateral ligament appeared in continuity.  The posterior interosseous nerve appeared enlarged in the distal supinator.    Impression  Right common extensor tendinosis with partial thickness tear at the undersurface and neovascularity; and, enlargement of the posterior interosseous nerve at the distal edge of the supinator.

## 2023-06-24 NOTE — Telephone Encounter (Signed)
 Sent MyChart message in regards to bumped appt with Dr. Wynn Maudlin on 06/28/2023. Tentatively rescheduled.

## 2023-06-24 NOTE — Telephone Encounter (Signed)
 Last office visit:   06/01/2023  Patients upcoming appointments:  Future Appointments   Date Time Provider Department Center   06/24/2023  8:00 AM Howell Macintosh, DO [facility] None   06/27/2023  9:40 AM RIS, PF MRI1 (MRI1) [facility] [place]   06/28/2023  2:20 PM Ella Gun, MD [facility] None   06/30/2023  9:30 AM Read, Craige Dixon, PT,DPT,CHT [facility] None   09/06/2023  2:40 PM Hali Leventhal, MD [facility] None     Recent Lab results:  GENERAL CHEMISTRY   Recent Labs     06/07/23  1442 06/02/23  1421   NA 140 139   K 4.1 4.5   CL 102 103   CO2 23 27   GAP 15 9   UN 17 12   CREAT 0.87 0.77   GLU 98 98   CA 9.9 9.6      LIPID PROFILE   Recent Labs     06/02/23  1421   CHOL 210*   TRIG 106   HDL 79*   LDLC 110      LIVER PROFILE   Recent Labs     06/02/23  1421   ALT 25   AST 22   ALK 56   TB 0.2      DIABETES THYROID   Recent Labs     06/02/23  1421 09/23/22  1027   HA1C 5.6 5.8*    Recent Labs     11/12/22  1058   TSH 2.08         Pending/Orders Labs:  Lab Frequency Next Occurrence   Echo Complete Once 01/20/2023   MR neck soft tissue without contrast Once 05/28/2023

## 2023-06-25 NOTE — Telephone Encounter (Signed)
 Called the patient and confirmed video visit for 01/07 at 08:40am with Dr. Liliane Rei. All set.

## 2023-06-27 ENCOUNTER — Other Ambulatory Visit: Payer: Self-pay

## 2023-06-27 ENCOUNTER — Ambulatory Visit
Admission: RE | Admit: 2023-06-27 | Discharge: 2023-06-27 | Disposition: A | Discharge status: Home / Self Care | Payer: Commercial Managed Care - PPO | Source: Ambulatory Visit | Attending: Family Medicine | Admitting: Family Medicine

## 2023-06-27 DIAGNOSIS — M4802 Spinal stenosis, cervical region: Secondary | ICD-10-CM | POA: Insufficient documentation

## 2023-06-27 DIAGNOSIS — M47812 Spondylosis without myelopathy or radiculopathy, cervical region: Secondary | ICD-10-CM | POA: Insufficient documentation

## 2023-06-27 DIAGNOSIS — R202 Paresthesia of skin: Secondary | ICD-10-CM

## 2023-06-27 DIAGNOSIS — R29898 Other symptoms and signs involving the musculoskeletal system: Secondary | ICD-10-CM | POA: Insufficient documentation

## 2023-06-28 ENCOUNTER — Encounter: Payer: Self-pay | Admitting: Family Medicine

## 2023-06-28 ENCOUNTER — Ambulatory Visit: Payer: Commercial Managed Care - PPO | Admitting: Otolaryngology

## 2023-06-29 ENCOUNTER — Other Ambulatory Visit: Payer: Self-pay

## 2023-06-29 ENCOUNTER — Ambulatory Visit: Payer: Commercial Managed Care - PPO | Admitting: Otolaryngology

## 2023-06-29 ENCOUNTER — Encounter: Payer: Self-pay | Admitting: Otolaryngology

## 2023-06-29 DIAGNOSIS — Z789 Other specified health status: Secondary | ICD-10-CM

## 2023-06-29 DIAGNOSIS — G4733 Obstructive sleep apnea (adult) (pediatric): Secondary | ICD-10-CM

## 2023-06-29 DIAGNOSIS — Z6820 Body mass index (BMI) 20.0-20.9, adult: Secondary | ICD-10-CM

## 2023-06-29 DIAGNOSIS — G4700 Insomnia, unspecified: Secondary | ICD-10-CM

## 2023-06-29 NOTE — Progress Notes (Signed)
Gail Jensen , a 63 y.o. female, was seen in the Otolaryngology clinic for CPAP alternative treatment for obstructive sleep apnea.     History of present illness:     This patient was diagnosed with obstructive sleep apnea. Most recent HST  in 2024 demonstrates AHI/uAHI of 25 with central/mixed component of 3 % of all events. She is unable to tolerate treatment with CPAP due to the machine coming off repeatedly. She also did not benefit from MAD or mouth tape. They are interested in exploring alternatives to PAP treatment for OSA, in particular hypoglossal nerve stimulation. BMI is 20.     Sleep medicine provider: [facility]     Currently treated for OSA? No     Records independently reviewed to complete the above history: sleep study, Sleep Medicine notes.     Past Medical History:    Past Medical History:   Diagnosis Date    Anxiety     Arthritis     Celiac disease     Depression     GERD (gastroesophageal reflux disease)        Patient Active Problem List   Diagnosis Code    Lumbar radiculopathy M54.16    Attention deficit hyperactivity disorder (ADHD) F90.9    Celiac disease K90.0    Gastroesophageal reflux disease K21.9    Psoriasis of scalp L40.9    Bipolar disorder F31.9        Medications:     Medications were reviewed and attested separately in the EMR    Physical Examination:     GENERAL:  Alert and oriented, in no acute distress.  Communicating easily.  Voice quality is within normal limits.   FACE:  The face is atraumatic.     Assessment and Plan:     This is a 63 y.o. female with obstructive sleep apnea and intolerance of CPAP treatment due to inability to keep CPAP on during the night. This patient is a candidate for hypoglossal nerve stimulation based on the following criteria:     AHI: 25  Central + mixed/total events: 3 %  BMI: 20   Absence of complete concentric velopharyngeal collapse on DISE  Absence of contraindications to hypoglossal nerve stimulator implantation   Absence of more  appropriate treatment based on OSA severity and anatomic findings.     The nature of obstructive sleep apnea and the risks of leaving it untreated were reviewed with the patient. CPAP alternative treatment options based on DISE results were reviewed with the patient including their pros and cons, and questions were answered. The nature of treatment with hypoglossal nerve stimulation including the surgical procedure, recovery and treatment expectations were reviewed with the patient. She will think about this and let us know. Insurance authorization for the procedure will then be requested on the patient's behalf.       This is a moderate complexity medical decision making encounter.     I appreciate the opportunity to be involved in the care of this patient.     Sheliah Mends, MD

## 2023-06-29 NOTE — Progress Notes (Signed)
[facility]rem[facility]  PT Initial Evaluation    History  Encounter Diagnosis   Name Primary?    Right lateral epicondylitis Yes     Cecile Hearing, DO  [address]  [address]  [place],  [place] [address]    Onset date: October 2024      Subjective    Gail Jensen is a 63 y.o. female who is present today for right, forearm, right, elbow pain.  Mechanism of injury/history of symptoms: Repetitive strain; patient has a series of 2 injections coming up on 07/02/23 and 07/08/23 with another follow-up with Dr. Deretha Emory scheduled on 07/28/23    Pt has received care for 0 visits to date.    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: [employer]      Symptom location: Lateral, right  Relevant symptoms: Aching, Burning, Numbness, Throbbing, Pain   Symptom frequency: Persistent  Symptom intensity (0 - 10 scale): Now 4 Worst 8    Night Pain: No        Morning Pain/Stiffness: Deferred at this time   Symptoms worsen with: Lifting, Carrying, Gripping, typing, elbow extension  Symptoms improve with: Rest  Assistive device:  Splint/brace  Patient's goals for therapy: Reduce pain, Achieve independence with home program for self care / condition management     Objective:    Observation: Unremarkable  Sensibility Screen:  complaints of intermittent numbness dorsal right LF/RF/SF    Palpation: Tenderness, right lateral epicondyle > mobile wad  Scar:  NA      ROM    UE AROM    Right   Elbow    Flexion WNL   Extension WNL *pain   Pronation WNL   Supination  WNL         UE AROM    Right   Wrist    Flexion WNL   Extension WNL       Hand dominance:  right         Elbow:  Lateral Epicondylitis,  positive       Assessment:  Findings consistent with 63 y.o., female with   Encounter Diagnosis   Name Primary?    Right lateral epicondylitis Yes     with pain, functional limitations.    Personal factors affecting  treatment/recovery:  none identified  Comorbidities affecting treatment/recovery:  none noted  Clinical presentation:  stable  Patient complexity:    low level as indicated by above stability of condition, personal factors, environmental factors and comorbidities in addition to patient symptom presentation and impairments found on physical exam.    Prognosis:  Good   Contraindications/Precautions/Limitation:  Per diagnosis  Short Term Goals:  Decrease c/o max pain to < 4/10 and Minimal assistance with HEP/ education concepts  Long Term Goals:  Pain/Sx 0 - minimal, Independent with HEP/education , Functional return to ADLs / activities without limitations  Recommend medically necessary rehabilitation to facilitate improvement and/or functional ability to use right UE for self-care, ADLs, and house chores without limitation.    Treatment Plan:   Patient/family involved in developing goals and treatment plan: Yes  Expected length of care 8 week(s)  Freq 1 times per week for 8 week    Treatment plan inclusive of:   Exercise: AROM, Stretching, Strengthening   Modalities:  Cold pack, Moist heat, Ther Exercise per flowsheet      Request additional:  8 visits for therapy.  Thank you for the referral of this patient to [facility]. Please do not hesitate to contact me if I may be of assistance.      Please submit written approval or fax [phone]    Mikela Senn, PT,DPT,CHT      MH X       Wrist extensor stretch 3x   Radial nerve glides 5x       Eccentric wrist extension 10x/5x

## 2023-06-30 ENCOUNTER — Ambulatory Visit
Payer: Commercial Managed Care - PPO | Attending: Physical Medicine and Rehabilitation | Admitting: Rehabilitative and Restorative Service Providers"

## 2023-06-30 ENCOUNTER — Other Ambulatory Visit: Payer: Self-pay

## 2023-06-30 DIAGNOSIS — M7711 Lateral epicondylitis, right elbow: Secondary | ICD-10-CM | POA: Insufficient documentation

## 2023-07-01 ENCOUNTER — Other Ambulatory Visit: Payer: Self-pay

## 2023-07-02 ENCOUNTER — Encounter: Payer: Self-pay | Admitting: Physical Medicine and Rehabilitation

## 2023-07-02 ENCOUNTER — Other Ambulatory Visit: Payer: Commercial Managed Care - PPO

## 2023-07-02 ENCOUNTER — Encounter: Payer: Self-pay | Admitting: Obstetrics and Gynecology

## 2023-07-02 ENCOUNTER — Other Ambulatory Visit: Payer: Self-pay

## 2023-07-02 ENCOUNTER — Ambulatory Visit: Payer: Commercial Managed Care - PPO | Admitting: Physical Medicine and Rehabilitation

## 2023-07-02 VITALS — BP 130/74 | HR 82 | Ht 68.0 in | Wt 132.0 lb

## 2023-07-02 DIAGNOSIS — M7710 Lateral epicondylitis, unspecified elbow: Secondary | ICD-10-CM

## 2023-07-02 MED ORDER — TRIAMCINOLONE ACETONIDE 40 MG/ML IJ SUSP *I*
40.0000 mg | Freq: Once | INTRAMUSCULAR | Status: AC | PRN
Start: 2023-07-02 — End: 2023-07-02
  Administered 2023-07-02: 40 mg

## 2023-07-02 MED ORDER — LIDOCAINE HCL 1% IJ SOLN (PF) (AMB) *I*
2.0000 mL | Freq: Once | INTRAMUSCULAR | Status: AC | PRN
Start: 2023-07-02 — End: 2023-07-02
  Administered 2023-07-02: 2 mL

## 2023-07-02 NOTE — Telephone Encounter (Signed)
Please see patient message, having bleeding on HRT

## 2023-07-02 NOTE — Procedures (Signed)
Date/Time: 07/02/2023  2:45 PM EST  Tendons, Ligaments, Muscles, and Joint/Bursa Injection(s): Medium Joint w/USG - CPT 20606  Laterality: Right  Anesthetic medications - Right: 2 mL Lidocaine HCl (PF) 1 %  Steroid medication - Right: 40 mg triamcinolone acetonide 40 MG/ML    HPI    Patient reports elbow pain.  They have been evaluated and recommended for common extensor tendon injection.  Please refer to note from  for additional details.  They present for injection and report ongoing symptoms.    EXAM  No signs of infection of the skin overlying the right elbow.  NMSK - RUL strength and sensation stable pre- & post-procedure.     ULTRASOUND MACHINE & TRANSDUCER  GE P9 Linear array with a frequency of 9-12 MHz     PROCEDURE  Common Extensor Tendon Injection, 20606    INJECTATE:   -A total of 3ml, consisting of 1ml of triamcinolone (40mg /ml) and the remainder of 1% Lidocaine.    DESCRIPTION:    The risks, benefits, prognosis and alternative treatment options were discussed.  Verbal consent was obtained.  The skin overlying the target was cleaned with ChloraPrep.  The target was identified with ultrasound, and after applying vapocoolant spray, a 25g needle was advanced to the target site using a posterior, in-plane approach with ultrasound guidance.  After negative aspiration, a total of 3ml of the above solution was injected.  The patient tolerated the procedure well.    ULTRASOUND FINDINGS:   None.      ASSESSMENT  Common Extensor Tendinosis    PLAN  -Follow up in 2 weeks and continue plan of care as per last OV.

## 2023-07-02 NOTE — Telephone Encounter (Signed)
I would have her schedule for a sonohysterogram. Has she missed any progesterone or any change in her HRT use pattern? Thank You

## 2023-07-04 ENCOUNTER — Other Ambulatory Visit: Payer: Self-pay

## 2023-07-05 ENCOUNTER — Other Ambulatory Visit: Payer: Self-pay | Admitting: Obstetrics and Gynecology

## 2023-07-05 ENCOUNTER — Ambulatory Visit: Payer: Commercial Managed Care - PPO | Admitting: Rehabilitative and Restorative Service Providers"

## 2023-07-05 ENCOUNTER — Other Ambulatory Visit: Payer: Self-pay

## 2023-07-05 DIAGNOSIS — N950 Postmenopausal bleeding: Secondary | ICD-10-CM

## 2023-07-05 DIAGNOSIS — M7711 Lateral epicondylitis, right elbow: Secondary | ICD-10-CM

## 2023-07-05 NOTE — Progress Notes (Signed)
[facility] Orthopedic Hand / Upper Extremity Rehabilitation Treatment Note    Today's Date: 07/05/2023    Name: Gail Jensen  DOB: 11-05-1960  Referring Physician: Cecile Hearing, DO  Diagnosis:   1. Right lateral epicondylitis            Visit #: Visit count could not be calculated. Make sure you are using a visit which is associated with an episode.    Subjective:    Patient had her first injection on Friday       Objective:  Strength - Therapeutic Exercises per flowsheet  Function: -  stable  Education:  Updated HEP, Verbal cues for ther ex      Treatment:  Moist heat, Ther Exercise per flowsheet    Assessment:   Therex progressed as able this session; HEP updated.       Plan of Care:  Continue per Plan of care -  As written; Patient would benefit from skilled rehabilitation services to address the above impairments to restore functional capacity.    Thank you for referring this patient to [facility] Orthopaedics - Hand and Upper Extremity Rehabilitation    Aimi Essner, PT,DPT,CHT    MH X       Wrist extensor stretch 3x (performed as needed throughout the session)   Radial nerve glides 5x       Eccentric wrist extension 10x   Wrist flexion 10x   RD/UD 10x

## 2023-07-06 ENCOUNTER — Ambulatory Visit: Payer: Commercial Managed Care - PPO | Admitting: Orthopedic Surgery

## 2023-07-07 ENCOUNTER — Ambulatory Visit: Payer: Commercial Managed Care - PPO | Admitting: Otolaryngology

## 2023-07-07 ENCOUNTER — Other Ambulatory Visit: Payer: Self-pay

## 2023-07-08 ENCOUNTER — Ambulatory Visit: Payer: Commercial Managed Care - PPO | Admitting: Obstetrics and Gynecology

## 2023-07-08 ENCOUNTER — Ambulatory Visit: Payer: Commercial Managed Care - PPO | Admitting: Physical Medicine and Rehabilitation

## 2023-07-08 VITALS — BP 100/60 | Ht 68.4 in | Wt 136.4 lb

## 2023-07-08 DIAGNOSIS — N950 Postmenopausal bleeding: Secondary | ICD-10-CM

## 2023-07-08 DIAGNOSIS — N939 Abnormal uterine and vaginal bleeding, unspecified: Secondary | ICD-10-CM

## 2023-07-08 MED ORDER — TESTOSTERONE POWD
0 refills | Status: DC
Start: 2023-07-08 — End: 2023-07-15

## 2023-07-08 MED ORDER — PROGESTERONE 100 MG PO CAPS *I*
ORAL_CAPSULE | ORAL | 1 refills | Status: DC
Start: 2023-07-08 — End: 2023-08-11

## 2023-07-08 NOTE — Progress Notes (Signed)
 The day of the patient's visit I reviewed her entire electronic chart including medications, problems, and last visit clinical notes.   Gail Jensen is a 63 y.o. female here for AUB and sonohystogram. .    Her specific menopause complaints are see below     Gynecologic History:  No LMP recorded. Patient is postmenopausal.     OB History    No obstetric history on file.         Patient Active Problem List   Diagnosis Code    Lumbar radiculopathy M54.16    Attention deficit hyperactivity disorder (ADHD) F90.9    Celiac disease K90.0    Gastroesophageal reflux disease K21.9    Psoriasis of scalp L40.9    Bipolar disorder F31.9    Asthma J45.909       Allergies   Allergen Reactions    Ciprofloxacin Hives    Sulfa Antibiotics Hives     hives    Sulfasalazine Other (See Comments)     hives    Wheat Other (See Comments)     Celiac disease       Current Outpatient Medications   Medication    sertraline (ZOLOFT) 100 mg tablet    traZODone (DESYREL) 150 mg tablet    ESTRADIOL 0.05 MG/24HR twice-weekly patch    progesterone (PROMETRIUM) 200 MG capsule    generic DME    methocarbamol (ROBAXIN) 500 mg tablet    melatonin 3 mg    famotidine (PEPCID) 40 mg tablet    valACYclovir (VALTREX) 500 mg tablet    lamoTRIgine (LAMICTAL) 100 mg tablet    amphetamine-dextroamphetamine (ADDERALL XR) 10 mg 24 hr capsule    clobetasol (TEMOVATE) 0.05 % external solution    NONFORMULARY, OTHER, ORDER *I*    Multiple Vitamin (DAILY-VITE MULTIVITAMIN) TABS    progesterone (PROMETRIUM) 100 MG capsule    testosterone transdermal gel *compound*    RA MELATONIN 3-2 MG TABS    ibuprofen (ADVIL,MOTRIN) 800 mg tablet    lidocaine (XYLOCAINE) 5 % ointment     No current facility-administered medications for this visit.       ROS:  Per HPI  CONSTITUTIONAL: Appetite good, no fevers, night sweats or weight loss  GI: No nausea/vomiting, abdominal pain, or change in bowel habits  GU: No dysuria, urgency or incontinence  PSYCH: No depression or  anxiety    Objective:  BP 100/60 (BP Location: Right arm, Patient Position: Sitting, Cuff Size: adult)   Ht 1.737 m (5' 8.4")   Wt 61.9 kg (136 lb 6.4 oz)   BMI 20.50 kg/m      GENERAL: 63 y.o. year y/o female in NAD.    MENTAL STATUS: Alert, normal MS. Answers all questions appropriately.      Amy  was present in the exam room while the ultrasound  exam was performed.    This 63 year old female presents today in the office with a history of intermittent spotting.  She is currently on an estradiol patch at 0.05 mg for 24 hours but also 200 mg of progesterone for 2 weeks out of every 4 weeks.    The results of the ultrasound can be seen under the final report.  In brief this sonohysterogram was carried out without difficulty demonstrating a thin endometrium.  The uterus appears normal and left ovary was not visualized but the right ovary appears normal.    The patient was reassured of these findings.  She notes however that even on the estradiol that she has  significant lack of energy and libido.  We discussed other options and she is selected to try a 29-month window of 2 mg daily progesterone cream.  I also switched her to 100 mg of micronized progesterone each night.  A blood level for testosterone will be drawn in 1 month.Marland Kitchen

## 2023-07-13 ENCOUNTER — Encounter: Payer: Self-pay | Admitting: Otolaryngology

## 2023-07-15 ENCOUNTER — Other Ambulatory Visit: Payer: Self-pay | Admitting: Obstetrics and Gynecology

## 2023-07-15 NOTE — Telephone Encounter (Signed)
 Dr. Joseph Art, you saw this patient on 07/08/23 and prescribed testosterone. The patient mailed [facility] a copy of her license but forgot to mail the actual prescription (it is still in her purse and will expire by the time it gets to them). Could you please re-print this and we will mail to [facility]. Thanks

## 2023-07-16 ENCOUNTER — Ambulatory Visit: Payer: Commercial Managed Care - PPO | Admitting: Rehabilitative and Restorative Service Providers"

## 2023-07-19 ENCOUNTER — Encounter: Payer: Self-pay | Admitting: Obstetrics and Gynecology

## 2023-07-19 MED ORDER — TESTOSTERONE POWD
0 refills | Status: DC
Start: 2023-07-19 — End: 2024-03-23

## 2023-07-19 NOTE — Telephone Encounter (Signed)
 Mailed script to [facility] for [employer]

## 2023-07-19 NOTE — Telephone Encounter (Signed)
 Hi Dr. Joseph Art, would you be able to print this out today, and I will mail to [facility]. Thanks!

## 2023-07-21 ENCOUNTER — Other Ambulatory Visit: Payer: Self-pay

## 2023-07-21 ENCOUNTER — Other Ambulatory Visit: Payer: Self-pay | Admitting: Family Medicine

## 2023-07-21 NOTE — Telephone Encounter (Signed)
 Last office visit:   06/01/2023  Patients upcoming appointments:  Future Appointments   Date Time Provider Department Center   07/22/2023 11:30 AM Cecile Hearing, DO [facility] None   08/11/2023  8:30 AM Cecile Hearing, DO [facility] None   09/06/2023  2:40 PM Saverio Danker, MD [facility] None     Recent Lab results:  GENERAL CHEMISTRY   Recent Labs     06/07/23  1442 06/02/23  1421   NA 140 139   K 4.1 4.5   CL 102 103   CO2 23 27   GAP 15 9   UN 17 12   CREAT 0.87 0.77   GLU 98 98   CA 9.9 9.6      LIPID PROFILE   Recent Labs     06/02/23  1421   CHOL 210*   TRIG 106   HDL 79*   LDLC 110      LIVER PROFILE   Recent Labs     06/02/23  1421   ALT 25   AST 22   ALK 56   TB 0.2      DIABETES THYROID   Recent Labs     06/02/23  1421 09/23/22  1027   HA1C 5.6 5.8*    Recent Labs     11/12/22  1058   TSH 2.08         Pending/Orders Labs:  Lab Frequency Next Occurrence   Echo Complete Once 01/20/2023   Testosterone, Free by Immunoassay (Adult Males or Individuals on Testosterone Hormone Therapy) Once 07/08/2023

## 2023-07-22 ENCOUNTER — Encounter: Payer: Self-pay | Admitting: Physical Medicine and Rehabilitation

## 2023-07-22 ENCOUNTER — Other Ambulatory Visit: Payer: Self-pay | Admitting: Family Medicine

## 2023-07-22 ENCOUNTER — Ambulatory Visit: Payer: Commercial Managed Care - PPO | Admitting: Physical Medicine and Rehabilitation

## 2023-07-22 VITALS — BP 132/81 | HR 82 | Ht 68.0 in | Wt 133.0 lb

## 2023-07-22 DIAGNOSIS — M792 Neuralgia and neuritis, unspecified: Secondary | ICD-10-CM

## 2023-07-22 MED ORDER — LIDOCAINE HCL 1% IJ SOLN (PF) (AMB) *I*
2.0000 mL | Freq: Once | INTRAMUSCULAR | Status: AC | PRN
Start: 2023-07-22 — End: 2023-07-22
  Administered 2023-07-22: 2 mL

## 2023-07-22 MED ORDER — TRIAMCINOLONE ACETONIDE 40 MG/ML IJ SUSP *I*
40.0000 mg | Freq: Once | INTRAMUSCULAR | Status: AC | PRN
Start: 2023-07-22 — End: 2023-07-22
  Administered 2023-07-22: 40 mg

## 2023-07-22 NOTE — Procedures (Signed)
 Date/Time: 07/22/2023 11:15 AM EST  Nerve and Sympathetic Blocks: Other Peripheral Nerve Block - CPT [id number]  Imaging guidance: Ultrasound guidance - CPT [phone]  Laterality: Right  Anesthetic medications - Right: 2 mL Lidocaine HCl (PF) 1 %  Steroid medication - Right: 40 mg triamcinolone acetonide 40 MG/ML    HPI    Patient reports right arm pain.  They have been evaluated and recommended for posterior interosseous nerve hydrodissection.  Please refer to note from DATE for additional details.  They present for injection and report improvement in proximal symptoms from right CET injection and ongoing distal symptoms.    EXAM  No signs of infection of the skin overlying RUL.  NMSK - RUL wrist extension decreased post procedure, sensation stable pre- & post-procedure.     ULTRASOUND MACHINE & TRANSDUCER  GE P9 Linear array with a frequency of 9-12 MHz     PROCEDURE  Right Posterior Interosseous Nerve Injection, [id number] & [id number]    INJECTATE:   -A total of 1ml of 1% lidocaine was used for soft tissue.  -A total of 3ml, consisting of 1ml of triamcinolone (40mg /ml) and the remainder of 1% Lidocaine.    DESCRIPTION:    The risks, benefits, prognosis and alternative treatment options were discussed.  Verbal consent was obtained.  The skin overlying the target was cleaned with ChloraPrep.  The target was identified with ultrasound, and after applying vapocoolant spray, a 25g needle was used to deliver the above anesthetic into the soft tissue overlying the target. Then a 25g needle was advanced to the target site using a posterior, in-plane approach with ultrasound guidance.  After negative aspiration, a total of 3ml of the above solution was injected.  The patient tolerated the procedure well.    ULTRASOUND FINDINGS:   None.      ASSESSMENT  Neuralgia     PLAN  -Instructed to monitor and call tomorrow if no improvement.  Follow up in 2 weeks and continue plan of care as per last OV.

## 2023-07-22 NOTE — Telephone Encounter (Signed)
 Last office visit:   06/01/2023  Patients upcoming appointments:  Future Appointments   Date Time Provider Department Center   07/22/2023 11:30 AM Cecile Hearing, DO [facility] None   08/11/2023  8:30 AM Cecile Hearing, DO [facility] None   09/06/2023  2:40 PM Saverio Danker, MD [facility] None     Recent Lab results:  GENERAL CHEMISTRY   Recent Labs     06/07/23  1442 06/02/23  1421   NA 140 139   K 4.1 4.5   CL 102 103   CO2 23 27   GAP 15 9   UN 17 12   CREAT 0.87 0.77   GLU 98 98   CA 9.9 9.6      LIPID PROFILE   Recent Labs     06/02/23  1421   CHOL 210*   TRIG 106   HDL 79*   LDLC 110      LIVER PROFILE   Recent Labs     06/02/23  1421   ALT 25   AST 22   ALK 56   TB 0.2      DIABETES THYROID   Recent Labs     06/02/23  1421 09/23/22  1027   HA1C 5.6 5.8*    Recent Labs     11/12/22  1058   TSH 2.08         Pending/Orders Labs:  Lab Frequency Next Occurrence   Echo Complete Once 01/20/2023   Testosterone, Free by Immunoassay (Adult Males or Individuals on Testosterone Hormone Therapy) Once 07/08/2023

## 2023-07-22 NOTE — Telephone Encounter (Signed)
 [facility] First Name Last Name Birth Date Gender Street Address [place] Zip Code   A Bobbie Virden [name] 23-Oct-1960 Female [address] [place] [address]   Tacy Chavis [name] March 22, 1961 Female [address] [place] [address]        Search:  [facility] My Rx Current Rx Drug Type Rx Written Rx Dispensed Drug Quantity Days Supply Prescriber Name Prescriber [facility] # Payment Method Dispenser   B N N S 05/28/2023 06/02/2023 dextroamp-amphet er 10 mg cap 30 30 Saverio Danker [id number] [facility] [phone]   A N N S 11/25/2022 12/01/2022 dextroamp-amphetamin 30 mg tab 30 30 Dickie La [id number] [facility] [phone]   A N N S 10/06/2022 10/08/2022 dextroamp-amphetamin 30 mg tab 30 30 Dickie La [id number] [facility] [phone]   A N N S 07/27/2022 07/31/2022 dextroamp-amphetamin 30 mg tab 30 30 Dickie La [id number] [facility] [phone]   A N N O 07/23/2022 07/24/2022 hydrocodone-acetaminophen 5-325 mg tablet 6 2 Reuel Boom (DDS, MD) [id number] [facility] [phone]     Showing 1 to 5 of 5 entries

## 2023-07-28 ENCOUNTER — Other Ambulatory Visit
Admission: RE | Admit: 2023-07-28 | Discharge: 2023-07-28 | Disposition: A | Discharge status: Home / Self Care | Payer: Commercial Managed Care - PPO | Source: Ambulatory Visit | Attending: Obstetrics and Gynecology | Admitting: Obstetrics and Gynecology

## 2023-07-28 ENCOUNTER — Ambulatory Visit: Payer: Commercial Managed Care - PPO | Admitting: Physical Medicine and Rehabilitation

## 2023-07-28 ENCOUNTER — Encounter: Payer: Self-pay | Admitting: Otolaryngology

## 2023-07-28 DIAGNOSIS — N950 Postmenopausal bleeding: Secondary | ICD-10-CM | POA: Insufficient documentation

## 2023-07-28 LAB — SEX HORMONE BINDING GLOBULIN: Sex Hormone Binding Glob: 68 nmol/L (ref 20–130)

## 2023-07-28 LAB — TESTOSTERONE, FREE BY IMMUNOASSAY (ADULT MALES OR INDIVIDUALS ON TESTOSTERONE HORMONE THERAPY): Testosterone: <12 ng/dL (ref 0–41)

## 2023-08-05 ENCOUNTER — Encounter: Payer: Self-pay | Admitting: Family Medicine

## 2023-08-08 ENCOUNTER — Encounter: Payer: Self-pay | Admitting: Family Medicine

## 2023-08-09 ENCOUNTER — Telehealth: Payer: Self-pay

## 2023-08-09 NOTE — Telephone Encounter (Signed)
 Surgery with Dr. Wynn Maudlin on 10/01/23. CPT code 13086 does not require an authorization with [employer] per email from [email or web address] .

## 2023-08-10 ENCOUNTER — Encounter: Payer: Self-pay | Admitting: Obstetrics and Gynecology

## 2023-08-10 NOTE — Telephone Encounter (Signed)
 Patient last seen 07/08/23 for sonohysterogram. Switched to Prometrium 100 mg at that time and to continue estradiol 0.05 mg twice weekly patch. Please see patient message with update, thank you

## 2023-08-10 NOTE — Telephone Encounter (Signed)
 I would recommend an endometrial biopsy given ongoing bleeding and likely a decrease in estradiol dosage or a change in progesterone type is needed, I would recommend trying a different progesterone, given her symptoms of night sweats.Could you check on her pharmacy and I will call over medroxyprogesterone for her. Thank You!

## 2023-08-11 ENCOUNTER — Ambulatory Visit: Payer: Commercial Managed Care - PPO | Admitting: Physical Medicine and Rehabilitation

## 2023-08-11 ENCOUNTER — Other Ambulatory Visit: Payer: Self-pay | Admitting: Obstetrics and Gynecology

## 2023-08-11 DIAGNOSIS — N950 Postmenopausal bleeding: Secondary | ICD-10-CM

## 2023-08-11 MED ORDER — PROGESTERONE 100 MG PO CAPS *I*
ORAL_CAPSULE | ORAL | 1 refills | Status: DC
Start: 2023-08-11 — End: 2023-08-13

## 2023-08-11 NOTE — Telephone Encounter (Signed)
 Patient asks that the medroxyprogesterone script is sent to her [facility] on [address]. Wonders if the increased estradiol patch dose could be the cause of the bleeding, but looks like the dose was increased back in September.

## 2023-08-11 NOTE — Progress Notes (Signed)
Progesterone sent.

## 2023-08-13 MED ORDER — MEDROXYPROGESTERONE ACETATE 2.5 MG PO TABS *I*
2.5000 mg | ORAL_TABLET | Freq: Every day | ORAL | 2 refills | Status: DC
Start: 2023-08-13 — End: 2023-10-29

## 2023-08-13 NOTE — Telephone Encounter (Signed)
 OK, I've sent that over for [facility]. Thank You!

## 2023-08-13 NOTE — Addendum Note (Signed)
 Addended by: Verlin Grills on: 08/13/2023 09:24 AM     Modules accepted: Orders

## 2023-08-22 ENCOUNTER — Other Ambulatory Visit: Payer: Self-pay | Admitting: Family Medicine

## 2023-08-23 NOTE — Telephone Encounter (Signed)
 Last office visit:   06/01/2023  Patients upcoming appointments:  Future Appointments   Date Time Provider Department Center   09/06/2023  2:40 PM Saverio Danker, MD [facility] None   09/28/2023  3:20 PM Sheliah Mends, MD HNT None   10/11/2023  3:00 PM Theodosia Blender, PA ENT None   10/26/2023  2:45 PM Emelda Fear, PA [facility] None     Recent Lab results:  GENERAL CHEMISTRY   Recent Labs     06/07/23  1442 06/02/23  1421   NA 140 139   K 4.1 4.5   CL 102 103   CO2 23 27   GAP 15 9   UN 17 12   CREAT 0.87 0.77   GLU 98 98   CA 9.9 9.6      LIPID PROFILE   Recent Labs     06/02/23  1421   CHOL 210*   TRIG 106   HDL 79*   LDLC 110      LIVER PROFILE   Recent Labs     06/02/23  1421   ALT 25   AST 22   ALK 56   TB 0.2      DIABETES THYROID   Recent Labs     06/02/23  1421 09/23/22  1027   HA1C 5.6 5.8*    Recent Labs     11/12/22  1058   TSH 2.08         Pending/Orders Labs:  Lab Frequency Next Occurrence   Echo Complete Once 01/20/2023

## 2023-08-27 ENCOUNTER — Telehealth: Payer: Self-pay | Admitting: Otolaryngology

## 2023-08-27 NOTE — Telephone Encounter (Signed)
 Scheduled for her Inspire implant with Dr. Wynn Maudlin at [facility] on 10/01/23. Pre-op/Post-op instructions have been sent to patient via MyChart. Will postpone for nursing follow up.

## 2023-09-01 ENCOUNTER — Other Ambulatory Visit: Payer: Self-pay | Admitting: Obstetrics and Gynecology

## 2023-09-01 NOTE — Telephone Encounter (Signed)
 Patient's pharmacy requesting a refill of estradiol 0.05 mg twice weekly patch. Last seen 07/08/23 for sonohysterogram for AUB, switched to medroxyprogesterone 2.5 mg at that time. Pended if ok, thank you

## 2023-09-05 ENCOUNTER — Other Ambulatory Visit: Payer: Self-pay

## 2023-09-06 ENCOUNTER — Encounter: Payer: Self-pay | Admitting: Family Medicine

## 2023-09-06 ENCOUNTER — Ambulatory Visit: Payer: Commercial Managed Care - PPO | Admitting: Family Medicine

## 2023-09-06 ENCOUNTER — Other Ambulatory Visit
Admission: RE | Admit: 2023-09-06 | Discharge: 2023-09-06 | Disposition: A | Discharge status: Home / Self Care | Source: Ambulatory Visit | Attending: Family Medicine | Admitting: Family Medicine

## 2023-09-06 VITALS — BP 119/79 | HR 74 | Wt 135.3 lb

## 2023-09-06 DIAGNOSIS — G4700 Insomnia, unspecified: Secondary | ICD-10-CM

## 2023-09-06 DIAGNOSIS — Z87898 Personal history of other specified conditions: Secondary | ICD-10-CM | POA: Insufficient documentation

## 2023-09-06 DIAGNOSIS — N399 Disorder of urinary system, unspecified: Secondary | ICD-10-CM | POA: Insufficient documentation

## 2023-09-06 DIAGNOSIS — Z1211 Encounter for screening for malignant neoplasm of colon: Secondary | ICD-10-CM

## 2023-09-06 DIAGNOSIS — N939 Abnormal uterine and vaginal bleeding, unspecified: Secondary | ICD-10-CM

## 2023-09-06 DIAGNOSIS — Z1231 Encounter for screening mammogram for malignant neoplasm of breast: Secondary | ICD-10-CM

## 2023-09-06 DIAGNOSIS — Z0000 Encounter for general adult medical examination without abnormal findings: Secondary | ICD-10-CM

## 2023-09-06 LAB — CBC AND DIFFERENTIAL
Baso # K/uL: 0.1 10*3/uL (ref 0.0–0.2)
Eos # K/uL: 0.2 10*3/uL (ref 0.0–0.5)
Hematocrit: 41 % (ref 34–49)
Hemoglobin: 13.2 g/dL (ref 11.2–16.0)
IMM Granulocytes #: 0 10*3/uL (ref 0.0–0.0)
IMM Granulocytes: 0.2 %
Lymph # K/uL: 1.8 10*3/uL (ref 1.0–5.0)
MCV: 92 fL (ref 75–100)
Mono # K/uL: 0.4 10*3/uL (ref 0.1–1.0)
Neut # K/uL: 4.1 10*3/uL (ref 1.5–6.5)
Nucl RBC # K/uL: 0 10*3/uL (ref 0.0–0.0)
Nucl RBC %: 0 /100{WBCs} (ref 0.0–0.2)
Platelets: 297 10*3/uL (ref 150–450)
RBC: 4.5 MIL/uL (ref 4.0–5.5)
RDW: 13 % (ref 0.0–15.0)
Seg Neut %: 63 %
WBC: 6.5 10*3/uL (ref 3.5–11.0)

## 2023-09-06 LAB — FERRITIN: Ferritin: 42 ng/mL (ref 10–120)

## 2023-09-06 LAB — TIBC
Iron: 74 ug/dL (ref 34–165)
TIBC: 347 ug/dL (ref 250–450)
Transferrin Saturation: 21 % (ref 15–50)

## 2023-09-06 LAB — TRANSFERRIN: Transferrin: 257 mg/dL (ref 200–360)

## 2023-09-06 NOTE — Progress Notes (Signed)
 [facility]  [address]  [place] [address]  Phone: [phone]  Fax: [phone]     Patient ID: Gail Jensen is a 63 y.o. female.    Chief Complaint   Patient presents with    Annual Exam     HPI    - Fatigue    - Patient reports feeling tired    - Sleep disturbances noted    - Using sleep gummies and trazodone for sleep    - Menstrual irregularities    - Experiencing prolonged menstrual periods (14 days)    - Recurrent in January, February, and March    - Recent ultrasound showed normal uterine lining    - Polyps and endometrial thickness noted on ultrasound (total 4.3)    - Currently using hormone patches, considering adjusting frequency    - Hormone therapy    - Using estradiol, progesterone, and testosterone via Obgyn specialist    - No noticeable side effects like extra hair growth or acne    - Sleep apnea    - Scheduled for Inspire device implantation    - Reports poor sleep quality (50%)    - Mentions coughing and possible breathing pauses during sleep    - ADHD   stable on current aderall dose    Social History  - 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    amphetamine-dextroamphetamine (ADDERALL XR) 10 mg 24 hr capsule take 1 capsule by mouth every morning maximum daily dose of 1 , MAY SPRINKLE CONTENTS OF CAPSULE ON FOOD. DO NOT CHEW CAPSULE    testosterone transdermal gel *compound* Testosterone in  cream base   in a  topiclick    8mg /gm. Apply 1/4 gram (2 mg) to inner thigh daily. Code F.  Maximum daily dose 2mg .    sertraline (ZOLOFT) 100 mg tablet Take 1 tablet (100 mg total) by mouth daily.    traZODone (DESYREL) 150 mg tablet Take 1 tablet (150 mg total) by mouth nightly.    generic DME Item to dispense: wrist brace  Instructions for use: use for activities involving the right upper limb.    methocarbamol (ROBAXIN) 500 mg tablet take 1-2 tablets q8hrs prn muscle spasm    RA MELATONIN 3-2 MG TABS Take 1 tablet by mouth nightly as needed.    melatonin 3 mg Take 1 tablet (3 mg total) by mouth nightly as needed for Sleep for Trouble Sleeping.    valACYclovir (VALTREX) 500 mg tablet Take 1 tablet (500 mg total) by mouth daily for Herpes Simplex Infection.    lamoTRIgine (LAMICTAL) 100 mg tablet Take 1 tablet (100 mg total) by mouth at bedtime for Manic-Depression.    ibuprofen (ADVIL,MOTRIN) 800 mg tablet Take 1 tablet (800 mg total) by mouth 3 times daily as needed for Pain    clobetasol (TEMOVATE) 0.05 % external solution Apply 1 g topically as  needed    lidocaine (XYLOCAINE) 5 % ointment Apply small amount topically ever 6 hours as needed for hemorrhoid pain    NONFORMULARY, OTHER, ORDER *I* 1 each  CPAP    Multiple Vitamin (DAILY-VITE MULTIVITAMIN) TABS Take 1 tablet by mouth daily.         Objective:    BP 119/79   Pulse 74   Wt 61.4 kg (135 lb 4.8 oz)   SpO2 98%   BMI 20.57 kg/m   Pain    09/06/23 1446   PainSc:   0 - No pain      Physical Exam  Vitals and nursing note reviewed.   Constitutional:       General: She is not in acute distress.     Appearance: Normal appearance. She is normal weight. She is not ill-appearing.   HENT:      Head: Normocephalic and atraumatic.      Right Ear: Tympanic membrane, ear canal and external ear normal.      Left Ear: Tympanic membrane, ear canal and external ear normal.      Nose: Nose normal.      Mouth/Throat:      Mouth: Mucous membranes are moist.      Pharynx: Oropharynx is  clear.      Comments: Normal dentition  Eyes:      Extraocular Movements: Extraocular movements intact.      Conjunctiva/sclera: Conjunctivae normal.      Pupils: Pupils are equal, round, and reactive to light.   Cardiovascular:      Rate and Rhythm: Normal rate and regular rhythm.      Heart sounds: Normal heart sounds. No murmur heard.  Pulmonary:      Effort: Pulmonary effort is normal. No respiratory distress.      Breath sounds: Normal breath sounds. No wheezing.   Abdominal:      General: Abdomen is flat. Bowel sounds are normal.      Palpations: Abdomen is soft. There is no mass.      Tenderness: There is no abdominal tenderness.      Hernia: No hernia is present.   Genitourinary:     Comments: deferred  Musculoskeletal:         General: Normal range of motion.      Cervical back: Normal range of motion and neck supple.   Skin:     General: Skin is warm and dry.   Neurological:      General: No focal deficit present.      Mental Status: She is alert and oriented to person, place, and time.      Cranial Nerves: No cranial nerve deficit.      Sensory: No sensory deficit.      Motor: No weakness.      Coordination: Coordination normal.      Gait: Gait normal.      Deep Tendon Reflexes: Reflexes normal.   Psychiatric:         Mood and Affect: Mood normal.         Behavior: Behavior normal.         Thought Content: Thought content normal.         Judgment: Judgment normal.           Assessment:      Gail Jensen 62yo F presented for physical with fatigue, prolonged menstrual periods, and sleep disturbances. She is currently on hormone therapy with estrogen patches, progesterone, and testosterone cream. She reports poor sleep  quality and is scheduled for Inspire device implantation for sleep apnea. Her medical history includes prediabetes, menopause, sleep apnea, lumbar surgeries, cataracts, and a suspected stroke. The plan includes monitoring her hormone therapy, following up on her sleep apnea treatment, and  addressing her sedentary lifestyle with light exercise recommendations.     Plan:      Abnormal Uterine Bleeding  Patient reports recurrent prolonged menstrual periods, with the most recent lasting 14 days. Previous ultrasound showed normal uterine lining. Current hormone therapy includes estrogen patches twice weekly and progesterone 100mg  daily. Patient self-adjusted estrogen patch frequency to once weekly in an attempt to control bleeding.  - Monitor response to self-adjusted estrogen patch regimen  - Consider referral back to OB/GYN if bleeding persists  - Discuss potential need for endometrial biopsy if bleeding continues    Sleep Apnea  Patient is scheduled for Inspire device implantation for sleep apnea treatment. Reports using sleep tracking app, which shows poor sleep quality (50%). Occasionally uses sleep gummies and trazodone for insomnia.  - Follow up with sleep medicine post-Inspire device activation (scheduled in one month)  - Continue current sleep medications as needed  - Monitor sleep quality with tracking app    Menopausal Symptoms  Patient on hormone replacement therapy (HRT) with estrogen patches and daily progesterone. Recently started testosterone cream for energy. No adverse effects noted from testosterone supplementation thus far.  - Continue current HRT regimen  - Monitor for side effects of testosterone cream (e.g., hirsutism, acne)  - Consider transitioning to estrogen-only therapy after age 53 due to increased risks    Preventive Care  Patient due for several routine screenings. Last colonoscopy was at age 63. DEXA scan has not been performed. Mammogram completed last year.  - Order CBC, iron studies, and fasting glucose  - Schedule colonoscopy  - Order DEXA scan  - Ensure mammogram is scheduled annually    Sedentary Lifestyle  Patient reports no current exercise routine. 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 History of lumbar surgeries limiting some activities.  - Encourage  initiation of light strength training with 5-pound weights  - Recommend upper body exercises and modified lower body exercises as tolerated  - Suggest daily walking for cardiovascular health      Orders Placed This Encounter    Mammography screening BILATERAL    Hemoglobin A1c    CBC and differential    TIBC    Ferritin    Transferrin    AMB REFERRAL TO [facility]     There are no Patient Instructions on file for this visit.    Electronically signed by Saverio Danker, MD 09/06/2023 3:17 PM  [facility], Phone: [phone]

## 2023-09-06 NOTE — Patient Instructions (Addendum)
 Ask about estrogen only therapy

## 2023-09-07 ENCOUNTER — Telehealth: Payer: Self-pay | Admitting: Family Medicine

## 2023-09-07 LAB — HEMOGLOBIN A1C: Hemoglobin A1C: 5.7 % — ABNORMAL HIGH

## 2023-09-07 NOTE — Telephone Encounter (Signed)
Left message for patient to call office for result message

## 2023-09-07 NOTE — Telephone Encounter (Signed)
-----   Message from Saverio Danker, MD sent at 09/07/2023 12:51 PM EDT -----  Iron studies are good!  Your blood sugar is still in the prediabetic range so we'll test it again next time I see you. Start adding some resistance training as you're able which should help!

## 2023-09-07 NOTE — Telephone Encounter (Signed)
Patient returned call to the office, message was relayed to the patient.      Patient verbalized understanding.

## 2023-09-23 ENCOUNTER — Encounter: Payer: Self-pay | Admitting: Family Medicine

## 2023-09-23 MED ORDER — AMPHETAMINE-DEXTROAMPHETAMINE 10 MG PO CP24 *I*
10.0000 mg | ORAL_CAPSULE | Freq: Every morning | ORAL | 0 refills | Status: DC
Start: 2023-09-23 — End: 2023-12-06

## 2023-09-23 NOTE — Telephone Encounter (Signed)
 Last office visit:   09/06/2023  Patients upcoming appointments:  Future Appointments   Date Time Provider Department Center   09/28/2023  3:20 PM Sheliah Mends, MD [facility] None   10/11/2023  3:00 PM Theodosia Blender, PA ENT None   11/02/2023  4:00 PM Nadara Eaton, MD [facility] None   09/08/2024  1:00 PM Saverio Danker, MD [facility] None     Recent Lab results:  GENERAL CHEMISTRY   Recent Labs     06/07/23  1442 06/02/23  1421   NA 140 139   K 4.1 4.5   CL 102 103   CO2 23 27   GAP 15 9   UN 17 12   CREAT 0.87 0.77   GLU 98 98   CA 9.9 9.6      LIPID PROFILE   Recent Labs     06/02/23  1421   CHOL 210*   TRIG 106   HDL 79*   LDLC 110      LIVER PROFILE   Recent Labs     06/02/23  1421   ALT 25   AST 22   ALK 56   TB 0.2      DIABETES THYROID   Recent Labs     09/06/23  1543 06/02/23  1421   HA1C 5.7* 5.6    Recent Labs     11/12/22  1058   TSH 2.08         Pending/Orders Labs:  Lab Frequency Next Occurrence   Echo Complete Once 01/20/2023   Mammography screening BILATERAL Once 09/06/2023

## 2023-09-23 NOTE — Telephone Encounter (Signed)
 [facility] My Rx Current Rx Drug Type Rx Written Rx Dispensed Drug Quantity Days Supply Prescriber Name Prescriber [facility] # Payment Method Dispenser   A N Y  07/19/2023 07/30/2023 testosterone micronized powder * 0gm [address] [place] [id number] [facility]   B N N S 07/22/2023 07/23/2023 dextroamp-amphet er 10 mg cap 30 [address] [place], ([place]) [id number] [facility] [id number]   Oletta Lamas 05/28/2023 06/02/2023 dextroamp-amphet er 10 mg cap 30 30 Saverio Danker [id number] [facility] [id number]   Docia Barrier 11/25/2022 12/01/2022 dextroamp-amphetamin 30 mg tab 30 30 Dickie La [id number] [facility] [id number]   C N N S 10/06/2022 10/08/2022 dextroamp-amphetamin 30 mg tab 30 30 Dickie La [id number] [facility] [id number]     Showing 1 to 5 of 5 entries

## 2023-09-24 ENCOUNTER — Other Ambulatory Visit: Payer: Self-pay | Admitting: Gastroenterology

## 2023-09-27 ENCOUNTER — Other Ambulatory Visit: Payer: Self-pay

## 2023-09-27 ENCOUNTER — Encounter: Payer: Self-pay | Admitting: Otolaryngology

## 2023-09-28 ENCOUNTER — Ambulatory Visit: Payer: Commercial Managed Care - PPO | Admitting: Otolaryngology

## 2023-09-28 ENCOUNTER — Encounter: Payer: Self-pay | Admitting: Otolaryngology

## 2023-09-28 DIAGNOSIS — G4733 Obstructive sleep apnea (adult) (pediatric): Secondary | ICD-10-CM

## 2023-09-28 NOTE — Telephone Encounter (Signed)
Patient following up on MyChart message

## 2023-09-29 NOTE — Progress Notes (Signed)
 Patient did not log on at the time of her appointment

## 2023-09-29 NOTE — Telephone Encounter (Signed)
See separate encounter where this was addressed.

## 2023-09-30 ENCOUNTER — Telehealth: Payer: Self-pay

## 2023-09-30 NOTE — Telephone Encounter (Signed)
 Spoke to patient and she is going to contact PCP for home test

## 2023-09-30 NOTE — Telephone Encounter (Signed)
 Copied from CRM [id number]. Topic: Appointments - Schedule Appointment  >> Sep 30, 2023 12:03 PM Gail Jensen wrote:  The patient is calling to schedule her colonoscopy per the referral from 05/28/23.      Patient was transferred to [facility].

## 2023-10-01 ENCOUNTER — Ambulatory Visit: Admission: RE | Admit: 2023-10-01 | Payer: Self-pay | Source: Ambulatory Visit | Admitting: Otolaryngology

## 2023-10-01 ENCOUNTER — Encounter: Admission: RE | Payer: Self-pay | Source: Ambulatory Visit

## 2023-10-01 SURGERY — INSERTION, HYPOGLOSSAL NERVE STIMULATOR
Anesthesia: General | Laterality: Right

## 2023-10-11 ENCOUNTER — Ambulatory Visit: Payer: Commercial Managed Care - PPO | Admitting: Otolaryngology

## 2023-10-26 ENCOUNTER — Ambulatory Visit: Admitting: Sleep Medicine

## 2023-10-26 ENCOUNTER — Ambulatory Visit: Payer: Commercial Managed Care - PPO

## 2023-10-28 ENCOUNTER — Encounter: Payer: Self-pay | Admitting: Family Medicine

## 2023-10-28 ENCOUNTER — Other Ambulatory Visit: Payer: Self-pay

## 2023-10-29 ENCOUNTER — Other Ambulatory Visit
Admission: RE | Admit: 2023-10-29 | Discharge: 2023-10-29 | Disposition: A | Discharge status: Home / Self Care | Source: Ambulatory Visit | Attending: Family Medicine | Admitting: Family Medicine

## 2023-10-29 ENCOUNTER — Other Ambulatory Visit: Payer: Self-pay | Admitting: Obstetrics and Gynecology

## 2023-10-29 ENCOUNTER — Encounter: Payer: Self-pay | Admitting: Gastroenterology

## 2023-10-29 ENCOUNTER — Ambulatory Visit: Admit: 2023-10-29 | Discharge: 2023-10-29 | Disposition: A | Discharge status: Home / Self Care

## 2023-10-29 ENCOUNTER — Ambulatory Visit: Admitting: Family Medicine

## 2023-10-29 ENCOUNTER — Encounter: Payer: Self-pay | Admitting: Family Medicine

## 2023-10-29 VITALS — BP 97/63 | HR 79 | Temp 97.5°F | Ht 68.0 in | Wt 136.0 lb

## 2023-10-29 DIAGNOSIS — R002 Palpitations: Secondary | ICD-10-CM

## 2023-10-29 DIAGNOSIS — I10 Essential (primary) hypertension: Secondary | ICD-10-CM

## 2023-10-29 LAB — COMPREHENSIVE METABOLIC PANEL
ALT: 26 U/L (ref 0–35)
AST: 22 U/L (ref 0–35)
Albumin: 4.8 g/dL (ref 3.5–5.2)
Alk Phos: 48 U/L (ref 35–105)
Anion Gap: 15 (ref 7–16)
Bilirubin,Total: 0.3 mg/dL (ref 0.0–1.2)
CO2: 23 mmol/L (ref 20–28)
Calcium: 9.3 mg/dL (ref 8.6–10.2)
Chloride: 102 mmol/L (ref 96–108)
Creatinine: 0.93 mg/dL (ref 0.51–0.95)
Glucose: 97 mg/dL (ref 60–99)
Lab: 18 mg/dL (ref 6–20)
Potassium: 4.4 mmol/L (ref 3.3–5.1)
Sodium: 140 mmol/L (ref 133–145)
Total Protein: 7.1 g/dL (ref 6.3–7.7)
eGFR BY CREAT: 70 *

## 2023-10-29 MED ORDER — PROPRANOLOL HCL 10 MG PO TABS *I*
10.0000 mg | ORAL_TABLET | Freq: Two times a day (BID) | ORAL | 1 refills | Status: DC | PRN
Start: 2023-10-29 — End: 2023-12-02

## 2023-10-29 NOTE — Telephone Encounter (Signed)
 Refill request received from patient pharmacy for medroxyPROGESTERone  2.5mg  tablet once daily. Last seen on 07/08/23. Med pended if ok

## 2023-11-01 ENCOUNTER — Telehealth: Payer: Self-pay | Admitting: Family Medicine

## 2023-11-01 NOTE — Progress Notes (Signed)
 [facility]  [facility] Medicine  Subjective   Gail Jensen, [birth year], who presents for Other (Palpitations increasing)   History of Present Illness  The patient presents for evaluation of blood pressure, palpitations, and fatigue.  Patient Active Problem List   Diagnosis    Lumbar radiculopathy    Attention deficit hyperactivity disorder (ADHD)    Celiac disease    Gastroesophageal reflux disease    Psoriasis of scalp    Bipolar disorder    Asthma     She reports a recent increase in her blood pressure, which she attributes to menopause. Menopausal symptoms began at the age of 60, and she is currently on hormone replacement therapy, specifically a patch and progesterone . She has been monitoring her blood pressure at home, with systolic readings ranging from 110 to 160. She recalls an episode on either Tuesday or Wednesday when she felt unwell after returning from work, prompting her to check her blood pressure, which was recorded as 152/80. She is not currently on any antihypertensive medications.    Episodes of palpitations are accompanied by shortness of breath, lightheadedness, and dizziness. These symptoms occur even during periods of rest, such as while driving home from work. She also reports difficulty in achieving full inspiration during these episodes. Her sister, who works in a hospital, has been assisting her in monitoring these symptoms.    She is not currently taking any morning vitamins but has ordered papaya and B12 supplements to help with her fatigue. She is also taking creatine for muscle mass and drinks a smoothie once in the morning.    She has bipolar disorder, EtOH use disorder.    Current Outpatient Medications   Medication Sig    amphetamine -dextroamphetamine  (ADDERALL XR) 10 mg 24 hr capsule Take 1 capsule (10 mg total) by mouth every morning. Max daily dose: 10 mg May sprinkle contents of capsule on food, do not chew capsule.    ESTRADIOL  0.05 MG/24HR twice-weekly patch APPLY 1  PATCH ONTO THE SKIN TWICE A WEEK    famotidine  (PEPCID ) 40 mg tablet TAKE 1 TABLET BY MOUTH NIGHTLY FOR GASTROESOPHAGEAL REFLUX DISEASE    sertraline  (ZOLOFT ) 100 mg tablet Take 1 tablet (100 mg total) by mouth daily.    traZODone  (DESYREL ) 150 mg tablet Take 1 tablet (150 mg total) by mouth nightly.    methocarbamol  (ROBAXIN ) 500 mg tablet take 1-2 tablets q8hrs prn muscle spasm    melatonin 3 mg Take 1 tablet (3 mg total) by mouth nightly as needed for Sleep for Trouble Sleeping.    valACYclovir  (VALTREX ) 500 mg tablet Take 1 tablet (500 mg total) by mouth daily for Herpes Simplex Infection.    lamoTRIgine  (LAMICTAL ) 100 mg tablet Take 1 tablet (100 mg total) by mouth at bedtime for Manic-Depression.    clobetasol (TEMOVATE) 0.05 % external solution Apply 1 g topically as needed    Multiple Vitamin (DAILY-VITE MULTIVITAMIN) TABS Take 1 tablet by mouth daily.    medroxyPROGESTERone  (PROVERA ) 2.5 mg tablet take 1 tablet by mouth once daily    propranolol  (INDERAL ) 10 mg tablet Take 1 tablet (10 mg total) by mouth 2 times daily as needed for Anxiety Related to Current Life Problems.    testosterone  transdermal gel *compound* Testosterone  in  cream base   in a topiclick    8mg /gm. Apply 1/4 gram (2 mg) to inner thigh daily. Code F.  Maximum daily dose 2mg .    generic DME Item to dispense: wrist brace  Instructions for use:  use for activities involving the right upper limb.    RA MELATONIN 3-2 MG TABS Take 1 tablet by mouth nightly as needed.    ibuprofen  (ADVIL ,MOTRIN ) 800 mg tablet Take 1 tablet (800 mg total) by mouth 3 times daily as needed for Pain    lidocaine  (XYLOCAINE ) 5 % ointment Apply small amount topically ever 6 hours as needed for hemorrhoid pain    NONFORMULARY, OTHER, ORDER *I* 1 each  CPAP   \  Objective   Vitals  Blood pressure 97/63, pulse 79, temperature 36.4 C (97.5 F), height 1.727 m (5\' 8" ), weight 61.7 kg (136 lb), SpO2 100%.  Physical Exam  Cardiovascular: Regular rate and rhythm, pulse is  good    Results  Labs   - Lipoprotein a: Low   - Lipid panel: Normal   - A1c: last month, 5.7    Assessment & Plan   Assessment & Plan  1. Blood pressure management.  - Blood pressure readings have been inconsistent, with home measurements indicating hypertension, while clinic readings suggest hypotension.  - Clinic readings show blood pressure at 101/69 and 117/59, while home readings have been as high as 152/80.  - Advised to acquire a new blood pressure cuff for home use and conduct side-by-side comparisons with her current device to ensure accuracy.  - Not advisable to initiate antihypertensive therapy at this time.    2. Palpitations.  - Reports experiencing palpitations, shortness of breath, lightheadedness, and dizziness.  - Physical exam findings include heart rate of 70 and pulse of 81.  - An E-patch will be ordered to monitor her heart rate for a week to check for any arrhythmias.  - If the E-patch does not provide useful information, a referral to cardiology may be considered.    3. Fatigue.  - Reports fatigue and is considering B12 supplements.  - Advised to take B12 supplements every couple of days to help with fatigue.  - Advised to continue taking turmeric but to ensure it is from a reputable source to avoid lead contamination.  - Discussed the importance of a balanced diet with adequate protein intake.    4. Bipolar disorder.  - Advised to avoid creatine supplements due to potential adverse effects on her kidneys and the risk of manic episodes given her bipolar disorder.  - Recommended focusing on maintaining a balanced diet with adequate protein intake.  - Discussed the use of plant-based protein powder or smoothies as alternatives.    5. Lab work.  - A complete metabolic panel (CMP) will be ordered to check her electrolytes and ensure there are no abnormalities affecting her heart.  - Reviewed previous lab results, including lipoprotein a and lipid panel, which were within normal  limits.    Follow-up  - Follow up in 1 month to discuss results and further management.      Author: Hali Leventhal, MD  Note signed: 11/01/2023

## 2023-11-01 NOTE — Progress Notes (Signed)
 Patient was scheduled for 10/29/23 at 8:49 am

## 2023-11-01 NOTE — Telephone Encounter (Signed)
 Electrolytes looked good. Let me know fi you have troubel getting the epatch~ Dr. Agatha Horsfall      This writer spoke with the pt and made her aware of her lab results. Pt stated that she will keep us  posted if she has any other issues regarding e-patch.

## 2023-11-02 ENCOUNTER — Ambulatory Visit: Admitting: Sleep Medicine

## 2023-11-07 ENCOUNTER — Encounter: Payer: Self-pay | Admitting: Family Medicine

## 2023-11-09 MED ORDER — CLOBETASOL PROPIONATE 0.05 % EX SOLN *I*
1.0000 g | CUTANEOUS | 0 refills | Status: AC | PRN
Start: 2023-11-09 — End: ?

## 2023-11-09 NOTE — Telephone Encounter (Signed)
 We don't typically prescribe this    Last office visit:   10/29/2023  Last telehome visit:   03/08/2023  Patients upcoming appointments:  Future Appointments   Date Time Provider Department Center   12/02/2023  2:00 PM Hali Leventhal, MD [facility] None   09/08/2024  1:00 PM Hali Leventhal, MD [facility] None     BP Readings from Last 3 Encounters:   10/29/23 97/63   09/06/23 119/79   07/22/23 132/81      Recent Lab results:  GENERAL CHEMISTRY   Recent Labs     10/29/23  1543 06/07/23  1442 06/02/23  1421   NA 140 140 139   K 4.4 4.1 4.5   CL 102 102 103   CO2 23 23 27    GAP 15 15 9    UN 18 17 12    CREAT 0.93 0.87 0.77   GLU 97 98 98   CA 9.3 9.9 9.6      LIPID PROFILE   Recent Labs     06/02/23  1421   CHOL 210*   TRIG 106   HDL 79*   LDLC 110      LIVER PROFILE   Recent Labs     10/29/23  1543 06/02/23  1421   ALT 26 25   AST 22 22   ALK 48 56   TB 0.3 0.2      DIABETES THYROID    Recent Labs     09/06/23  1543 06/02/23  1421   HA1C 5.7* 5.6    Recent Labs     11/12/22  1058   TSH 2.08         Pending/Orders Labs:  Lab Frequency Next Occurrence   Echo Complete Once 01/20/2023   Mammography screening BILATERAL Once 09/06/2023

## 2023-11-17 ENCOUNTER — Encounter: Payer: Self-pay | Admitting: Obstetrics and Gynecology

## 2023-11-17 ENCOUNTER — Encounter: Payer: Self-pay | Admitting: Family Medicine

## 2023-11-18 ENCOUNTER — Other Ambulatory Visit: Payer: Self-pay | Admitting: Family Medicine

## 2023-11-18 MED ORDER — ESTRADIOL 0.05 MG/24HR TD PTTW *I*
1.0000 | MEDICATED_PATCH | TRANSDERMAL | 0 refills | Status: DC
Start: 2023-11-18 — End: 2024-02-03

## 2023-11-18 MED ORDER — MEDROXYPROGESTERONE ACETATE 2.5 MG PO TABS *I*
2.5000 mg | ORAL_TABLET | Freq: Every day | ORAL | 0 refills | Status: DC
Start: 2023-11-18 — End: 2024-02-03

## 2023-11-18 NOTE — Telephone Encounter (Signed)
 Last office visit:   10/29/2023  Patients upcoming appointments:  Future Appointments   Date Time Provider Department Center   12/02/2023  2:00 PM Hali Leventhal, MD [facility] None   09/08/2024  1:00 PM Hali Leventhal, MD [facility] None     Recent Lab results:  GENERAL CHEMISTRY   Recent Labs     10/29/23  1543 06/07/23  1442 06/02/23  1421   NA 140 140 139   K 4.4 4.1 4.5   CL 102 102 103   CO2 23 23 27    GAP 15 15 9    UN 18 17 12    CREAT 0.93 0.87 0.77   GLU 97 98 98   CA 9.3 9.9 9.6      LIPID PROFILE   Recent Labs     06/02/23  1421   CHOL 210*   TRIG 106   HDL 79*   LDLC 110      LIVER PROFILE   Recent Labs     10/29/23  1543 06/02/23  1421   ALT 26 25   AST 22 22   ALK 48 56   TB 0.3 0.2      DIABETES THYROID    Recent Labs     09/06/23  1543 06/02/23  1421   HA1C 5.7* 5.6    No value within the past 365 days      Pending/Orders Labs:  Lab Frequency Next Occurrence   Echo Complete Once 01/20/2023   Mammography screening BILATERAL Once 09/06/2023        Opioid Drug Screen:      Lab results: 05/28/23  1629   Amphetamine ,UR POS   Cocaine/Metab,UR NEG   Opiates,UR NEG   THC Metabolite,UR NEG   Benzodiazepinen,UR NEG       Last dispensed if controlled:

## 2023-11-18 NOTE — Telephone Encounter (Signed)
 Patient requesting that her prescriptions are sent to [facility] on [address] as her [facility] has closed. Last seen 07/08/23 for medication check. Pended if ok, thank you

## 2023-11-25 DIAGNOSIS — R002 Palpitations: Secondary | ICD-10-CM

## 2023-11-26 ENCOUNTER — Encounter: Payer: Self-pay | Admitting: Family Medicine

## 2023-11-26 NOTE — Telephone Encounter (Signed)
 I understand my test results didn't show anything, which is good. However, I am still experiencing chest pain/heaviness and now have trouble swallowing to a greater degree. I am suspecting - based on all the symptoms - I may have a hiatal hernia. Let's discuss at my appt. on 6/12 and what testing we can schedule.   Thank you so much,   Gail Jensen

## 2023-11-26 NOTE — Telephone Encounter (Signed)
 This Clinical research associate called pt to gather more data. Pt denied any additional sx's besides chest pain, which she stated subsided 15 minutes prior to this writer calling her. Pt stated that the chest pain had been present throughout the day. This Clinical research associate spoke with Dr. Agatha Horsfall regarding the pt's sx's and Dr. Agatha Horsfall stated that the chest pain could be risidual however we cannot sit on chest pain and did advise that the pt go to the ER to be evaluated. This Clinical research associate spoke with the pt again and relayed Dr. Jody Mura message. Pt stated that she would not be going to the ER, however was very happy with the customer service and concern. This Clinical research associate again encouraged the pt to be seen at the ER, and the pt verbalized understanding but was not agreeable. Dr. Agatha Horsfall was informed and verbalized understanding.

## 2023-12-02 ENCOUNTER — Other Ambulatory Visit
Admission: RE | Admit: 2023-12-02 | Discharge: 2023-12-02 | Disposition: A | Discharge status: Home / Self Care | Source: Ambulatory Visit | Attending: Family Medicine | Admitting: Family Medicine

## 2023-12-02 ENCOUNTER — Other Ambulatory Visit: Payer: Self-pay

## 2023-12-02 ENCOUNTER — Ambulatory Visit: Admitting: Family Medicine

## 2023-12-02 ENCOUNTER — Encounter: Payer: Self-pay | Admitting: Family Medicine

## 2023-12-02 VITALS — BP 126/70 | HR 79 | Temp 98.4°F | Ht 68.0 in | Wt 138.0 lb

## 2023-12-02 DIAGNOSIS — Z5181 Encounter for therapeutic drug level monitoring: Secondary | ICD-10-CM

## 2023-12-02 DIAGNOSIS — I491 Atrial premature depolarization: Secondary | ICD-10-CM

## 2023-12-02 DIAGNOSIS — G4733 Obstructive sleep apnea (adult) (pediatric): Secondary | ICD-10-CM

## 2023-12-02 LAB — DATE/TIME NOT PROVIDED

## 2023-12-02 MED ORDER — VALACYCLOVIR HCL 500 MG PO TABS *I*
500.0000 mg | ORAL_TABLET | Freq: Every day | ORAL | 1 refills | Status: DC | PRN
Start: 2023-12-02 — End: 2023-12-02

## 2023-12-05 ENCOUNTER — Encounter: Payer: Self-pay | Admitting: Family Medicine

## 2023-12-05 DIAGNOSIS — F909 Attention-deficit hyperactivity disorder, unspecified type: Secondary | ICD-10-CM

## 2023-12-05 LAB — CONFIRM THC METABOLITE, URINE: Confirm THC Metab: POSITIVE

## 2023-12-06 LAB — CONFIRM AMPHET: Confirm Amphet: POSITIVE

## 2023-12-06 LAB — DRUG SCREEN PANEL, EMERGENCY
Amphetamine,UR: POSITIVE
Barbiturate,UR: NEGATIVE
Benzodiazepinen,UR: NEGATIVE
Cocaine/Metab,UR: NEGATIVE
Opiates,UR: NEGATIVE
THC Metabolite,UR: POSITIVE

## 2023-12-06 MED ORDER — AMPHETAMINE-DEXTROAMPHETAMINE 10 MG PO TABS *I*
10.0000 mg | ORAL_TABLET | Freq: Every morning | ORAL | 0 refills | Status: DC
Start: 2023-12-06 — End: 2024-01-07

## 2023-12-07 ENCOUNTER — Encounter: Payer: Self-pay | Admitting: Gastroenterology

## 2023-12-07 NOTE — Progress Notes (Signed)
 [facility]  [facility] Medicine  Subjective   Gail Jensen, 13-Dec-1960, who presents for Follow-up (4 weeks)   History of Present Illness  The patient presents for evaluation of palpitations, GERD, and constipation.    She reports experiencing palpitations but has not sought the care of a cardiologist. She has been on estrogen therapy, which was initially increased due to the onset of menstrual periods. Subsequently, her progesterone  dosage was adjusted to a daily regimen, resulting in a full menstrual cycle for three months. To manage this, she reduced the frequency of her estrogen patch application from twice weekly to once every Friday, which effectively halted the menstrual periods. She also self-adjusted her estrogen dose by halving it. She reports no exacerbation of brain fog with the reduced estrogen dose.    She has a history of gastroesophageal reflux disease (GERD) and is currently on famotidine . She experiences severe reflux and gas pain when consuming gluten. She also reports difficulty swallowing and chest pain. Her sister, who works with [employer], suggested that her symptoms might be indicative of a hiatal hernia. She has a scheduled colonoscopy on 01/06/2024 and is considering an endoscopy at the same time.    She reports bowel movements approximately every four days. She has been adding MiraLAX  to her coffee to aid in bowel movements but has not noticed any improvement. She also takes two magnesium  supplements before bedtime.    She is currently on medication for attention deficit disorder (ADD) and says it isn't working    Objective   Vitals  Blood pressure 126/70, pulse 79, temperature 36.9 C (98.4 F), height 1.727 m (5' 8), weight 62.6 kg (138 lb), SpO2 98%.  Physical Exam    Physical Exam  Constitutional:       Appearance: Normal appearance.   HENT:      Head: Normocephalic and atraumatic.      Mouth/Throat:      Mouth: Mucous membranes are moist.   Eyes:      Extraocular Movements:  Extraocular movements intact.      Conjunctiva/sclera: Conjunctivae normal.   Pulmonary:      Effort: Pulmonary effort is normal.   Musculoskeletal:         General: Normal range of motion.   Skin:     General: Skin is warm and dry.   Neurological:      General: No focal deficit present.      Mental Status: She is alert and oriented to person, place, and time. Mental status is at baseline.   Psychiatric:         Mood and Affect: Mood normal.         Behavior: Behavior normal.         Assessment & Plan   Assessment & Plan  1. Palpitations.  The patient's palpitations may be attributed to her estrogen therapy, which she has lowered a few months ago, or stress, alcohol consumption. She continues to have chest discomfort despite negative work up for ACS and her 7 day epatch revealed PACs.     2. Gastroesophageal Reflux Disease (GERD).  She reports trouble swallowing and chest pain, which may be related to her GERD. An Esophagogastroduodenoscopy (EGD) will be ordered to further investigate these symptoms. She is advised to avoid gluten as it exacerbates her reflux and gas pain.  - pt called her GI with whom she has a colonoscopy this month while she was with me and scheduled an eval appt for her GERD sxs  3. Constipation.  She experiences infrequent bowel movements, approximately every four days. She is currently using MiraLAX  and taking magnesium  supplements. She is advised to increase her magnesium  intake to see if it helps improve bowel movements.  4. OSA  - continue using nasal cpap   5. ADHD  - feels that XR adderall doesn't work for her so I will change the Rx from XR to IR 10mg   - recommend abstaining from alcohol for 2 months, increasing resistance exercise, talking to OB about increasing estrogen, decrease progesterone ?      Author: Hali Leventhal, MD  Note signed: 12/07/2023

## 2023-12-23 ENCOUNTER — Encounter: Payer: Self-pay | Admitting: Gastroenterology

## 2023-12-23 ENCOUNTER — Other Ambulatory Visit
Admission: RE | Admit: 2023-12-23 | Discharge: 2023-12-23 | Disposition: A | Discharge status: Home / Self Care | Source: Ambulatory Visit | Attending: Pathology | Admitting: Pathology

## 2023-12-23 DIAGNOSIS — R1310 Dysphagia, unspecified: Secondary | ICD-10-CM | POA: Insufficient documentation

## 2023-12-25 ENCOUNTER — Encounter: Payer: Self-pay | Admitting: Family Medicine

## 2023-12-26 ENCOUNTER — Encounter: Payer: Self-pay | Admitting: Obstetrics and Gynecology

## 2023-12-26 ENCOUNTER — Encounter: Payer: Self-pay | Admitting: Family Medicine

## 2023-12-27 MED ORDER — TRAZODONE HCL 150 MG PO TABS *A*
150.0000 mg | ORAL_TABLET | Freq: Every evening | ORAL | 1 refills | Status: DC
Start: 2023-12-27 — End: 2024-02-11

## 2023-12-27 MED ORDER — SERTRALINE HCL 100 MG PO TABS *I*
100.0000 mg | ORAL_TABLET | Freq: Every day | ORAL | 1 refills | Status: DC
Start: 2023-12-27 — End: 2024-01-31

## 2023-12-29 ENCOUNTER — Encounter: Payer: Self-pay | Admitting: Physical Medicine and Rehabilitation

## 2023-12-29 ENCOUNTER — Encounter: Payer: Self-pay | Admitting: Family Medicine

## 2023-12-30 ENCOUNTER — Ambulatory Visit: Admitting: Family Medicine

## 2023-12-30 ENCOUNTER — Other Ambulatory Visit: Payer: Self-pay

## 2023-12-30 ENCOUNTER — Encounter: Payer: Self-pay | Admitting: Family Medicine

## 2023-12-30 VITALS — BP 125/74 | HR 75 | Temp 99.1°F | Ht 68.0 in | Wt 134.0 lb

## 2023-12-30 DIAGNOSIS — F909 Attention-deficit hyperactivity disorder, unspecified type: Secondary | ICD-10-CM

## 2023-12-30 DIAGNOSIS — K5900 Constipation, unspecified: Secondary | ICD-10-CM

## 2023-12-30 NOTE — Patient Instructions (Signed)
 Ferritin is a little low, so please start  IRON ~18mg  every other day     Magnesium  twice daily

## 2024-01-04 LAB — SURGICAL PATHOLOGY

## 2024-01-06 ENCOUNTER — Other Ambulatory Visit
Admission: RE | Admit: 2024-01-06 | Discharge: 2024-01-06 | Disposition: A | Discharge status: Home / Self Care | Source: Ambulatory Visit | Attending: Pathology | Admitting: Pathology

## 2024-01-06 ENCOUNTER — Encounter: Payer: Self-pay | Admitting: Gastroenterology

## 2024-01-06 DIAGNOSIS — Z1211 Encounter for screening for malignant neoplasm of colon: Secondary | ICD-10-CM | POA: Insufficient documentation

## 2024-01-06 LAB — HM COLONOSCOPY

## 2024-01-07 ENCOUNTER — Other Ambulatory Visit: Payer: Self-pay | Admitting: Family Medicine

## 2024-01-07 ENCOUNTER — Encounter: Payer: Self-pay | Admitting: Family Medicine

## 2024-01-07 DIAGNOSIS — F909 Attention-deficit hyperactivity disorder, unspecified type: Secondary | ICD-10-CM

## 2024-01-07 MED ORDER — AMPHETAMINE-DEXTROAMPHETAMINE 10 MG PO TABS *I*
10.0000 mg | ORAL_TABLET | Freq: Every morning | ORAL | 0 refills | Status: DC
Start: 2024-01-07 — End: 2024-01-10

## 2024-01-07 NOTE — Telephone Encounter (Signed)
 [facility] First Name Last Name Birth Date Gender Street Address [place] Zip Code   [name] 10-22-1960 Female [address] [place] [address]   Gail Jensen [name] 10/17/1960 Female [address] [place] [address]        Search:  [facility] My Rx Current Rx Drug Type Rx Written Rx Dispensed Drug Quantity Days Supply Prescriber Name Prescriber [facility] # Payment Method Dispenser   A Gail Jensen 12/06/2023 12/06/2023 dextroamp-amphetamin 10 mg tab 30 30 Henry Braun [id number] Insurance [employer]. Gail Jensen 09/23/2023 09/24/2023 dextroamp-amphet er 10 mg cap 30 30 Henry Braun [id number] [facility] [phone]   A N N  07/19/2023 07/30/2023 testosterone  micronized powder * 0gm [address] Gail Lynwood JONELLE Mickey MD [id number] [facility]   B N N S 07/22/2023 07/23/2023 dextroamp-amphet er 10 mg cap 30 30 Wallie Gee, Henryetta, (MD) [id number] [facility] [phone]   Gail Jensen 05/28/2023 06/02/2023 dextroamp-amphet er 10 mg cap 30 30 Henry Braun [id number] [facility] [phone]     Showing 1 to 5 of 5 entries

## 2024-01-07 NOTE — Telephone Encounter (Signed)
 Last office visit:   12/30/2023  Patients upcoming appointments:  Future Appointments   Date Time Provider Department Center   03/17/2024 10:00 AM Henry Braun, MD [facility] None   09/08/2024  1:00 PM Henry Braun, MD [facility] None     Recent Lab results:  GENERAL CHEMISTRY   Recent Labs     10/29/23  1543 06/07/23  1442 06/02/23  1421   NA 140 140 139   K 4.4 4.1 4.5   CL 102 102 103   CO2 23 23 27    GAP 15 15 9    UN 18 17 12    CREAT 0.93 0.87 0.77   GLU 97 98 98   CA 9.3 9.9 9.6      LIPID PROFILE   Recent Labs     06/02/23  1421   CHOL 210*   TRIG 106   HDL 79*   LDLC 110      LIVER PROFILE   Recent Labs     10/29/23  1543 06/02/23  1421   ALT 26 25   AST 22 22   ALK 48 56   TB 0.3 0.2      DIABETES THYROID    Recent Labs     09/06/23  1543 06/02/23  1421   HA1C 5.7* 5.6    No value within the past 365 days      Pending/Orders Labs:  Lab Frequency Next Occurrence   Echo Complete Once 01/20/2023   Mammography screening BILATERAL Once 09/06/2023        Opioid Drug Screen:      Lab results: 12/02/23  1754   Amphetamine ,UR POS   Cocaine/Metab,UR NEG   Opiates,UR NEG   THC Metabolite,UR POS   Benzodiazepinen,UR NEG   Confirm THC Metab POS       Last dispensed if controlled:

## 2024-01-07 NOTE — Telephone Encounter (Signed)
 Referral to ortho pended     Thank you!

## 2024-01-10 ENCOUNTER — Encounter: Payer: Self-pay | Admitting: Physical Medicine and Rehabilitation

## 2024-01-10 MED ORDER — AMPHETAMINE-DEXTROAMPHETAMINE 15 MG PO TABS *I*
15.0000 mg | ORAL_TABLET | Freq: Every morning | ORAL | 0 refills | Status: DC
Start: 2024-01-10 — End: 2024-02-25

## 2024-01-10 NOTE — Addendum Note (Signed)
 Addended by: HENRY BRAUN on: 01/10/2024 11:25 AM     Modules accepted: Orders

## 2024-01-11 ENCOUNTER — Encounter: Payer: Self-pay | Admitting: Physical Medicine and Rehabilitation

## 2024-01-11 ENCOUNTER — Telehealth: Payer: Self-pay

## 2024-01-11 NOTE — Telephone Encounter (Signed)
 Patient left voicemail to schedule injection, please provide order or does patient need to be seen first?

## 2024-01-12 ENCOUNTER — Encounter: Payer: Self-pay | Admitting: Family Medicine

## 2024-01-14 LAB — SURGICAL PATHOLOGY

## 2024-01-18 ENCOUNTER — Encounter: Payer: Self-pay | Admitting: Physical Medicine and Rehabilitation

## 2024-01-19 ENCOUNTER — Encounter: Payer: Self-pay | Admitting: Gastroenterology

## 2024-01-19 NOTE — Progress Notes (Signed)
 [facility]  [address]  [place] [address]  Phone: [phone]  Fax: [phone]     Patient ID: Gail Jensen is a 63 y.o. female.    Chief Complaint   Patient presents with    Bleeding/Bruising     Pain at iv site after endoscopy      HPI    Medications Reviewed and changes   Current Outpatient Medications   Medication Sig    sertraline  (ZOLOFT ) 100 mg tablet Take 1 tablet (100 mg total) by mouth daily.    traZODone  (DESYREL ) 150 mg tablet Take 1 tablet (150 mg total) by mouth nightly.    estradiol  0.05 MG/24HR twice-weekly patch Apply 1 patch onto the skin twice a week.    medroxyPROGESTERone  (PROVERA ) 2.5 mg tablet Take 1 tablet (2.5 mg total) by mouth daily.    famotidine  (PEPCID ) 40 mg tablet TAKE 1 TABLET BY MOUTH NIGHTLY FOR GASTROESOPHAGEAL REFLUX DISEASE    clobetasol  (TEMOVATE ) 0.05 % external solution Apply 1 g topically as needed.    testosterone  transdermal gel *compound* Testosterone  in  cream base   in a topiclick    8mg /gm. Apply 1/4 gram (2 mg) to inner thigh daily. Code F.  Maximum daily dose 2mg .    generic DME Item to dispense: wrist brace  Instructions for use: use for activities involving the right upper limb.    methocarbamol  (ROBAXIN ) 500 mg tablet take 1-2 tablets q8hrs prn muscle spasm    melatonin 3 mg Take 1 tablet (3 mg total) by mouth nightly as needed for Sleep for Trouble Sleeping.    lamoTRIgine  (LAMICTAL ) 100 mg tablet Take 1 tablet (100 mg total) by mouth at bedtime for Manic-Depression.    lidocaine  (XYLOCAINE ) 5 % ointment Apply small amount topically ever 6 hours as needed for hemorrhoid pain    NONFORMULARY, OTHER, ORDER *I* 1 each  CPAP    Multiple Vitamin (DAILY-VITE MULTIVITAMIN) TABS Take 1 tablet by mouth daily.    amphetamine -dextroamphetamine  (ADDERALL) 15 mg tablet Take 1 tablet (15 mg total) by mouth every morning. Max daily dose: 15 mg           Objective:    BP 125/74   Pulse 75   Temp 37.3 C (99.1 F)   Ht 1.727 m (5' 8)   Wt  60.8 kg (134 lb)   SpO2 99%   BMI 20.37 kg/m   Pain    12/30/23 1433   PainSc:   6   PainLoc: Arm      Physical Exam  Vitals and nursing note reviewed.   Constitutional:       Appearance: Normal appearance.   HENT:      Head: Normocephalic and atraumatic.      Nose: Nose normal.      Mouth/Throat:      Mouth: Mucous membranes are moist.   Eyes:      Extraocular Movements: Extraocular movements intact.      Conjunctiva/sclera: Conjunctivae normal.   Cardiovascular:      Rate and Rhythm: Normal rate.   Pulmonary:      Effort: Pulmonary effort is normal.   Abdominal:      General: There is no distension.   Musculoskeletal:         General: Normal range of motion.      Cervical back: Normal range of motion.   Skin:     General: Skin is warm and dry.   Neurological:      General:  No focal deficit present.      Mental Status: She is alert and oriented to person, place, and time.   Psychiatric:         Mood and Affect: Mood normal.         Behavior: Behavior normal.           Assessment:      1. Constipation, unspecified constipation type  - in crease magnesium , mirilax as needed,  - following with GI now for GERD and colon cancer screening    2. ADHD  -increase from 10 to 15mg  IR adderall to help with energy    3. Fatigue  - ferritin was on the low normal side so she can start taking low dose iron        Orders Placed This Encounter   No orders placed during this encounter.     Patient Instructions   Ferritin is a little low, so please start  IRON ~18mg  every other day     Magnesium  twice daily     Electronically signed by SALLYANNE MOULDER, MD 12/30/2023 8:15 PM  [facility], Phone: [phone]

## 2024-01-31 ENCOUNTER — Other Ambulatory Visit: Payer: Self-pay | Admitting: Family Medicine

## 2024-02-01 MED ORDER — SERTRALINE HCL 100 MG PO TABS *I*
100.0000 mg | ORAL_TABLET | Freq: Every day | ORAL | 1 refills | Status: AC
Start: 2024-02-01 — End: ?

## 2024-02-01 NOTE — Telephone Encounter (Signed)
 Last office visit: 7/10/2025Patients upcoming appointments:Future Appointments Date Time Provider Department Center 02/23/2024  2:00 PM Raguel Maxwell, DO [facility] None 03/17/2024 10:00 AM Henry Braun, MD [facility] None 09/08/2024  1:00 PM Henry Braun, MD [facility] None Recent Lab results:GENERAL CHEMISTRY Recent Labs   05/09/251543 12/16/241442 12/11/241421 NA 140 140 139 K 4.4 4.1 4.5 CL 102 102 103 CO2 23 23 27  GAP 15 15 9  UN 18 17 12  CREAT 0.93 0.87 0.77 GLU 97 98 98 CA 9.3 9.9 9.6  LIPID PROFILE Recent Labs   12/11/241421 CHOL 210* TRIG 106 HDL 79* LDLC 110  LIVER PROFILE Recent Labs   05/09/251543 12/11/241421 ALT 26 25 AST 22 22 ALK 48 56 TB 0.3 0.2  DIABETES THYROID  Recent Labs   03/17/251543 12/11/241421 HA1C 5.7* 5.6  No value within the past 365 days  Pending/Orders Labs:Lab Frequency Next Occurrence Echo Complete Once 01/20/2023 Mammography screening BILATERAL Once 09/06/2023  Opioid Drug Screen:  Lab results: 06/12/251754 Amphetamine ,UR POS Cocaine/Metab,UR NEG Opiates,UR NEG THC Metabolite,UR POS Benzodiazepinen,UR NEG Confirm THC Metab POS Last dispensed if controlled:

## 2024-02-03 ENCOUNTER — Other Ambulatory Visit: Payer: Self-pay | Admitting: Obstetrics and Gynecology

## 2024-02-03 NOTE — Telephone Encounter (Signed)
 Patient's pharmacy requesting a refill of medroxyprogesterone  2.5 mg and estradiol  0.05 mg twice weekly patch. Last seen 07/08/23 for sonohysterogram. Last annual exam 11/12/22. MyChart message sent to the patient asking that she schedule an annual exam/medication check. Extension pended if ok, thank you

## 2024-02-07 LAB — UNMAPPED LAB RESULTS
Hematocrit (HT): 40 % (ref 35–47)
Hemoglobin (HGB) (HT): 13.2 g/dL (ref 12.0–16.0)
MCHC (HT): 33.1 g/dL (ref 31.0–37.5)
MCV (HT): 89 fL (ref 80–100)
Mean Corpuscular Hemoglobin (MCH) (HT): 29.4 pg (ref 26.0–34.0)
Platelets (HT): 272 10 3/uL (ref 150–450)
RBC (HT): 4.49 10 6/uL (ref 3.80–5.20)
RDW (HT): 12.5 % (ref 0.0–15.2)
WBC (HT): 6.7 10 3/uL (ref 4.0–11.0)

## 2024-02-11 ENCOUNTER — Other Ambulatory Visit: Payer: Self-pay | Admitting: Family Medicine

## 2024-02-14 ENCOUNTER — Encounter: Payer: Self-pay | Admitting: Family Medicine

## 2024-02-14 MED ORDER — TRAZODONE HCL 150 MG PO TABS *A*
150.0000 mg | ORAL_TABLET | Freq: Every evening | ORAL | 1 refills | Status: AC
Start: 2024-02-14 — End: ?

## 2024-02-14 NOTE — Telephone Encounter (Signed)
 Last office visit: 7/10/2025Patients upcoming appointments:Future Appointments Date Time Provider Department Center 02/23/2024  2:00 PM Raguel Maxwell, DO [facility] None 03/17/2024 10:00 AM Henry Braun, MD [facility] None 09/08/2024  1:00 PM Henry Braun, MD [facility] None Recent Lab results:GENERAL CHEMISTRY Recent Labs   05/09/251543 12/16/241442 12/11/241421 NA 140 140 139 K 4.4 4.1 4.5 CL 102 102 103 CO2 23 23 27  GAP 15 15 9  UN 18 17 12  CREAT 0.93 0.87 0.77 GLU 97 98 98 CA 9.3 9.9 9.6  LIPID PROFILE Recent Labs   12/11/241421 CHOL 210* TRIG 106 HDL 79* LDLC 110  LIVER PROFILE Recent Labs   05/09/251543 12/11/241421 ALT 26 25 AST 22 22 ALK 48 56 TB 0.3 0.2  DIABETES THYROID  Recent Labs   03/17/251543 12/11/241421 HA1C 5.7* 5.6  No value within the past 365 days  Pending/Orders Labs:Lab Frequency Next Occurrence Mammography screening BILATERAL Once 09/06/2023  Opioid Drug Screen:  Lab results: 06/12/251754 Amphetamine ,UR POS Cocaine/Metab,UR NEG Opiates,UR NEG THC Metabolite,UR POS Benzodiazepinen,UR NEG Confirm THC Metab POS Last dispensed if controlled:

## 2024-02-15 ENCOUNTER — Encounter: Payer: Self-pay | Admitting: Gastroenterology

## 2024-02-15 NOTE — Unmapped External Note (Signed)
 DATE OF SERVICE: 08/26/2025SURGEON:  Redell Dollar, M.D.ASSISTANT:  Rockey Low, PA-C.PREOPERATIVE DIAGNOSIS:  Unappealing nose.POSTOPERATIVE DIAGNOSIS:  Unappealing nose.PROCEDURE:  Rhinoplasty.ANESTHESIA:  General.ESTIMATED BLOOD LOSS:  Minimal.COMPLICATIONS:  None.REASON FOR PROCEDURE:  The patient is a 62 year old female who is disappointed with the appearance of her nose.  She disliked the bulbous tip and the hanging of her large tip.  She also did not like how wide her nose is.  She presents today for a rhinoplasty.DESCRIPTION OF PROCEDURE:  After adequate informed consent was obtained from the patient, she was brought into the operating room and placed supine on the operating room table.  She was successfully placed under general anesthesia and her pressure points were all well padded.  Her face was prepped and draped in the usual standard surgical fashion.  A timeout was performed to confirm the correctness of the patient, the procedure, and the site of  surgery.  The procedure was started by anesthetizing the nose with 1% lidocaine  with epinephrine .  Once adequate time had elapsed for the anesthetic to take effect, a columellar incision was made in the shape of an inverted V.  This was extended bilaterally into marginal incisions in each nostril.  The skin was then elevated off the cartilage to expose the lower lateral cartilages.  Once this was done, a cephalic trim was performed making sure  to leave approximately 6 to 8 mm of residual cartilage to support the nasal tip.  The removed cartilage was handed off the table.  Hemostasis was obtained with electrocautery and the nose was irrigated.  The skin was re-draped and there was excellent contour to the nose with significant improvement to the bulbous tip.  The columellar incision was then closed with 5-0 Prolene suture in an interrupted fashion.  The patient did have flaring of her  nostrils and the decision was  made to decrease the alar length.  A wedge incision was made at the base of the ala bilaterally and the skin removed.  Hemostasis was obtained with electrocautery.  The incisions bilaterally were placed down in the alar crease and closed with 5-0 Monocryl suture in an interrupted fashion.  There was excellent contour and shape to the nasal tip once this was done.  The nose was cleaned and dried and Steri-Strips  were applied with Mastisol.  The patient was placed into a [place] splint.  Her nasopharynx was suctioned and she was gently brought out of anesthesia.  She was brought to the recovery room in good condition.  All sponge and instrument counts were correct at the end of the case.  I was present and participated in all parts of the procedure.

## 2024-02-16 NOTE — Progress Notes (Addendum)
 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  At this time although bruising has improved, edema persists especially about the anterolateral > posterior elbow.-At baseline there is history of neck pain but this is not radicular.Prior Care -12/30/23 PCP: Seen for multiple issues including Bleeding/bruising, pain at iv site after endoscopy.  Function-02/23/24: symptoms interfere with using her RUL.Patient Reported Outcomes12/17/2024 I reviewed the patient reported outcomes.  Depression screening came back normal with a score of 54.  Pain Interferences creening came back moderate with a score of 64. Physical Function screening came back moderate with a score of 31. Conservative Treatment PT/OT (06/08/23 recommended.  06/23/22 first appt pending for 06/30/23).Wrist brace Medication Management Topical lidocaineIbuprofenMethocarbamol ( no help )SertralineLamotrigineMelatoninTrazodoneInterventional ProceduresDate Procedure Improvement 07/01/2499/30/25 USG right CET CSIUSG right PIN HD  Improved Improved   Objective  Medical History Past Medical History: Diagnosis Date  Anxiety   Arthritis   Celiac disease   Depression   GERD (gastroesophageal reflux disease)   Patient Active Problem List  Diagnosis Code  Lumbar radiculopathy M54.16  Attention deficit hyperactivity disorder (ADHD) F90.9  Celiac disease K90.0  Gastroesophageal reflux disease K21.9  Psoriasis of scalp L40.9  Bipolar disorder F31.9  Asthma J45.909  OSA (obstructive sleep apnea) G47.33  PAC (premature atrial contraction) I49.1 Insomnia Expanded History>Right CET and possible radial nerve entrapment.-07/22/23 PMR: no prior history of RUL symptoms until 02/2023 when she developed right lateral elbow pain that progressively involved the distal RUL, although there is no specific inciting injury symptoms onset during a period that she was moving over a course of 3 weeks.  They describe right lateral elbow pain and paresthesias that radiate down the posterior forearm, posterior hand and posterior digits 2-4; there is associated sensation of her fingers feeling cold and turning purple; and unclear nonfocal weakness question of pain limitation.  Symptoms are worse with holding things in her right hand that weigh more than a pen.  Symptoms improve with rest.  At baseline there is history of neck pain but this is not radicular.  S/p USG right CET CSI she reported improvement upon presenting for for USG right PIN HD.  02/23/24 notes additional improvement s/p radial nerve HD that was lasting until injury 12/23/23.  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 at the level of the distal supinator.Past Surgical History: Procedure Laterality Date  BREAST ENHANCEMENT SURGERY Bilateral 11/1998  CESAREAN SECTION, UNSPECIFIED  1996  EYE SURGERY    Cataract surgery 9.13.24  LAMINECTOMY  08/2021  PR ARTHRODESIS COMBINED TQ 1NTRSPC LUMBAR N/A 03/16/2022  Procedure: L4-5 TLIF;  Surgeon: Karron Knee, MD;  Location: [facility] MAIN OR;  Service: Orthopedics  PR DISE DYN EVAL SLEEP DISORDERED BREATHING FLX DX N/A 06/11/2023  Procedure: DRUG INDUCED SLEEP ENDOSCOPY;   Surgeon: Larene Mech, MD;  Location: [facility] MAIN OR;  Service: ENT  PR REINSERTION SPINAL FIXATION DEVICE N/A 04/01/2022  Procedure: Revision of right L4 screw, Globus (120  min);  Surgeon: Karron Knee, MD;  Location: [facility] MAIN OR;  Service: Orthopedics  SPINE SURGERY  09-05-21, Sept. 2023, Oct. 2024 S/p nasal tip cosmetic sxSocial History Socioeconomic History  Marital status: Divorced Tobacco Use  Smoking status: Never  Smokeless tobacco: Never Substance and Sexual Activity  Alcohol use: Yes   Alcohol/week: 7.0 standard drinks of alcohol   Types: 7 Glasses of wine per week   Comment: Glass of wine w/ dinner nightly,  Drug use: Not Currently   Types: Other-see comments, Marijuana   Comment: Sleep gummies but they aren't working  Sexual activity: Not Currently   Partners: Male   Comment: N/a Assistive Device: noneOccupational HistoryWork Status: workingJob duties include: office workFamily History family history includes Anxiety disorder in her mother; Arthritis in her maternal grandmother; Bladder Cancer in her mother; Cancer in her mother; Depression in her daughter, mother, sister, and son; Diabetes in her maternal aunt; GERD in her mother; Kidney Disease in her mother; Liver cancer in her mother. Allergies Allergies Allergen Reactions  Ciprofloxacin Hives  Sulfa Antibiotics Hives   hives  Sulfasalazine Other (See Comments)   hives  Wheat Other (See Comments)   Celiac disease History of adverse reaction to anesthetics: noHistory of adverse reaction to contrast: noHistory of adverse reaction to steroids: noCurrent MedicationsCurrent Outpatient Medications on File Prior to Visit Medication Sig Dispense Refill  traZODone  (DESYREL ) 150 mg tablet Take 1 tablet (150 mg total) by mouth nightly. 90 tablet 1  medroxyPROGESTERone  (PROVERA ) 2.5 mg tablet TAKE 1 TABLET BY MOUTH EVERY DAY 90 tablet 0   ESTRADIOL  0.05 MG/24HR twice-weekly patch APPLY 1 PATCH ONTO THE SKIN TWICE A WEEK 24 patch 0  sertraline  (ZOLOFT ) 100 mg tablet Take 1 tablet (100 mg total) by mouth daily. 90 tablet 1  amphetamine -dextroamphetamine  (ADDERALL) 15 mg tablet Take 1 tablet (15 mg total) by mouth every morning. Max daily dose: 15 mg 30 tablet 0  famotidine  (PEPCID ) 40 mg tablet TAKE 1 TABLET BY MOUTH NIGHTLY FOR GASTROESOPHAGEAL REFLUX DISEASE 90 tablet 1  clobetasol  (TEMOVATE ) 0.05 % external solution Apply 1 g topically as needed. 50 mL 0  testosterone  transdermal gel *compound* Testosterone  in  cream base   in a topiclick8mg /gm. Apply 1/4 gram (2 mg) to inner thigh daily. Code F.Maximum daily dose 2mg . 22.5 g 0  generic DME Item to dispense: wrist brace  Instructions for use: use for activities involving the right upper limb. 1 each 0  methocarbamol  (ROBAXIN ) 500 mg tablet take 1-2 tablets q8hrs prn muscle spasm 60 tablet 0  melatonin 3 mg Take 1 tablet (3 mg total) by mouth nightly as needed for Sleep for Trouble Sleeping. 30 tablet 1  lamoTRIgine  (LAMICTAL ) 100 mg tablet Take 1 tablet (100 mg total) by mouth at bedtime for Manic-Depression. 45 tablet 2  lidocaine  (XYLOCAINE ) 5 % ointment Apply small amount topically ever 6 hours as needed for hemorrhoid pain    NONFORMULARY, OTHER, ORDER *I* 1 each  CPAP    Multiple Vitamin (DAILY-VITE MULTIVITAMIN) TABS Take 1 tablet by mouth daily.   No current facility-administered medications on file prior to visit. AC/AP use (excluding ASA 81): noOpioid use: noBenzodiazepine use: no Physical Exam 06/08/2023 General: Pleasant, NAD, slender.CV: No peripheral edema in the bilateral upper limbs.Respiratory: Non-labored respirations, no wheezing or coughing noted from bedside.MSK:  -Non-tender to palpation of the cervical spine and trapezius regions bilaterally, except discordant TTP bilateral trapezius regions.  TTP right lateral  epicondyle and CET with concordant pain with palpation and during  resisted wrist and finger extension.  -Multidirectional ROM full.    -Manual muscle strength testing normal with respect to bilateral shoulder abduction, elbow flexion, elbow extension, wrist extension, finger extension, finger flexion and finger abduction. Neuro: -Alert and oriented.  -Reflexes 2+ at the bilateral biceps, brachioradialis, and triceps tendons.-Sensation intact to light touch and temperature in the bilateral upper limbs.-No evidence of clonus or abnormalities of tone.  -Hoffman's sign negative bilaterally. -Spurling's test negative bilaterally.Psych: Appropriate affect and mood.Skin: No open wounds, signs of infection or bruising on limited skin exam.09/03/25TTP and edema with fullness about the anterior elbow at the cubital fossa, lateral, and somewhat posterior elbow region.    Imaging >>> 06/07/2023 CT head without contrast:No evidence of acute transcortical ischemic infarct or hemorrhage. >>>06/08/23 right elbow xrays:Limited two-view study. Questionable irregularity at the coronoid process of the ulna which may represent degenerative osteophytic spurring versus age-indeterminate avulsion. Prominent ossification along the lateral epicondyle of the distal humerus compatible with enthesopathy. Preserved joint spaces for patient age. No prominent elbow joint effusion. -On prior review: osteophytic spurring noted.>>>02/23/24 limited Dx US  right elbow impression:-Anechoic regions of interest in comparison to the contralateral side suggestive of fluid accumulation in the soft tissues in the anterior elbow that appear to track as far laterally as the lateral elbow region, with isoechoic changes noted in the soft tissues. Diagnostic Tests >>>06/24/23 Dx US  right elbow demonstrates:Right common extensor tendinosis with partial thickness tear at the undersurface and  neovascularity; and, enlargement of the posterior interosseous nerve at the distal edge of the supinator.  Assessment 63 y.o. 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  At this time although bruising has improved, edema persists especially about the anterolateral > posterior elbow.-At baseline there is history of neck pain but this is not radicular.-Function is affected, symptoms interfere with using RUL.-Treatment as per HPI including ice and medication.-Limited Dx US  demonstrates anechoic regions of interest in comparison to the contralateral side suggestive of fluid accumulation in the soft tissues in the anterior elbow that appear to track as far laterally as the lateral elbow region, with isoechoic changes noted in the soft tissues.-Exam demonstrates TTP and edema with fullness about the anterior elbow at the cubital fossa, lateral, and somewhat posterior elbow region.   -Symptoms concerning for vascular injury with associated soft tissue change.Comorbidities:-Anxiety, depression, bipolar, insomnia, ADHD-GERD-Celiac disease, psoriasis-S/p lumbar spine surgery (L4-5 TLIF Dr. Karron 2023 with revision of right L4 screw 2023)Plan -Limited Dx US  anterior elbow.-Stat Doppler US  RUL-MRI right elbow w/o w/-Follow up after imaging at which time can consider the role for Dx US  lateral elbow, additional conservative measures, medication management, and/or interventions. Notes Reviewed Include:-PCP  (OV and multiple communications), Sx-Greater than 50 minutes was spent seeing the patient and performing pre and post encounter related tasks including coordination of care.03/06/24>03/05/24 MRI right elbow w/o w/ findings included Radial collateral ligament full-thickness tear, with high-grade partial-thickness tearing of the common extensor tendon and likely sprain at the humeral attachment of the lateral band of the ulnar collateral ligament.  Small to moderate joint effusion  with synovitis.  Mild radiocapitellar osteoarthrosis. -Pt notified and referred to ortho.  Can follow up with PMR as needed.  04/10/24-Requested refill of methocarbamol , provided with future refills as per ortho and/or PCP pending results of recommended consultation.  Methocarbamol  500mg , take 1-2 tablets q8 hrs prn muscle spasm, #60, no refills.

## 2024-02-16 NOTE — Telephone Encounter (Signed)
 Reached out to patient to relay provider message, and she expressed verbal understanding. However, she stated that she just had minor surgery, so she cannot get out and about today or tomorrow. She stated that she will reach out on Friday morning if she is feeling well enough to come into the office in order to try and book a same day appointment.

## 2024-02-21 ENCOUNTER — Other Ambulatory Visit: Payer: Self-pay | Admitting: Family Medicine

## 2024-02-21 DIAGNOSIS — F909 Attention-deficit hyperactivity disorder, unspecified type: Secondary | ICD-10-CM

## 2024-02-22 ENCOUNTER — Other Ambulatory Visit: Payer: Self-pay

## 2024-02-22 NOTE — Telephone Encounter (Signed)
 [facility] [place] First Name Last Name Birth Date Gender Street Address [place] Zip Code [place] Miana Politte [name] 08/17/1960 Female [address] [place] [address] Carlyn Lemke [name] 1961/06/02 Female [address] [place] [address]  Search:PDI My Rx Current Rx Drug Type Rx Written Rx Dispensed Drug Quantity Days Supply Prescriber Name Prescriber [facility] # Payment Method Dispenser A N N O 02/15/2024 02/15/2024 hydrocodone-acetaminophen  5-325 mg tablet 15 2 Wilhelmena Rockey ORN (PA-C) [id number] Insurance [employer] #06 A Y LOISE RAMAN 01/07/2024 01/10/2024 dextroamp-amphetamin 10 mg tab 30 30 [facility] [place] [id number] Insurance [employer] #06 A CINDERELLA LOISE RAMAN 01/10/2024 01/10/2024 dextroamp-amphetamin 15 mg tab 30 30 [place] [id number] Insurance [employer] #06 A Y LOISE RAMAN 12/06/2023 12/06/2023 dextroamp-amphetamin 10 mg tab 30 30 [place] [id number] Insurance [employer] ZELDA KATHEE CINDERELLA LOISE RAMAN 09/23/2023 09/24/2023 dextroamp-amphet er 10 mg cap 30 30 [place] [id number] [facility] [phone] A N N  07/19/2023 07/30/2023 testosterone  micronized powder * 0gm [address] [name] MD [id number] [facility] B N N S 07/22/2023 07/23/2023 dextroamp-amphet er 10 mg cap 30 30 Wallie Gee, [place], (MD) [id number] [facility] [phone] KATHEE CINDERELLA LOISE RAMAN 05/28/2023 06/02/2023 dextroamp-amphet er 10 mg cap 30 30 [place] [id number] [facility] [phone] Showing 1 to 8 of 8 entries

## 2024-02-22 NOTE — Telephone Encounter (Signed)
 Last office visit: 7/10/2025Patients upcoming appointments:Future Appointments Date Time Provider Department Center 02/23/2024  2:00 PM Raguel Maxwell, DO [facility] None 03/17/2024 10:00 AM Henry Braun, MD [facility] None 09/08/2024  1:00 PM Henry Braun, MD [facility] None Recent Lab results:GENERAL CHEMISTRY Recent Labs   05/09/251543 12/16/241442 12/11/241421 NA 140 140 139 K 4.4 4.1 4.5 CL 102 102 103 CO2 23 23 27  GAP 15 15 9  UN 18 17 12  CREAT 0.93 0.87 0.77 GLU 97 98 98 CA 9.3 9.9 9.6  LIPID PROFILE Recent Labs   12/11/241421 CHOL 210* TRIG 106 HDL 79* LDLC 110  LIVER PROFILE Recent Labs   05/09/251543 12/11/241421 ALT 26 25 AST 22 22 ALK 48 56 TB 0.3 0.2  DIABETES THYROID  Recent Labs   03/17/251543 12/11/241421 HA1C 5.7* 5.6  No value within the past 365 days  Pending/Orders Labs:Lab Frequency Next Occurrence Mammography screening BILATERAL Once 09/06/2023  Opioid Drug Screen:  Lab results: 06/12/251754 Amphetamine ,UR POS Cocaine/Metab,UR NEG Opiates,UR NEG THC Metabolite,UR POS Benzodiazepinen,UR NEG Confirm THC Metab POS Last dispensed if controlled:

## 2024-02-23 ENCOUNTER — Encounter: Payer: Self-pay | Admitting: Obstetrics and Gynecology

## 2024-02-23 ENCOUNTER — Ambulatory Visit: Admitting: Physical Medicine and Rehabilitation

## 2024-02-23 VITALS — BP 129/80 | HR 82 | Ht 68.0 in | Wt 134.0 lb

## 2024-02-23 DIAGNOSIS — R609 Edema, unspecified: Secondary | ICD-10-CM

## 2024-02-23 DIAGNOSIS — M79601 Pain in right arm: Secondary | ICD-10-CM

## 2024-02-23 NOTE — Progress Notes (Addendum)
 Patient: Gail Jensen DOB: 1960/10/02 MRN: [id number] Date: 02/23/2024 Chief Complaint-Right elbow edemaStudy-This is a sonographic evaluation of the right anterior elbow.-A Sonosite PX and Linear array with a frequency range of 4-15 MHz was used. FindingsIn order to optimize accuracy and make appropriate recommendations a side-to-side analysis was made of the right anterior elbow.The asymptomatic left side was evaluated for comparison. The symptomatic side demonstrated intact brachioradialis, brachialis, and pronator teres muscles at the level of the elbow.  Anechoic regions of interest in comparison to the contralateral side suggestive of fluid accumulation in the soft tissues in the anterior elbow that appear to track as far laterally as the lateral elbow region, with isoechoic changes noted in the soft tissues.  No obvious discrete mass on limited ultrasound.ImpressionAnechoic regions of interest in comparison to the contralateral side suggestive of fluid accumulation in the soft tissues in the anterior elbow that appear to track as far laterally as the lateral elbow region, with isoechoic changes noted in the soft tissues.

## 2024-02-24 ENCOUNTER — Other Ambulatory Visit: Payer: Self-pay

## 2024-02-25 ENCOUNTER — Encounter: Payer: Self-pay | Admitting: Physical Medicine and Rehabilitation

## 2024-02-25 ENCOUNTER — Ambulatory Visit
Admission: RE | Admit: 2024-02-25 | Discharge: 2024-02-25 | Disposition: A | Discharge status: Home / Self Care | Source: Ambulatory Visit | Attending: Physical Medicine and Rehabilitation | Admitting: Physical Medicine and Rehabilitation

## 2024-02-25 DIAGNOSIS — R609 Edema, unspecified: Secondary | ICD-10-CM | POA: Insufficient documentation

## 2024-02-25 DIAGNOSIS — R600 Localized edema: Secondary | ICD-10-CM | POA: Insufficient documentation

## 2024-03-04 ENCOUNTER — Other Ambulatory Visit: Payer: Self-pay

## 2024-03-05 ENCOUNTER — Ambulatory Visit
Admission: RE | Admit: 2024-03-05 | Discharge: 2024-03-05 | Disposition: A | Discharge status: Home / Self Care | Source: Ambulatory Visit | Attending: Physical Medicine and Rehabilitation | Admitting: Physical Medicine and Rehabilitation

## 2024-03-05 ENCOUNTER — Other Ambulatory Visit: Payer: Self-pay | Admitting: Physical Medicine and Rehabilitation

## 2024-03-05 DIAGNOSIS — M25421 Effusion, right elbow: Secondary | ICD-10-CM | POA: Insufficient documentation

## 2024-03-05 DIAGNOSIS — S5321XA Traumatic rupture of right radial collateral ligament, initial encounter: Secondary | ICD-10-CM | POA: Insufficient documentation

## 2024-03-05 DIAGNOSIS — M19021 Primary osteoarthritis, right elbow: Secondary | ICD-10-CM | POA: Insufficient documentation

## 2024-03-05 DIAGNOSIS — R609 Edema, unspecified: Secondary | ICD-10-CM

## 2024-03-05 MED ORDER — GADOPICLENOL 0.5 MMOL/ML (VUEWAY) IV SOLN *I*
0.1000 mL/kg | Freq: Once | INTRAVENOUS | Status: AC
Start: 2024-03-05 — End: 2024-03-05
  Administered 2024-03-05: 6.8 mL via INTRAVENOUS

## 2024-03-06 ENCOUNTER — Encounter: Payer: Self-pay | Admitting: Physical Medicine and Rehabilitation

## 2024-03-06 NOTE — Addendum Note (Signed)
 Addended by: Sahaj Bona on: 03/06/2024 03:53 PM Modules accepted: Orders

## 2024-03-08 ENCOUNTER — Encounter: Payer: Self-pay | Admitting: Physical Medicine and Rehabilitation

## 2024-03-08 ENCOUNTER — Encounter: Payer: Self-pay | Admitting: Gastroenterology

## 2024-03-08 NOTE — Addendum Note (Signed)
 Addended by: Leanora Murin on: 03/08/2024 12:10 PM Modules accepted: Orders

## 2024-03-15 ENCOUNTER — Other Ambulatory Visit

## 2024-03-15 ENCOUNTER — Encounter: Payer: Self-pay | Admitting: Physical Medicine and Rehabilitation

## 2024-03-15 ENCOUNTER — Encounter: Payer: Self-pay | Admitting: Family Medicine

## 2024-03-16 ENCOUNTER — Telehealth: Payer: Self-pay

## 2024-03-16 ENCOUNTER — Other Ambulatory Visit: Payer: Self-pay | Admitting: Gastroenterology

## 2024-03-16 ENCOUNTER — Encounter: Payer: Self-pay | Admitting: Family Medicine

## 2024-03-16 ENCOUNTER — Encounter: Payer: Self-pay | Admitting: Physical Medicine and Rehabilitation

## 2024-03-16 ENCOUNTER — Encounter: Payer: Self-pay | Admitting: Gastroenterology

## 2024-03-16 DIAGNOSIS — R1310 Dysphagia, unspecified: Secondary | ICD-10-CM

## 2024-03-16 DIAGNOSIS — M25521 Pain in right elbow: Secondary | ICD-10-CM

## 2024-03-16 DIAGNOSIS — K219 Gastro-esophageal reflux disease without esophagitis: Secondary | ICD-10-CM

## 2024-03-16 NOTE — Telephone Encounter (Signed)
 Writer spoke with patient to schedule Manometry only per referral form from Dorn Bertrand, MD. Writer explained the procedure to patient. Writer instructed patient is to be NPO 6 hours prior to procedure. Patient denied taking blood thinner medication. [facility]ility lab will call one week prior to give instructions. Writer confirmed address to send instructions in the mail. Patient agreed to be scheduled for Friday 03/24/24 @ 1:40pm. Writer will notify Dr. Trudy secretary to schedule patient.

## 2024-03-17 ENCOUNTER — Ambulatory Visit: Admitting: Family Medicine

## 2024-03-17 ENCOUNTER — Telehealth: Payer: Self-pay

## 2024-03-17 NOTE — Telephone Encounter (Signed)
 error

## 2024-03-21 NOTE — Telephone Encounter (Signed)
 Referral updated

## 2024-03-22 ENCOUNTER — Ambulatory Visit: Admitting: Physical Medicine and Rehabilitation

## 2024-03-22 ENCOUNTER — Other Ambulatory Visit: Payer: Self-pay

## 2024-03-23 ENCOUNTER — Ambulatory Visit: Admitting: Family Medicine

## 2024-03-23 ENCOUNTER — Encounter: Payer: Self-pay | Admitting: Obstetrics and Gynecology

## 2024-03-23 ENCOUNTER — Other Ambulatory Visit: Payer: Self-pay

## 2024-03-23 ENCOUNTER — Ambulatory Visit: Attending: Obstetrics and Gynecology | Admitting: Obstetrics and Gynecology

## 2024-03-23 VITALS — BP 125/79 | HR 80 | Wt 135.7 lb

## 2024-03-23 DIAGNOSIS — R0789 Other chest pain: Secondary | ICD-10-CM

## 2024-03-23 DIAGNOSIS — S53401S Unspecified sprain of right elbow, sequela: Secondary | ICD-10-CM

## 2024-03-23 DIAGNOSIS — R232 Flushing: Secondary | ICD-10-CM

## 2024-03-23 DIAGNOSIS — F419 Anxiety disorder, unspecified: Secondary | ICD-10-CM

## 2024-03-23 DIAGNOSIS — Z79890 Hormone replacement therapy (postmenopausal): Secondary | ICD-10-CM

## 2024-03-23 DIAGNOSIS — G479 Sleep disorder, unspecified: Secondary | ICD-10-CM

## 2024-03-23 DIAGNOSIS — Z780 Asymptomatic menopausal state: Secondary | ICD-10-CM

## 2024-03-23 MED ORDER — GABAPENTIN 100 MG PO CAPSULE *I*: 100.0000 mg | ORAL_CAPSULE | Freq: Every evening | ORAL | 1 refills | Status: DC

## 2024-03-23 MED ORDER — SERTRALINE HCL 25 MG PO TABS *I*: 25.0000 mg | ORAL_TABLET | Freq: Every day | ORAL | 2 refills | Status: DC

## 2024-03-23 NOTE — Progress Notes (Signed)
 Video Visit Mode of Communication with Patient for This Visit  Mode of Communication with Patient for This Visit: VideoPatient Location: HomeProvider Location: Clinic  Subjective: Hormone replacement therapy HPIKimberly has noted hot flashes, night sweats on some nights. She did decrease to one patch a week with her estradiol  patch and found bleeding remitted. She stopped use of testosterone  as she did not feel that helped her very much. She has been having sleep interruption, having to get up to change clothes with night sweats She,currently, has no vaginal bleeding She did see Dr Recardo today and had a sertraline  increase by 25 mg. Gail Jensen  has a past medical history of Anxiety, Arthritis, Celiac disease, Depression, and GERD (gastroesophageal reflux disease).Gail Jensen has Lumbar radiculopathy; Attention deficit hyperactivity disorder (ADHD); Celiac disease; Gastroesophageal reflux disease; Psoriasis of scalp; Bipolar disorder; Asthma; OSA (obstructive sleep apnea); and PAC (premature atrial contraction) on their problem list.Gail Jensen  has a past surgical history that includes Cesarean Section, Unspecified (1996); Breast enhancement surgery (Bilateral, 11/1998); laminectomy (08/2021); pr arthrodesis combined tq 1ntrspc lumbar (N/A, 03/16/2022); pr reinsertion spinal fixation device (N/A, 04/01/2022); Eye surgery; Spine surgery (09-05-21, Sept. 2023, Oct. 2024); and pr dise dyn eval sleep disordered breathing flx dx (N/A, 06/11/2023).Gail Jensen has a current medication list which includes the following prescription(s): sertraline , gabapentin , amphetamine -dextroamphetamine , trazodone , medroxyprogesterone , estradiol , sertraline , famotidine , clobetasol , generic dme, methocarbamol , lamotrigine , lidocaine , NONFORMULARY, OTHER, ORDER *I*, and daily-vite multivitamin.Review of Systems Constitutional: Negative for fatigue and unexpected weight change. Endocrine: As per HPI with  night sweats/hot flashesGenitourinary: No noted break through bleedingPsychiatric/Behavioral: Negative.    Objective:Constitutional: She is oriented to person, place, and time. She appears well-developed and well-nourished. No distress. Neurological: She is alert and oriented to person, place, and time. Psychiatric: She has a normal mood and affect. Her behavior is normal. Judgment and thought content normal. There were no vitals filed for this visit.  Assessment:PostmenopauseHormone Replacement Therapy Night sweats/hot flashesSleep disturbancesPlan:Will change to 1/2 estradiol  0.05 mg patch twice a week and call when she has completed the patches she has at home, will change to estradiol  0.025 mg patch twice a week if she is feeling well.Discussed the importance of keeping estradiol  levels even throughout the weekMedroxyprogesterone 2.5 mg one po dailyGabapentin 100 mg to use 1 to 3 capsules nightly for hot flashes/sleep Return to office: PRN or 6 month medication check, encouraged to let us  know if concerns with decrease in dose or with use of gabapentinConsent was obtained from the patient to complete this telemedicine visit; including the potential for financial liability.

## 2024-03-23 NOTE — Progress Notes (Unsigned)
 [facility] MedicineWeb[facility] MedicineSubjective [employer], 01/23/1961, Gail presents for Follow-up (3 Mon ) History of Present IllnessThe patient presents for evaluation of shortness of breath, chest pain, and depression.She reports experiencing unexplained shortness of breath that has progressively worsened, accompanied by chest pain and difficulty breathing. These symptoms began last year. Additionally, she experiences palpitations, but cardiac evaluations have been normal. She sought help at an urgent care facility but was turned away due to their policy against walk-ins. She is requesting an inhaler to help with her breathing. Her blood pressure readings have been in the 140s. A recent episode occurred on a Tuesday while driving home, where she felt unwell, had no fever, but experienced a loss of appetite. She describes her symptoms as intermittent and severe, likening the sensation to being on a roller coaster. She has been taking magnesium  supplements at night and has not noticed any correlation between her symptoms and certain days of the week.She is considering resuming Cymbalta, which she found beneficial for both pain and depression in the past. She has been prescribed a muscle relaxer, which has not been effective. Currently, she is on sertraline  100 mg, which she finds helpful. She has previously tried Cymbalta twice and found it effective. She was switched from Cymbalta to sertraline  by her gynecologist at age 63 due to menopausal symptoms. She has misplaced her sertraline  prescription twice and had to halve the dose, resulting in increased irritability. She is contemplating increasing her sertraline  dose. Additionally, she takes Adderall from Monday to Thursday for work.She experiences constipation and uses MiraLAX  in her morning coffee.She had an endoscopy performed by Dr. Vergil. She had 6 stitches removed from her leg due to skin cancer. She is currently on  lamotrigine  for bipolar disorder and sertraline  for menopause. She has a telehealth appointment with her gynecologist at 3:00 PM today.Objective VitalsBlood pressure 125/79, pulse 80, weight 61.6 kg (135 lb 11.2 oz), SpO2 96%.Physical ExamExtremities: Deformity noted in the right elbow with muscle atrophy where there is MRI evidence now of full thickness tear of the radial collateral ligament and high-grade partial thickness tear of the common extensor tendon.Gen: well kempt, low BMIResultsImaging - MRI of the elbow: Full thickness tear in the radial collateral ligament and high grade partial thickness tearing of the common extensor tendon.GENERAL CHEMISTRY Recent Labs   05/09/251543 12/16/241442 12/11/241421 NA 140 140 139 K 4.4 4.1 4.5 CL 102 102 103 CO2 23 23 27  GAP 15 15 9  UN 18 17 12  CREAT 0.93 0.87 0.77 GLU 97 98 98 CA 9.3 9.9 9.6  LIPID PROFILE Recent Labs   12/11/241421 CHOL 210* TRIG 106 HDL 79* LDLC 110 NHDLC 131 CHHDC 2.7  LIVER PROFILE Recent Labs   05/09/251543 12/11/241421 ALT 26 25 AST 22 22 ALK 48 56 TB 0.3 0.2  DIABETES THYROID  Recent Labs   03/17/251543 12/11/241421 HA1C 5.7* 5.6  No value within the past 365 days  Assessment & Plan Assessment & Plan1. Shortness of breath and spasm-like mid sternal chest pain:- Symptoms suggest a possible esophageal spasm, which could be causing musculoskeletal discomfort. Continue magnesium  , muscle relaxer as needed- she has manometry and Pharyngeal xray planned with GI- An inhaler will be prescribed for use as needed. She is advised to monitor her heart rate on days when she takes Adderall.2. Depression:- Reports feeling sad and not wanting to leave the house.- Discussed increasing the dosage of sertraline  by 25 mg to see if it helps with her symptoms. If there is no improvement, she may consider  switching back to Cymbalta.-  Increase sertraline  dosage by 25 mg.3. Constipation:- Experiences chronic constipation.- Advised to increase magnesium  intake to 3 tablets at night to help with muscle spasms and constipation. Uses MiraLAX  in her morning coffee.1. Anxiety (Primary)- sertraline  increase from 61-75mg  daiy to help with anxiety and stress due to her current medical concerns- if this doesn't help, will work with her to attempt transition to DuloxetineAuthor: SALLYANNE MOULDER, MD  Note signed: 03/23/2024

## 2024-03-24 ENCOUNTER — Encounter: Payer: Self-pay | Admitting: Gastroenterology

## 2024-03-24 ENCOUNTER — Encounter: Payer: Self-pay | Admitting: Thoracic/Foregut Surgery

## 2024-03-24 ENCOUNTER — Ambulatory Visit
Admission: RE | Admit: 2024-03-24 | Discharge: 2024-03-24 | Disposition: A | Discharge status: Home / Self Care | Source: Ambulatory Visit | Attending: Surgery | Admitting: Surgery

## 2024-03-24 VITALS — BP 117/57 | Temp 97.9°F | Resp 20 | Ht 68.5 in | Wt 133.0 lb

## 2024-03-24 DIAGNOSIS — K900 Celiac disease: Secondary | ICD-10-CM | POA: Insufficient documentation

## 2024-03-24 DIAGNOSIS — Z800 Family history of malignant neoplasm of digestive organs: Secondary | ICD-10-CM | POA: Insufficient documentation

## 2024-03-24 DIAGNOSIS — R1310 Dysphagia, unspecified: Secondary | ICD-10-CM | POA: Insufficient documentation

## 2024-03-24 DIAGNOSIS — K219 Gastro-esophageal reflux disease without esophagitis: Secondary | ICD-10-CM | POA: Insufficient documentation

## 2024-03-24 DIAGNOSIS — Z8379 Family history of other diseases of the digestive system: Secondary | ICD-10-CM | POA: Insufficient documentation

## 2024-03-24 HISTORY — DX: Sleep apnea, unspecified: G47.30

## 2024-03-24 HISTORY — DX: Dependence on other enabling machines and devices: Z99.89

## 2024-03-24 MED ORDER — LIDOCAINE VISCOUS 2 % MT SOLN *I*
OROMUCOSAL | Status: AC | PRN
Start: 2024-03-24 — End: 2024-03-24
  Administered 2024-03-24: 5 mL via OROMUCOSAL

## 2024-03-24 NOTE — Progress Notes (Signed)
 Confirmed NPO since last nightManometry explained and questions answered. Lidocaine  2% jelly instilled into right naresManometry catheter passed to 51 cm  without difficulty.Manometry with impedance completed with head of bed elevated approximately 30 degrees.Post procedure patient no complaints of sore throat, epistaxis, shortness of breath or pain/discomfort.Patient was discharged to home at [id number]

## 2024-03-24 NOTE — Discharge Instructions (Signed)
 Discharge Instructions Manometry.Return to your normal diet, medications and activities.You may have a mild sore throat. Take tylenol  as needed.For non-urgent questions or concerns Mon-Fri 8:00-4:30: contact us  at [phone] serious problem after hours: call [phone] to contact Dr. Joshua, Dr. Hinton, Dr. Gable Dr. Debora, Dr. Sharie or Dr. Delena

## 2024-03-27 ENCOUNTER — Telehealth: Payer: Self-pay

## 2024-03-27 NOTE — Telephone Encounter (Signed)
 Called patient for post-procedure check-in. Left message for pt to call back at [phone] with any questions or concerns.

## 2024-04-10 ENCOUNTER — Other Ambulatory Visit: Payer: Self-pay | Admitting: Family Medicine

## 2024-04-10 ENCOUNTER — Other Ambulatory Visit: Payer: Self-pay | Admitting: Physical Medicine and Rehabilitation

## 2024-04-10 DIAGNOSIS — F909 Attention-deficit hyperactivity disorder, unspecified type: Secondary | ICD-10-CM

## 2024-04-10 MED ORDER — METHOCARBAMOL 500 MG PO TABS *I*: ORAL_TABLET | ORAL | 0 refills | Status: DC

## 2024-04-11 MED ORDER — AMPHETAMINE-DEXTROAMPHETAMINE 15 MG PO TABS *I*: 15.0000 mg | ORAL_TABLET | Freq: Every day | ORAL | 0 refills | Status: DC

## 2024-04-11 NOTE — Telephone Encounter (Signed)
 Last office visit: Last Office Visit   Date Provider Department Visit Type Primary Dx  03/23/2024 Gail Braun, MD [facility] Office Visit Anxiety   Last PA Office Visit  None  Patients upcoming appointments:Future Appointments Date Time Provider Department Center 09/08/2024  1:00 PM Gail Braun, MD [facility] None Recent Lab results:GENERAL CHEMISTRY Recent Labs   05/09/251543 12/16/241442 12/11/241421 NA 140 140 139 K 4.4 4.1 4.5 CL 102 102 103 CO2 23 23 27  GAP 15 15 9  UN 18 17 12  CREAT 0.93 0.87 0.77 GLU 97 98 98 CA 9.3 9.9 9.6  LIPID PROFILE Recent Labs   12/11/241421 CHOL 210* TRIG 106 HDL 79* LDLC 110  LIVER PROFILE Recent Labs   05/09/251543 12/11/241421 ALT 26 25 AST 22 22 ALK 48 56 TB 0.3 0.2  DIABETES THYROID  Recent Labs   03/17/251543 12/11/241421 HA1C 5.7* 5.6  No value within the past 365 days  Pending/Orders Labs:Lab Frequency Next Occurrence Mammography screening BILATERAL Once 09/06/2023 CV US  Doppler Vein Upper Extremity Right Once 03/24/2024 Pharyngogram Once 03/17/2024 Esophageal Manometry Once 03/24/2024  Opioid Drug Screen:  Lab results: 06/12/251754 Amphetamine ,UR POS Cocaine/Metab,UR NEG Opiates,UR NEG THC Metabolite,UR POS Benzodiazepinen,UR NEG Confirm THC Metab POS Last dispensed if controlled:

## 2024-04-11 NOTE — Telephone Encounter (Signed)
 Gail Jensen [facility] [name] 11/20/1960 F [address] [place], [place] [address] [place] [address]Prescription Information: SearchPatient_1 09/23/2023 09/24/2023 dextroamp-amphet er 10 mg cap 30 30 Henry Braun [facility] [id number] 07/22/2023 07/23/2023 dextroamp-amphet er 10 mg cap 30 30 Wallie Norman Notch, (MD) [facility] [id number] 05/28/2023 06/02/2023 dextroamp-amphet er 10 mg cap 30 30 Henry Braun [facility] [id number] 02/25/2024 02/26/2024 dextroamp-amphetamin 15 mg tab 30 30 Tomkinson, Emilie [employer] #[id number] 02/15/2024 02/15/2024 hydrocodone-acetaminophen  5-325 mg tablet 15 2 Wilhelmena Rockey ORN (PA-C) [employer] #[id number] 01/07/2024 01/10/2024 dextroamp-amphetamin 10 mg tab 30 30 Tomkinson, Emilie [employer] #[id number]_7 01/10/2024 01/10/2024 dextroamp-amphetamin 15 mg tab 30 30 Tomkinson, Emilie [employer] #[id number]_8 12/06/2023 12/06/2023 dextroamp-amphetamin 10 mg tab 30 30 Tomkinson, Emilie [employer] #[id number] 07/19/2023 07/30/2023 testosterone  micronized powder 0 90 [name] MD [facility]

## 2024-05-01 ENCOUNTER — Encounter: Payer: Self-pay | Admitting: Family Medicine

## 2024-05-02 ENCOUNTER — Other Ambulatory Visit: Payer: Self-pay | Admitting: Obstetrics and Gynecology

## 2024-05-02 NOTE — Telephone Encounter (Signed)
 Refill request received from patients pharmacy for medroxyprogesterone  2.5mg  tablets and estradiol  0.05mg  twice weekly patch. Pt last seen on 03/23/24 for med check via Zoom. Med pended if ok.

## 2024-05-08 ENCOUNTER — Other Ambulatory Visit: Payer: Self-pay | Admitting: Family Medicine

## 2024-05-09 MED ORDER — LAMOTRIGINE 100 MG PO TABS *I*: 100.0000 mg | ORAL_TABLET | Freq: Every evening | ORAL | 1 refills | Status: DC

## 2024-05-09 NOTE — Telephone Encounter (Signed)
 Last office visit: Last Office Visit   Date Provider Department Visit Type Primary Dx  03/23/2024 Henry Braun, MD [facility] Office Visit Anxiety   Last PA Office Visit  None  Patients upcoming appointments:Future Appointments Date Time Provider Department Center 09/08/2024  1:00 PM Henry Braun, MD [facility] None Recent Lab results:GENERAL CHEMISTRY Recent Labs   05/09/251543 12/16/241442 12/11/241421 NA 140 140 139 K 4.4 4.1 4.5 CL 102 102 103 CO2 23 23 27  GAP 15 15 9  UN 18 17 12  CREAT 0.93 0.87 0.77 GLU 97 98 98 CA 9.3 9.9 9.6  LIPID PROFILE Recent Labs   12/11/241421 CHOL 210* TRIG 106 HDL 79* LDLC 110  LIVER PROFILE Recent Labs   05/09/251543 12/11/241421 ALT 26 25 AST 22 22 ALK 48 56 TB 0.3 0.2  DIABETES THYROID  Recent Labs   03/17/251543 12/11/241421 HA1C 5.7* 5.6  No value within the past 365 days  Pending/Orders Labs:Lab Frequency Next Occurrence Mammography screening BILATERAL Once 09/06/2023 CV US  Doppler Vein Upper Extremity Right Once 03/24/2024 Pharyngogram Once 03/17/2024 Esophageal Manometry Once 03/24/2024  Opioid Drug Screen:  Lab results: 06/12/251754 Amphetamine ,UR POS Cocaine/Metab,UR NEG Opiates,UR NEG THC Metabolite,UR POS Benzodiazepinen,UR NEG Confirm THC Metab POS Last dispensed if controlled:

## 2024-05-10 ENCOUNTER — Telehealth: Payer: Self-pay

## 2024-05-10 NOTE — Telephone Encounter (Signed)
 TC to patient to schedule one-time medication consultation appointment as PCP will prescribe. Patient was unable to talk and requested a call at 4pm.

## 2024-05-11 NOTE — Progress Notes (Signed)
 Mental Health and Wellness - Intake Telephone Screen Outpatient Psychiatric Consultation Liaison ServicePatient's Name: Gail BlackCougarPatient's Date of Birth: 07-11-1962Patient MRN: [id number] Patient's Phone Number: [phone] Contact Name and Phone Number:Michelle [phone] your mental health care be covered by any of the following coverages: NOPatient's PCP: Tomkinson, Emilie, MDWho will be the prescriber of medications after completion of the consultation visit?: PCPVerified by nursing on: via inbasket message 11/19Referring Service: Self   Presenting Concern:  Per patient: Patient states that in August-September having a crazy amount of anxiety which lead to feeling blue. Patient followed up with PCP who increased Zoloft  from 100mg  to 125mg , but patient is unsure if that is helpful due to diagnosis of Bipolar. Patient also reported some financial stressors but have somewhat been alleviated due to cutting off communication with ex-husband. Patient is also concerned that the dark weather may be contributing to worsening symptoms, as [place]. Symptoms include: irritability (even though this has decreased over last month), being overly emotional, and having a fleeting thought what if I wasn't here. Patient reports thought did not go passed that and did not happen again.Is the patient having current suicidal or homicidal thoughts? No Has the patient experienced hearing voices or seeing things that are not there (i.e. Hallucinations)? NoIs the patient currently involved in mental health treatment? No, the patient has participated in any past mental health treatment.  HX: Medication consultation with Dr. Viola on 04/30/2023, [place] and therapist [place] patient currently prescribed psychiatric medications? If yes, please list medication: Yes, Adderall 15mg  daily, Lamictal   100mg  at bedtime, Zoloft  125mg  daily, trazodone  150mg  nightly HX: Strattera, Valium, methylphenidate, Prazosin, propranolol , gabapentin  (not effective)In a typical week how often does the patient drink alcohol? Yes, patient drinks about 4 glasses of wine a week HX: Was drinking 2 glasses of wine with dinner nightly (14 glasses a week)Does the patient use recreational drugs? No  Does the patient have any significant medical conditions related to the reasons for seeking mental health treatment at this time? NoPt has given medication consultation clinic verbal consent to communicate and share information with pt's PCP, Psych providers, therapists. Yes, verbal consent obtained Appointment SchedulingPsychiatric consultation scheduled with Dr. Ranae date/time: 12/10 at 1:30pm [place] [facility] attending telehealth consults have been informed they need to be in [place] in order to complete [place]health visit. Patient informed if outside [place] at time of appointment, appointment will need to be rescheduled.

## 2024-05-12 ENCOUNTER — Telehealth: Payer: Self-pay | Admitting: Thoracic/Foregut Surgery

## 2024-05-12 NOTE — Telephone Encounter (Signed)
 Returned patient's call and let her know we cannot send it through MyChart due to the size of the file. She is aware it is completed and just needs to be read by Dr. Hinton. Patient was appreciative of the call. Discussed with Madelin Lewandowsky, NP and will fax preliminary report over for her appointment.

## 2024-05-12 NOTE — Telephone Encounter (Signed)
 Patient called our office looking to get a copy of her mano procedure results from 10/3. She said she read out to Dr. Nolberto office and they did not have a copy either. Is there any way these can be sent to her Mychart?

## 2024-05-15 ENCOUNTER — Encounter: Payer: Self-pay | Admitting: Family Medicine

## 2024-05-15 ENCOUNTER — Encounter: Payer: Self-pay | Admitting: Gastroenterology

## 2024-05-19 ENCOUNTER — Other Ambulatory Visit: Payer: Self-pay | Admitting: Family Medicine

## 2024-05-19 DIAGNOSIS — F909 Attention-deficit hyperactivity disorder, unspecified type: Secondary | ICD-10-CM

## 2024-05-22 NOTE — Telephone Encounter (Signed)
 Last office visit: Last Office Visit   Date Provider Department Visit Type Primary Dx  03/23/2024 Henry Braun, MD [facility] Office Visit Anxiety   Last PA Office Visit  None  Patients upcoming appointments:Future Appointments Date Time Provider Department Center 05/31/2024  1:30 PM Viola Barrier, MD [facility] None 09/08/2024  1:00 PM Henry Braun, MD [facility] None Recent Lab results:GENERAL CHEMISTRY Recent Labs   05/09/251543 12/16/241442 12/11/241421 NA 140 140 139 K 4.4 4.1 4.5 CL 102 102 103 CO2 23 23 27  GAP 15 15 9  UN 18 17 12  CREAT 0.93 0.87 0.77 GLU 97 98 98 CA 9.3 9.9 9.6  LIPID PROFILE Recent Labs   12/11/241421 CHOL 210* TRIG 106 HDL 79* LDLC 110  LIVER PROFILE Recent Labs   05/09/251543 12/11/241421 ALT 26 25 AST 22 22 ALK 48 56 TB 0.3 0.2  DIABETES THYROID  Recent Labs   03/17/251543 12/11/241421 HA1C 5.7* 5.6  No value within the past 365 days  Pending/Orders Labs:Lab Frequency Next Occurrence Mammography screening BILATERAL Once 09/06/2023 CV US  Doppler Vein Upper Extremity Right Once 03/24/2024 Pharyngogram Once 03/17/2024 Esophageal Manometry Once 03/24/2024  Opioid Drug Screen:  Lab results: 06/12/251754 Amphetamine ,UR POS Cocaine/Metab,UR NEG Opiates,UR NEG THC Metabolite,UR POS Benzodiazepinen,UR NEG Confirm THC Metab POS Last dispensed if controlled:

## 2024-05-22 NOTE — Telephone Encounter (Signed)
 Patient Demographic Information [facility])   [facility] First Name Last Name Birth Date Gender Street Address [name] 08/30/1960 Female [address] [place] [name] 07-28-1960 [address] [place] [address]Prescription Information  [facility] Filter:  [facility] My Rx Current Rx Drug Type Rx Written Rx Dispensed Drug Maree LOISE LOISE GORMAN 09/23/2023 09/24/2023 dextroamp-amphet er 10 mg cap 30Days Supply 30Prescriber Name Tomkinson, EmiliePrescriber DEA # [id number] Payment Method InsuranceDispenser [facility] [id number] CINDERELLA LOISE GORMAN 07/22/2023 07/23/2023 dextroamp-amphet er 10 mg cap 30Days Supply 30Prescriber Name Wallie Norman Notch, (MD)Prescriber DEA # [id number] Payment Method InsuranceDispenser [facility] [phone] N N S 05/28/2023 06/02/2023 dextroamp-amphet er 10 mg cap 30Days Supply 30Prescriber Name Henry Glad [facility] # [id number] Payment Method InsuranceDispenser [facility] [phone] N N S 04/11/2024 04/16/2024 dextroamp-amphetamin 15 mg tab 30Days Supply 30Prescriber Name Henry EmiliePrescriber DEA # [id number] Payment Method InsuranceDispenser [employer] #06B N N S 02/25/2024 02/26/2024 dextroamp-amphetamin 15 mg tab 30Days Supply 30Prescriber Name Henry EmiliePrescriber DEA # [id number] Payment Method InsuranceDispenser [employer] #06B N N O 02/15/2024 02/15/2024 hydrocodone-acetaminophen  5-325 mg tablet 15Days Supply 2Prescriber Name Wilhelmena Rockey ORN (PA-C)Prescriber DEA # [id number] Payment Method InsuranceDispenser [employer] [facility]06B N N S 01/07/2024 01/10/2024 dextroamp-amphetamin 10 mg tab 30Days Supply 30Prescriber Name Henry EmiliePrescriber DEA # [id number] Payment Method InsuranceDispenser [employer] [facility]06B N N S 01/10/2024 01/10/2024 dextroamp-amphetamin 15 mg  tab 30Days Supply 30Prescriber Name Henry EmiliePrescriber DEA # [id number] Payment Method InsuranceDispenser [employer] [facility]06B N N S 12/06/2023 12/06/2023 dextroamp-amphetamin 10 mg tab 30Days Supply 30Prescriber Name Tomkinson, EmiliePrescriber DEA # [id number] Payment Method InsuranceDispenser [employer] #06B N N  07/19/2023 07/30/2023 testosterone  micronized powder * 0gmDays Supply 90Prescriber Name [name] MDPrescriber DEA # [id number] Payment Method CashDispenser [facility]

## 2024-05-24 ENCOUNTER — Other Ambulatory Visit: Payer: Self-pay | Admitting: Gastroenterology

## 2024-05-24 DIAGNOSIS — R1310 Dysphagia, unspecified: Secondary | ICD-10-CM

## 2024-05-24 DIAGNOSIS — K900 Celiac disease: Secondary | ICD-10-CM

## 2024-05-28 ENCOUNTER — Encounter: Payer: Self-pay | Admitting: Family Medicine

## 2024-05-28 DIAGNOSIS — F909 Attention-deficit hyperactivity disorder, unspecified type: Secondary | ICD-10-CM

## 2024-05-29 MED ORDER — LISDEXAMFETAMINE DIMESYLATE 20 MG PO CAPS *I*: 20.0000 mg | ORAL_CAPSULE | Freq: Every morning | ORAL | 0 refills | Status: DC

## 2024-05-30 ENCOUNTER — Other Ambulatory Visit: Payer: Self-pay

## 2024-05-30 ENCOUNTER — Other Ambulatory Visit: Payer: Self-pay | Admitting: Gastroenterology

## 2024-05-31 ENCOUNTER — Encounter: Payer: Self-pay | Admitting: Psychiatry

## 2024-05-31 ENCOUNTER — Ambulatory Visit: Attending: Psychiatry | Admitting: Psychiatry

## 2024-05-31 VITALS — BP 129/57 | HR 74

## 2024-05-31 DIAGNOSIS — F3132 Bipolar disorder, current episode depressed, moderate: Secondary | ICD-10-CM | POA: Insufficient documentation
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
## 2024-06-01 ENCOUNTER — Encounter: Payer: Self-pay | Admitting: Family Medicine

## 2024-06-03 ENCOUNTER — Other Ambulatory Visit: Payer: Self-pay | Admitting: Family Medicine

## 2024-06-03 DIAGNOSIS — F419 Anxiety disorder, unspecified: Secondary | ICD-10-CM

## 2024-06-04 ENCOUNTER — Encounter: Payer: Self-pay | Admitting: Family Medicine

## 2024-06-04 ENCOUNTER — Encounter: Payer: Self-pay | Admitting: Orthopaedic Surgery

## 2024-06-05 ENCOUNTER — Other Ambulatory Visit
Admission: RE | Admit: 2024-06-05 | Discharge: 2024-06-05 | Disposition: A | Discharge status: Home / Self Care | Source: Ambulatory Visit | Attending: Gastroenterology | Admitting: Gastroenterology

## 2024-06-05 DIAGNOSIS — K900 Celiac disease: Secondary | ICD-10-CM | POA: Insufficient documentation

## 2024-06-05 DIAGNOSIS — R1310 Dysphagia, unspecified: Secondary | ICD-10-CM | POA: Insufficient documentation

## 2024-06-05 LAB — PROTIME-INR
INR: 1 (ref 0.9–1.1)
Protime: 10.8 s (ref 10.0–12.9)

## 2024-06-05 LAB — IGA: IgA: 157 mg/dL (ref 70–400)

## 2024-06-05 LAB — TIBC
Iron: 53 ug/dL (ref 34–165)
TIBC: 321 ug/dL (ref 250–450)
Transferrin Saturation: 17 % (ref 15–50)

## 2024-06-05 LAB — FERRITIN: Ferritin: 34 ng/mL (ref 10–120)

## 2024-06-05 LAB — VITAMIN B12: Vitamin B12: 987 pg/mL (ref 232–1245)

## 2024-06-05 LAB — RED BLOOD COUNT: RBC: 3.9 MIL/uL — ABNORMAL LOW (ref 4.0–5.5)

## 2024-06-05 LAB — FOLATE: Folate: 13.3 ng/mL (ref >=4.6)

## 2024-06-05 MED ORDER — LAMOTRIGINE 100 MG PO TABS *I*: 100.0000 mg | ORAL_TABLET | Freq: Every evening | ORAL | 1 refills | Status: DC

## 2024-06-05 NOTE — Telephone Encounter (Signed)
 Last office visit: Last Office Visit   Date Provider Department Visit Type Primary Dx  03/23/2024 Henry Braun, MD [facility] Office Visit Anxiety   Last PA Office Visit  None  Patients upcoming appointments:Future Appointments Date Time Provider Department Center 09/08/2024  1:00 PM Henry Braun, MD [facility] None Recent Lab results:GENERAL CHEMISTRY Recent Labs   05/09/251543 12/16/241442 NA 140 140 K 4.4 4.1 CL 102 102 CO2 23 23 GAP 15 15 UN 18 17 CREAT 0.93 0.87 GLU 97 98 CA 9.3 9.9  LIPID PROFILE No value within the past 365 days LIVER PROFILE Recent Labs   05/09/251543 ALT 26 AST 22 ALK 48 TB 0.3  DIABETES THYROID  Recent Labs   03/17/251543 HA1C 5.7*  No value within the past 365 days  Pending/Orders Labs:Lab Frequency Next Occurrence Mammography screening BILATERAL Once 09/06/2023 CV US  Doppler Vein Upper Extremity Right Once 03/24/2024 Pharyngogram Once 03/17/2024 Esophageal Manometry Once 03/24/2024 DEXA Scan - Axial, No Trabecular Bone Score Once 05/24/2024  Opioid Drug Screen:  Lab results: 06/12/251754 Amphetamine ,UR POS Cocaine/Metab,UR NEG Opiates,UR NEG THC Metabolite,UR POS Benzodiazepinen,UR NEG Confirm THC Metab POS Last dispensed if controlled:

## 2024-06-06 LAB — TISSUE TRANSGLUT,IGA: tTG,IgA: 4.8 U/mL (ref 0.0–14.9)

## 2024-06-07 ENCOUNTER — Telehealth: Payer: Self-pay | Admitting: Family Medicine

## 2024-06-07 MED ORDER — LAMOTRIGINE 150 MG PO TABS *I': 150.0000 mg | ORAL_TABLET | Freq: Every evening | ORAL | 2 refills | Status: DC

## 2024-06-07 NOTE — Telephone Encounter (Signed)
 Gail Jensen calls regarding Lamotrigine .  She reports that Dr. Sidney wanted her dose to increase to 150 mg.  [facility] had received the refill but it was only  for 100mg .  She is asking for it to be resent: lamotrigine  150 mg to [facility])

## 2024-06-07 NOTE — Telephone Encounter (Signed)
 Pharmacy faxed note stating that per patient, PCP increased dosage on lamoTRIgine  (LAMICTAL ) 100 mg tablet  to 150mg  from 100; if this is correct, please resend new script for updated dosage to reflect 150mg .

## 2024-06-08 LAB — VITAMIN D 1,25 DIHYDROXY: Vit D, 1,25-Dihydroxy: 73.1 pg/mL (ref 19.9–79.3)

## 2024-06-09 LAB — VITAMIN E
Vitamin E (alpha-tocopherol): 9.1 mg/L (ref 5.5–18.0)
Vitamin E (gamma-tocopherol): 0.8 mg/L (ref 0.0–6.0)

## 2024-06-09 LAB — VITAMIN A
Interp, vitamin A: NORMAL
Vitamin A (retinol): 0.55 mg/L (ref 0.30–1.20)
Vitamin A (retinyl palmitate): 0.03 mg/L (ref 0.00–0.10)

## 2024-06-13 MED ORDER — IBUPROFEN 800 MG PO TABS *I*: 800.0000 mg | ORAL_TABLET | Freq: Every day | ORAL | 0 refills | Status: AC | PRN

## 2024-06-15 ENCOUNTER — Other Ambulatory Visit: Payer: Self-pay | Admitting: Physical Medicine and Rehabilitation

## 2024-06-19 ENCOUNTER — Other Ambulatory Visit: Payer: Self-pay | Admitting: Gastroenterology

## 2024-06-20 ENCOUNTER — Encounter: Payer: Self-pay | Admitting: Family Medicine

## 2024-06-20 DIAGNOSIS — F909 Attention-deficit hyperactivity disorder, unspecified type: Secondary | ICD-10-CM

## 2024-06-20 MED ORDER — LISDEXAMFETAMINE DIMESYLATE 30 MG PO CAPS *I*: 30.0000 mg | ORAL_CAPSULE | Freq: Every morning | ORAL | 0 refills | Status: AC

## 2024-06-20 MED ORDER — METHOCARBAMOL 750 MG PO TABS *I*: 750.0000 mg | ORAL_TABLET | Freq: Four times a day (QID) | ORAL | 1 refills | Status: AC

## 2024-06-20 MED ORDER — LAMOTRIGINE 150 MG PO TABS *I': 150.0000 mg | ORAL_TABLET | Freq: Every evening | ORAL | 2 refills | Status: AC

## 2024-06-20 NOTE — Telephone Encounter (Signed)
*  800mg ./Methocarbamol  for my back pain/nerve effective (I will see Dr. Karron in Jan.)*150mg . Lamotrigine  increase effective (Dr. Sidney F.)*  25mg . Sertraline - not needed*  20mg . Vyvanse  - can we increase to 25mg . -working more effectively than the Adderall.There is no Vyvanse  25 mg, or 10 mg to split.Also only found 750 mg of Methocarbamol .

## 2024-06-20 NOTE — Telephone Encounter (Signed)
 Pt is requesting an increase of her Vyvanse ; would you like to see her in the office prior?

## 2024-06-21 ENCOUNTER — Other Ambulatory Visit
Admission: RE | Admit: 2024-06-21 | Discharge: 2024-06-21 | Disposition: A | Discharge status: Home / Self Care | Source: Ambulatory Visit | Attending: Gastroenterology | Admitting: Gastroenterology

## 2024-06-21 DIAGNOSIS — K900 Celiac disease: Secondary | ICD-10-CM | POA: Insufficient documentation

## 2024-06-21 LAB — CBC AND DIFFERENTIAL
Baso # K/uL: 0.1 THOU/uL (ref 0.0–0.2)
Eos # K/uL: 0.3 THOU/uL (ref 0.0–0.5)
Hematocrit: 40 % (ref 34–49)
Hemoglobin: 12.9 g/dL (ref 11.2–16.0)
IMM Granulocytes #: 0 THOU/uL (ref 0–0)
IMM Granulocytes: 0.2 %
Lymph # K/uL: 1.8 THOU/uL (ref 1.0–5.0)
MCV: 92 fL (ref 75–100)
Mono # K/uL: 0.5 THOU/uL (ref 0.1–1.0)
Neut # K/uL: 3.5 THOU/uL (ref 1.5–6.5)
Nucl RBC # K/uL: 0 THOU/uL
Nucl RBC %: 0 /100{WBCs} (ref 0.0–0.2)
Platelets: 241 THOU/uL (ref 150–450)
RBC: 4.4 MIL/uL (ref 4.0–5.5)
RDW: 12.4 % (ref 0.0–15.0)
Seg Neut %: 56.1 %
WBC: 6.2 THOU/uL (ref 3.5–11.0)

## 2024-07-06 ENCOUNTER — Encounter: Payer: Self-pay | Admitting: Family Medicine

## 2024-07-06 MED ORDER — BIMATOPROST 0.03 % EX SOLN *A*: CUTANEOUS | 1 refills | Status: DC

## 2024-07-06 NOTE — Telephone Encounter (Signed)
 Last office visit: Last Office Visit   Date Provider Department Visit Type Primary Dx  03/23/2024 Henry Braun, MD [facility] Office Visit Anxiety   Last PA Office Visit  None  Patients upcoming appointments:Future Appointments Date Time Provider Department Center 09/08/2024  1:00 PM Henry Braun, MD [facility] None Recent Lab results:GENERAL CHEMISTRY Recent Labs   05/09/251543 NA 140 K 4.4 CL 102 CO2 23 GAP 15 UN 18 CREAT 0.93 GLU 97 CA 9.3  LIPID PROFILE No value within the past 365 days LIVER PROFILE Recent Labs   05/09/251543 ALT 26 AST 22 ALK 48 TB 0.3  DIABETES THYROID  Recent Labs   03/17/251543 HA1C 5.7*  No value within the past 365 days  Pending/Orders Labs:Lab Frequency Next Occurrence Mammography screening BILATERAL Once 09/06/2023 CV US  Doppler Vein Upper Extremity Right Once 03/24/2024 Pharyngogram Once 03/17/2024 Esophageal Manometry Once 03/24/2024 DEXA Scan - Axial, No Trabecular Bone Score Once 05/24/2024  Opioid Drug Screen:  Lab results: 06/12/251754 Amphetamine ,UR POS Cocaine/Metab,UR NEG Opiates,UR NEG THC Metabolite,UR POS Benzodiazepinen,UR NEG Confirm THC Metab POS Last dispensed if controlled:

## 2024-07-11 ENCOUNTER — Encounter: Payer: Self-pay | Admitting: Family Medicine

## 2024-07-11 MED ORDER — BIMATOPROST 0.03 % EX SOLN *A*: CUTANEOUS | 1 refills | Status: AC

## 2024-07-11 NOTE — Telephone Encounter (Signed)
 Referral pended. Let me know which labs would be appropriate

## 2024-07-11 NOTE — Telephone Encounter (Signed)
 Pended

## 2024-07-12 ENCOUNTER — Other Ambulatory Visit: Payer: Self-pay

## 2024-07-13 ENCOUNTER — Other Ambulatory Visit
Admission: RE | Admit: 2024-07-13 | Discharge: 2024-07-13 | Disposition: A | Discharge status: Home / Self Care | Source: Ambulatory Visit | Attending: Family Medicine | Admitting: Family Medicine

## 2024-07-13 ENCOUNTER — Encounter: Payer: Self-pay | Admitting: Family Medicine

## 2024-07-13 ENCOUNTER — Ambulatory Visit: Attending: Primary Care | Admitting: Family Medicine

## 2024-07-13 VITALS — BP 125/79 | HR 73 | Temp 98.4°F | Wt 139.9 lb

## 2024-07-13 DIAGNOSIS — M79642 Pain in left hand: Secondary | ICD-10-CM

## 2024-07-13 DIAGNOSIS — R7301 Impaired fasting glucose: Secondary | ICD-10-CM

## 2024-07-13 DIAGNOSIS — M2550 Pain in unspecified joint: Secondary | ICD-10-CM | POA: Insufficient documentation

## 2024-07-13 DIAGNOSIS — M79641 Pain in right hand: Secondary | ICD-10-CM

## 2024-07-13 LAB — COMPREHENSIVE METABOLIC PANEL
ALT: 22 U/L (ref 0–35)
AST: 23 U/L (ref 0–35)
Albumin: 4.9 g/dL (ref 3.5–5.2)
Alk Phos: 50 U/L (ref 35–105)
Anion Gap: 12 (ref 7–16)
Bilirubin,Total: 0.3 mg/dL (ref 0.0–1.2)
CO2: 26 mmol/L (ref 20–28)
Calcium: 9.4 mg/dL (ref 8.6–10.2)
Chloride: 100 mmol/L (ref 96–108)
Creatinine: 0.77 mg/dL (ref 0.51–0.95)
Glucose: 99 mg/dL (ref 60–99)
Lab: 18 mg/dL (ref 6–20)
Potassium: 3.9 mmol/L (ref 3.3–5.1)
Sodium: 138 mmol/L (ref 133–145)
Total Protein: 6.8 g/dL (ref 6.3–7.7)
eGFR BY CREAT: 87

## 2024-07-13 LAB — CBC AND DIFFERENTIAL
Baso # K/uL: 0.1 THOU/uL (ref 0.0–0.2)
Eos # K/uL: 0.2 THOU/uL (ref 0.0–0.5)
Hematocrit: 39 % (ref 34–49)
Hemoglobin: 12.3 g/dL (ref 11.2–16.0)
IMM Granulocytes #: 0 THOU/uL (ref 0–0)
IMM Granulocytes: 0.3 %
Lymph # K/uL: 1.9 THOU/uL (ref 1.0–5.0)
MCV: 91 fL (ref 75–100)
Mono # K/uL: 0.4 THOU/uL (ref 0.1–1.0)
Neut # K/uL: 4.8 THOU/uL (ref 1.5–6.5)
Nucl RBC # K/uL: 0 THOU/uL
Nucl RBC %: 0 /100{WBCs} (ref 0.0–0.2)
Platelets: 293 THOU/uL (ref 150–450)
RBC: 4.2 MIL/uL (ref 4.0–5.5)
RDW: 12.6 % (ref 0.0–15.0)
Seg Neut %: 65.8 %
WBC: 7.3 THOU/uL (ref 3.5–11.0)

## 2024-07-13 LAB — C4 COMPLEMENT: C4: 13 mg/dL (ref 10–40)

## 2024-07-13 LAB — SEDIMENTATION RATE, AUTOMATED: Sedimentation Rate: <=1 mm/h (ref 0–30)

## 2024-07-13 LAB — C3 COMPLEMENT: C3: 110 mg/dL (ref 90–180)

## 2024-07-13 LAB — CRP: CRP: <3 mg/L (ref 0–8)

## 2024-07-13 LAB — URIC ACID: Urate: 4.5 mg/dL (ref 2.7–6.8)

## 2024-07-13 NOTE — Patient Instructions (Signed)
 [facility]  [address]   [place], [place] [address]    Phone [phone]    Hours: 8:30 am-12:30 pm              Closed 12:30 -1:30 for lunch              1:30 pm-4:30 pm

## 2024-07-13 NOTE — Progress Notes (Signed)
 Subjective: Patient ID: Gail Jensen is a 64 y.o. female.HPIHistory of Present IllnessThe patient is a 64 year old female who presents today with concerns of a possible autoimmune disorder.She reports persistent morning stiffness in her hands and other areas, which has been a long-standing issue. Painful joints and swelling in her hand have made it difficult for her to get out of bed. Additionally, she experiences numbness and tingling in her extremities, primarily in her fingers, which she initially attributed to a tendon issue. She has a history of a fully torn tendon, the cause of which remains unknown. She received an injection from an orthopedic clinician yesterday and has been advised to wear a brace for 4 weeks. Surgery is scheduled next, but she is planning a significant life change in February or March 2026, involving a major downsize of her living situation.Family history includes her grandmother having osteoarthritis and her father having arthritis. She has not had gout before and is unaware of any family history of gout. She moved back to the area almost 3 years ago from [place]  and is currently renting a house.Hobbies: Yard workLiving Condition: Renting a houseFAMILY HISTORYHer grandmother had osteoarthritis. Her father has arthritis. Gail Jensen has a current medication list which includes the following prescription(s): bimatoprost , methocarbamol , lamotrigine , lisdexamfetamine , ibuprofen , medroxyprogesterone , estradiol , dexlansoprazole, trazodone , sertraline , clobetasol , generic dme, lidocaine , and daily-vite multivitamin.Gail Jensen is allergic to ciprofibrate, ciprofloxacin, sulfa antibiotics, sulfasalazine, and wheat.Review of SystemsAs HPI.   Objective: Physical ExamBP 125/79   Pulse 73   Temp 36.9 C (98.4 F)   Wt 63.5 kg (139 lb 14.4 oz)   SpO2 99%   BMI 20.96 kg/m  General: Well developed, well nourished, no acute  distress.Cardiovascular: Regular rate and rhythm, no murmurs, no rubs, normal S1 and S2.Respiratory: Lungs clear to auscultation, no wheezes, no rhonchi.Hands: Slight edema right hand.  Equal grip strength.  2+ radial pulse.  No erythema or increased warmth.  Tenderness to palpation right 3rd digit.   Assessment:  1. Arthralgia, unspecified joint  CBC and differential  Comprehensive metabolic panel  C reactive protein  Sedimentation rate, automated  Antinuclear antibody screen  Anti DS-dna AB  C3 complement  C4 complement  Uric acid  Rheumatoid factor,screen  Lyme AB Screen  2. Hand pain, left  * Hand LEFT standard PA, Lateral, and Oblique views  3. Hand pain, right  * Hand RIGHT standard PA, Lateral, and Oblique views  4. Impaired fasting glucose  Hemoglobin A1c    Plan:  Assessment & Plan1.  Arthralgia:- Morning stiffness lasting more than an hour, painful joints, and swelling in the hands are reported. Numbness and tingling in the extremities, primarily in the fingers, are also noted.- A comprehensive blood workup will be conducted, including CBC, chemistry panel, inflammation markers, sed rate, CRP, ANA, anti-DNA, C3, C4, uric acid level, rheumatoid factor screen, Lyme screen, and hemoglobin A1c. X-rays of the hands will also be obtained.- She has been advised to wear a brace for 4 weeks following an injection received yesterday. She will be informed of the results upon receipt. 2.  Left hand pain:-Will get x-ray of the left hand to evaluate for nodules and arthritis.3.  Right hand pain:- Will get x-ray of the right hand to evaluate for nodules and arthritis.4.  Impaired fasting glucose:- Will check updated hemoglobin A1c level with blood draw.

## 2024-07-14 ENCOUNTER — Other Ambulatory Visit: Payer: Self-pay

## 2024-07-14 ENCOUNTER — Ambulatory Visit
Admission: RE | Admit: 2024-07-14 | Discharge: 2024-07-14 | Disposition: A | Discharge status: Home / Self Care | Source: Ambulatory Visit | Attending: Family Medicine | Admitting: Family Medicine

## 2024-07-14 ENCOUNTER — Ambulatory Visit
Admission: RE | Admit: 2024-07-14 | Discharge: 2024-07-14 | Disposition: A | Discharge status: Home / Self Care | Source: Ambulatory Visit

## 2024-07-14 ENCOUNTER — Ambulatory Visit: Payer: Self-pay | Admitting: Family Medicine

## 2024-07-14 DIAGNOSIS — M19041 Primary osteoarthritis, right hand: Secondary | ICD-10-CM

## 2024-07-14 DIAGNOSIS — M19042 Primary osteoarthritis, left hand: Secondary | ICD-10-CM

## 2024-07-14 DIAGNOSIS — M79642 Pain in left hand: Secondary | ICD-10-CM | POA: Insufficient documentation

## 2024-07-14 DIAGNOSIS — M2540 Effusion, unspecified joint: Secondary | ICD-10-CM

## 2024-07-14 DIAGNOSIS — M2550 Pain in unspecified joint: Secondary | ICD-10-CM

## 2024-07-14 DIAGNOSIS — M79641 Pain in right hand: Secondary | ICD-10-CM | POA: Insufficient documentation

## 2024-07-14 LAB — ANTINUCLEAR ANTIBODY SCREEN: ANA Screen: NEGATIVE

## 2024-07-14 LAB — ANTI DS-DNA AB: dsDNA Ab: 8 [IU]/mL — ABNORMAL HIGH (ref 0–4)

## 2024-07-14 LAB — LYME AB SCREEN: Lyme AB Screen: NEGATIVE

## 2024-07-14 LAB — HEMOGLOBIN A1C: Hemoglobin A1C: 5.6 % (ref <=5.6)

## 2024-07-14 LAB — RHEUMATOID FACTOR,SCREEN: Rheumatoid Factor: <8 [IU]/mL (ref <8)

## 2024-07-14 NOTE — Telephone Encounter (Signed)
 Patient came in for appt yesterday, she should be all set!

## 2024-08-14 ENCOUNTER — Ambulatory Visit: Admitting: Rheumatology

## 2024-09-08 ENCOUNTER — Encounter: Admitting: Family Medicine
